# Patient Record
Sex: Female | Born: 1955 | Race: White | Hispanic: No | Marital: Single | Smoking: Former smoker
Health system: Southern US, Community
[De-identification: ages and names within clinical notes are randomized; demographics above are authoritative.]

## PROBLEM LIST (undated history)

## (undated) DIAGNOSIS — R1310 Dysphagia, unspecified: Secondary | ICD-10-CM

## (undated) DIAGNOSIS — E039 Hypothyroidism, unspecified: Secondary | ICD-10-CM

## (undated) DIAGNOSIS — K219 Gastro-esophageal reflux disease without esophagitis: Secondary | ICD-10-CM

## (undated) DIAGNOSIS — E785 Hyperlipidemia, unspecified: Secondary | ICD-10-CM

## (undated) DIAGNOSIS — G809 Cerebral palsy, unspecified: Secondary | ICD-10-CM

## (undated) DIAGNOSIS — L309 Dermatitis, unspecified: Secondary | ICD-10-CM

## (undated) DIAGNOSIS — H409 Unspecified glaucoma: Secondary | ICD-10-CM

## (undated) DIAGNOSIS — K649 Unspecified hemorrhoids: Secondary | ICD-10-CM

## (undated) DIAGNOSIS — M1990 Unspecified osteoarthritis, unspecified site: Secondary | ICD-10-CM

## (undated) DIAGNOSIS — K5790 Diverticulosis of intestine, part unspecified, without perforation or abscess without bleeding: Secondary | ICD-10-CM

## (undated) DIAGNOSIS — D649 Anemia, unspecified: Secondary | ICD-10-CM

## (undated) DIAGNOSIS — E119 Type 2 diabetes mellitus without complications: Secondary | ICD-10-CM

## (undated) DIAGNOSIS — F329 Major depressive disorder, single episode, unspecified: Secondary | ICD-10-CM

## (undated) DIAGNOSIS — R569 Unspecified convulsions: Secondary | ICD-10-CM

## (undated) DIAGNOSIS — F32A Depression, unspecified: Secondary | ICD-10-CM

## (undated) HISTORY — PX: EYE SURGERY: SHX253

## (undated) HISTORY — PX: HERNIA REPAIR: SHX51

## (undated) HISTORY — DX: Anemia, unspecified: D64.9

---

## 1997-06-29 ENCOUNTER — Other Ambulatory Visit: Admission: RE | Admit: 1997-06-29 | Discharge: 1997-06-29 | Payer: Self-pay

## 1997-07-02 ENCOUNTER — Other Ambulatory Visit: Admission: RE | Admit: 1997-07-02 | Discharge: 1997-07-02 | Payer: Self-pay

## 1999-04-01 DIAGNOSIS — R1310 Dysphagia, unspecified: Secondary | ICD-10-CM

## 1999-04-01 DIAGNOSIS — I10 Essential (primary) hypertension: Secondary | ICD-10-CM | POA: Diagnosis present

## 1999-04-01 DIAGNOSIS — H409 Unspecified glaucoma: Secondary | ICD-10-CM | POA: Insufficient documentation

## 2004-03-27 ENCOUNTER — Emergency Department: Payer: Self-pay | Admitting: Emergency Medicine

## 2004-04-01 ENCOUNTER — Emergency Department: Payer: Self-pay | Admitting: Emergency Medicine

## 2004-06-06 ENCOUNTER — Ambulatory Visit: Payer: Self-pay | Admitting: Family Medicine

## 2005-06-11 ENCOUNTER — Ambulatory Visit: Payer: Self-pay | Admitting: Family Medicine

## 2006-06-16 ENCOUNTER — Ambulatory Visit: Payer: Self-pay | Admitting: Family Medicine

## 2006-11-09 ENCOUNTER — Ambulatory Visit: Payer: Self-pay | Admitting: Gastroenterology

## 2007-06-29 ENCOUNTER — Ambulatory Visit: Payer: Self-pay | Admitting: Family Medicine

## 2008-07-03 ENCOUNTER — Ambulatory Visit: Payer: Self-pay | Admitting: Family Medicine

## 2009-07-04 ENCOUNTER — Ambulatory Visit: Payer: Self-pay

## 2009-09-02 ENCOUNTER — Emergency Department: Payer: Self-pay | Admitting: Emergency Medicine

## 2009-09-12 ENCOUNTER — Ambulatory Visit: Payer: Self-pay

## 2009-09-13 ENCOUNTER — Ambulatory Visit: Payer: Self-pay

## 2009-09-28 ENCOUNTER — Ambulatory Visit: Payer: Self-pay | Admitting: Family

## 2009-10-18 ENCOUNTER — Ambulatory Visit: Payer: Self-pay | Admitting: Gastroenterology

## 2009-12-23 ENCOUNTER — Emergency Department: Payer: Self-pay | Admitting: Emergency Medicine

## 2010-07-10 ENCOUNTER — Ambulatory Visit: Payer: Self-pay

## 2011-07-14 ENCOUNTER — Ambulatory Visit: Payer: Self-pay | Admitting: Family Medicine

## 2011-07-22 ENCOUNTER — Ambulatory Visit: Payer: Self-pay | Admitting: Family Medicine

## 2012-01-22 ENCOUNTER — Ambulatory Visit: Payer: Self-pay | Admitting: Family Medicine

## 2012-01-28 ENCOUNTER — Ambulatory Visit: Payer: Self-pay | Admitting: Gastroenterology

## 2012-02-09 ENCOUNTER — Encounter: Payer: Self-pay | Admitting: Family Medicine

## 2012-02-29 ENCOUNTER — Encounter: Payer: Self-pay | Admitting: Family Medicine

## 2012-03-31 ENCOUNTER — Encounter: Payer: Self-pay | Admitting: Family Medicine

## 2013-01-24 ENCOUNTER — Ambulatory Visit: Payer: Self-pay | Admitting: Family Medicine

## 2013-07-07 DIAGNOSIS — N950 Postmenopausal bleeding: Secondary | ICD-10-CM | POA: Insufficient documentation

## 2013-10-17 DIAGNOSIS — F32A Depression, unspecified: Secondary | ICD-10-CM | POA: Insufficient documentation

## 2013-10-17 DIAGNOSIS — K219 Gastro-esophageal reflux disease without esophagitis: Secondary | ICD-10-CM | POA: Insufficient documentation

## 2013-10-17 DIAGNOSIS — R569 Unspecified convulsions: Secondary | ICD-10-CM | POA: Insufficient documentation

## 2013-10-17 DIAGNOSIS — F329 Major depressive disorder, single episode, unspecified: Secondary | ICD-10-CM | POA: Insufficient documentation

## 2014-01-26 ENCOUNTER — Ambulatory Visit: Payer: Self-pay | Admitting: Family Medicine

## 2014-05-08 DIAGNOSIS — E039 Hypothyroidism, unspecified: Secondary | ICD-10-CM | POA: Insufficient documentation

## 2014-11-08 DIAGNOSIS — M810 Age-related osteoporosis without current pathological fracture: Secondary | ICD-10-CM | POA: Insufficient documentation

## 2014-11-08 DIAGNOSIS — G809 Cerebral palsy, unspecified: Secondary | ICD-10-CM | POA: Insufficient documentation

## 2014-12-22 ENCOUNTER — Encounter: Payer: Self-pay | Admitting: *Deleted

## 2014-12-25 ENCOUNTER — Ambulatory Visit: Payer: Medicare Other | Admitting: Anesthesiology

## 2014-12-25 ENCOUNTER — Encounter
Admission: RE | Disposition: A | Discharge status: Home / Self Care | Payer: Self-pay | Source: Ambulatory Visit | Attending: Gastroenterology

## 2014-12-25 ENCOUNTER — Ambulatory Visit
Admission: RE | Admit: 2014-12-25 | Discharge: 2014-12-25 | Disposition: A | Discharge status: Home / Self Care | Payer: Medicare Other | Source: Ambulatory Visit | Attending: Gastroenterology | Admitting: Gastroenterology

## 2014-12-25 ENCOUNTER — Encounter: Payer: Self-pay | Admitting: *Deleted

## 2014-12-25 DIAGNOSIS — K219 Gastro-esophageal reflux disease without esophagitis: Secondary | ICD-10-CM | POA: Diagnosis not present

## 2014-12-25 DIAGNOSIS — E039 Hypothyroidism, unspecified: Secondary | ICD-10-CM | POA: Insufficient documentation

## 2014-12-25 DIAGNOSIS — M810 Age-related osteoporosis without current pathological fracture: Secondary | ICD-10-CM | POA: Insufficient documentation

## 2014-12-25 DIAGNOSIS — Z791 Long term (current) use of non-steroidal anti-inflammatories (NSAID): Secondary | ICD-10-CM | POA: Diagnosis not present

## 2014-12-25 DIAGNOSIS — F329 Major depressive disorder, single episode, unspecified: Secondary | ICD-10-CM | POA: Diagnosis not present

## 2014-12-25 DIAGNOSIS — H409 Unspecified glaucoma: Secondary | ICD-10-CM | POA: Diagnosis not present

## 2014-12-25 DIAGNOSIS — Z79899 Other long term (current) drug therapy: Secondary | ICD-10-CM | POA: Diagnosis not present

## 2014-12-25 DIAGNOSIS — Z86010 Personal history of colonic polyps: Secondary | ICD-10-CM | POA: Insufficient documentation

## 2014-12-25 DIAGNOSIS — G809 Cerebral palsy, unspecified: Secondary | ICD-10-CM | POA: Insufficient documentation

## 2014-12-25 DIAGNOSIS — K5730 Diverticulosis of large intestine without perforation or abscess without bleeding: Secondary | ICD-10-CM | POA: Insufficient documentation

## 2014-12-25 DIAGNOSIS — M8580 Other specified disorders of bone density and structure, unspecified site: Secondary | ICD-10-CM | POA: Insufficient documentation

## 2014-12-25 DIAGNOSIS — K625 Hemorrhage of anus and rectum: Secondary | ICD-10-CM | POA: Insufficient documentation

## 2014-12-25 DIAGNOSIS — K648 Other hemorrhoids: Secondary | ICD-10-CM | POA: Diagnosis not present

## 2014-12-25 HISTORY — DX: Dermatitis, unspecified: L30.9

## 2014-12-25 HISTORY — DX: Depression, unspecified: F32.A

## 2014-12-25 HISTORY — DX: Unspecified glaucoma: H40.9

## 2014-12-25 HISTORY — DX: Unspecified convulsions: R56.9

## 2014-12-25 HISTORY — PX: COLONOSCOPY WITH PROPOFOL: SHX5780

## 2014-12-25 HISTORY — DX: Dysphagia, unspecified: R13.10

## 2014-12-25 HISTORY — DX: Cerebral palsy, unspecified: G80.9

## 2014-12-25 HISTORY — DX: Hypothyroidism, unspecified: E03.9

## 2014-12-25 HISTORY — DX: Major depressive disorder, single episode, unspecified: F32.9

## 2014-12-25 HISTORY — DX: Unspecified hemorrhoids: K64.9

## 2014-12-25 HISTORY — DX: Unspecified osteoarthritis, unspecified site: M19.90

## 2014-12-25 SURGERY — COLONOSCOPY WITH PROPOFOL
Anesthesia: General

## 2014-12-25 MED ORDER — SODIUM CHLORIDE 0.9 % IV SOLN
INTRAVENOUS | Status: DC
  Administered 2014-12-25: 15:00:00 via INTRAVENOUS

## 2014-12-25 MED ORDER — LIDOCAINE HCL (PF) 1 % IJ SOLN
INTRAMUSCULAR | Status: AC
  Filled 2014-12-25: qty 2

## 2014-12-25 NOTE — Anesthesia Postprocedure Evaluation (Signed)
  Anesthesia Post-op Note  Patient: Michele James  Procedure(s) Performed: Procedure(s): COLONOSCOPY WITH PROPOFOL (N/A)  Anesthesia type:General  Patient location: PACU  Post pain: Pain level controlled  Post assessment: Post-op Vital signs reviewed, Patient's Cardiovascular Status Stable, Respiratory Function Stable, Patent Airway and No signs of Nausea or vomiting  Post vital signs: Reviewed and stable  Last Vitals:  Filed Vitals:   12/25/14 1542  BP: 141/60  Pulse: 86  Temp: 36.7 C  Resp: 22    Level of consciousness: awake, alert  and patient cooperative  Complications: No apparent anesthesia complications

## 2014-12-25 NOTE — Anesthesia Preprocedure Evaluation (Addendum)
Anesthesia Evaluation  Patient identified by MRN, date of birth, ID band Patient awake    Reviewed: Allergy & Precautions, NPO status , Patient's Chart, lab work & pertinent test results  Airway Mallampati: III  TM Distance: >3 FB Neck ROM: Limited    Dental  (+) Teeth Intact   Pulmonary    Pulmonary exam normal        Cardiovascular Exercise Tolerance: Poor Normal cardiovascular exam  Cerebral palsy.   Neuro/Psych Cerebral palsy.    GI/Hepatic Hx of rectal bleeding.   Endo/Other  Hypothyroidism   Renal/GU      Musculoskeletal  (+) Arthritis , Osteoarthritis,    Abdominal   Peds  Hematology   Anesthesia Other Findings   Reproductive/Obstetrics                            Anesthesia Physical Anesthesia Plan  ASA: III  Anesthesia Plan: General   Post-op Pain Management:    Induction: Intravenous  Airway Management Planned: Nasal Cannula  Additional Equipment:   Intra-op Plan:   Post-operative Plan:   Informed Consent: I have reviewed the patients History and Physical, chart, labs and discussed the procedure including the risks, benefits and alternatives for the proposed anesthesia with the patient or authorized representative who has indicated his/her understanding and acceptance.   Consent reviewed with POA  Plan Discussed with: CRNA  Anesthesia Plan Comments:         Anesthesia Quick Evaluation

## 2014-12-25 NOTE — Op Note (Signed)
[facility] Gastroenterology Patient Name: Michele James Procedure Date: 12/25/2014 3:04 PM MRN: [id number] Account #: [id number] Date of Birth: 30-Nov-1955 Admit Type: Outpatient Age: 59 Room: [facility] ENDO ROOM 4 Gender: Female Note Status: Finalized Procedure:         Colonoscopy Indications:       Rectal bleeding, Personal history of colonic polyps Providers:         Ezzard Standing. Bluford Kaufmann, MD Referring MD:      Teena Irani. Terance Hart, MD (Referring MD) Medicines:         Monitored Anesthesia Care Complications:     No immediate complications. Procedure:         Pre-Anesthesia Assessment:                    - Prior to the procedure, a History and Physical was                     performed, and patient medications, allergies and                     sensitivities were reviewed. The patient's tolerance of                     previous anesthesia was reviewed.                    - The risks and benefits of the procedure and the sedation                     options and risks were discussed with the patient. All                     questions were answered and informed consent was obtained.                    - After reviewing the risks and benefits, the patient was                     deemed in satisfactory condition to undergo the procedure.                    After obtaining informed consent, the colonoscope was                     passed under direct vision. Throughout the procedure, the                     patient's blood pressure, pulse, and oxygen saturations                     were monitored continuously. The Colonoscope was                     introduced through the anus and advanced to the the cecum,                     identified by appendiceal orifice and ileocecal valve. The                     colonoscopy was performed with difficulty due to                     significant looping. Successful completion of the  procedure was aided by using manual pressure.  The patient                     tolerated the procedure well. The quality of the bowel                     preparation was fair. Findings:      Multiple small-mouthed diverticula were found in the sigmoid colon.      The exam was otherwise without abnormality.      The perianal exam findings include non-thrombosed internal hemorrhoids. Impression:        - Diverticulosis in the sigmoid colon.                    - The examination was otherwise normal.                    - Non-thrombosed internal hemorrhoids found on perianal                     exam.                    - No specimens collected. Recommendation:    - Discharge patient to home.                    - High fiber diet daily.                    - The findings and recommendations were discussed with the                     patient.                    - Repeat colonoscopy in 5 years for surveillance.                    - Anusol suppository prn for rectal bleeding. Procedure Code(s): --- Professional ---                    [phone], Colonoscopy, flexible; diagnostic, including                     collection of specimen(s) by brushing or washing, when                     performed (separate procedure) Diagnosis Code(s): --- Professional ---                    K64.8, Other hemorrhoids                    K62.5, Hemorrhage of anus and rectum                    Z86.010, Personal history of colonic polyps                    K57.30, Diverticulosis of large intestine without                     perforation or abscess without bleeding CPT copyright 2014 [employer] Association. All rights reserved. The codes documented in this report are preliminary and upon coder review may  be revised to meet current compliance requirements. Wallace Cullens, MD 12/25/2014 3:36:10 PM This report has been signed electronically. Number of Addenda: 0 Note  Initiated On: 12/25/2014 3:04 PM Scope Withdrawal Time: 0 hours 8 minutes 12 seconds  Total Procedure  Duration: 0 hours 22 minutes 17 seconds       [facility]

## 2014-12-25 NOTE — H&P (Signed)
  Date of Initial H&P:12/18/2014 History reviewed, patient examined, no change in status, stable for surgery. 

## 2014-12-25 NOTE — Transfer of Care (Signed)
Immediate Anesthesia Transfer of Care Note  Patient: Michele James  Procedure(s) Performed: Procedure(s): COLONOSCOPY WITH PROPOFOL (N/A)  Patient Location: PACU and Endoscopy Unit  Anesthesia Type:General  Level of Consciousness: awake, alert  and oriented  Airway & Oxygen Therapy: Patient Spontanous Breathing and Patient connected to nasal cannula oxygen  Post-op Assessment: Report given to RN and Post -op Vital signs reviewed and stable  Post vital signs: Reviewed and stable  Last Vitals:  Filed Vitals:   12/25/14 1542  BP: 141/60  Pulse: 86  Temp: 36.7 C  Resp: 22    Complications: No apparent anesthesia complications

## 2014-12-26 ENCOUNTER — Other Ambulatory Visit: Payer: Self-pay | Admitting: Family Medicine

## 2014-12-26 ENCOUNTER — Encounter: Payer: Self-pay | Admitting: Gastroenterology

## 2014-12-26 DIAGNOSIS — Z1231 Encounter for screening mammogram for malignant neoplasm of breast: Secondary | ICD-10-CM

## 2015-01-29 ENCOUNTER — Ambulatory Visit
Admission: RE | Admit: 2015-01-29 | Discharge: 2015-01-29 | Disposition: A | Discharge status: Home / Self Care | Payer: Medicare Other | Source: Ambulatory Visit | Attending: Family Medicine | Admitting: Family Medicine

## 2015-01-29 DIAGNOSIS — Z1231 Encounter for screening mammogram for malignant neoplasm of breast: Secondary | ICD-10-CM | POA: Insufficient documentation

## 2015-06-27 ENCOUNTER — Other Ambulatory Visit: Payer: Self-pay | Admitting: Obstetrics and Gynecology

## 2015-06-27 DIAGNOSIS — Z1231 Encounter for screening mammogram for malignant neoplasm of breast: Secondary | ICD-10-CM

## 2015-10-22 ENCOUNTER — Other Ambulatory Visit: Payer: Self-pay | Admitting: Gastroenterology

## 2015-10-22 DIAGNOSIS — R1011 Right upper quadrant pain: Secondary | ICD-10-CM

## 2015-10-24 ENCOUNTER — Ambulatory Visit
Admission: RE | Admit: 2015-10-24 | Discharge: 2015-10-24 | Disposition: A | Discharge status: Home / Self Care | Payer: Medicare Other | Source: Ambulatory Visit | Attending: Gastroenterology | Admitting: Gastroenterology

## 2015-10-24 DIAGNOSIS — K8020 Calculus of gallbladder without cholecystitis without obstruction: Secondary | ICD-10-CM | POA: Insufficient documentation

## 2015-10-24 DIAGNOSIS — R1011 Right upper quadrant pain: Secondary | ICD-10-CM | POA: Diagnosis present

## 2015-11-07 ENCOUNTER — Encounter: Payer: Self-pay | Admitting: *Deleted

## 2015-11-15 ENCOUNTER — Emergency Department: Payer: Medicare Other

## 2015-11-15 ENCOUNTER — Encounter: Payer: Self-pay | Admitting: Emergency Medicine

## 2015-11-15 ENCOUNTER — Inpatient Hospital Stay
Admission: EM | Admit: 2015-11-15 | Discharge: 2015-11-20 | DRG: 419 | Disposition: A | Discharge status: Home / Self Care | Payer: Medicare Other | Attending: Surgery | Admitting: Surgery

## 2015-11-15 ENCOUNTER — Inpatient Hospital Stay: Payer: Medicare Other

## 2015-11-15 DIAGNOSIS — R17 Unspecified jaundice: Secondary | ICD-10-CM

## 2015-11-15 DIAGNOSIS — G809 Cerebral palsy, unspecified: Secondary | ICD-10-CM | POA: Diagnosis present

## 2015-11-15 DIAGNOSIS — Z419 Encounter for procedure for purposes other than remedying health state, unspecified: Secondary | ICD-10-CM

## 2015-11-15 DIAGNOSIS — R1310 Dysphagia, unspecified: Secondary | ICD-10-CM | POA: Diagnosis present

## 2015-11-15 DIAGNOSIS — K8050 Calculus of bile duct without cholangitis or cholecystitis without obstruction: Secondary | ICD-10-CM

## 2015-11-15 DIAGNOSIS — K838 Other specified diseases of biliary tract: Secondary | ICD-10-CM | POA: Diagnosis not present

## 2015-11-15 DIAGNOSIS — M1990 Unspecified osteoarthritis, unspecified site: Secondary | ICD-10-CM | POA: Diagnosis present

## 2015-11-15 DIAGNOSIS — K8060 Calculus of gallbladder and bile duct with cholecystitis, unspecified, without obstruction: Secondary | ICD-10-CM | POA: Diagnosis present

## 2015-11-15 DIAGNOSIS — Z7983 Long term (current) use of bisphosphonates: Secondary | ICD-10-CM | POA: Diagnosis not present

## 2015-11-15 DIAGNOSIS — R1011 Right upper quadrant pain: Secondary | ICD-10-CM | POA: Diagnosis not present

## 2015-11-15 DIAGNOSIS — R569 Unspecified convulsions: Secondary | ICD-10-CM | POA: Diagnosis present

## 2015-11-15 DIAGNOSIS — Z888 Allergy status to other drugs, medicaments and biological substances status: Secondary | ICD-10-CM

## 2015-11-15 DIAGNOSIS — Z79899 Other long term (current) drug therapy: Secondary | ICD-10-CM | POA: Diagnosis not present

## 2015-11-15 DIAGNOSIS — K8010 Calculus of gallbladder with chronic cholecystitis without obstruction: Secondary | ICD-10-CM | POA: Diagnosis not present

## 2015-11-15 DIAGNOSIS — F329 Major depressive disorder, single episode, unspecified: Secondary | ICD-10-CM | POA: Diagnosis present

## 2015-11-15 DIAGNOSIS — K649 Unspecified hemorrhoids: Secondary | ICD-10-CM | POA: Diagnosis present

## 2015-11-15 DIAGNOSIS — Z882 Allergy status to sulfonamides status: Secondary | ICD-10-CM

## 2015-11-15 DIAGNOSIS — K439 Ventral hernia without obstruction or gangrene: Secondary | ICD-10-CM | POA: Diagnosis present

## 2015-11-15 DIAGNOSIS — R51 Headache: Secondary | ICD-10-CM | POA: Diagnosis present

## 2015-11-15 DIAGNOSIS — K8001 Calculus of gallbladder with acute cholecystitis with obstruction: Secondary | ICD-10-CM

## 2015-11-15 DIAGNOSIS — E039 Hypothyroidism, unspecified: Secondary | ICD-10-CM | POA: Diagnosis present

## 2015-11-15 LAB — CBC
HEMATOCRIT: 28.7 % — AB (ref 35.0–47.0)
Hemoglobin: 10.1 g/dL — ABNORMAL LOW (ref 12.0–16.0)
MCH: 31.9 pg (ref 26.0–34.0)
MCHC: 35.2 g/dL (ref 32.0–36.0)
MCV: 90.6 fL (ref 80.0–100.0)
PLATELETS: 321 10*3/uL (ref 150–440)
RBC: 3.17 MIL/uL — AB (ref 3.80–5.20)
RDW: 13.5 % (ref 11.5–14.5)
WBC: 8.8 10*3/uL (ref 3.6–11.0)

## 2015-11-15 LAB — CBC WITH DIFFERENTIAL/PLATELET
Basophils Absolute: 0 10*3/uL (ref 0–0.1)
Basophils Relative: 0 %
EOS PCT: 1 %
Eosinophils Absolute: 0.1 10*3/uL (ref 0–0.7)
HEMATOCRIT: 31.5 % — AB (ref 35.0–47.0)
HEMOGLOBIN: 11.2 g/dL — AB (ref 12.0–16.0)
LYMPHS ABS: 0.7 10*3/uL — AB (ref 1.0–3.6)
LYMPHS PCT: 8 %
MCH: 32 pg (ref 26.0–34.0)
MCHC: 35.5 g/dL (ref 32.0–36.0)
MCV: 90.3 fL (ref 80.0–100.0)
Monocytes Absolute: 0.9 10*3/uL (ref 0.2–0.9)
Monocytes Relative: 11 %
NEUTROS ABS: 6.9 10*3/uL — AB (ref 1.4–6.5)
Neutrophils Relative %: 80 %
Platelets: 366 10*3/uL (ref 150–440)
RBC: 3.49 MIL/uL — ABNORMAL LOW (ref 3.80–5.20)
RDW: 13.5 % (ref 11.5–14.5)
WBC: 8.6 10*3/uL (ref 3.6–11.0)

## 2015-11-15 LAB — CREATININE, SERUM: Creatinine, Ser: 0.4 mg/dL — ABNORMAL LOW (ref 0.44–1.00)

## 2015-11-15 LAB — LIPASE, BLOOD: Lipase: 23 U/L (ref 11–51)

## 2015-11-15 LAB — COMPREHENSIVE METABOLIC PANEL
ALBUMIN: 3 g/dL — AB (ref 3.5–5.0)
ALK PHOS: 197 U/L — AB (ref 38–126)
ALT: 359 U/L — ABNORMAL HIGH (ref 14–54)
ANION GAP: 9 (ref 5–15)
AST: 496 U/L — ABNORMAL HIGH (ref 15–41)
BILIRUBIN TOTAL: 2.5 mg/dL — AB (ref 0.3–1.2)
BUN: 8 mg/dL (ref 6–20)
CALCIUM: 8.4 mg/dL — AB (ref 8.9–10.3)
CO2: 27 mmol/L (ref 22–32)
Chloride: 101 mmol/L (ref 101–111)
Creatinine, Ser: 0.42 mg/dL — ABNORMAL LOW (ref 0.44–1.00)
GFR calc non Af Amer: >60 mL/min (ref >60)
GLUCOSE: 149 mg/dL — AB (ref 65–99)
POTASSIUM: 3.1 mmol/L — AB (ref 3.5–5.1)
SODIUM: 137 mmol/L (ref 135–145)
TOTAL PROTEIN: 7.3 g/dL (ref 6.5–8.1)

## 2015-11-15 LAB — SURGICAL PCR SCREEN
MRSA, PCR: NEGATIVE
Staphylococcus aureus: NEGATIVE

## 2015-11-15 MED ORDER — LEVOTHYROXINE SODIUM 125 MCG PO TABS
125.0000 ug | ORAL_TABLET | Freq: Every day | ORAL | Status: DC
  Administered 2015-11-16 – 2015-11-20 (×5): 125 ug via ORAL
  Filled 2015-11-15 (×2): qty 1
  Filled 2015-11-15: qty 2.5
  Filled 2015-11-15 (×4): qty 1

## 2015-11-15 MED ORDER — IOPAMIDOL (ISOVUE-300) INJECTION 61%
100.0000 mL | Freq: Once | INTRAVENOUS | Status: AC | PRN
Start: 2015-11-15 — End: 2015-11-15
  Administered 2015-11-15: 100 mL via INTRAVENOUS

## 2015-11-15 MED ORDER — HYDROCODONE-ACETAMINOPHEN 5-325 MG PO TABS
1.0000 | ORAL_TABLET | ORAL | Status: DC | PRN
  Administered 2015-11-15: 1 via ORAL
  Administered 2015-11-17: 2 via ORAL
  Filled 2015-11-15: qty 1
  Filled 2015-11-15: qty 2
  Filled 2015-11-15: qty 1

## 2015-11-15 MED ORDER — SODIUM CHLORIDE 0.9 % IV BOLUS (SEPSIS)
500.0000 mL | Freq: Once | INTRAVENOUS | Status: AC
  Administered 2015-11-15: 500 mL via INTRAVENOUS

## 2015-11-15 MED ORDER — KCL IN DEXTROSE-NACL 40-5-0.45 MEQ/L-%-% IV SOLN
INTRAVENOUS | Status: DC
  Administered 2015-11-15 – 2015-11-18 (×5): via INTRAVENOUS
  Administered 2015-11-18: 900 mL via INTRAVENOUS
  Administered 2015-11-19: 12:00:00 via INTRAVENOUS
  Filled 2015-11-15 (×10): qty 1000

## 2015-11-15 MED ORDER — CARBAMAZEPINE 200 MG PO TABS
ORAL_TABLET | ORAL | Status: AC
  Administered 2015-11-15: 200 mg via ORAL
  Filled 2015-11-15: qty 1

## 2015-11-15 MED ORDER — ENOXAPARIN SODIUM 40 MG/0.4ML ~~LOC~~ SOLN
40.0000 mg | SUBCUTANEOUS | Status: DC
  Administered 2015-11-15 – 2015-11-19 (×4): 40 mg via SUBCUTANEOUS
  Filled 2015-11-15 (×4): qty 0.4

## 2015-11-15 MED ORDER — LORATADINE 10 MG PO TABS
ORAL_TABLET | ORAL | Status: AC
  Administered 2015-11-15: 10 mg via ORAL
  Filled 2015-11-15: qty 1

## 2015-11-15 MED ORDER — PANTOPRAZOLE SODIUM 40 MG PO TBEC
40.0000 mg | DELAYED_RELEASE_TABLET | Freq: Two times a day (BID) | ORAL | Status: DC
  Administered 2015-11-15 – 2015-11-20 (×10): 40 mg via ORAL
  Filled 2015-11-15 (×10): qty 1

## 2015-11-15 MED ORDER — PIPERACILLIN-TAZOBACTAM 3.375 G IVPB 30 MIN
3.3750 g | Freq: Once | INTRAVENOUS | Status: AC
  Administered 2015-11-15: 3.375 g via INTRAVENOUS
  Filled 2015-11-15: qty 50

## 2015-11-15 MED ORDER — CARBAMAZEPINE 200 MG PO TABS
200.0000 mg | ORAL_TABLET | Freq: Three times a day (TID) | ORAL | Status: DC
  Administered 2015-11-15 – 2015-11-20 (×14): 200 mg via ORAL
  Filled 2015-11-15 (×13): qty 1

## 2015-11-15 MED ORDER — MORPHINE SULFATE (PF) 4 MG/ML IV SOLN
4.0000 mg | INTRAVENOUS | Status: DC | PRN
Start: 2015-11-15 — End: 2015-11-15
  Administered 2015-11-15: 4 mg via INTRAVENOUS
  Filled 2015-11-15: qty 1

## 2015-11-15 MED ORDER — ACETAMINOPHEN 500 MG PO TABS
500.0000 mg | ORAL_TABLET | Freq: Four times a day (QID) | ORAL | Status: DC | PRN
  Administered 2015-11-15: 500 mg via ORAL

## 2015-11-15 MED ORDER — PROMETHAZINE HCL 25 MG/ML IJ SOLN
12.5000 mg | Freq: Once | INTRAMUSCULAR | Status: AC
  Administered 2015-11-15: 12.5 mg via INTRAVENOUS
  Filled 2015-11-15: qty 1

## 2015-11-15 MED ORDER — LORATADINE 10 MG PO TABS
10.0000 mg | ORAL_TABLET | Freq: Every day | ORAL | Status: DC
  Administered 2015-11-15 – 2015-11-20 (×5): 10 mg via ORAL
  Filled 2015-11-15 (×4): qty 1

## 2015-11-15 MED ORDER — ALPRAZOLAM 0.25 MG PO TABS
0.2500 mg | ORAL_TABLET | Freq: Three times a day (TID) | ORAL | Status: DC | PRN
  Filled 2015-11-15: qty 1

## 2015-11-15 MED ORDER — DIATRIZOATE MEGLUMINE & SODIUM 66-10 % PO SOLN
15.0000 mL | ORAL | Status: AC
  Administered 2015-11-15 (×2): 15 mL via ORAL

## 2015-11-15 MED ORDER — PIPERACILLIN-TAZOBACTAM 3.375 G IVPB
3.3750 g | Freq: Three times a day (TID) | INTRAVENOUS | Status: DC
  Administered 2015-11-15 – 2015-11-20 (×14): 3.375 g via INTRAVENOUS
  Filled 2015-11-15 (×19): qty 50

## 2015-11-15 MED ORDER — ALENDRONATE SODIUM 70 MG PO TABS: 70.0000 mg | ORAL_TABLET | ORAL | Status: DC

## 2015-11-15 MED ORDER — ACETAMINOPHEN 500 MG PO TABS
ORAL_TABLET | ORAL | Status: AC
  Administered 2015-11-15: 500 mg via ORAL
  Filled 2015-11-15: qty 1

## 2015-11-15 MED ORDER — ONDANSETRON 4 MG PO TBDP: 4.0000 mg | ORAL_TABLET | Freq: Four times a day (QID) | ORAL | Status: DC | PRN

## 2015-11-15 MED ORDER — CALCIUM CITRATE-VITAMIN D 500-400 MG-UNIT PO CHEW
1.0000 | CHEWABLE_TABLET | Freq: Two times a day (BID) | ORAL | Status: DC
  Administered 2015-11-15 – 2015-11-20 (×10): 1 via ORAL
  Filled 2015-11-15 (×11): qty 1

## 2015-11-15 MED ORDER — PAROXETINE HCL 20 MG PO TABS
20.0000 mg | ORAL_TABLET | Freq: Every day | ORAL | Status: DC
  Administered 2015-11-15 – 2015-11-20 (×5): 20 mg via ORAL
  Filled 2015-11-15 (×5): qty 1

## 2015-11-15 MED ORDER — OLANZAPINE 5 MG PO TABS
5.0000 mg | ORAL_TABLET | Freq: Every day | ORAL | Status: DC
  Administered 2015-11-15 – 2015-11-19 (×5): 5 mg via ORAL
  Filled 2015-11-15 (×5): qty 1

## 2015-11-15 MED ORDER — MORPHINE SULFATE (PF) 4 MG/ML IV SOLN
4.0000 mg | INTRAVENOUS | Status: DC | PRN
  Administered 2015-11-16 – 2015-11-17 (×3): 4 mg via INTRAVENOUS
  Filled 2015-11-15 (×3): qty 1

## 2015-11-15 MED ORDER — ONDANSETRON HCL 4 MG/2ML IJ SOLN
4.0000 mg | Freq: Four times a day (QID) | INTRAMUSCULAR | Status: DC | PRN
  Administered 2015-11-18: 4 mg via INTRAVENOUS
  Filled 2015-11-15: qty 2

## 2015-11-15 NOTE — ED Notes (Signed)
Took pt to bathroom

## 2015-11-15 NOTE — ED Triage Notes (Addendum)
C/O RUQ pain.  Patient has had an ultrasound of gallbladder and patient states she needs to have her gallbladder removed.  Patient has been referred to Dr. Katrinka BlazingSmith, but does not have an appointment scheduled.  Patient states RUQ abdominal pain worsened yesterday and pain is worse when walking with crutches and laying on right side.  C/O intermittent nausea.  Denies vomiting.  Patient states surgery has been scheduled for September 6th.

## 2015-11-15 NOTE — ED Notes (Signed)
Patient transported to Ultrasound 

## 2015-11-15 NOTE — H&P (Signed)
Michele James is a 60 y.o. female  with gallbladder disease.  HPI: She is very pleasant woman with history of cerebral palsy. She's noticed some increasing abdominal pain and indigestion over the last several months. She was evaluated by the gastroenterology service who recommended further intervention with ultrasound. Ultrasound a month ago demonstrated gallstones without evidence of acute cholecystitis and a normal size common bile duct. She had normal liver function studies at that time. Her symptoms have increased over the last 24 hours. She recently had been set up to see the general surgeons for follow-up. She came to the emergency room because of her persistent symptoms. Levophed values currently reveal normal white blood cell count but a 2.5 and elevated liver transaminases significantly different from her previous evaluation. Gallbladder ultrasound today reveals gallbladder wall thickening consistent with possible acute cholecystitis. The surgical service was consulted.  She denies any history of significant stomach problems in the past other than abdominal wall hernia. She had her umbilicus removed and a hernia repaired when she was 60 years old. She does complain of a recurrent/persistent abdominal wall hernia. Specifically, she denies any history of hepatitis, yellow jaundice, pancreatitis, peptic ulcer disease, or diverticulitis. She's had no previous abdominal surgery other than the hernia repair. She denies history of cardiac disease hypertension or diabetes. She does have history of hypothyroidism currently on therapy. She has severe arthritis and has difficulties with her ambulation requiring crutches.  Past Medical History:  Diagnosis Date  . Arthritis   . Cerebral palsy (HCC)   . Depression   . Dysphagia   . Eczema   . Glaucoma   . Hemorrhoids   . Hypothyroidism   . Seizures (HCC)    Past Surgical History:  Procedure Laterality Date  . COLONOSCOPY WITH PROPOFOL N/A 12/25/2014    Procedure: COLONOSCOPY WITH PROPOFOL;  Surgeon: Wallace CullensPaul Y Oh, MD;  [facility];  Service: Gastroenterology;  Laterality: N/A;  . HERNIA REPAIR     Social History   Social History  . Marital status: Single    Spouse name: N/A  . Number of children: N/A  . Years of education: N/A   Social History Main Topics  . Smoking status: Never Smoker  . Smokeless tobacco: Never Used  . Alcohol use No  . Drug use: No  . Sexual activity: Not Asked   Other Topics Concern  . None   Social History Narrative  . None     Review of Systems  Constitutional: Negative for chills, fever and weight loss.  HENT: Negative for tinnitus.   Eyes: Negative.   Respiratory: Negative for cough, shortness of breath and wheezing.   Cardiovascular: Positive for chest pain and palpitations.  Gastrointestinal: Positive for abdominal pain, heartburn, nausea and vomiting. Negative for constipation and diarrhea.  Genitourinary: Negative.   Musculoskeletal: Negative.   Skin: Negative.   Neurological: Positive for focal weakness and headaches.  Psychiatric/Behavioral: Negative.      PHYSICAL EXAM: BP (!) 150/75   Pulse 91   Temp 98.9 F (37.2 C) (Oral)   Resp (!) 24   Ht 5\' 3"  (1.6 m)   Wt 75.3 kg (166 lb)   SpO2 95%   BMI 29.41 kg/m   Physical Exam  Constitutional: She is oriented to person, place, and time. She appears well-developed.  HENT:  Head: Normocephalic and atraumatic.  Eyes: EOM are normal. Pupils are equal, round, and reactive to light.  Neck: Normal range of motion. Neck supple.  Cardiovascular: Normal rate  and regular rhythm.   Pulmonary/Chest: Effort normal and breath sounds normal. No respiratory distress. She has no wheezes.  Abdominal: Soft. There is tenderness. There is guarding.  Musculoskeletal: She exhibits edema and deformity.  She has some decreased range of motion in her left arm and left leg.  Neurological: She is alert and oriented to person, place, and time.   Skin: Skin is warm and dry. She is not diaphoretic.  Psychiatric: She has a normal mood and affect. Her behavior is normal.   Her abdomen is mildly tender right upper quadrant. She appears to have a midline hernia above the side of her previous repair. She has some mild guarding but no rebound. She does have active bowel sounds.  Impression/Plan: This woman appears to have symptomatic biliary tract disease and likely has acute cholecystitis. We will start her on IV antibiotics and IV rehydration. With normal size common bile duct I doubt she has any ductal obstruction. I am concerned about this abdominal wall hernia and her history is a little vague. Equal range of motion with CT scan to evaluate the abdominal wall hernia as we may be of repair and simultaneously. We'll plan admission to hospital and possible surgical intervention tomorrow. This plans been discussed with the patient in detail and she is in agreement.   Tiney Rougealph Ely III, MD  11/15/2015, 10:57 AM

## 2015-11-15 NOTE — ED Provider Notes (Signed)
[facility] Emergency Department Provider Note    First MD Initiated Contact with Patient 11/15/15 [phone]     (approximate)  I have reviewed the triage vital signs and the nursing notes.   HISTORY  Chief Complaint Abdominal Pain    HPI Michele James is a 60 y.o. female who presents with several weeks of intermittent right upper quadrant abdominal pain. Patient describes it as a colicky cramping pain that radiates through to her right back. States that she was seen at her PCPs office today and had a right upper quadrant ultrasound was performed. The ultrasound on review of chart shows no acute abnormalities. Patient was referred to outpatient surgery but doesn't have an appointment until September 6. She denies any fevers, does have intermittent nausea but has not had any vomiting. Denies any dysuria or flank pain. No pelvic pain. As a chest pain or shortness of breath.   Past Medical History:  Diagnosis Date  . Arthritis   . Cerebral palsy (HCC)   . Depression   . Dysphagia   . Eczema   . Glaucoma   . Hemorrhoids   . Hypothyroidism   . Seizures [facility](HCC)     Patient Active Problem List   Diagnosis Date Noted  . RUQ pain     Past Surgical History:  Procedure Laterality Date  . COLONOSCOPY WITH PROPOFOL N/A 12/25/2014   Procedure: COLONOSCOPY WITH PROPOFOL;  Surgeon: Wallace CullensPaul Y Oh, MD;  [facility];  Service: Gastroenterology;  Laterality: N/A;  . HERNIA REPAIR      Prior to Admission medications   Medication Sig Start Date End Date Taking? Authorizing Provider  acetaminophen (TYLENOL) 325 MG tablet Take 325 mg by mouth every 6 (six) hours as needed.    Historical Provider, MD  alendronate (FOSAMAX) 70 MG tablet Take 70 mg by mouth once a week. Take with a full glass of water on an empty stomach.    Historical Provider, MD  ALPRAZolam Prudy Feeler(XANAX) 0.25 MG tablet Take 0.25 mg by mouth 3 (three) times daily as needed for anxiety.    Historical  Provider, MD  calcium citrate-vitamin D (CITRACAL+D) 315-200 MG-UNIT per tablet Take 1 tablet by mouth 2 (two) times daily.    Historical Provider, MD  carbamazepine (TEGRETOL) 200 MG tablet Take 200 mg by mouth 3 (three) times daily.    Historical Provider, MD  ergocalciferol (VITAMIN D2) 50000 UNITS capsule Take 50,000 Units by mouth once a week.    Historical Provider, MD  esomeprazole (NEXIUM) 40 MG capsule Take 40 mg by mouth daily at 12 noon.    Historical Provider, MD  fexofenadine (ALLEGRA) 180 MG tablet Take 180 mg by mouth daily.    Historical Provider, MD  hydrocortisone (ANUSOL-HC) 25 MG suppository Place 25 mg rectally 2 (two) times daily as needed for hemorrhoids or itching.    Historical Provider, MD  levothyroxine (SYNTHROID, LEVOTHROID) 125 MCG tablet Take 125 mcg by mouth daily before breakfast.    Historical Provider, MD  naproxen (NAPROSYN) 375 MG tablet Take 375 mg by mouth 2 (two) times daily with a meal.    Historical Provider, MD  OLANZapine (ZYPREXA) 5 MG tablet Take 5 mg by mouth at bedtime.    Historical Provider, MD  PARoxetine (PAXIL) 20 MG tablet Take 20 mg by mouth daily.    Historical Provider, MD  potassium chloride 20 MEQ/15ML (10%) SOLN Take 10 mEq by mouth daily.    Historical Provider, MD  ranitidine (ZANTAC)  300 MG tablet Take 300 mg by mouth at bedtime.    Historical Provider, MD  SUMAtriptan (IMITREX) 50 MG tablet Take 50 mg by mouth every 2 (two) hours as needed for migraine. May repeat in 2 hours if headache persists or recurs.    Historical Provider, MD  traMADol (ULTRAM) 50 MG tablet Take 50 mg by mouth every 6 (six) hours as needed. 1-2 tablets by mouth every 4-6 hours as needed for pain    Historical Provider, MD    Allergies Advil [ibuprofen]; Potassium-containing compounds; Theophyllines; Amoxicillin; Nsaids; Septra [sulfamethoxazole-trimethoprim]; and Sulfa antibiotics  Family History  Problem Relation Age of Onset  . Breast cancer Maternal Aunt    . Breast cancer Maternal Aunt     Social History Social History  Substance Use Topics  . Smoking status: Never Smoker  . Smokeless tobacco: Never Used  . Alcohol use No    Review of Systems Patient denies headaches, rhinorrhea, blurry vision, numbness, shortness of breath, chest pain, edema, cough, abdominal pain, nausea, vomiting, diarrhea, dysuria, fevers, rashes or hallucinations unless otherwise stated above in HPI. ____________________________________________   PHYSICAL EXAM:  VITAL SIGNS: Vitals:   11/15/15 1015 11/15/15 1030  BP:  (!) 150/75  Pulse: 90 91  Resp: 19 (!) 24  Temp:      Constitutional: Alert and oriented. Well appearing and in no acute distress. Eyes: Conjunctivae are normal. PERRL. EOMI. Head: Atraumatic. Nose: No congestion/rhinnorhea. Mouth/Throat: Mucous membranes are moist.  Oropharynx non-erythematous. Neck: No stridor. Painless ROM. No cervical spine tenderness to palpation Hematological/Lymphatic/Immunilogical: No cervical lymphadenopathy. Cardiovascular: Normal rate, regular rhythm. Grossly normal heart sounds.  Good peripheral circulation. Respiratory: Normal respiratory effort.  No retractions. Lungs CTAB. Gastrointestinal: Soft, mildly tender in the RUQ without guarding or rebound. No distention. No abdominal bruits. No CVA tenderness. Genitourinary:  Musculoskeletal: No lower extremity tenderness nor edema.  No joint effusions. Neurologic:  Normal speech and language. No gross focal neurologic deficits are appreciated. No gait instability. Skin:  Skin is warm, dry and intact. No rash noted. Psychiatric: Mood and affect are normal. Speech and behavior are normal.  ____________________________________________   LABS (all labs ordered are listed, but only abnormal results are displayed)  Results for orders placed or performed during the hospital encounter of 11/15/15 (from the past 24 hour(s))  CBC with Differential/Platelet      Status: Abnormal   Collection Time: 11/15/15  8:11 AM  Result Value Ref Range   WBC 8.6 3.6 - 11.0 K/uL   RBC 3.49 (L) 3.80 - 5.20 MIL/uL   Hemoglobin 11.2 (L) 12.0 - 16.0 g/dL   HCT 16.131.5 (L) 09.635.0 - 04.547.0 %   MCV 90.3 80.0 - 100.0 fL   MCH 32.0 26.0 - 34.0 pg   MCHC 35.5 32.0 - 36.0 g/dL   RDW 40.913.5 81.111.5 - 91.414.5 %   Platelets 366 150 - 440 K/uL   Neutrophils Relative % 80 %   Neutro Abs 6.9 (H) 1.4 - 6.5 K/uL   Lymphocytes Relative 8 %   Lymphs Abs 0.7 (L) 1.0 - 3.6 K/uL   Monocytes Relative 11 %   Monocytes Absolute 0.9 0.2 - 0.9 K/uL   Eosinophils Relative 1 %   Eosinophils Absolute 0.1 0 - 0.7 K/uL   Basophils Relative 0 %   Basophils Absolute 0.0 0 - 0.1 K/uL  Comprehensive metabolic panel     Status: Abnormal   Collection Time: 11/15/15  8:11 AM  Result Value Ref Range   Sodium 137  135 - 145 mmol/L   Potassium 3.1 (L) 3.5 - 5.1 mmol/L   Chloride 101 101 - 111 mmol/L   CO2 27 22 - 32 mmol/L   Glucose, Bld 149 (H) 65 - 99 mg/dL   BUN 8 6 - 20 mg/dL   Creatinine, Ser 4.69 (L) 0.44 - 1.00 mg/dL   Calcium 8.4 (L) 8.9 - 10.3 mg/dL   Total Protein 7.3 6.5 - 8.1 g/dL   Albumin 3.0 (L) 3.5 - 5.0 g/dL   AST 629 (H) 15 - 41 U/L   ALT 359 (H) 14 - 54 U/L   Alkaline Phosphatase 197 (H) 38 - 126 U/L   Total Bilirubin 2.5 (H) 0.3 - 1.2 mg/dL   GFR calc non Af Amer >60 >60 mL/min   GFR calc Af Amer >60 >60 mL/min   Anion gap 9 5 - 15  Lipase, blood     Status: None   Collection Time: 11/15/15  8:11 AM  Result Value Ref Range   Lipase 23 11 - 51 U/L   ____________________________________________   ____________________________________________  RADIOLOGY  RUS [place] with gall stones and increased gall bladder wall thickness. ____________________________________________   PROCEDURES  Procedure(s) performed: none    Critical Care performed: no ____________________________________________   INITIAL IMPRESSION / ASSESSMENT AND PLAN / ED COURSE  Pertinent labs & imaging  results that were available during my care of the patient were reviewed by me and considered in my medical decision making (see chart for details).  DDX cholelithiasis, cholecystitis, biliary colic, pancreatitis, gastritis, pneumonia, constipation  Michele James is a 60 y.o. who presents to the ED with acute on chronic right upper quadrant pain. Patient afebrile but is tender to palpation right upper quadrant. She does have a history of stones therefore given her acute presentation will repeat right upper quadrant ultrasound to evaluate for acute biliary pathology. Will obtain IV access for laboratory evaluation of hepatic function and biliary processes. We will order x-ray acute abdominal series to evaluate for obstructive process.  Clinical Course  Comment By Time  Reviewed results of x-ray imaging. Also noted ultrasound with slightly increased gallbladder wall thickening as compared to previous. No pericholecystic fluid. No fever and no leukocytosis. Willy Eddy, MD 08/17 1008  CMP does show increasing T bili with transaminitis. Ace on these levels with worsening pain and ultrasound evidence of increasing gallbladder wall thickening I paged Gen. surgery further evaluation. Spoke with Dr. Theone Murdoch who agrees to come and evaluate patient. Willy Eddy, MD 08/17 1012   ----------------------------------------- 11:36 AM on 11/15/2015 -----------------------------------------  Patient evaluate by Dr. Theone Murdoch who agrees that patient is symptomatically from her cholelithiasis and given the increasing wall thickness has likely acute cholecystitis. Plan for admission for IV antibiotics and cholecystectomy.  Have discussed with the patient and available family all diagnostics and treatments performed thus far and all questions were answered to the best of my ability. The patient demonstrates understanding and agreement with plan.   ____________________________________________   FINAL CLINICAL  IMPRESSION(S) / ED DIAGNOSES  Final diagnoses:  RUQ pain  Calculus of gallbladder with acute cholecystitis and obstruction      NEW MEDICATIONS STARTED DURING THIS VISIT:  New Prescriptions   No medications on file     Note:  This document was prepared using Dragon voice recognition software and may include unintentional dictation errors.    Willy Eddy, MD 11/15/15 1137

## 2015-11-15 NOTE — ED Notes (Signed)
Pt assisted x2 via wheelchair to the toilet and back on the stretcher and positioned for comfort..Marland Kitchen

## 2015-11-15 NOTE — ED Notes (Signed)
Assisted patient to bathroom.

## 2015-11-15 NOTE — ED Notes (Signed)
Pt returned from ultrasound

## 2015-11-15 NOTE — ED Notes (Signed)
Surgeon Dr. Michela PitcherEly at bedside.

## 2015-11-15 NOTE — ED Notes (Signed)
Pt c/o increased RUQ PAIN for the past 3 days with nausea.. Pt has known gall bladder issues and has an appointment with a surgeon on September 6th..Marland Kitchen

## 2015-11-15 NOTE — Progress Notes (Signed)

## 2015-11-16 ENCOUNTER — Inpatient Hospital Stay: Payer: Medicare Other | Admitting: Certified Registered Nurse Anesthetist

## 2015-11-16 ENCOUNTER — Encounter: Payer: Self-pay | Admitting: Anesthesiology

## 2015-11-16 ENCOUNTER — Inpatient Hospital Stay: Payer: Medicare Other

## 2015-11-16 ENCOUNTER — Encounter
Admission: EM | Disposition: A | Discharge status: Home / Self Care | Payer: Self-pay | Source: Home / Self Care | Attending: Surgery

## 2015-11-16 DIAGNOSIS — K8010 Calculus of gallbladder with chronic cholecystitis without obstruction: Secondary | ICD-10-CM

## 2015-11-16 HISTORY — PX: CHOLECYSTECTOMY: SHX55

## 2015-11-16 LAB — COMPREHENSIVE METABOLIC PANEL
ALK PHOS: 188 U/L — AB (ref 38–126)
ALT: 302 U/L — AB (ref 14–54)
AST: 233 U/L — AB (ref 15–41)
Albumin: 2.8 g/dL — ABNORMAL LOW (ref 3.5–5.0)
Anion gap: 6 (ref 5–15)
BUN: 5 mg/dL — AB (ref 6–20)
CALCIUM: 8.5 mg/dL — AB (ref 8.9–10.3)
CHLORIDE: 102 mmol/L (ref 101–111)
CO2: 28 mmol/L (ref 22–32)
CREATININE: 0.33 mg/dL — AB (ref 0.44–1.00)
GFR calc Af Amer: >60 mL/min (ref >60)
Glucose, Bld: 148 mg/dL — ABNORMAL HIGH (ref 65–99)
Potassium: 3 mmol/L — ABNORMAL LOW (ref 3.5–5.1)
Sodium: 136 mmol/L (ref 135–145)
Total Bilirubin: 4 mg/dL — ABNORMAL HIGH (ref 0.3–1.2)
Total Protein: 6.8 g/dL (ref 6.5–8.1)

## 2015-11-16 LAB — CBC
HCT: 31.2 % — ABNORMAL LOW (ref 35.0–47.0)
Hemoglobin: 10.8 g/dL — ABNORMAL LOW (ref 12.0–16.0)
MCH: 31.6 pg (ref 26.0–34.0)
MCHC: 34.5 g/dL (ref 32.0–36.0)
MCV: 91.5 fL (ref 80.0–100.0)
PLATELETS: 368 10*3/uL (ref 150–440)
RBC: 3.41 MIL/uL — ABNORMAL LOW (ref 3.80–5.20)
RDW: 13.3 % (ref 11.5–14.5)
WBC: 8.9 10*3/uL (ref 3.6–11.0)

## 2015-11-16 LAB — POTASSIUM: Potassium: 3.6 mmol/L (ref 3.5–5.1)

## 2015-11-16 SURGERY — LAPAROSCOPIC CHOLECYSTECTOMY
Anesthesia: General | Wound class: Clean Contaminated

## 2015-11-16 MED ORDER — POTASSIUM CHLORIDE 10 MEQ/100ML IV SOLN
10.0000 meq | INTRAVENOUS | Status: DC
  Administered 2015-11-16 (×3): 10 meq via INTRAVENOUS
  Filled 2015-11-16 (×4): qty 100

## 2015-11-16 MED ORDER — HEPARIN SODIUM (PORCINE) 5000 UNIT/ML IJ SOLN
INTRAMUSCULAR | Status: AC
  Filled 2015-11-16: qty 1

## 2015-11-16 MED ORDER — ACETAMINOPHEN 10 MG/ML IV SOLN
INTRAVENOUS | Status: DC | PRN
  Administered 2015-11-16: 1000 mg via INTRAVENOUS

## 2015-11-16 MED ORDER — ONDANSETRON HCL 4 MG/2ML IJ SOLN
INTRAMUSCULAR | Status: DC | PRN
  Administered 2015-11-16: 4 mg via INTRAVENOUS

## 2015-11-16 MED ORDER — ACETAMINOPHEN 10 MG/ML IV SOLN
INTRAVENOUS | Status: AC
  Filled 2015-11-16: qty 100

## 2015-11-16 MED ORDER — SODIUM CHLORIDE 0.9 % IJ SOLN
INTRAMUSCULAR | Status: AC
  Filled 2015-11-16: qty 50

## 2015-11-16 MED ORDER — SODIUM CHLORIDE 0.9 % IR SOLN
Status: DC | PRN
  Administered 2015-11-16: 1000 mL

## 2015-11-16 MED ORDER — FENTANYL CITRATE (PF) 100 MCG/2ML IJ SOLN
INTRAMUSCULAR | Status: DC | PRN
  Administered 2015-11-16 (×2): 50 ug via INTRAVENOUS

## 2015-11-16 MED ORDER — ONDANSETRON HCL 4 MG/2ML IJ SOLN: 4.0000 mg | Freq: Once | INTRAMUSCULAR | Status: DC | PRN

## 2015-11-16 MED ORDER — DEXAMETHASONE SODIUM PHOSPHATE 10 MG/ML IJ SOLN
INTRAMUSCULAR | Status: DC | PRN
  Administered 2015-11-16: 10 mg via INTRAVENOUS

## 2015-11-16 MED ORDER — SUGAMMADEX SODIUM 200 MG/2ML IV SOLN
INTRAVENOUS | Status: DC | PRN
  Administered 2015-11-16: 160 mg via INTRAVENOUS

## 2015-11-16 MED ORDER — PROPOFOL 10 MG/ML IV BOLUS
INTRAVENOUS | Status: DC | PRN
  Administered 2015-11-16: 150 mg via INTRAVENOUS

## 2015-11-16 MED ORDER — PHENYLEPHRINE HCL 10 MG/ML IJ SOLN
INTRAMUSCULAR | Status: DC | PRN
  Administered 2015-11-16: 100 ug via INTRAVENOUS
  Administered 2015-11-16: 200 ug via INTRAVENOUS
  Administered 2015-11-16 (×3): 100 ug via INTRAVENOUS

## 2015-11-16 MED ORDER — BUPIVACAINE HCL (PF) 0.25 % IJ SOLN
INTRAMUSCULAR | Status: AC
  Filled 2015-11-16: qty 30

## 2015-11-16 MED ORDER — LACTATED RINGERS IV SOLN
INTRAVENOUS | Status: DC | PRN
  Administered 2015-11-16: 16:00:00 via INTRAVENOUS

## 2015-11-16 MED ORDER — LIDOCAINE HCL (CARDIAC) 20 MG/ML IV SOLN
INTRAVENOUS | Status: DC | PRN
  Administered 2015-11-16: 50 mg via INTRAVENOUS

## 2015-11-16 MED ORDER — MIDAZOLAM HCL 2 MG/2ML IJ SOLN
INTRAMUSCULAR | Status: DC | PRN
  Administered 2015-11-16: 1 mg via INTRAVENOUS

## 2015-11-16 MED ORDER — ROCURONIUM BROMIDE 100 MG/10ML IV SOLN
INTRAVENOUS | Status: DC | PRN
  Administered 2015-11-16 (×2): 20 mg via INTRAVENOUS

## 2015-11-16 MED ORDER — BUPIVACAINE HCL (PF) 0.25 % IJ SOLN
INTRAMUSCULAR | Status: DC | PRN
  Administered 2015-11-16: 30 mL

## 2015-11-16 MED ORDER — FENTANYL CITRATE (PF) 100 MCG/2ML IJ SOLN
INTRAMUSCULAR | Status: AC
  Administered 2015-11-16: 25 ug via INTRAVENOUS
  Filled 2015-11-16: qty 2

## 2015-11-16 MED ORDER — FENTANYL CITRATE (PF) 100 MCG/2ML IJ SOLN
25.0000 ug | INTRAMUSCULAR | Status: DC | PRN
  Administered 2015-11-16 (×2): 25 ug via INTRAVENOUS

## 2015-11-16 MED ORDER — SODIUM CHLORIDE 0.9 % IV SOLN
INTRAVENOUS | Status: DC | PRN
  Administered 2015-11-16: 10 mL

## 2015-11-16 MED ORDER — SUCCINYLCHOLINE CHLORIDE 20 MG/ML IJ SOLN
INTRAMUSCULAR | Status: DC | PRN
  Administered 2015-11-16: 100 mg via INTRAVENOUS

## 2015-11-16 SURGICAL SUPPLY — 57 items
APPLIER CLIP ROT 10 11.4 M/L (STAPLE) ×3
BAG COUNTER SPONGE EZ (MISCELLANEOUS) IMPLANT
BULB RESERV EVAC DRAIN JP 100C (MISCELLANEOUS) ×3 IMPLANT
CANISTER SUCT 1200ML W/VALVE (MISCELLANEOUS) ×3 IMPLANT
CANISTER SUCT 3000ML PPV (MISCELLANEOUS) ×3 IMPLANT
CATH REDDICK CHOLANGI 4FR 50CM (CATHETERS) ×3 IMPLANT
CHLORAPREP W/TINT 26ML (MISCELLANEOUS) ×3 IMPLANT
CLIP APPLIE ROT 10 11.4 M/L (STAPLE) ×1 IMPLANT
CONRAY 60ML FOR OR (MISCELLANEOUS) ×3 IMPLANT
COUNTER SPONGE BAG EZ (MISCELLANEOUS)
CUTTER FLEX LINEAR 45M (STAPLE) ×3 IMPLANT
DRAIN CHANNEL JP 19F (MISCELLANEOUS) ×3 IMPLANT
DRAPE SHEET LG 3/4 BI-LAMINATE (DRAPES) ×3 IMPLANT
DRAPE STERI IOBAN 125X83 (DRAPES) ×3 IMPLANT
DRSG TEGADERM 2-3/8X2-3/4 SM (GAUZE/BANDAGES/DRESSINGS) ×12 IMPLANT
DRSG TELFA 3X8 NADH (GAUZE/BANDAGES/DRESSINGS) ×3 IMPLANT
ELECT REM PT RETURN 9FT ADLT (ELECTROSURGICAL) ×3
ELECTRODE REM PT RTRN 9FT ADLT (ELECTROSURGICAL) ×1 IMPLANT
GLOVE BIO SURGEON STRL SZ 6.5 (GLOVE) ×2 IMPLANT
GLOVE BIO SURGEON STRL SZ7.5 (GLOVE) ×12 IMPLANT
GLOVE BIO SURGEONS STRL SZ 6.5 (GLOVE) ×1
GLOVE BIOGEL PI IND STRL 7.0 (GLOVE) ×2 IMPLANT
GLOVE BIOGEL PI INDICATOR 7.0 (GLOVE) ×4
GLOVE INDICATOR 7.0 STRL GRN (GLOVE) ×3 IMPLANT
GLOVE INDICATOR 8.0 STRL GRN (GLOVE) ×3 IMPLANT
GOWN STRL REUS W/ TWL LRG LVL3 (GOWN DISPOSABLE) ×4 IMPLANT
GOWN STRL REUS W/TWL LRG LVL3 (GOWN DISPOSABLE) ×8
GRASPER SUT TROCAR 14GX15 (MISCELLANEOUS) ×3 IMPLANT
IRRIGATION STRYKERFLOW (MISCELLANEOUS) ×1 IMPLANT
IRRIGATOR STRYKERFLOW (MISCELLANEOUS) ×3
IV NS 1000ML (IV SOLUTION) ×2
IV NS 1000ML BAXH (IV SOLUTION) ×1 IMPLANT
LABEL OR SOLS (LABEL) ×3 IMPLANT
NDL SAFETY 18GX1.5 (NEEDLE) ×3 IMPLANT
NEEDLE HYPO 25X1 1.5 SAFETY (NEEDLE) ×3 IMPLANT
NEEDLE INSUFFLATION 14GA 120MM (NEEDLE) ×3 IMPLANT
NS IRRIG 500ML POUR BTL (IV SOLUTION) ×3 IMPLANT
PACK LAP CHOLECYSTECTOMY (MISCELLANEOUS) ×3 IMPLANT
POUCH ENDO CATCH 10MM SPEC (MISCELLANEOUS) IMPLANT
RELOAD STAPLE TA45 3.5 REG BLU (ENDOMECHANICALS) ×3 IMPLANT
SCISSORS METZENBAUM CVD 33 (INSTRUMENTS) ×3 IMPLANT
SEAL FOR SCOPE WARMER C3101 (MISCELLANEOUS) IMPLANT
SLEEVE ADV FIXATION 5X100MM (TROCAR) ×3 IMPLANT
SUT ETHILON 3-0 FS-10 30 BLK (SUTURE) ×6
SUT ETHILON 5-0 FS-2 18 BLK (SUTURE) ×3 IMPLANT
SUT VIC AB 0 CT1 36 (SUTURE) ×3 IMPLANT
SUT VIC AB 0 CT2 27 (SUTURE) ×3 IMPLANT
SUTURE EHLN 3-0 FS-10 30 BLK (SUTURE) ×2 IMPLANT
SYR 3ML LL SCALE MARK (SYRINGE) ×3 IMPLANT
SYRINGE 10CC LL (SYRINGE) ×3 IMPLANT
TED STOCKINGS ×3 IMPLANT
TROCAR XCEL 12X100 BLDLESS (ENDOMECHANICALS) ×3 IMPLANT
TROCAR Z-THREAD FIOS 11X100 BL (TROCAR) ×3 IMPLANT
TROCAR Z-THREAD OPTICAL 5X100M (TROCAR) ×6 IMPLANT
TROCAR Z-THREAD SLEEVE 11X100 (TROCAR) ×3 IMPLANT
TUBING INSUFFLATOR HI FLOW (MISCELLANEOUS) ×3 IMPLANT
WATER STERILE IRR 1000ML POUR (IV SOLUTION) IMPLANT

## 2015-11-16 NOTE — Progress Notes (Signed)
Subjective:   She continues to have abdominal pain but overall is feeling better. She had low grade temperature last evening. She has not nauseated or having any vomiting.  Her CT scan demonstrates dilated common bile duct down to the duodenum with a markedly inflamed gallbladder. She does have stones. The CT demonstrates that her hernias fat filled and midepigastric. There is no evidence of any bowel in the defect.  Vital signs in last 24 hours: Temp:  [98.4 F (36.9 C)-101.3 F (38.5 C)] 98.4 F (36.9 C) (08/18 0405) Pulse Rate:  [90-98] 93 (08/18 0444) Resp:  [15-24] 20 (08/18 0405) BP: (130-165)/(63-88) 161/88 (08/18 0405) SpO2:  [81 %-99 %] 93 % (08/18 0444) Weight:  [75.3 kg (166 lb)-79.8 kg (176 lb)] 79.8 kg (176 lb) (08/17 1521) Last BM Date: 11/15/15  Intake/Output from previous day: 08/17 0701 - 08/18 0700 In: 1030.5 [P.O.:240; I.V.:790.5] Out: 500 [Urine:500]  Exam:  Her exam is unchanged with marked upper quadrant abdominal pain. She has mild guarding but no rebound. She still has active bowel sounds. Her lungs are clear.  Lab Results:  CBC  Recent Labs  11/15/15 1558 11/16/15 0434  WBC 8.8 8.9  HGB 10.1* 10.8*  HCT 28.7* 31.2*  PLT 321 368   CMP     Component Value Date/Time   NA 136 11/16/2015 0434   K 3.0 (L) 11/16/2015 0434   CL 102 11/16/2015 0434   CO2 28 11/16/2015 0434   GLUCOSE 148 (H) 11/16/2015 0434   BUN 5 (L) 11/16/2015 0434   CREATININE 0.33 (L) 11/16/2015 0434   CALCIUM 8.5 (L) 11/16/2015 0434   PROT 6.8 11/16/2015 0434   ALBUMIN 2.8 (L) 11/16/2015 0434   AST 233 (H) 11/16/2015 0434   ALT 302 (H) 11/16/2015 0434   ALKPHOS 188 (H) 11/16/2015 0434   BILITOT 4.0 (H) 11/16/2015 0434   GFRNONAA >60 11/16/2015 0434   GFRAA >60 11/16/2015 0434   PT/INR No results for input(s): LABPROT, INR in the last 72 hours.  Studies/Results: Ct Abdomen Pelvis W Contrast  Addendum Date: 11/15/2015   ADDENDUM REPORT: 11/15/2015 17:54 ADDENDUM: These  results were called by telephone at the time of interpretation on 11/15/2015 at 5:50 pm to Dr. Tiney RougeALPH ELY III , who verbally acknowledged these results. Electronically Signed   By: Bary RichardStan  Maynard M.D.   On: 11/15/2015 17:54   Result Date: 11/15/2015 CLINICAL DATA:  Several weeks of intermittent right upper quadrant abdominal pain. EXAM: CT ABDOMEN AND PELVIS WITH CONTRAST TECHNIQUE: Multidetector CT imaging of the abdomen and pelvis was performed using the standard protocol following bolus administration of intravenous contrast. CONTRAST:  100mL ISOVUE-300 IOPAMIDOL (ISOVUE-300) INJECTION 61% COMPARISON:  None. FINDINGS: Lower chest: Small patchy consolidations at each lung base, right greater than left, most likely atelectasis. Hepatobiliary: Gallbladder walls are markedly thickened. Associated mild pericholecystic inflammation. Common bile duct distended to approximately 12 mm. No obstructing mass or stones seen at the level of the pancreatic head. Intrahepatic bile duct dilatation. Liver otherwise unremarkable. No focal mass or lesion within the liver. Pancreas: No mass, inflammatory changes, or other significant abnormality. No pancreatic duct dilatation. Spleen: Within normal limits in size and appearance. Adrenals/Urinary Tract: Adrenal glands appear normal. Kidneys appear normal without mass, stone or hydronephrosis. No ureteral or bladder calculi identified. Bladder appears normal. Stomach/Bowel: Bowel is normal in caliber. No bowel wall thickening or evidence of bowel wall inflammation. Appendix is normal. Minimal diverticulosis noted in the sigmoid colon without evidence of acute diverticulitis. Small  hiatal hernia.  Stomach appears otherwise normal. Vascular/Lymphatic: No pathologically enlarged lymph nodes. No evidence of abdominal aortic aneurysm. Reproductive: No mass or other significant abnormality. Other: No free fluid or abscess collection seen. No free intraperitoneal air. Musculoskeletal: Mild  degenerative change within the scoliotic thoracolumbar spine. No acute or suspicious osseous finding. Midline diastases rectus with focal periumbilical abdominal wall hernia that contains fat only. Superficial soft tissues otherwise unremarkable. IMPRESSION: 1. Marked thickening of the walls of the gallbladder with associated pericholecystic inflammation indicating an acute cholecystitis. Common bile duct dilatation to approximately 12 mm is likely commensurate to the adjacent cholecystitis. No obstructing stone or mass seen within the CBD or adjacent pancreatic head, but confirmation by ERCP likely indicated. 2. Additional chronic/incidental findings detailed above. Electronically Signed: By: Bary RichardStan  Maynard M.D. On: 11/15/2015 17:49   Dg Abdomen Acute W/chest  Result Date: 11/15/2015 CLINICAL DATA:  Nausea, gallbladder issues EXAM: DG ABDOMEN ACUTE W/ 1V CHEST COMPARISON:  09/21/2015 FINDINGS: There is normal small bowel gas pattern. Some colonic stool and gas noted in right colon and transverse colon. Mild levoscoliosis lumbar spine. Cardiomediastinal silhouette is unremarkable. Mild elevation of the right hemidiaphragm. No infiltrate or pulmonary edema. No evidence of free abdominal air. IMPRESSION: Normal small bowel gas pattern. No evidence of free abdominal air. Some colonic stool and gas noted in proximal colon. No acute disease within chest. Electronically Signed   By: Natasha MeadLiviu  Pop M.D.   On: 11/15/2015 09:54   [place] Abdomen Limited Ruq  Result Date: 11/15/2015 CLINICAL DATA:  RIGHT upper quadrant pain for 3 days. EXAM: US ABDOMEN LIMITED - RIGHT UPPER QUADRANT COMPARISON:  10/24/2015. FINDINGS: Gallbladder: Multiple gallstones. Several layer in the dependent portion of the gallbladder. Increased wall thickness up to 8 mm. Negative sonographic Murphy's. Approximate largest stone diameter 8 mm. Common bile duct: Diameter: Normal caliber, 4 mm. Liver: No focal lesion identified.  Increased echogenicity.  IMPRESSION: Cholelithiasis. Increasing gallbladder wall thickness up to 8 mm compared with previous scan 3 weeks ago. Negative sonographic Murphy's sign. Increased hepatic echogenicity consistent with steatosis. Electronically Signed   By: [name] StainJohn T Curnes M.D.   On: 11/15/2015 09:26    Assessment/Plan: She has a very severe cholecystitis and possible ductal obstruction. We do not have ERCP available today. Her bilirubin is up to 4. Because of the severity of her cholecystitis the elevated bilirubin could easily be from compression of her biliary system from the gallbladder. We will proceed with laparoscopic cholecystectomy possible open cholecystectomy today. We will hope to do a cholangiogram at surgery she continues to have evidence of ductal obstruction proceed with ERCP. However, the severity of her biliary tract disease on CT and ultrasound suggested cholecystitis is the primary issue here. I discussed this plan with the patient in detail and she is in agreement.

## 2015-11-16 NOTE — Anesthesia Procedure Notes (Signed)
Procedure Name: Intubation Date/Time: 11/16/2015 4:03 PM Performed by: Ginger CarneMICHELET, Ndia Sampath Pre-anesthesia Checklist: Patient identified, Emergency Drugs available, Suction available, Patient being monitored and Timeout performed Patient Re-evaluated:Patient Re-evaluated prior to inductionOxygen Delivery Method: Circle system utilized Preoxygenation: Pre-oxygenation with 100% oxygen Intubation Type: IV induction Ventilation: Two handed mask ventilation required and Oral airway inserted - appropriate to patient size Laryngoscope Size: Hyacinth MeekerMiller and 2 Grade View: Grade I Tube type: Oral Tube size: 7.0 mm Number of attempts: 1 Airway Equipment and Method: Stylet Placement Confirmation: ETT inserted through vocal cords under direct vision,  positive ETCO2 and breath sounds checked- equal and bilateral Secured at: 20 cm Tube secured with: Tape Dental Injury: Teeth and Oropharynx as per pre-operative assessment

## 2015-11-16 NOTE — Transfer of Care (Signed)
Immediate Anesthesia Transfer of Care Note  Patient: Michele James  Procedure(s) Performed: Procedure(s) with comments: LAPAROSCOPIC CHOLECYSTECTOMY (N/A) - attempted cholangiogram  Patient Location: PACU  Anesthesia Type:General  Level of Consciousness: awake, alert  and oriented  Airway & Oxygen Therapy: Patient connected to nasal cannula oxygen  Post-op Assessment: Post -op Vital signs reviewed and stable  Post vital signs: stable  Last Vitals:  Vitals:   11/16/15 1328 11/16/15 1753  BP: (!) 156/73   Pulse: 94 (P) 98  Resp: 20   Temp: 36.9 C (P) 37.2 C    Last Pain:  Vitals:   11/16/15 1328  TempSrc: Oral  PainSc:       Patients Stated Pain Goal: 3 (11/16/15 0745)  Complications: No apparent anesthesia complications

## 2015-11-16 NOTE — Progress Notes (Signed)
11/16/2015 15:00  Pt com,plained of pain and not being to tolerate TED hose.  Pt sent to OR with SCDs for VTE prophylaxis.  Bradly Chrisougherty, Rosanna Bickle E, RN

## 2015-11-16 NOTE — Op Note (Signed)
11/15/2015 - 11/16/2015  5:48 PM  PATIENT:  Michele JumperKaren E Wyly  60 y.o. female  PRE-OPERATIVE DIAGNOSIS:  cholecystitis, cholelithiasi  POST-OPERATIVE DIAGNOSIS:  cholecystitis ,cholilithiasis  PROCEDURE:  Procedure(s) with comments: LAPAROSCOPIC CHOLECYSTECTOMY (N/A) - attempted cholangiogram  SURGEON:  Surgeon(s) and Role:    * Tiney Rougealph Ely III, MD - Primary   ASSISTANTS: none   ANESTHESIA:   general  EBL:  Total I/O In: 802 [I.V.:802] Out: 350 [Urine:250; Blood:100]   DRAINS: (1) Jackson-Pratt drain(s) with closed bulb suction in the gall bladder fossa   LOCAL MEDICATIONS USED:  MARCAINE      DISPOSITION OF SPECIMEN:  PATHOLOGY   DICTATION: .Dragon Dictation with the patient supine position and after induction of appropriate general anesthesia the patient's abdomen was prepped ChloraPrep and draped sterile towels. The patient was placed headdown feet up position. Her umbilicus previously resected with the hernia repair so a right lateral incision was made just off the previous umbilical area incision. The abdomen was cannulated using an Opti-Vu apparatus without difficulty. CO2 was then insufflated.  The patient was placed in head up feet down position rotated slightly to the left side. Subxiphoid transverse incision was made 11 mm port inserted under direct vision. 2 lateral ports 5 mm in size were inserted under direct vision. The omentum was densely adhered over the edge of the liver gallbladder was markedly distended chronically inflamed thickened edematous. Proximally 100 cc of clear bile was removed from the gallbladder and near the end of the aspiration did appear to be some purulent bile. Tedious dissection was required to remove the omentum and right upper quadrant structures from the gallbladder. Gallbladder was retracted superiorly and laterally.  Dissection was carefully undertaken along the hepatoduodenal ligament. Cystic duct common duct were identified. Cystic duct was  quite large but very short. A small incision was made in the duct and attempt was made at cholangiography. The catheter could not be advanced. Cholangiography was unsuccessful and therefore abandoned. Using that small incision large amount of purulent bile was evacuated from the cystic and common ducts. The common duct was not distended at the conclusion of that procedure.  Because of the width of the cystic duct I elected to divided with the stapling device. The upper midline port was exchanged for a 12 mm port and the Endo GIA stapler used to divide the cystic duct. A blue load was used last.  Cystic artery was identified doubly clipped and divided. The gallbladder was then dissected free from its bed in the liver using hook and cautery apparatus. Once it was free was captured Endo Catch apparatus. There was removed through the subxiphoid incision enlarging the incision to accommodate the removal. The anus and copiously suction irrigated with 2 L of warm saline solution.  A 19 [place] Blake drain was inserted through the upper incision brought out through one of the lateral stab wounds and placed in the bed of the liver. It was secured with 3-0 nylon. Penrose desufflated without difficulty. The upper midline fascia was closed with figure-of-eight sutures of 0 Vicryl. The skin was closed with 3 and 5-0 nylon sterile dressings were applied. The patient returned recovery room having tolerated the procedure well. Sponge instrument and needle count were correct 2 in the operating room.  PLAN OF CARE: Admit to inpatient   PATIENT DISPOSITION:  PACU - hemodynamically stable.   Tiney Rougealph Ely III, MD

## 2015-11-16 NOTE — Anesthesia Preprocedure Evaluation (Signed)
Anesthesia Evaluation  Patient identified by MRN, date of birth, ID band Patient awake    Reviewed: Allergy & Precautions, NPO status , Patient's Chart, lab work & pertinent test results  Airway Mallampati: III  TM Distance: >3 FB Neck ROM: Limited    Dental  (+) Teeth Intact   Pulmonary neg pulmonary ROS,    Pulmonary exam normal        Cardiovascular Exercise Tolerance: Poor negative cardio ROS Normal cardiovascular exam  Cerebral palsy.   Neuro/Psych Seizures -, Well Controlled,  PSYCHIATRIC DISORDERS Depression CP  Neuromuscular disease    GI/Hepatic Neg liver ROS, Cholecystitis Hemorrhoids dysphagia   Endo/Other  Hypothyroidism   Renal/GU negative Renal ROS  negative genitourinary   Musculoskeletal  (+) Arthritis , Osteoarthritis,    Abdominal Normal abdominal exam  (+)   Peds CP   Hematology negative hematology ROS (+)   Anesthesia Other Findings   Reproductive/Obstetrics                             Anesthesia Physical  Anesthesia Plan  ASA: III  Anesthesia Plan: General   Post-op Pain Management:    Induction: Intravenous  Airway Management Planned: Oral ETT  Additional Equipment:   Intra-op Plan:   Post-operative Plan: Extubation in OR  Informed Consent: I have reviewed the patients History and Physical, chart, labs and discussed the procedure including the risks, benefits and alternatives for the proposed anesthesia with the patient or authorized representative who has indicated his/her understanding and acceptance.   Consent reviewed with POA and Dental advisory given  Plan Discussed with: CRNA and Surgeon  Anesthesia Plan Comments:         Anesthesia Quick Evaluation

## 2015-11-17 DIAGNOSIS — K838 Other specified diseases of biliary tract: Secondary | ICD-10-CM

## 2015-11-17 DIAGNOSIS — R17 Unspecified jaundice: Secondary | ICD-10-CM

## 2015-11-17 LAB — COMPREHENSIVE METABOLIC PANEL
ALBUMIN: 2.5 g/dL — AB (ref 3.5–5.0)
ALK PHOS: 168 U/L — AB (ref 38–126)
ALT: 208 U/L — ABNORMAL HIGH (ref 14–54)
ANION GAP: 7 (ref 5–15)
AST: 113 U/L — AB (ref 15–41)
BILIRUBIN TOTAL: 4.7 mg/dL — AB (ref 0.3–1.2)
BUN: 7 mg/dL (ref 6–20)
CALCIUM: 7.9 mg/dL — AB (ref 8.9–10.3)
CO2: 24 mmol/L (ref 22–32)
Chloride: 105 mmol/L (ref 101–111)
Creatinine, Ser: 0.31 mg/dL — ABNORMAL LOW (ref 0.44–1.00)
GFR calc Af Amer: >60 mL/min (ref >60)
GLUCOSE: 134 mg/dL — AB (ref 65–99)
Potassium: 3.6 mmol/L (ref 3.5–5.1)
Sodium: 136 mmol/L (ref 135–145)
TOTAL PROTEIN: 6.2 g/dL — AB (ref 6.5–8.1)

## 2015-11-17 LAB — CBC
HEMATOCRIT: 29.7 % — AB (ref 35.0–47.0)
HEMOGLOBIN: 10.2 g/dL — AB (ref 12.0–16.0)
MCH: 31.6 pg (ref 26.0–34.0)
MCHC: 34.4 g/dL (ref 32.0–36.0)
MCV: 91.8 fL (ref 80.0–100.0)
Platelets: 351 10*3/uL (ref 150–440)
RBC: 3.23 MIL/uL — ABNORMAL LOW (ref 3.80–5.20)
RDW: 13.5 % (ref 11.5–14.5)
WBC: 12.1 10*3/uL — ABNORMAL HIGH (ref 3.6–11.0)

## 2015-11-17 LAB — LIPASE, BLOOD: LIPASE: 13 U/L (ref 11–51)

## 2015-11-17 NOTE — Progress Notes (Signed)
Subjective:   She feels better today with less abdominal discomfort. She's not nauseated. She has some mild incisional pain. She was not febrile over the course of the evening. Her bilirubin remains elevated at 4.7. She has low-grade white blood cell count.  Vital signs in last 24 hours: Temp:  [98.3 F (36.8 C)-99 F (37.2 C)] 98.6 F (37 C) (08/19 0502) Pulse Rate:  [89-102] 102 (08/19 0502) Resp:  [15-24] 19 (08/19 0502) BP: (137-157)/(54-80) 137/76 (08/19 0502) SpO2:  [89 %-96 %] 92 % (08/19 0502) Last BM Date: 11/15/15  Intake/Output from previous day: 08/18 0701 - 08/19 0700 In: 1077 [I.V.:1027; IV Piggyback:50] Out: 1185 [Urine:960; Drains:125; Blood:100]  Exam:  Her abdomen is soft. The drainage from her JP drain is serosanguineous. She does have hypoactive bowel sounds.  Lab Results:  CBC  Recent Labs  11/16/15 0434 11/17/15 0424  WBC 8.9 12.1*  HGB 10.8* 10.2*  HCT 31.2* 29.7*  PLT 368 351   CMP     Component Value Date/Time   NA 136 11/17/2015 0424   K 3.6 11/17/2015 0424   CL 105 11/17/2015 0424   CO2 24 11/17/2015 0424   GLUCOSE 134 (H) 11/17/2015 0424   BUN 7 11/17/2015 0424   CREATININE 0.31 (L) 11/17/2015 0424   CALCIUM 7.9 (L) 11/17/2015 0424   PROT 6.2 (L) 11/17/2015 0424   ALBUMIN 2.5 (L) 11/17/2015 0424   AST 113 (H) 11/17/2015 0424   ALT 208 (H) 11/17/2015 0424   ALKPHOS 168 (H) 11/17/2015 0424   BILITOT 4.7 (H) 11/17/2015 0424   GFRNONAA >60 11/17/2015 0424   GFRAA >60 11/17/2015 0424   PT/INR No results for input(s): LABPROT, INR in the last 72 hours.  Studies/Results: Ct Abdomen Pelvis W Contrast  Addendum Date: 11/15/2015   ADDENDUM REPORT: 11/15/2015 17:54 ADDENDUM: These results were called by telephone at the time of interpretation on 11/15/2015 at 5:50 pm to Dr. Tiney RougeALPH ELY III , who verbally acknowledged these results. Electronically Signed   By: Bary RichardStan  Maynard M.D.   On: 11/15/2015 17:54   Result Date: 11/15/2015 CLINICAL DATA:   Several weeks of intermittent right upper quadrant abdominal pain. EXAM: CT ABDOMEN AND PELVIS WITH CONTRAST TECHNIQUE: Multidetector CT imaging of the abdomen and pelvis was performed using the standard protocol following bolus administration of intravenous contrast. CONTRAST:  100mL ISOVUE-300 IOPAMIDOL (ISOVUE-300) INJECTION 61% COMPARISON:  None. FINDINGS: Lower chest: Small patchy consolidations at each lung base, right greater than left, most likely atelectasis. Hepatobiliary: Gallbladder walls are markedly thickened. Associated mild pericholecystic inflammation. Common bile duct distended to approximately 12 mm. No obstructing mass or stones seen at the level of the pancreatic head. Intrahepatic bile duct dilatation. Liver otherwise unremarkable. No focal mass or lesion within the liver. Pancreas: No mass, inflammatory changes, or other significant abnormality. No pancreatic duct dilatation. Spleen: Within normal limits in size and appearance. Adrenals/Urinary Tract: Adrenal glands appear normal. Kidneys appear normal without mass, stone or hydronephrosis. No ureteral or bladder calculi identified. Bladder appears normal. Stomach/Bowel: Bowel is normal in caliber. No bowel wall thickening or evidence of bowel wall inflammation. Appendix is normal. Minimal diverticulosis noted in the sigmoid colon without evidence of acute diverticulitis. Small hiatal hernia.  Stomach appears otherwise normal. Vascular/Lymphatic: No pathologically enlarged lymph nodes. No evidence of abdominal aortic aneurysm. Reproductive: No mass or other significant abnormality. Other: No free fluid or abscess collection seen. No free intraperitoneal air. Musculoskeletal: Mild degenerative change within the scoliotic thoracolumbar spine. No acute or  suspicious osseous finding. Midline diastases rectus with focal periumbilical abdominal wall hernia that contains fat only. Superficial soft tissues otherwise unremarkable. IMPRESSION: 1. Marked  thickening of the walls of the gallbladder with associated pericholecystic inflammation indicating an acute cholecystitis. Common bile duct dilatation to approximately 12 mm is likely commensurate to the adjacent cholecystitis. No obstructing stone or mass seen within the CBD or adjacent pancreatic head, but confirmation by ERCP likely indicated. 2. Additional chronic/incidental findings detailed above. Electronically Signed: By: Bary RichardStan  Maynard M.D. On: 11/15/2015 17:49   Dg Abdomen Acute W/chest  Result Date: 11/15/2015 CLINICAL DATA:  Nausea, gallbladder issues EXAM: DG ABDOMEN ACUTE W/ 1V CHEST COMPARISON:  09/21/2015 FINDINGS: There is normal small bowel gas pattern. Some colonic stool and gas noted in right colon and transverse colon. Mild levoscoliosis lumbar spine. Cardiomediastinal silhouette is unremarkable. Mild elevation of the right hemidiaphragm. No infiltrate or pulmonary edema. No evidence of free abdominal air. IMPRESSION: Normal small bowel gas pattern. No evidence of free abdominal air. Some colonic stool and gas noted in proximal colon. No acute disease within chest. Electronically Signed   By: Natasha MeadLiviu  Pop M.D.   On: 11/15/2015 09:54   [place] Abdomen Limited Ruq  Result Date: 11/15/2015 CLINICAL DATA:  RIGHT upper quadrant pain for 3 days. EXAM: US ABDOMEN LIMITED - RIGHT UPPER QUADRANT COMPARISON:  10/24/2015. FINDINGS: Gallbladder: Multiple gallstones. Several layer in the dependent portion of the gallbladder. Increased wall thickness up to 8 mm. Negative sonographic Murphy's. Approximate largest stone diameter 8 mm. Common bile duct: Diameter: Normal caliber, 4 mm. Liver: No focal lesion identified.  Increased echogenicity. IMPRESSION: Cholelithiasis. Increasing gallbladder wall thickness up to 8 mm compared with previous scan 3 weeks ago. Negative sonographic Murphy's sign. Increased hepatic echogenicity consistent with steatosis. Electronically Signed   By: [name] StainJohn T Curnes M.D.   On:  11/15/2015 09:26    Assessment/Plan: I talk with her in detail. I think she understands the plan. With the rise in her bilirubin and her distended common bile duct despite decompressing the surgery concerned about possibility of ductal obstruction. We will ask gastroenterology to see her with regard to consideration for ERCP. We will keep her nothing by mouth at this point.

## 2015-11-17 NOTE — Consult Note (Addendum)
Midge Miniumarren Treva Huyett, MD [facility]  [address] [place], [place] [address] Phone: [phone] Fax : [phone]  Consultation  Referring Provider:     No ref. provider found Primary Care Physician:  Dorothey BasemanAVID BRONSTEIN, MD Primary Gastroenterologist:  Dr. Bluford Kaufmannh         Reason for Consultation:     Abnormal bilirubin  Date of Admission:  11/15/2015 Date of Consultation:  11/17/2015         HPI:   Michele JumperKaren E James is a 60 y.o. female who underwent a laparoscopic cholecystectomy with an attempted cholangiogram. The patient had an elevated bilirubin that was 2.5 on admission and went up to 4.0 and is now off 4.7. The patient reports that she has some abdominal pain post cholecystectomy. The patient suffers from cerebral palsy and is taken care of by her mother who is in the room at the time of the interview. I'm now being asked to see the patient for possible ERCP with continued elevation of her bilirubin and inability to do a intraoperative cholangiogram.  Past Medical History:  Diagnosis Date  . Arthritis   . Cerebral palsy (HCC)   . Depression   . Dysphagia   . Eczema   . Glaucoma   . Hemorrhoids   . Hypothyroidism   . Seizures (HCC)     Past Surgical History:  Procedure Laterality Date  . COLONOSCOPY WITH PROPOFOL N/A 12/25/2014   Procedure: COLONOSCOPY WITH PROPOFOL;  Surgeon: Wallace CullensPaul Y Oh, MD;  [facility];  Service: Gastroenterology;  Laterality: N/A;  . HERNIA REPAIR      Prior to Admission medications   Medication Sig Start Date End Date Taking? Authorizing Provider  acetaminophen (TYLENOL) 325 MG tablet Take 325 mg by mouth every 6 (six) hours as needed.   Yes Historical Provider, MD  alendronate (FOSAMAX) 70 MG tablet Take 70 mg by mouth once a week. Take with a full glass of water on an empty stomach.   Yes Historical Provider, MD  ALPRAZolam (XANAX) 0.25 MG tablet Take 0.25 mg by mouth 3 (three) times daily as needed for anxiety.   Yes Historical Provider, MD    calcium citrate-vitamin D (CITRACAL+D) 315-200 MG-UNIT per tablet Take 1 tablet by mouth 2 (two) times daily.   Yes Historical Provider, MD  carbamazepine (TEGRETOL) 200 MG tablet Take 200 mg by mouth 3 (three) times daily.   Yes Historical Provider, MD  ergocalciferol (VITAMIN D2) 50000 UNITS capsule Take 50,000 Units by mouth once a week.   Yes Historical Provider, MD  esomeprazole (NEXIUM) 40 MG capsule Take 40 mg by mouth daily at 12 noon.   Yes Historical Provider, MD  fexofenadine (ALLEGRA) 180 MG tablet Take 180 mg by mouth daily.   Yes Historical Provider, MD  hydrocortisone (ANUSOL-HC) 25 MG suppository Place 25 mg rectally 2 (two) times daily as needed for hemorrhoids or itching.   Yes Historical Provider, MD  levothyroxine (SYNTHROID, LEVOTHROID) 125 MCG tablet Take 125 mcg by mouth daily before breakfast.   Yes Historical Provider, MD  naproxen (NAPROSYN) 375 MG tablet Take 375 mg by mouth 2 (two) times daily with a meal.   Yes Historical Provider, MD  OLANZapine (ZYPREXA) 5 MG tablet Take 5 mg by mouth at bedtime.   Yes Historical Provider, MD  PARoxetine (PAXIL) 20 MG tablet Take 20 mg by mouth daily.   Yes Historical Provider, MD  potassium chloride 20 MEQ/15ML (10%) SOLN Take 10 mEq by mouth daily.   Yes Historical  Provider, MD  ranitidine (ZANTAC) 300 MG tablet Take 300 mg by mouth at bedtime.   Yes Historical Provider, MD  SUMAtriptan (IMITREX) 50 MG tablet Take 50 mg by mouth every 2 (two) hours as needed for migraine. May repeat in 2 hours if headache persists or recurs.   Yes Historical Provider, MD  traMADol (ULTRAM) 50 MG tablet Take 50 mg by mouth every 6 (six) hours as needed. 1-2 tablets by mouth every 4-6 hours as needed for pain   Yes Historical Provider, MD    Family History  Problem Relation Age of Onset  . Breast cancer Maternal Aunt   . Breast cancer Maternal Aunt      Social History  Substance Use Topics  . Smoking status: Never Smoker  . Smokeless tobacco:  Never Used  . Alcohol use No    Allergies as of 11/15/2015 - Review Complete 11/15/2015  Allergen Reaction Noted  . Advil [ibuprofen] Itching 12/25/2014  . Potassium-containing compounds Nausea And Vomiting 11/15/2015  . Theophyllines Other (See Comments) 11/15/2015  . Amoxicillin Rash 11/15/2015  . Nsaids Rash 11/15/2015  . Septra [sulfamethoxazole-trimethoprim] Rash 11/15/2015  . Sulfa antibiotics Rash 11/15/2015    Review of Systems:    All systems reviewed and negative except where noted in HPI.   Physical Exam:  Vital signs in last 24 hours: Temp:  [98.3 F (36.8 C)-99 F (37.2 C)] 98.6 F (37 C) (08/19 0502) Pulse Rate:  [89-102] 102 (08/19 0502) Resp:  [15-24] 19 (08/19 0502) BP: (137-157)/(54-80) 137/76 (08/19 0502) SpO2:  [89 %-96 %] 92 % (08/19 0502) Last BM Date: 11/15/15 General:   Pleasant, cooperative in NAD Head:  Normocephalic and atraumatic. Eyes:   No icterus.   Conjunctiva pink. PERRLA. Ears:  Normal auditory acuity. Neck:  Supple; no masses or thyroidomegaly Lungs: Respirations even and unlabored. Lungs clear to auscultation bilaterally.   No wheezes, crackles, or rhonchi.  Heart:  Regular rate and rhythm;  Without murmur, clicks, rubs or gallops Abdomen:  Soft, nondistended, nontender. Normal bowel sounds. No appreciable masses or hepatomegaly.  No rebound or guarding.  Rectal:  Not performed. Msk:  Contracted left arm  Extremities:  Without edema, cyanosis or clubbing. Neurologic:  Alert and oriented x3;  Consistent with patients CP. Skin:  Intact without significant lesions or rashes. Cervical Nodes:  No significant cervical adenopathy. Psych:  Alert and cooperative. Normal affect.  LAB RESULTS:  Recent Labs  11/15/15 1558 11/16/15 0434 11/17/15 0424  WBC 8.8 8.9 12.1*  HGB 10.1* 10.8* 10.2*  HCT 28.7* 31.2* 29.7*  PLT 321 368 351   BMET  Recent Labs  11/15/15 0811 11/15/15 1558 11/16/15 0434 11/16/15 1341 11/17/15 0424  NA 137   --  136  --  136  K 3.1*  --  3.0* 3.6 3.6  CL 101  --  102  --  105  CO2 27  --  28  --  24  GLUCOSE 149*  --  148*  --  134*  BUN 8  --  5*  --  7  CREATININE 0.42* 0.40* 0.33*  --  0.31*  CALCIUM 8.4*  --  8.5*  --  7.9*   LFT  Recent Labs  11/17/15 0424  PROT 6.2*  ALBUMIN 2.5*  AST 113*  ALT 208*  ALKPHOS 168*  BILITOT 4.7*   PT/INR No results for input(s): LABPROT, INR in the last 72 hours.  STUDIES: Ct Abdomen Pelvis W Contrast  Addendum Date: 11/15/2015  ADDENDUM REPORT: 11/15/2015 17:54 ADDENDUM: These results were called by telephone at the time of interpretation on 11/15/2015 at 5:50 pm to Dr. Tiney Rouge III , who verbally acknowledged these results. Electronically Signed   By: Bary Richard M.D.   On: 11/15/2015 17:54   Result Date: 11/15/2015 CLINICAL DATA:  Several weeks of intermittent right upper quadrant abdominal pain. EXAM: CT ABDOMEN AND PELVIS WITH CONTRAST TECHNIQUE: Multidetector CT imaging of the abdomen and pelvis was performed using the standard protocol following bolus administration of intravenous contrast. CONTRAST:  ISOVUE-300 IOPAMIDOL (ISOVUE-300) INJECTION 61% COMPARISON:  None. FINDINGS: Lower chest: Small patchy consolidations at each lung base, right greater than left, most likely atelectasis. Hepatobiliary: Gallbladder walls are markedly thickened. Associated mild pericholecystic inflammation. Common bile duct distended to approximately 12 mm. No obstructing mass or stones seen at the level of the pancreatic head. Intrahepatic bile duct dilatation. Liver otherwise unremarkable. No focal mass or lesion within the liver. Pancreas: No mass, inflammatory changes, or other significant abnormality. No pancreatic duct dilatation. Spleen: Within normal limits in size and appearance. Adrenals/Urinary Tract: Adrenal glands appear normal. Kidneys appear normal without mass, stone or hydronephrosis. No ureteral or bladder calculi identified. Bladder appears  normal. Stomach/Bowel: Bowel is normal in caliber. No bowel wall thickening or evidence of bowel wall inflammation. Appendix is normal. Minimal diverticulosis noted in the sigmoid colon without evidence of acute diverticulitis. Small hiatal hernia.  Stomach appears otherwise normal. Vascular/Lymphatic: No pathologically enlarged lymph nodes. No evidence of abdominal aortic aneurysm. Reproductive: No mass or other significant abnormality. Other: No free fluid or abscess collection seen. No free intraperitoneal air. Musculoskeletal: Mild degenerative change within the scoliotic thoracolumbar spine. No acute or suspicious osseous finding. Midline diastases rectus with focal periumbilical abdominal wall hernia that contains fat only. Superficial soft tissues otherwise unremarkable. IMPRESSION: 1. Marked thickening of the walls of the gallbladder with associated pericholecystic inflammation indicating an acute cholecystitis. Common bile duct dilatation to approximately 12 mm is likely commensurate to the adjacent cholecystitis. No obstructing stone or mass seen within the CBD or adjacent pancreatic head, but confirmation by ERCP likely indicated. 2. Additional chronic/incidental findings detailed above. Electronically Signed: By: Bary Richard M.D. On: 11/15/2015 17:49      Impression / Plan:   Michele James is a 60 y.o. y/o female with Status post cholecystectomy with a 12 mm common bile duct and a difficult intraoperative cholangiogram without the ability to perform the cholangiogram. Due to the patient's bilirubin continuing to go up, the patient will be set up for an ERCP. The patient has been explained the risks including bleeding pancreatitis infection and death. The patient will be set up for the ERCP for tomorrow.   Thank you for involving me in the care of this patient.      LOS: 2 days   Midge Minium, MD  11/17/2015, 12:12 PM   Note: This dictation was prepared with Dragon dictation along with  smaller phrase technology. Any transcriptional errors that result from this process are unintentional.

## 2015-11-18 ENCOUNTER — Inpatient Hospital Stay: Payer: Medicare Other | Admitting: Anesthesiology

## 2015-11-18 ENCOUNTER — Encounter: Payer: Self-pay | Admitting: Anesthesiology

## 2015-11-18 ENCOUNTER — Encounter
Admission: EM | Disposition: A | Discharge status: Home / Self Care | Payer: Self-pay | Source: Home / Self Care | Attending: Surgery

## 2015-11-18 DIAGNOSIS — K8050 Calculus of bile duct without cholangitis or cholecystitis without obstruction: Secondary | ICD-10-CM

## 2015-11-18 HISTORY — PX: ENDOSCOPIC RETROGRADE CHOLANGIOPANCREATOGRAPHY (ERCP) WITH PROPOFOL: SHX5810

## 2015-11-18 LAB — COMPREHENSIVE METABOLIC PANEL WITH GFR
ALT: 162 U/L — ABNORMAL HIGH (ref 14–54)
AST: 97 U/L — ABNORMAL HIGH (ref 15–41)
Albumin: 2.4 g/dL — ABNORMAL LOW (ref 3.5–5.0)
Alkaline Phosphatase: 224 U/L — ABNORMAL HIGH (ref 38–126)
Anion gap: 6 (ref 5–15)
BUN: 5 mg/dL — ABNORMAL LOW (ref 6–20)
CO2: 25 mmol/L (ref 22–32)
Calcium: 8.5 mg/dL — ABNORMAL LOW (ref 8.9–10.3)
Chloride: 104 mmol/L (ref 101–111)
Creatinine, Ser: <0.3 mg/dL — ABNORMAL LOW (ref 0.44–1.00)
Glucose, Bld: 143 mg/dL — ABNORMAL HIGH (ref 65–99)
Potassium: 3.8 mmol/L (ref 3.5–5.1)
Sodium: 135 mmol/L (ref 135–145)
Total Bilirubin: 4.9 mg/dL — ABNORMAL HIGH (ref 0.3–1.2)
Total Protein: 6.3 g/dL — ABNORMAL LOW (ref 6.5–8.1)

## 2015-11-18 LAB — CBC
HCT: 30.5 % — ABNORMAL LOW (ref 35.0–47.0)
Hemoglobin: 10.5 g/dL — ABNORMAL LOW (ref 12.0–16.0)
MCH: 31.1 pg (ref 26.0–34.0)
MCHC: 34.4 g/dL (ref 32.0–36.0)
MCV: 90.6 fL (ref 80.0–100.0)
Platelets: 395 10*3/uL (ref 150–440)
RBC: 3.37 MIL/uL — ABNORMAL LOW (ref 3.80–5.20)
RDW: 13.8 % (ref 11.5–14.5)
WBC: 8.2 10*3/uL (ref 3.6–11.0)

## 2015-11-18 SURGERY — ENDOSCOPIC RETROGRADE CHOLANGIOPANCREATOGRAPHY (ERCP) WITH PROPOFOL
Anesthesia: General

## 2015-11-18 MED ORDER — FENTANYL CITRATE (PF) 100 MCG/2ML IJ SOLN
INTRAMUSCULAR | Status: DC | PRN
  Administered 2015-11-18 (×2): 50 ug via INTRAVENOUS

## 2015-11-18 MED ORDER — ONDANSETRON HCL 4 MG/2ML IJ SOLN: 4.0000 mg | Freq: Once | INTRAMUSCULAR | Status: DC | PRN

## 2015-11-18 MED ORDER — ONDANSETRON HCL 4 MG/2ML IJ SOLN
INTRAMUSCULAR | Status: DC | PRN
  Administered 2015-11-18: 4 mg via INTRAVENOUS

## 2015-11-18 MED ORDER — DEXAMETHASONE SODIUM PHOSPHATE 10 MG/ML IJ SOLN
INTRAMUSCULAR | Status: DC | PRN
  Administered 2015-11-18: 10 mg via INTRAVENOUS

## 2015-11-18 MED ORDER — SODIUM CHLORIDE 0.9 % IV SOLN
INTRAVENOUS | Status: DC | PRN
Start: 2015-11-18 — End: 2015-11-18
  Administered 2015-11-18: 08:00:00 via INTRAVENOUS

## 2015-11-18 MED ORDER — LIDOCAINE HCL (CARDIAC) 20 MG/ML IV SOLN
INTRAVENOUS | Status: DC | PRN
  Administered 2015-11-18: 30 mg via INTRAVENOUS

## 2015-11-18 MED ORDER — INDOMETHACIN 50 MG RE SUPP
100.0000 mg | Freq: Once | RECTAL | Status: AC
  Administered 2015-11-18: 100 mg via RECTAL
  Filled 2015-11-18: qty 2

## 2015-11-18 MED ORDER — SUGAMMADEX SODIUM 200 MG/2ML IV SOLN
INTRAVENOUS | Status: DC | PRN
  Administered 2015-11-18: 160 mg via INTRAVENOUS

## 2015-11-18 MED ORDER — PHENYLEPHRINE HCL 10 MG/ML IJ SOLN
INTRAMUSCULAR | Status: DC | PRN
  Administered 2015-11-18 (×5): 100 ug via INTRAVENOUS

## 2015-11-18 MED ORDER — PROPOFOL 10 MG/ML IV BOLUS
INTRAVENOUS | Status: DC | PRN
  Administered 2015-11-18: 80 mg via INTRAVENOUS

## 2015-11-18 MED ORDER — FENTANYL CITRATE (PF) 100 MCG/2ML IJ SOLN: 25.0000 ug | INTRAMUSCULAR | Status: DC | PRN

## 2015-11-18 MED ORDER — ROCURONIUM BROMIDE 100 MG/10ML IV SOLN
INTRAVENOUS | Status: DC | PRN
  Administered 2015-11-18: 20 mg via INTRAVENOUS

## 2015-11-18 NOTE — Op Note (Addendum)
[facility] Gastroenterology Patient Name: Michele BlackerKaren James Procedure Date: 11/18/2015 8:02 AM MRN: [id number] Account #: [id number] Date of Birth: 09-Apr-1955 Admit Type: Inpatient Age: [age] [facility] 4 Gender: Female Note Status: Supervisor Override Procedure:            ERCP Indications:          Suspected bile duct stone(s) Providers:            Midge Miniumarren Zahi Plaskett MD, MD Referring MD:         Teena Iraniavid M. Terance HartBronstein, MD (Referring MD) Medicines:            General Anesthesia Complications:        No immediate complications. Procedure:            Pre-Anesthesia Assessment:                       - Prior to the procedure, a History and Physical was                        performed, and patient medications and allergies were                        reviewed. The patient's tolerance of previous                        anesthesia was also reviewed. The risks and benefits of                        the procedure and the sedation options and risks were                        discussed with the patient. All questions were                        answered, and informed consent was obtained. Prior                        Anticoagulants: The patient has taken no previous                        anticoagulant or antiplatelet agents. ASA Grade                        Assessment: II - A patient with mild systemic disease.                        After reviewing the risks and benefits, the patient was                        deemed in satisfactory condition to undergo the                        procedure.                       After obtaining informed consent, the scope was passed                        under direct vision. Throughout the procedure, the  patient's blood pressure, pulse, and oxygen saturations                        were monitored continuously. The Endosonoscope was                        introduced through the mouth, and used to inject        contrast into and used to inject contrast into the bile                        duct and ventral pancreatic duct. The ERCP was                        accomplished without difficulty. The patient tolerated                        the procedure well. Findings:      A scout film of the abdomen was obtained. One percutaneous drain ending       in the Subhepatic space was seen. A scout film of the abdomen was       obtained. Surgical clips, consistent with a previous cholecystectomy,       were seen in the area of the right upper quadrant of the abdomen. The       major papilla was bulging. The bile duct was deeply cannulated. Contrast       was injected. I personally interpreted the bile duct and pancreatic duct       images. There was brisk flow of contrast through the ducts. Image       quality was excellent. Contrast extended to the entire biliary tree. The       main bile duct contained multiple stones mm. A wire was passed into the       biliary tree. A 10 mm biliary sphincterotomy was made with a traction       (standard) sphincterotome using ERBE electrocautery. There was no       post-sphincterotomy bleeding. The biliary tree was swept with a 15 mm       balloon starting at the bifurcation. Sixteen stones were removed. No       stones remained. Impression:           - The major papilla appeared to be bulging.                       - Choledocholithiasis was found. Complete removal was                        accomplished by biliary sphincterotomy and balloon                        extraction.                       - A biliary sphincterotomy was performed.                       - The biliary tree was swept. Recommendation:       - Watch for pancreatitis, bleeding, perforation, and                        cholangitis.                       -  Clear liquid diet. Procedure Code(s):    --- Professional ---                       [phone], Endoscopic retrograde cholangiopancreatography                         (ERCP); with removal of calculi/debris from                        biliary/pancreatic duct(s)                       43262, Endoscopic retrograde cholangiopancreatography                        (ERCP); with sphincterotomy/papillotomy                       [phone], Combined endoscopic catheterization of the                        biliary and pancreatic ductal systems, radiological                        supervision and interpretation Diagnosis Code(s):    --- Professional ---                       K80.50, Calculus of bile duct without cholangitis or                        cholecystitis without obstruction CPT copyright 2016 American Medical Association. All rights reserved. The codes documented in this report are preliminary and upon coder review may  be revised to meet current compliance requirements. Midge Minium MD, MD 11/18/2015 8:56:16 AM This report has been signed electronically. Number of Addenda: 0 Note Initiated On: 11/18/2015 8:02 AM      [facility]

## 2015-11-18 NOTE — Progress Notes (Signed)
Subjective:   She complains of more nausea and abdominal discomfort after last night's meal. She's not having any significant vomiting. She did not have any fever. Her bilirubin is slightly up this morning at 4.9. Otherwise her labs look good. She has no other complaints.  Vital signs in last 24 hours: Temp:  [98.1 F (36.7 C)-99 F (37.2 C)] 98.1 F (36.7 C) (08/20 0544) Pulse Rate:  [79-97] 97 (08/20 0544) Resp:  [18-22] 22 (08/20 0544) BP: (151-155)/(56-67) 151/56 (08/20 0544) SpO2:  [95 %-99 %] 95 % (08/20 0544) Last BM Date: 11/15/15  Intake/Output from previous day: 08/19 0701 - 08/20 0700 In: 4293 [P.O.:760; I.V.:3480; IV Piggyback:53] Out: 410 [Urine:370; Drains:40]  Exam:  Her abdomen is soft with good bowel sounds. She has only minor drainage from her JP drain all of which is serosanguineous. She has no other wound problems.  Lab Results:  CBC  Recent Labs  11/17/15 0424 11/18/15 0443  WBC 12.1* 8.2  HGB 10.2* 10.5*  HCT 29.7* 30.5*  PLT 351 395   CMP     Component Value Date/Time   NA 135 11/18/2015 0443   K 3.8 11/18/2015 0443   CL 104 11/18/2015 0443   CO2 25 11/18/2015 0443   GLUCOSE 143 (H) 11/18/2015 0443   BUN 5 (L) 11/18/2015 0443   CREATININE <0.30 (L) 11/18/2015 0443   CALCIUM 8.5 (L) 11/18/2015 0443   PROT 6.3 (L) 11/18/2015 0443   ALBUMIN 2.4 (L) 11/18/2015 0443   AST 97 (H) 11/18/2015 0443   ALT 162 (H) 11/18/2015 0443   ALKPHOS 224 (H) 11/18/2015 0443   BILITOT 4.9 (H) 11/18/2015 0443   GFRNONAA NOT CALCULATED 11/18/2015 0443   GFRAA NOT CALCULATED 11/18/2015 0443   PT/INR No results for input(s): LABPROT, INR in the last 72 hours.  Studies/Results: No results found.  Assessment/Plan: We are concerned about possibility of retained common bile duct stone with some biliary obstruction. She is set up for an ERCP this morning.

## 2015-11-18 NOTE — Anesthesia Procedure Notes (Signed)
Procedure Name: Intubation Date/Time: 11/18/2015 8:15 AM Performed by: Omer JackWEATHERLY, Darika Ildefonso Pre-anesthesia Checklist: Patient identified, Patient being monitored, Timeout performed, Emergency Drugs available and Suction available Patient Re-evaluated:Patient Re-evaluated prior to inductionOxygen Delivery Method: Circle system utilized Preoxygenation: Pre-oxygenation with 100% oxygen Intubation Type: IV induction Ventilation: Mask ventilation without difficulty Laryngoscope Size: Mac and 3 Grade View: Grade I Tube type: Oral Tube size: 7.0 mm Number of attempts: 1 Placement Confirmation: ETT inserted through vocal cords under direct vision,  positive ETCO2 and breath sounds checked- equal and bilateral Secured at: 21 cm Tube secured with: Tape Dental Injury: Teeth and Oropharynx as per pre-operative assessment

## 2015-11-18 NOTE — Transfer of Care (Signed)
Immediate Anesthesia Transfer of Care Note  Patient: Michele James  Procedure(s) Performed: Procedure(s): ENDOSCOPIC RETROGRADE CHOLANGIOPANCREATOGRAPHY (ERCP) WITH PROPOFOL (N/A)  Patient Location: PACU  Anesthesia Type:General  Level of Consciousness: awake, alert  and oriented  Airway & Oxygen Therapy: Patient Spontanous Breathing and Patient connected to face mask oxygen  Post-op Assessment: Report given to RN and Post -op Vital signs reviewed and stable  Post vital signs: Reviewed and stable  Last Vitals:  Vitals:   11/18/15 0747 11/18/15 0911  BP: (!) 146/59 (!) 138/58  Pulse: 92   Resp: (!) 22 (!) 26  Temp: 36.5 C     Last Pain:  Vitals:   11/18/15 0747  TempSrc: Tympanic  PainSc:       Patients Stated Pain Goal: 0 (11/17/15 0516)  Complications: No apparent anesthesia complications

## 2015-11-18 NOTE — Anesthesia Postprocedure Evaluation (Signed)
Anesthesia Post Note  Patient: Michele James  Procedure(James) Performed: Procedure(James) (LRB): ENDOSCOPIC RETROGRADE CHOLANGIOPANCREATOGRAPHY (ERCP) WITH PROPOFOL (N/A)  Patient location during evaluation: PACU Anesthesia Type: General Level of consciousness: awake and alert Pain management: pain level controlled Vital Signs Assessment: post-procedure vital signs reviewed and stable Respiratory status: spontaneous breathing, nonlabored ventilation, respiratory function stable and patient connected to nasal cannula oxygen Cardiovascular status: blood pressure returned to baseline and stable Postop Assessment: no signs of nausea or vomiting Anesthetic complications: no    Last Vitals:  Vitals:   11/18/15 0911 11/18/15 1010  BP: (!) 138/58 (!) 136/54  Pulse:  91  Resp: (!) 26 20  Temp:  36.6 C    Last Pain:  Vitals:   11/18/15 1010  TempSrc: Oral  PainSc:                  Michele James

## 2015-11-18 NOTE — Anesthesia Preprocedure Evaluation (Signed)
Anesthesia Evaluation  Patient identified by MRN, date of birth, ID band Patient awake    Reviewed: Allergy & Precautions, NPO status , Patient's Chart, lab work & pertinent test results, reviewed documented beta blocker date and time   Airway Mallampati: II  TM Distance: >3 FB     Dental  (+) Chipped   Pulmonary           Cardiovascular      Neuro/Psych Seizures -,  PSYCHIATRIC DISORDERS Depression    GI/Hepatic   Endo/Other  Hypothyroidism   Renal/GU      Musculoskeletal  (+) Arthritis ,   Abdominal   Peds  Hematology   Anesthesia Other Findings   Reproductive/Obstetrics                             Anesthesia Physical Anesthesia Plan  ASA: III  Anesthesia Plan: General   Post-op Pain Management:    Induction: Intravenous  Airway Management Planned:   Additional Equipment:   Intra-op Plan:   Post-operative Plan:   Informed Consent: I have reviewed the patients History and Physical, chart, labs and discussed the procedure including the risks, benefits and alternatives for the proposed anesthesia with the patient or authorized representative who has indicated his/her understanding and acceptance.     Plan Discussed with: CRNA  Anesthesia Plan Comments:         Anesthesia Quick Evaluation

## 2015-11-18 NOTE — Progress Notes (Signed)
Report given to Endoscopy nurse, and nurse requested to send indomethacin medication sent down with patient as requested. Patient is alert and oriented, no acute distress noted. Transport at the bedside to take patient down to endoscopy.

## 2015-11-18 NOTE — Brief Op Note (Signed)
Pt. Intubated successfully for ERCP by J.Zachery DakinsWeatherly, CRNA

## 2015-11-19 ENCOUNTER — Encounter: Payer: Self-pay | Admitting: Surgery

## 2015-11-19 DIAGNOSIS — K8050 Calculus of bile duct without cholangitis or cholecystitis without obstruction: Secondary | ICD-10-CM

## 2015-11-19 LAB — COMPREHENSIVE METABOLIC PANEL
ALT: 106 U/L — ABNORMAL HIGH (ref 14–54)
ANION GAP: 5 (ref 5–15)
AST: 43 U/L — ABNORMAL HIGH (ref 15–41)
Albumin: 2.2 g/dL — ABNORMAL LOW (ref 3.5–5.0)
Alkaline Phosphatase: 176 U/L — ABNORMAL HIGH (ref 38–126)
BILIRUBIN TOTAL: 1.5 mg/dL — AB (ref 0.3–1.2)
BUN: 7 mg/dL (ref 6–20)
CO2: 23 mmol/L (ref 22–32)
Calcium: 7.9 mg/dL — ABNORMAL LOW (ref 8.9–10.3)
Chloride: 110 mmol/L (ref 101–111)
Creatinine, Ser: 0.35 mg/dL — ABNORMAL LOW (ref 0.44–1.00)
Glucose, Bld: 115 mg/dL — ABNORMAL HIGH (ref 65–99)
POTASSIUM: 3.8 mmol/L (ref 3.5–5.1)
Sodium: 138 mmol/L (ref 135–145)
TOTAL PROTEIN: 6 g/dL — AB (ref 6.5–8.1)

## 2015-11-19 LAB — CBC
HEMATOCRIT: 27.5 % — AB (ref 35.0–47.0)
Hemoglobin: 9.6 g/dL — ABNORMAL LOW (ref 12.0–16.0)
MCH: 32.3 pg (ref 26.0–34.0)
MCHC: 35 g/dL (ref 32.0–36.0)
MCV: 92.2 fL (ref 80.0–100.0)
Platelets: 402 10*3/uL (ref 150–440)
RBC: 2.98 MIL/uL — ABNORMAL LOW (ref 3.80–5.20)
RDW: 13.8 % (ref 11.5–14.5)
WBC: 7 10*3/uL (ref 3.6–11.0)

## 2015-11-19 LAB — LIPASE, BLOOD: LIPASE: 20 U/L (ref 11–51)

## 2015-11-19 NOTE — Care Management (Addendum)
Patient  presents from home.  ERCP 8/20.  J P drain in place.  Hx of cerebral palsy.  Discussed the need to mobilize patient during progression.  Spoke with patient's mother and father.  Patient baseline physical care can be demanding and patient's father is now having back issues.  Mother says patient needs to go to a facility due to the need to have the drain emptied and for her baths.  Discussed this is custodial care and emptying jp drain is not skilled care.    Discussed with mother that may need to start making some long range plans for patient.  She says they have been discussing this and thought "we would all go to an assisted living."  Patient at baseline needs maximum assist with lower body dressing and bath.  She is having increasing urinary incontinence and wears depends. She transfers with crutches to wheelchair.  Patient and her mother agreeable to home with home health SN, Aide, maybe PT, OT ([facility]ls) and social work for long range planning.  Patient has medicaid and would think she would qualify for PCS.  Family May need support and assist with planning for facility placement.  Provided list of agencies and chose [facility].  Heads up referral made.

## 2015-11-19 NOTE — Progress Notes (Signed)
  Michele Miniumarren Deonta Bomberger, MD [facility]   [address] [place], [place] [address] Phone: [phone] Fax : [phone]   Subjective: This patient is status post ERCP with multiple stones removed from the common bile duct.  The patient has no complaints today except some diarrhea this morning.  The patient's liver enzymes have gone down since the ERCP.  He denies any abdominal pain nausea or vomiting.   Objective: Vital signs in last 24 hours: Vitals:   11/18/15 1311 11/18/15 2002 11/19/15 0457 11/19/15 1321  BP: 133/70 132/62 (!) 135/58 (!) 140/57  Pulse: 99 96 97 96  Resp: 20 20 20 20   Temp: 98 F (36.7 C) 98.5 F (36.9 C) 98.3 F (36.8 C) 98.8 F (37.1 C)  TempSrc: Oral Oral Oral Oral  SpO2: 90% 95% 93% 95%  Weight:      Height:       Weight change:   Intake/Output Summary (Last 24 hours) at 11/19/15 1931 Last data filed at 11/19/15 1532  Gross per 24 hour  Intake          2803.33 ml  Output             2065 ml  Net           738.33 ml     Exam: Heart:: Regular rate and rhythm Lungs: normal Abdomen: soft, nontender, normal bowel sounds   Lab Results: @LABTEST2 @ Micro Results: Recent Results (from the past 240 hour(s))  Surgical PCR screen     Status: None   Collection Time: 11/15/15  5:17 PM  Result Value Ref Range Status   MRSA, PCR NEGATIVE NEGATIVE Final   Staphylococcus aureus NEGATIVE NEGATIVE Final    Comment:        The Xpert SA Assay (FDA approved for NASAL specimens in patients over 58 years of age), is one component of a comprehensive surveillance program.  Test performance has been validated by [facility] for patients greater than or equal to 58 year old. It is not intended to diagnose infection nor to guide or monitor treatment.    Studies/Results: No results found. Medications: I have reviewed the patient's current medications. Scheduled Meds: . calcium citrate-vitamin D  1 tablet Oral BID  . carbamazepine  200 mg Oral TID  .  enoxaparin (LOVENOX) injection  40 mg Subcutaneous Q24H  . levothyroxine  125 mcg Oral QAC breakfast  . loratadine  10 mg Oral Daily  . OLANZapine  5 mg Oral QHS  . pantoprazole  40 mg Oral BID  . PARoxetine  20 mg Oral Daily  . piperacillin-tazobactam (ZOSYN)  IV  3.375 g Intravenous Q8H   Continuous Infusions:  PRN Meds:.acetaminophen, ALPRAZolam, HYDROcodone-acetaminophen, morphine injection, ondansetron **OR** ondansetron (ZOFRAN) IV   Assessment: Active Problems:   Cholecystitis with cholelithiasis   Elevated bilirubin   Dilated cbd, acquired   Calculus of common duct    Plan: Nothing further to offer from a GI point of view.  The patient has had an ERCP with her common bile duct stones removed.  I will sign off.  Please call if any further GI concerns or questions.  We would like to thank you for the opportunity to participate in the care of Michele James.   LOS: 4 days   Michele MiniumDarren Michele James 11/19/2015, 7:31 PM

## 2015-11-19 NOTE — Care Management Important Message (Signed)
Important Message  Patient Details  Name: Romie JumperKaren E Rho MRN: [id number] Date of Birth: Jun 12, 1955   Medicare Important Message Given:  Yes    Eber HongGreene, Matisse Roskelley R, RN 11/19/2015, 10:12 AM

## 2015-11-19 NOTE — Progress Notes (Signed)
POD # 3 s/p lap chole POD # 1 s/p ERCP on pt w cerebral palsy Tolerating diet Still having some int pain AVSS  PE NAD  Abd: soft, nt., incisions c/d/i Drain small amount of bile  A/P Doing well Advance diet heplock ivf Contained biloma that should improve w JP  No evidence of pancreatitis DC in am w home health Case manager to talk to fam about dispo

## 2015-11-20 LAB — SURGICAL PATHOLOGY

## 2015-11-20 NOTE — Anesthesia Postprocedure Evaluation (Signed)
Anesthesia Post Note  Patient: Romie JumperKaren E Boshers  Procedure(s) Performed: Procedure(s) (LRB): LAPAROSCOPIC CHOLECYSTECTOMY (N/A)  Patient location during evaluation: PACU Anesthesia Type: General Level of consciousness: awake and alert and oriented Pain management: pain level controlled Vital Signs Assessment: post-procedure vital signs reviewed and stable Respiratory status: spontaneous breathing Cardiovascular status: blood pressure returned to baseline Anesthetic complications: no    Last Vitals:  Vitals:   11/19/15 2001 11/20/15 0454  BP: (!) 147/60 (!) 147/69  Pulse: 98 98  Resp: 20 20  Temp: 36.9 C 36.9 C    Last Pain:  Vitals:   11/20/15 0454  TempSrc: Oral  PainSc:                  Tiffney Haughton

## 2015-11-20 NOTE — Progress Notes (Signed)
Alert and oriented. Vital signs stable . No signs of acute distress. Discharge instructions given. Patient verbalizes understanding. Education  Given to patient and family No other issues noted at this time.

## 2015-11-20 NOTE — Care Management (Signed)
Patient to be discharged home today.  Home health orders have been placed for RN, PT, aide and SW.  Barbara CowerJason with [facility] has been notified of discharge.  RNCM signing off

## 2015-11-20 NOTE — Evaluation (Signed)
Physical Therapy Evaluation Patient Details Name: Michele James MRN: [id number] DOB: [birth year] Today's Date: 11/20/2015   History of Present Illness  Pt is a 60 y.o. female who underwent a laparoscopic cholecystectomy and ERCP with common bile duct stones removed, JP drain in place.  Pt has a Hx of cerebral palsy and baseline receives assistance at home from parents.   Clinical Impression  Pt presents with deficits in strength, gait, transfers, and mobility.  Baseline difficult to assess from pt and pt's mother but in general appears that pt requires additional assistance at this time relative to baseline.  Pt max A with bed mobility, min A with transfers, and ambulated 15' with B axillary crutches and CGA.  Pt will benefit from PT services to address these deficits for decreased caregiver assistance upon discharge.     Follow Up Recommendations Home health PT    Equipment Recommendations       Recommendations for Other Services       Precautions / Restrictions Precautions Precautions: Fall Precaution Comments: Abdominal incision and JP drain in place Required Braces or Orthoses: Other Brace/Splint Other Brace/Splint: L knee brace during gait/transfers Restrictions Weight Bearing Restrictions: No      Mobility  Bed Mobility Overal bed mobility: Needs Assistance Bed Mobility: Rolling;Supine to Sit;Sit to Supine Rolling: Min assist   Supine to sit: Max assist Sit to supine: Max assist   General bed mobility comments: Log roll technique secondary to abdominal incision  Transfers Overall transfer level: Needs assistance Equipment used: Crutches Transfers: Sit to/from Stand Sit to Stand: Min assist            Ambulation/Gait Ambulation/Gait assistance: Min guard Ambulation Distance (Feet): 15 Feet Assistive device: Crutches     Gait velocity interpretation: <1.8 ft/sec, indicative of risk for recurrent falls    Stairs            Wheelchair Mobility     Modified Rankin (Stroke Patients Only)       Balance                                             Pertinent Vitals/Pain Pain Assessment: No/denies pain    Home Living Family/patient expects to be discharged to:: Private residence Living Arrangements: Parent Available Help at Discharge: Family Type of Home: House Home Access: Ramped entrance     Home Layout: One level Home Equipment: Wheelchair - manual;Crutches      Prior Function Level of Independence: Needs assistance   Gait / Transfers Assistance Needed: Mod I with amb limited distances with ax crutches           Hand Dominance   Dominant Hand: Right    Extremity/Trunk Assessment   Upper Extremity Assessment: Defer to OT evaluation           Lower Extremity Assessment: Generalized weakness         Communication      Cognition Arousal/Alertness: Awake/alert Behavior During Therapy: WFL for tasks assessed/performed                        General Comments      Exercises Other Exercises Other Exercises: Bed mobility training with log roll technique; transfer training with min verbal cues for sequencing      Assessment/Plan    PT Assessment Patient needs continued PT services  PT Diagnosis Difficulty walking;Generalized weakness   PT Problem List Decreased strength;Decreased activity tolerance;Decreased mobility  PT Treatment Interventions DME instruction;Gait training;Functional mobility training;Therapeutic activities;Therapeutic exercise;Balance training;Neuromuscular re-education;Patient/family education   PT Goals (Current goals can be found in the Care Plan section) Acute Rehab PT Goals Patient Stated Goal: To walk better PT Goal Formulation: With patient Time For Goal Achievement: 12/03/15 Potential to Achieve Goals: Fair    Frequency Min 2X/week   Barriers to discharge        Co-evaluation               End of Session Equipment Utilized  During Treatment: Gait belt;Other (comment) (L knee brace) Activity Tolerance: Patient limited by fatigue Patient left: in chair;with call bell/phone within reach;with family/visitor present;Other (comment) (Nurse and CNA notified of need for chair alarm pad and box) Nurse Communication: Mobility status;Precautions         Time: 1030-1110 PT Time Calculation (min) (ACUTE ONLY): 40 min   Charges:   PT Evaluation $PT Eval Low Complexity: 1 Procedure PT Treatments $Therapeutic Activity: 8-22 mins   PT G Codes:        [name] PT, DPT 11/20/15, 11:37 AM

## 2015-11-21 NOTE — Discharge Summary (Signed)
Patient ID: Michele James MRN: [id number] DOB/AGE: Sep 20, 1955 60 y.o.  Admit date: 11/15/2015 Discharge date: 11/21/2015   Discharge Diagnoses:  Active Problems:   Cholecystitis with cholelithiasis   Elevated bilirubin   Dilated cbd, acquired   Calculus of common duct   Procedures: Laparoscopic cholecystectomy with intraoperative cholangiogram by Dr. Kittie PlaterEly  Hospital Course: This is a 60 year old female admitted for acute cholecystitis and taken promptly to the operating room for a laparoscopic vasectomy with intraoperative colonogram. The intraoperative colonogram showed evidence of common bile duct stones and and an ERCP was scheduled. Michele James perform an ERCP and a sphincterotomy without any issues. And patient did have evidence of a small bile leak and evident on some bile from the JP. This continued to improve as she stated the hospital. As she had a significant comorbidities including cerebral palsy requiring some physical therapy. She was placed preoperatively and antibiotics and did very well and did not develop any complications from her laparoscopic cholecystectomy or for her ERCP. At the time of discharge she was tolerating regular diet she was afebrile and her vital signs were stable. Her physical exam revealed an female is were healing well there was scant amount of bile from the JP and. No evidence of peritonitis and no evidence of wound infection. Her extremities will were perfused and warm. She will be sent home with a JP and home health and. We will see her back in the office next week hopefully her leak had already stopped since she ray has an ERCP and sphincterotomy. At that time we'll also remove the stitches. Condition of the patient and the time of discharge is stable  Consults: Michele James GI  Disposition: 01-Home or Self Care  Discharge Instructions    Call Michele James for:  difficulty breathing, headache or visual disturbances    Complete by:  As directed   Call Michele James for:  extreme  fatigue    Complete by:  As directed   Call Michele James for:  hives    Complete by:  As directed   Call Michele James for:  persistant dizziness or light-headedness    Complete by:  As directed   Call Michele James for:  persistant nausea and vomiting    Complete by:  As directed   Call Michele James for:  redness, tenderness, or signs of infection (pain, swelling, redness, odor or green/yellow discharge around incision site)    Complete by:  As directed   Call Michele James for:  severe uncontrolled pain    Complete by:  As directed   Call Michele James for:  temperature >100.4    Complete by:  As directed   Diet - low sodium heart healthy    Complete by:  As directed   Discharge instructions    Complete by:  As directed   Empty JP drain BID   Increase activity slowly    Complete by:  As directed   Lifting restrictions    Complete by:  As directed   20 lbs 6 weeks       Medication List    TAKE these medications   acetaminophen 325 MG tablet Commonly known as:  TYLENOL Take 325 mg by mouth every 6 (six) hours as needed.   alendronate 70 MG tablet Commonly known as:  FOSAMAX Take 70 mg by mouth once a week. Take with a full glass of water on an empty stomach.   ALPRAZolam 0.25 MG tablet Commonly known as:  XANAX Take 0.25 mg by mouth 3 (three) times  daily as needed for anxiety.   calcium citrate-vitamin D 315-200 MG-UNIT tablet Commonly known as:  CITRACAL+D Take 1 tablet by mouth 2 (two) times daily.   carbamazepine 200 MG tablet Commonly known as:  TEGRETOL Take 200 mg by mouth 3 (three) times daily.   ergocalciferol 50000 units capsule Commonly known as:  VITAMIN D2 Take 50,000 Units by mouth once a week.   esomeprazole 40 MG capsule Commonly known as:  NEXIUM Take 40 mg by mouth daily at 12 noon.   fexofenadine 180 MG tablet Commonly known as:  ALLEGRA Take 180 mg by mouth daily.   hydrocortisone 25 MG suppository Commonly known as:  ANUSOL-HC Place 25 mg rectally 2 (two) times daily as needed for hemorrhoids or itching.    levothyroxine 125 MCG tablet Commonly known as:  SYNTHROID, LEVOTHROID Take 125 mcg by mouth daily before breakfast.   naproxen 375 MG tablet Commonly known as:  NAPROSYN Take 375 mg by mouth 2 (two) times daily with a meal.   OLANZapine 5 MG tablet Commonly known as:  ZYPREXA Take 5 mg by mouth at bedtime.   PARoxetine 20 MG tablet Commonly known as:  PAXIL Take 20 mg by mouth daily.   potassium chloride 20 MEQ/15ML (10%) Soln Take 10 mEq by mouth daily.   ranitidine 300 MG tablet Commonly known as:  ZANTAC Take 300 mg by mouth at bedtime.   SUMAtriptan 50 MG tablet Commonly known as:  IMITREX Take 50 mg by mouth every 2 (two) hours as needed for migraine. May repeat in 2 hours if headache persists or recurs.   traMADol 50 MG tablet Commonly known as:  ULTRAM Take 50 mg by mouth every 6 (six) hours as needed. 1-2 tablets by mouth every 4-6 hours as needed for pain      Follow-up Information    Michele James Michele James, Michele James. Go on 12/29/2015.   Specialty:  General Surgery Why:  @9 :15am Contact information: [address] [place] [address] [phone]            Michele James Michele James, Michele James

## 2015-11-28 ENCOUNTER — Ambulatory Visit (INDEPENDENT_AMBULATORY_CARE_PROVIDER_SITE_OTHER): Payer: Medicare Other | Admitting: Surgery

## 2015-11-28 ENCOUNTER — Encounter: Payer: Self-pay | Admitting: Surgery

## 2015-11-28 ENCOUNTER — Ambulatory Visit: Payer: Self-pay | Admitting: General Surgery

## 2015-11-28 VITALS — BP 129/74 | HR 90 | Temp 99.0°F

## 2015-11-28 DIAGNOSIS — Z09 Encounter for follow-up examination after completed treatment for conditions other than malignant neoplasm: Secondary | ICD-10-CM

## 2015-11-28 NOTE — Patient Instructions (Signed)
Please give us a call if you have any questions or concerns.   We will see you in a month to talk about the hernia and possibly do surgery.  Please call our office with any questions or concerns.  Please do not submerge in a tub, hot tub, or pool until incisions are completely sealed.  Use sun block to incision area over the next year if this area will be exposed to sun. This helps decrease scarring.  You may now resume your normal activities. Listen to your body when lifting, if you have pain when lifting, stop and then try again in a few days.  If you develop redness, drainage, or pain at incision sites- call our office immediately and speak with a nurse.

## 2015-11-28 NOTE — Progress Notes (Signed)
S/p lap chole w drain placement by Dr. Michela PitcherEly, required ERCP for CBD stones Had minimal bile leak that was managed w the drain. He is doing very well and is taking by mouth. No abdominal pain. Less than 5 cc a day of serous fluid from the JP. No other surgical issues. She does have a history of some cerebral palsy and is in a wheelchair  PE NAD Abd: incisions c/d/i, no infection JP removed as well as stitches. ? diastasis recti   A/P doing well RTC 4 weeks to reassess diastasis recti vs ventral hernia. She does have A small HH on CT No surgical issues PT needs to see PCP within a few weeks for all her medical probles

## 2016-01-02 ENCOUNTER — Encounter (INDEPENDENT_AMBULATORY_CARE_PROVIDER_SITE_OTHER): Payer: Self-pay

## 2016-01-02 ENCOUNTER — Ambulatory Visit (INDEPENDENT_AMBULATORY_CARE_PROVIDER_SITE_OTHER): Payer: Medicare Other | Admitting: Surgery

## 2016-01-02 ENCOUNTER — Encounter: Payer: Self-pay | Admitting: Surgery

## 2016-01-02 VITALS — BP 128/74 | HR 88 | Temp 99.1°F | Ht 63.0 in | Wt 152.8 lb

## 2016-01-02 DIAGNOSIS — Z09 Encounter for follow-up examination after completed treatment for conditions other than malignant neoplasm: Secondary | ICD-10-CM

## 2016-01-02 NOTE — Progress Notes (Signed)
Michele James is s/p lap chole by Dr. Michela PitcherEly and ERCP CBD stones She has a hx of cerebral palsy and her neurological cognitive sxs have worsened NO issues from abdominal perspective She is taking PO, no signs of biliary obst She is here for f/u regarding hernia, she had a [facility] repair as an infant. CT reviewed there might be a defect but   PE NAD Abd: soft, incisions well healed. It is difficult by PE to r/o or rule in an abdominal wall defect  A/P ? abd wall hernia, currently she has some deterioration of her mentation and I think is in her best interest to avoid any immediate surgical intervention Until her status improves.  Most prudent thin to do is re-evaluate her in 6 months and make a final determination about dx laparoscopy and repair of umbilical hernia vs observation

## 2016-01-02 NOTE — Patient Instructions (Signed)
Please call with any questions or concerns.

## 2016-01-30 ENCOUNTER — Ambulatory Visit
Admission: RE | Admit: 2016-01-30 | Discharge: 2016-01-30 | Disposition: A | Discharge status: Home / Self Care | Payer: Medicare Other | Source: Ambulatory Visit | Attending: Obstetrics and Gynecology | Admitting: Obstetrics and Gynecology

## 2016-01-30 DIAGNOSIS — Z1231 Encounter for screening mammogram for malignant neoplasm of breast: Secondary | ICD-10-CM

## 2016-01-30 NOTE — Discharge Instructions (Signed)

## 2016-02-04 ENCOUNTER — Encounter: Payer: Self-pay | Admitting: *Deleted

## 2016-02-06 ENCOUNTER — Encounter
Admission: RE | Disposition: A | Discharge status: Home / Self Care | Payer: Self-pay | Source: Ambulatory Visit | Attending: Ophthalmology

## 2016-02-06 ENCOUNTER — Ambulatory Visit
Admission: RE | Admit: 2016-02-06 | Discharge: 2016-02-06 | Disposition: A | Discharge status: Home / Self Care | Payer: Medicare Other | Source: Ambulatory Visit | Attending: Ophthalmology | Admitting: Ophthalmology

## 2016-02-06 ENCOUNTER — Ambulatory Visit: Payer: Medicare Other | Admitting: Anesthesiology

## 2016-02-06 DIAGNOSIS — F329 Major depressive disorder, single episode, unspecified: Secondary | ICD-10-CM | POA: Insufficient documentation

## 2016-02-06 DIAGNOSIS — H2511 Age-related nuclear cataract, right eye: Secondary | ICD-10-CM | POA: Diagnosis present

## 2016-02-06 DIAGNOSIS — E039 Hypothyroidism, unspecified: Secondary | ICD-10-CM | POA: Diagnosis not present

## 2016-02-06 DIAGNOSIS — Z79899 Other long term (current) drug therapy: Secondary | ICD-10-CM | POA: Insufficient documentation

## 2016-02-06 DIAGNOSIS — Z87891 Personal history of nicotine dependence: Secondary | ICD-10-CM | POA: Diagnosis not present

## 2016-02-06 HISTORY — PX: CATARACT EXTRACTION W/PHACO: SHX586

## 2016-02-06 SURGERY — PHACOEMULSIFICATION, CATARACT, WITH IOL INSERTION
Anesthesia: Monitor Anesthesia Care | Laterality: Right | Wound class: Clean

## 2016-02-06 MED ORDER — BRIMONIDINE TARTRATE 0.2 % OP SOLN
OPHTHALMIC | Status: DC | PRN
  Administered 2016-02-06: 1 [drp] via OPHTHALMIC

## 2016-02-06 MED ORDER — MOXIFLOXACIN HCL 0.5 % OP SOLN
1.0000 [drp] | OPHTHALMIC | Status: DC | PRN
Start: 2016-02-06 — End: 2016-02-06
  Administered 2016-02-06 (×3): 1 [drp] via OPHTHALMIC

## 2016-02-06 MED ORDER — TIMOLOL MALEATE 0.5 % OP SOLN
OPHTHALMIC | Status: DC | PRN
  Administered 2016-02-06: 1 [drp] via OPHTHALMIC

## 2016-02-06 MED ORDER — EPINEPHRINE PF 1 MG/ML IJ SOLN
INTRAMUSCULAR | Status: DC | PRN
  Administered 2016-02-06: 39 mL via OPHTHALMIC

## 2016-02-06 MED ORDER — CEFUROXIME OPHTHALMIC INJECTION 1 MG/0.1 ML: INJECTION | OPHTHALMIC | Status: DC | PRN

## 2016-02-06 MED ORDER — NA HYALUR & NA CHOND-NA HYALUR 0.4-0.35 ML IO KIT
PACK | INTRAOCULAR | Status: DC | PRN
  Administered 2016-02-06: 1 mL via INTRAOCULAR

## 2016-02-06 MED ORDER — LACTATED RINGERS IV SOLN: INTRAVENOUS | Status: DC

## 2016-02-06 MED ORDER — FENTANYL CITRATE (PF) 100 MCG/2ML IJ SOLN
INTRAMUSCULAR | Status: DC | PRN
  Administered 2016-02-06: 50 ug via INTRAVENOUS

## 2016-02-06 MED ORDER — LIDOCAINE HCL (PF) 4 % IJ SOLN
INTRAOCULAR | Status: DC | PRN
  Administered 2016-02-06: 1 mL via OPHTHALMIC

## 2016-02-06 MED ORDER — MOXIFLOXACIN HCL 0.5 % OP SOLN
OPHTHALMIC | Status: DC | PRN
  Administered 2016-02-06: .2 mL via OPHTHALMIC

## 2016-02-06 MED ORDER — ARMC OPHTHALMIC DILATING DROPS
1.0000 "application " | OPHTHALMIC | Status: DC | PRN
  Administered 2016-02-06 (×3): 1 via OPHTHALMIC

## 2016-02-06 MED ORDER — MIDAZOLAM HCL 2 MG/2ML IJ SOLN
INTRAMUSCULAR | Status: DC | PRN
  Administered 2016-02-06: 1 mg via INTRAVENOUS

## 2016-02-06 SURGICAL SUPPLY — 25 items
CANNULA ANT/CHMB 27GA (MISCELLANEOUS) ×3 IMPLANT
CARTRIDGE ABBOTT (MISCELLANEOUS) IMPLANT
GLOVE SURG LX 7.5 STRW (GLOVE) ×2
GLOVE SURG LX STRL 7.5 STRW (GLOVE) ×1 IMPLANT
GLOVE SURG TRIUMPH 8.0 PF LTX (GLOVE) ×3 IMPLANT
GOWN STRL REUS W/ TWL LRG LVL3 (GOWN DISPOSABLE) ×2 IMPLANT
GOWN STRL REUS W/TWL LRG LVL3 (GOWN DISPOSABLE) ×4
LENS IOL TECNIS ITEC 21.0 (Intraocular Lens) ×3 IMPLANT
MARKER SKIN DUAL TIP RULER LAB (MISCELLANEOUS) ×3 IMPLANT
NDL RETROBULBAR .5 NSTRL (NEEDLE) IMPLANT
NEEDLE FILTER BLUNT 18X 1/2SAF (NEEDLE) ×2
NEEDLE FILTER BLUNT 18X1 1/2 (NEEDLE) ×1 IMPLANT
PACK CATARACT BRASINGTON (MISCELLANEOUS) ×3 IMPLANT
PACK EYE AFTER SURG (MISCELLANEOUS) ×3 IMPLANT
PACK OPTHALMIC (MISCELLANEOUS) ×3 IMPLANT
RING MALYGIN 7.0 (MISCELLANEOUS) IMPLANT
SUT ETHILON 10-0 CS-B-6CS-B-6 (SUTURE)
SUT VICRYL  9 0 (SUTURE)
SUT VICRYL 9 0 (SUTURE) IMPLANT
SUTURE EHLN 10-0 CS-B-6CS-B-6 (SUTURE) IMPLANT
SYR 3ML LL SCALE MARK (SYRINGE) ×3 IMPLANT
SYR 5ML LL (SYRINGE) ×3 IMPLANT
SYR TB 1ML LUER SLIP (SYRINGE) ×3 IMPLANT
WATER STERILE IRR 250ML POUR (IV SOLUTION) ×3 IMPLANT
WIPE NON LINTING 3.25X3.25 (MISCELLANEOUS) ×3 IMPLANT

## 2016-02-06 NOTE — Op Note (Signed)
LOCATION:  [facility]   PREOPERATIVE DIAGNOSIS:    Nuclear sclerotic cataract right eye. 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     PROCEDURE:  Phacoemusification with posterior chamber intraocular lens placement of the right eye   LENS:   Implant Name Type Inv. Item Serial No. Manufacturer Lot No. LRB No. Used  LENS IOL DIOP 21.0 - [id number] Intraocular Lens LENS IOL DIOP 21.0 [id number]   Right 1        ULTRASOUND TIME: 21 % of 0 minutes, 41 seconds.  CDE 8.9   SURGEON:  Deirdre Evenerhadwick R. Delaine Hernandez, MD   ANESTHESIA:  Topical with tetracaine drops and 2% Xylocaine jelly, augmented with 1% preservative-free intracameral lidocaine.    COMPLICATIONS:  None.   DESCRIPTION OF PROCEDURE:  The patient was identified in the holding room and transported to the operating room and placed in the supine position under the operating microscope.  The right eye was identified as the operative eye and it was prepped and draped in the usual sterile ophthalmic fashion.   A 1 millimeter clear-corneal paracentesis was made at the 12:00 position.  0.5 ml of preservative-free 1% lidocaine was injected into the anterior chamber. The anterior chamber was filled with Viscoat viscoelastic.  A 2.4 millimeter keratome was used to make a near-clear corneal incision at the 9:00 position.  A curvilinear capsulorrhexis was made with a cystotome and capsulorrhexis forceps.  Balanced salt solution was used to hydrodissect and hydrodelineate the nucleus.   Phacoemulsification was then used in stop and chop fashion to remove the lens nucleus and epinucleus.  The remaining cortex was then removed using the irrigation and aspiration handpiece. Provisc was then placed into the capsular bag to distend it for lens placement.  A lens was then injected into the capsular bag.  The remaining viscoelastic was aspirated.   Wounds were hydrated with balanced salt solution.  The anterior  chamber was inflated to a physiologic pressure with balanced salt solution.  No wound leaks were noted. Vigamox 0.2 ml of a 1mg  per ml solution was injected into the anterior chamber for a dose of 0.2 mg of intracameral antibiotic at the completion of the case.   Timolol and Brimonidine drops were applied to the eye.  The patient was taken to the recovery room in stable condition without complications of anesthesia or surgery.   Karri Kallenbach 02/06/2016, 9:09 AM

## 2016-02-06 NOTE — Anesthesia Postprocedure Evaluation (Signed)
Anesthesia Post Note  Patient: Michele JumperKaren E James  Procedure(s) Performed: Procedure(s) (LRB): CATARACT EXTRACTION PHACO AND INTRAOCULAR LENS PLACEMENT (IOC) (Right)  Patient location during evaluation: PACU Anesthesia Type: MAC Level of consciousness: awake and alert Pain management: pain level controlled Vital Signs Assessment: post-procedure vital signs reviewed and stable Respiratory status: spontaneous breathing Cardiovascular status: blood pressure returned to baseline Postop Assessment: no headache Anesthetic complications: no    Verner Cholunkle, III,  Jamesha Ellsworth D

## 2016-02-06 NOTE — H&P (Signed)
The History and Physical notes are on paper, have been signed, and are to be scanned. The patient remains stable and unchanged from the H&P.   Previous H&P reviewed, patient examined, and there are no changes.  Michele James 02/06/2016 8:44 AM

## 2016-02-06 NOTE — Anesthesia Preprocedure Evaluation (Addendum)
Anesthesia Evaluation  Patient identified by MRN, date of birth, ID band Patient awake    Reviewed: Allergy & Precautions, NPO status , Patient's Chart, lab work & pertinent test results  History of Anesthesia Complications Negative for: history of anesthetic complications  Airway Mallampati: II  TM Distance: >3 FB Neck ROM: full    Dental no notable dental hx.    Pulmonary former smoker,    Pulmonary exam normal        Cardiovascular negative cardio ROS Normal cardiovascular exam     Neuro/Psych Seizures - (no seizures in 10 years),  Depression    GI/Hepatic Medicated,  Endo/Other  Hypothyroidism   Renal/GU      Musculoskeletal   Abdominal   Peds  Hematology   Anesthesia Other Findings   Reproductive/Obstetrics                            Anesthesia Physical Anesthesia Plan  ASA: III  Anesthesia Plan: MAC   Post-op Pain Management:    Induction:   Airway Management Planned:   Additional Equipment:   Intra-op Plan:   Post-operative Plan:   Informed Consent:   Plan Discussed with:   Anesthesia Plan Comments:         Anesthesia Quick Evaluation

## 2016-02-06 NOTE — Transfer of Care (Signed)
Immediate Anesthesia Transfer of Care Note  Patient: Michele James  Procedure(s) Performed: Procedure(s): CATARACT EXTRACTION PHACO AND INTRAOCULAR LENS PLACEMENT (IOC) (Right)  Patient Location: PACU  Anesthesia Type: MAC  Level of Consciousness: awake, alert  and patient cooperative  Airway and Oxygen Therapy: Patient Spontanous Breathing and Patient connected to supplemental oxygen  Post-op Assessment: Post-op Vital signs reviewed, Patient's Cardiovascular Status Stable, Respiratory Function Stable, Patent Airway and No signs of Nausea or vomiting  Post-op Vital Signs: Reviewed and stable  Complications: No apparent anesthesia complications
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
## 2016-03-04 NOTE — Discharge Instructions (Signed)
Cataract Surgery, Care After °Refer to this sheet in the next few weeks. These instructions provide you with information about caring for yourself after your procedure. Your health care provider may also give you more specific instructions. Your treatment has been planned according to current medical practices, but problems sometimes occur. Call your health care provider if you have any problems or questions after your procedure. °What can I expect after the procedure? °After the procedure, it is common to have: °· Itching. °· Discomfort. °· Fluid discharge. °· Sensitivity to light and to touch. °· Bruising. °Follow these instructions at home: °Eye Care  °· Check your eye every day for signs of infection. Watch for: °¨ Redness, swelling, or pain. °¨ Fluid, blood, or pus. °¨ Warmth. °¨ Bad smell. °Activity  °· Avoid strenuous activities, such as playing contact sports, for as long as told by your health care provider. °· Do not drive or operate heavy machinery until your health care provider approves. °· Do not bend or lift heavy objects . Bending increases pressure in the eye. You can walk, climb stairs, and do light household chores. °· Ask your health care provider when you can return to work. If you work in a dusty environment, you may be advised to wear protective eyewear for a period of time. °General instructions  °· Take or apply over-the-counter and prescription medicines only as told by your health care provider. This includes eye drops. °· Do not touch or rub your eyes. °· If you were given a protective shield, wear it as told by your health care provider. If you were not given a protective shield, wear sunglasses as told by your health care provider to protect your eyes. °· Keep the area around your eye clean and dry. Avoid swimming or allowing water to hit you directly in the face while showering until told by your health care provider. Keep soap and shampoo out of your eyes. °· Do not put a contact lens  into the affected eye or eyes until your health care provider approves. °· Keep all follow-up visits as told by your health care provider. This is important. °Contact a health care provider if: ° °· You have increased bruising around your eye. °· You have pain that is not helped with medicine. °· You have a fever. °· You have redness, swelling, or pain in your eye. °· You have fluid, blood, or pus coming from your incision. °· Your vision gets worse. °Get help right away if: °· You have sudden vision loss. °This information is not intended to replace advice given to you by your health care provider. Make sure you discuss any questions you have with your health care provider. °Document Released: 10/04/2004 Document Revised: 07/26/2015 Document Reviewed: 01/25/2015 °Elsevier Interactive Patient Education © 2017 [employer] ° ° ° ° °General Anesthesia, Adult, Care After °These instructions provide you with information about caring for yourself after your procedure. Your health care provider may also give you more specific instructions. Your treatment has been planned according to current medical practices, but problems sometimes occur. Call your health care provider if you have any problems or questions after your procedure. °What can I expect after the procedure? °After the procedure, it is common to have: °· Vomiting. °· A sore throat. °· Mental slowness. °It is common to feel: °· Nauseous. °· Cold or shivery. °· Sleepy. °· Tired. °· Sore or achy, even in parts of your body where you did not have surgery. °Follow these instructions at   home: °For at least 24 hours after the procedure:  °· Do not: °¨ Participate in activities where you could fall or become injured. °¨ Drive. °¨ Use heavy machinery. °¨ Drink alcohol. °¨ Take sleeping pills or medicines that cause drowsiness. °¨ Make important decisions or sign legal documents. °¨ Take care of children on your own. °· Rest. °Eating and drinking  °· If you vomit, drink  water, juice, or soup when you can drink without vomiting. °· Drink enough fluid to keep your urine clear or pale yellow. °· Make sure you have little or no nausea before eating solid foods. °· Follow the diet recommended by your health care provider. °General instructions  °· Have a responsible adult stay with you until you are awake and alert. °· Return to your normal activities as told by your health care provider. Ask your health care provider what activities are safe for you. °· Take over-the-counter and prescription medicines only as told by your health care provider. °· If you smoke, do not smoke without supervision. °· Keep all follow-up visits as told by your health care provider. This is important. °Contact a health care provider if: °· You continue to have nausea or vomiting at home, and medicines are not helpful. °· You cannot drink fluids or start eating again. °· You cannot urinate after 8-12 hours. °· You develop a skin rash. °· You have fever. °· You have increasing redness at the site of your procedure. °Get help right away if: °· You have difficulty breathing. °· You have chest pain. °· You have unexpected bleeding. °· You feel that you are having a life-threatening or urgent problem. °This information is not intended to replace advice given to you by your health care provider. Make sure you discuss any questions you have with your health care provider. °Document Released: 06/23/2000 Document Revised: 08/20/2015 Document Reviewed: 03/01/2015 °Elsevier Interactive Patient Education © 2017 [employer]vier Inc. ° °

## 2016-03-05 ENCOUNTER — Ambulatory Visit: Payer: Medicare Other | Admitting: Anesthesiology

## 2016-03-05 ENCOUNTER — Ambulatory Visit
Admission: RE | Admit: 2016-03-05 | Discharge: 2016-03-05 | Disposition: A | Discharge status: Home / Self Care | Payer: Medicare Other | Source: Ambulatory Visit | Attending: Ophthalmology | Admitting: Ophthalmology

## 2016-03-05 ENCOUNTER — Encounter
Admission: RE | Disposition: A | Discharge status: Home / Self Care | Payer: Self-pay | Source: Ambulatory Visit | Attending: Ophthalmology

## 2016-03-05 DIAGNOSIS — G809 Cerebral palsy, unspecified: Secondary | ICD-10-CM | POA: Diagnosis not present

## 2016-03-05 DIAGNOSIS — Z7983 Long term (current) use of bisphosphonates: Secondary | ICD-10-CM | POA: Insufficient documentation

## 2016-03-05 DIAGNOSIS — Z79899 Other long term (current) drug therapy: Secondary | ICD-10-CM | POA: Diagnosis not present

## 2016-03-05 DIAGNOSIS — E039 Hypothyroidism, unspecified: Secondary | ICD-10-CM | POA: Diagnosis not present

## 2016-03-05 DIAGNOSIS — H2512 Age-related nuclear cataract, left eye: Secondary | ICD-10-CM | POA: Diagnosis not present

## 2016-03-05 DIAGNOSIS — Z87891 Personal history of nicotine dependence: Secondary | ICD-10-CM | POA: Insufficient documentation

## 2016-03-05 DIAGNOSIS — K219 Gastro-esophageal reflux disease without esophagitis: Secondary | ICD-10-CM | POA: Insufficient documentation

## 2016-03-05 HISTORY — PX: CATARACT EXTRACTION W/PHACO: SHX586

## 2016-03-05 SURGERY — PHACOEMULSIFICATION, CATARACT, WITH IOL INSERTION
Anesthesia: Monitor Anesthesia Care | Laterality: Left | Wound class: Clean

## 2016-03-05 MED ORDER — TIMOLOL MALEATE 0.5 % OP SOLN
OPHTHALMIC | Status: DC | PRN
  Administered 2016-03-05: 1 [drp] via OPHTHALMIC

## 2016-03-05 MED ORDER — BRIMONIDINE TARTRATE 0.2 % OP SOLN
OPHTHALMIC | Status: DC | PRN
  Administered 2016-03-05: 1 [drp] via OPHTHALMIC

## 2016-03-05 MED ORDER — LIDOCAINE HCL (PF) 4 % IJ SOLN
INTRAOCULAR | Status: DC | PRN
  Administered 2016-03-05: 1 mL via OPHTHALMIC

## 2016-03-05 MED ORDER — MIDAZOLAM HCL 2 MG/2ML IJ SOLN
INTRAMUSCULAR | Status: DC | PRN
  Administered 2016-03-05: 1.5 mg via INTRAVENOUS

## 2016-03-05 MED ORDER — EPINEPHRINE PF 1 MG/ML IJ SOLN
INTRAOCULAR | Status: DC | PRN
  Administered 2016-03-05: 80 mL via OPHTHALMIC

## 2016-03-05 MED ORDER — LACTATED RINGERS IV SOLN: INTRAVENOUS | Status: DC

## 2016-03-05 MED ORDER — ARMC OPHTHALMIC DILATING DROPS
1.0000 "application " | OPHTHALMIC | Status: DC | PRN
  Administered 2016-03-05 (×3): 1 via OPHTHALMIC

## 2016-03-05 MED ORDER — MOXIFLOXACIN HCL 0.5 % OP SOLN
1.0000 [drp] | OPHTHALMIC | Status: DC | PRN
  Administered 2016-03-05 (×3): 1 [drp] via OPHTHALMIC

## 2016-03-05 MED ORDER — MOXIFLOXACIN HCL 0.5 % OP SOLN
OPHTHALMIC | Status: DC | PRN
  Administered 2016-03-05: 0.4 mL via OPHTHALMIC

## 2016-03-05 MED ORDER — FENTANYL CITRATE (PF) 100 MCG/2ML IJ SOLN
INTRAMUSCULAR | Status: DC | PRN
  Administered 2016-03-05: 50 ug via INTRAVENOUS

## 2016-03-05 MED ORDER — NA HYALUR & NA CHOND-NA HYALUR 0.4-0.35 ML IO KIT
PACK | INTRAOCULAR | Status: DC | PRN
  Administered 2016-03-05: 1 mL via INTRAOCULAR

## 2016-03-05 SURGICAL SUPPLY — 25 items
CANNULA ANT/CHMB 27GA (MISCELLANEOUS) ×3 IMPLANT
CARTRIDGE ABBOTT (MISCELLANEOUS) IMPLANT
GLOVE SURG LX 7.5 STRW (GLOVE) ×2
GLOVE SURG LX STRL 7.5 STRW (GLOVE) ×1 IMPLANT
GLOVE SURG TRIUMPH 8.0 PF LTX (GLOVE) ×3 IMPLANT
GOWN STRL REUS W/ TWL LRG LVL3 (GOWN DISPOSABLE) ×2 IMPLANT
GOWN STRL REUS W/TWL LRG LVL3 (GOWN DISPOSABLE) ×4
LENS IOL TECNIS ITEC 22.0 (Intraocular Lens) ×3 IMPLANT
MARKER SKIN DUAL TIP RULER LAB (MISCELLANEOUS) ×3 IMPLANT
NDL RETROBULBAR .5 NSTRL (NEEDLE) IMPLANT
NEEDLE FILTER BLUNT 18X 1/2SAF (NEEDLE) ×2
NEEDLE FILTER BLUNT 18X1 1/2 (NEEDLE) ×1 IMPLANT
PACK CATARACT BRASINGTON (MISCELLANEOUS) ×3 IMPLANT
PACK EYE AFTER SURG (MISCELLANEOUS) ×3 IMPLANT
PACK OPTHALMIC (MISCELLANEOUS) ×3 IMPLANT
RING MALYGIN 7.0 (MISCELLANEOUS) IMPLANT
SUT ETHILON 10-0 CS-B-6CS-B-6 (SUTURE)
SUT VICRYL  9 0 (SUTURE)
SUT VICRYL 9 0 (SUTURE) IMPLANT
SUTURE EHLN 10-0 CS-B-6CS-B-6 (SUTURE) IMPLANT
SYR 3ML LL SCALE MARK (SYRINGE) ×3 IMPLANT
SYR 5ML LL (SYRINGE) ×3 IMPLANT
SYR TB 1ML LUER SLIP (SYRINGE) ×3 IMPLANT
WATER STERILE IRR 250ML POUR (IV SOLUTION) ×3 IMPLANT
WIPE NON LINTING 3.25X3.25 (MISCELLANEOUS) ×3 IMPLANT

## 2016-03-05 NOTE — Anesthesia Procedure Notes (Signed)
Procedure Name: MAC Performed by: Rifky Lapre Pre-anesthesia Checklist: Patient identified, Emergency Drugs available, Suction available, Timeout performed and Patient being monitored Patient Re-evaluated:Patient Re-evaluated prior to inductionOxygen Delivery Method: Nasal cannula Placement Confirmation: positive ETCO2       

## 2016-03-05 NOTE — Anesthesia Postprocedure Evaluation (Signed)
Anesthesia Post Note  Patient: Romie JumperKaren E Zimbelman  Procedure(s) Performed: Procedure(s) (LRB): CATARACT EXTRACTION PHACO AND INTRAOCULAR LENS PLACEMENT (IOC) (Left)  Patient location during evaluation: PACU Anesthesia Type: MAC Level of consciousness: awake and alert and oriented Pain management: satisfactory to patient Vital Signs Assessment: post-procedure vital signs reviewed and stable Respiratory status: spontaneous breathing, nonlabored ventilation and respiratory function stable Cardiovascular status: blood pressure returned to baseline and stable Postop Assessment: Adequate PO intake and No signs of nausea or vomiting Anesthetic complications: no    Cherly BeachStella, Zayn Selley J

## 2016-03-05 NOTE — H&P (Signed)
The History and Physical notes are on paper, have been signed, and are to be scanned. The patient remains stable and unchanged from the H&P.   Previous H&P reviewed, patient examined, and there are no changes.  Michele James 03/05/2016 7:43 AM   

## 2016-03-05 NOTE — Transfer of Care (Signed)
Immediate Anesthesia Transfer of Care Note  Patient: Michele JumperKaren E James  Procedure(s) Performed: Procedure(s): CATARACT EXTRACTION PHACO AND INTRAOCULAR LENS PLACEMENT (IOC) (Left)  Patient Location: PACU  Anesthesia Type: MAC  Level of Consciousness: awake, alert  and patient cooperative  Airway and Oxygen Therapy: Patient Spontanous Breathing and Patient connected to supplemental oxygen  Post-op Assessment: Post-op Vital signs reviewed, Patient's Cardiovascular Status Stable, Respiratory Function Stable, Patent Airway and No signs of Nausea or vomiting  Post-op Vital Signs: Reviewed and stable  Complications: No apparent anesthesia complications

## 2016-03-05 NOTE — Op Note (Signed)
OPERATIVE NOTE  Michele James [id number] 03/05/2016   PREOPERATIVE DIAGNOSIS:  Nuclear sclerotic cataract left eye. H25.12   POSTOPERATIVE DIAGNOSIS:    Nuclear sclerotic cataract left eye.     PROCEDURE:  Phacoemusification with posterior chamber intraocular lens placement of the left eye   LENS:   Implant Name Type Inv. Item Serial No. Manufacturer Lot No. LRB No. Used  LENS IOL DIOP 22.0 - [id number] Intraocular Lens LENS IOL DIOP 22.0 [id number]   Left 1        ULTRASOUND TIME: 11  % of 0 minutes 59 seconds, CDE 6.4  SURGEON:  Deirdre Evenerhadwick R. Dina Mobley, MD   ANESTHESIA:  Topical with tetracaine drops and 2% Xylocaine jelly, augmented with 1% preservative-free intracameral lidocaine.    COMPLICATIONS:  None.   DESCRIPTION OF PROCEDURE:  The patient was identified in the holding room and transported to the operating room and placed in the supine position under the operating microscope.  The left eye was identified as the operative eye and it was prepped and draped in the usual sterile ophthalmic fashion.   A 1 millimeter clear-corneal paracentesis was made at the 1:30 position.  0.5 ml of preservative-free 1% lidocaine was injected into the anterior chamber.  The anterior chamber was filled with Viscoat viscoelastic.  A 2.4 millimeter keratome was used to make a near-clear corneal incision at the 10:30 position.  .  A curvilinear capsulorrhexis was made with a cystotome and capsulorrhexis forceps.  Balanced salt solution was used to hydrodissect and hydrodelineate the nucleus.   Phacoemulsification was then used in stop and chop fashion to remove the lens nucleus and epinucleus.  The remaining cortex was then removed using the irrigation and aspiration handpiece. Provisc was then placed into the capsular bag to distend it for lens placement.  A lens was then injected into the capsular bag.  The remaining viscoelastic was aspirated.   Wounds were hydrated with balanced salt  solution.  The anterior chamber was inflated to a physiologic pressure with balanced salt solution.  No wound leaks were noted. Vigamox 0.2 ml of a 1mg  per ml solution was injected into the anterior chamber for a dose of 0.2 mg of intracameral antibiotic at the completion of the case.   Timolol and Brimonidine drops were applied to the eye.  The patient was taken to the recovery room in stable condition without complications of anesthesia or surgery.  [name] 03/05/2016, 8:08 AM

## 2016-03-05 NOTE — Anesthesia Preprocedure Evaluation (Signed)
Anesthesia Evaluation  Patient identified by MRN, date of birth, ID band Patient awake    Reviewed: Allergy & Precautions, H&P , NPO status , Patient's Chart, lab work & pertinent test results  History of Anesthesia Complications Negative for: history of anesthetic complications  Airway Mallampati: II  TM Distance: >3 FB Neck ROM: full    Dental no notable dental hx.    Pulmonary former smoker,    Pulmonary exam normal        Cardiovascular negative cardio ROS Normal cardiovascular exam     Neuro/Psych Seizures - (no seizures in 10 years),     GI/Hepatic   Endo/Other  Hypothyroidism   Renal/GU      Musculoskeletal   Abdominal   Peds  Hematology   Anesthesia Other Findings   Reproductive/Obstetrics                             Anesthesia Physical  Anesthesia Plan  ASA: II  Anesthesia Plan: MAC   Post-op Pain Management:    Induction:   Airway Management Planned:   Additional Equipment:   Intra-op Plan:   Post-operative Plan:   Informed Consent: I have reviewed the patients History and Physical, chart, labs and discussed the procedure including the risks, benefits and alternatives for the proposed anesthesia with the patient or authorized representative who has indicated his/her understanding and acceptance.     Plan Discussed with:   Anesthesia Plan Comments:         Anesthesia Quick Evaluation

## 2016-03-06 ENCOUNTER — Encounter: Payer: Self-pay | Admitting: Ophthalmology

## 2016-04-22 ENCOUNTER — Emergency Department
Admission: EM | Admit: 2016-04-22 | Discharge: 2016-04-22 | Disposition: A | Discharge status: Home / Self Care | Payer: Medicare Other | Attending: Emergency Medicine | Admitting: Emergency Medicine

## 2016-04-22 DIAGNOSIS — G8929 Other chronic pain: Secondary | ICD-10-CM | POA: Insufficient documentation

## 2016-04-22 DIAGNOSIS — Z87891 Personal history of nicotine dependence: Secondary | ICD-10-CM | POA: Insufficient documentation

## 2016-04-22 DIAGNOSIS — M79674 Pain in right toe(s): Secondary | ICD-10-CM | POA: Diagnosis not present

## 2016-04-22 DIAGNOSIS — E039 Hypothyroidism, unspecified: Secondary | ICD-10-CM | POA: Insufficient documentation

## 2016-04-22 DIAGNOSIS — L600 Ingrowing nail: Secondary | ICD-10-CM | POA: Diagnosis present

## 2016-04-22 NOTE — ED Notes (Signed)
Pt signed discharge instructions e-signature pad did not record signature

## 2016-04-22 NOTE — ED Notes (Signed)
Pt verbalizes understanding of discharge instructions, denies any questions at present

## 2016-04-22 NOTE — ED Notes (Signed)
Pt. States Rt great toe red and tender to touch this am. Denies drainage. Pt. States presenting color now different from this am. Pt. States called 911 d/t "toe was red, looks like the toenail came out, turned around, and went back in"

## 2016-04-22 NOTE — ED Triage Notes (Signed)
Pt presents to ED via [facility] from home with c/o an ingrown toenail affecting the great toe on her RIGHT foot. Pt's toe is swollen; pt states it was red this morning.

## 2016-04-23 NOTE — ED Provider Notes (Signed)
[facility] Emergency Department Provider Note  ____________________________________________  Time seen: Approximately 12:21 AM  I have reviewed the triage vital signs and the nursing notes.   HISTORY  Chief Complaint Ingrown Toenail    HPI Michele James is a 61 y.o. female with a history of cerebral palsy presents to the emergency department via EMS with a self perceived ingrown toenail. Patient has noticed no increased pain with palpation of the great toe right. Triage note noted. Patient states that right great toe was erythematous this morning but has since resolved. She has been afebrile. She is under the care of podiatry for chronic toenail management. Right great toe pain is currently 4/10 in intensity and characterized as "aching". No alleviating measures have been attempted.    Past Medical History:  Diagnosis Date  . Arthritis    knees  . Cerebral palsy (HCC)   . Depression   . Dysphagia   . Eczema   . Glaucoma   . Hemorrhoids   . Hypothyroidism   . Seizures (HCC)    none in over 10 yrs    Patient Active Problem List   Diagnosis Date Noted  . Calculus of common duct   . Elevated bilirubin   . Dilated cbd, acquired   . Cholecystitis with cholelithiasis 11/15/2015  . RUQ pain   . Cerebral palsy (HCC) 11/08/2014  . 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 Hypothyroidism (acquired) 05/08/2014  . Depression 10/17/2013  . GERD (gastroesophageal reflux disease) 10/17/2013  . Seizure (HCC) 10/17/2013  . PMB (postmenopausal bleeding) 07/07/2013    Past Surgical History:  Procedure Laterality Date  . CATARACT EXTRACTION W/PHACO Right 02/06/2016   Procedure: CATARACT EXTRACTION PHACO AND INTRAOCULAR LENS PLACEMENT (IOC);  Surgeon: Lockie Molahadwick Brasington, MD;  [facility];  Service: Ophthalmology;  Laterality: Right;  . CATARACT EXTRACTION W/PHACO Left 03/05/2016   Procedure: CATARACT EXTRACTION PHACO AND INTRAOCULAR LENS  PLACEMENT (IOC);  Surgeon: Lockie Molahadwick Brasington, MD;  [facility];  Service: Ophthalmology;  Laterality: Left;  . CHOLECYSTECTOMY N/A 11/16/2015   Procedure: LAPAROSCOPIC CHOLECYSTECTOMY;  Surgeon: Tiney Rougealph Ely III, MD;  Location: [facility] ORS;  Service: General;  Laterality: N/A;  attempted cholangiogram  . COLONOSCOPY WITH PROPOFOL N/A 12/25/2014   Procedure: COLONOSCOPY WITH PROPOFOL;  Surgeon: Wallace CullensPaul Y Oh, MD;  [facility];  Service: Gastroenterology;  Laterality: N/A;  . ENDOSCOPIC RETROGRADE CHOLANGIOPANCREATOGRAPHY (ERCP) WITH PROPOFOL N/A 11/18/2015   Procedure: ENDOSCOPIC RETROGRADE CHOLANGIOPANCREATOGRAPHY (ERCP) WITH PROPOFOL;  Surgeon: Midge Miniumarren Wohl, MD;  Location: [facility] ENDOSCOPY;  Service: Endoscopy;  Laterality: N/A;  . HERNIA REPAIR      Prior to Admission medications   Medication Sig Start Date End Date Taking? Authorizing Provider  acetaminophen (TYLENOL) 325 MG tablet Take 325 mg by mouth every 6 (six) hours as needed.    Historical Provider, MD  albuterol (PROVENTIL HFA;VENTOLIN HFA) 108 (90 Base) MCG/ACT inhaler Inhale 2 puffs into the lungs. 06/05/15 06/04/16  Historical Provider, MD  alendronate (FOSAMAX) 70 MG tablet Take 70 mg by mouth once a week. Take with a full glass of water on an empty stomach.    Historical Provider, MD  ALPRAZolam Prudy Feeler(XANAX) 0.25 MG tablet Take 0.25 mg by mouth as needed for anxiety. Before pap smears    Historical Provider, MD  betamethasone valerate (VALISONE) 0.1 % cream Apply topically 2 (two) times daily as needed.    Historical Provider, MD  calcium citrate-vitamin D (CITRACAL+D) 315-200 MG-UNIT per tablet Take 1 tablet by mouth 2 (two)  times daily.    Historical Provider, MD  carbamazepine (TEGRETOL) 200 MG tablet Take 200 mg by mouth 3 (three) times daily.    Historical Provider, MD  ergocalciferol (VITAMIN D2) 50000 UNITS capsule Take 50,000 Units by mouth once a week.    Historical Provider, MD  esomeprazole (NEXIUM) 40 MG  capsule Take 40 mg by mouth daily at 12 noon.    Historical Provider, MD  fexofenadine (ALLEGRA) 180 MG tablet Take 180 mg by mouth daily.    Historical Provider, MD  levothyroxine (SYNTHROID, LEVOTHROID) 125 MCG tablet Take 125 mcg by mouth daily before breakfast.    Historical Provider, MD  naproxen (NAPROSYN) 375 MG tablet Take 375 mg by mouth 2 (two) times daily with a meal.    Historical Provider, MD  OLANZapine (ZYPREXA) 5 MG tablet Take 5 mg by mouth at bedtime.    Historical Provider, MD  PARoxetine (PAXIL) 20 MG tablet Take 20 mg by mouth daily.    Historical Provider, MD  potassium chloride 20 MEQ/15ML (10%) SOLN Take 10 mEq by mouth daily.    Historical Provider, MD  ranitidine (ZANTAC) 300 MG tablet Take 300 mg by mouth at bedtime.    Historical Provider, MD  vitamin E 400 UNIT capsule Take 400 Units by mouth daily.    Historical Provider, MD  Wheat Dextrin (BENEFIBER PO) Take by mouth.    Historical Provider, MD    Allergies Penicillins; Potassium; Potassium-containing compounds; Theophyllines; Advil [ibuprofen]; Amoxicillin; Nsaids; Septra [sulfamethoxazole-trimethoprim]; Sulfa antibiotics; and Tape  Family History  Problem Relation Age of Onset  . Breast cancer Maternal Aunt   . Breast cancer Maternal Aunt     Social History Social History  Substance Use Topics  . Smoking status: Former Games developer  . Smokeless tobacco: Never Used     Comment: smoked socially in early 1980s  . Alcohol use No    Review of Systems  Constitutional: No fever/chills Cardiovascular: no chest pain. Respiratory: no cough. No SOB. Skin: Patient has a self perceived ingrown toenail. Neurological: Negative for headaches, focal weakness or numbness. ____________________________________________   PHYSICAL EXAM:  VITAL SIGNS: ED Triage Vitals  Enc Vitals Group     BP 04/22/16 1851 128/67     Pulse Rate 04/22/16 1851 86     Resp 04/22/16 1851 18     Temp 04/22/16 2059 98.4 F (36.9 C)      Temp src --      SpO2 04/22/16 1851 95 %     Weight 04/22/16 1851 160 lb (72.6 kg)     Height 04/22/16 1851 5\' 1"  (1.549 m)     Head Circumference --      Peak Flow --      Pain Score 04/22/16 1902 0     Pain Loc --      Pain Edu? --      Excl. in GC? --     Constitutional: Alert and oriented. Well appearing and in no acute distress. Cardiovascular: Normal rate, regular rhythm. Normal S1 and S2.  Good peripheral circulation. Respiratory: Normal respiratory effort without tachypnea or retractions. Lungs CTAB. Good air entry to the bases with no decreased or absent breath sounds. Musculoskeletal: Patient has no pain to palpation of the right great toe. Neurologic:  Normal speech and language. No gross focal neurologic deficits are appreciated.  Skin: Skin overlying the right great toe is non-erythematous and without edema. Toenail edges are smooth and demarcated without jagged edges. Right and left great toe  nails appear symmetrical. Psychiatric: Mood and affect are normal. Speech and behavior are normal. Patient exhibits appropriate insight and judgement. ____________________________________________   LABS (all labs ordered are listed, but only abnormal results are displayed)  Labs Reviewed - No data to display ____________________________________________  EKG   ____________________________________________  RADIOLOGY  No results found.  ____________________________________________    PROCEDURES  Procedure(s) performed:    Procedures    Medications - No data to display   ____________________________________________   INITIAL IMPRESSION / ASSESSMENT AND PLAN / ED COURSE  Pertinent labs & imaging results that were available during my care of the patient were reviewed by me and considered in my medical decision making (see chart for details).  Review of the [place] CSRS was performed in accordance of the [facility] prior to dispensing any controlled drugs.      Assessment and Plan: Toe pain, right Patient presents to the emergency department with a self perceived ingrown toenail of the great toe, right. On physical exam, right great toe is nonerythematous and without edema. Toenail edges were sharply demarcated without jagged edges. No pain could be elicited with palpation of the great toe, right. Patient is currently under the care of podiatry for chronic toenail management. Patient was referred to podiatry, Dr. Orland Jarred. All patient questions were answered. ____________________________________________  FINAL CLINICAL IMPRESSION(S) / ED DIAGNOSES  Final diagnoses:  Toe pain, chronic, right      NEW MEDICATIONS STARTED DURING THIS VISIT:  Discharge Medication List as of 04/22/2016  8:46 PM          This chart was dictated using voice recognition software/Dragon. Despite best efforts to proofread, errors can occur which can change the meaning. Any change was purely unintentional.    Orvil Feil, PA-C 04/23/16 1436    Myrna Blazer, MD 04/30/16 2351

## 2016-07-01 ENCOUNTER — Other Ambulatory Visit: Payer: Self-pay | Admitting: Obstetrics and Gynecology

## 2016-07-01 DIAGNOSIS — Z1231 Encounter for screening mammogram for malignant neoplasm of breast: Secondary | ICD-10-CM

## 2017-02-16 ENCOUNTER — Ambulatory Visit
Admission: RE | Admit: 2017-02-16 | Discharge: 2017-02-16 | Disposition: A | Discharge status: Home / Self Care | Payer: Medicare Other | Source: Ambulatory Visit | Attending: Obstetrics and Gynecology | Admitting: Obstetrics and Gynecology

## 2017-02-16 DIAGNOSIS — Z1231 Encounter for screening mammogram for malignant neoplasm of breast: Secondary | ICD-10-CM | POA: Diagnosis not present

## 2017-02-18 ENCOUNTER — Other Ambulatory Visit: Payer: Self-pay | Admitting: Obstetrics and Gynecology

## 2017-02-18 DIAGNOSIS — R928 Other abnormal and inconclusive findings on diagnostic imaging of breast: Secondary | ICD-10-CM

## 2017-02-18 DIAGNOSIS — N6320 Unspecified lump in the left breast, unspecified quadrant: Secondary | ICD-10-CM

## 2017-03-05 ENCOUNTER — Ambulatory Visit: Payer: Medicare Other

## 2017-03-05 ENCOUNTER — Other Ambulatory Visit: Payer: Medicare Other

## 2017-03-09 ENCOUNTER — Other Ambulatory Visit: Payer: Medicare Other

## 2017-03-09 ENCOUNTER — Ambulatory Visit: Payer: Medicare Other

## 2017-03-19 ENCOUNTER — Ambulatory Visit
Admission: RE | Admit: 2017-03-19 | Discharge: 2017-03-19 | Disposition: A | Discharge status: Home / Self Care | Payer: Medicare Other | Source: Ambulatory Visit | Attending: Obstetrics and Gynecology | Admitting: Obstetrics and Gynecology

## 2017-03-19 DIAGNOSIS — R928 Other abnormal and inconclusive findings on diagnostic imaging of breast: Secondary | ICD-10-CM

## 2017-03-19 DIAGNOSIS — N6320 Unspecified lump in the left breast, unspecified quadrant: Secondary | ICD-10-CM | POA: Diagnosis present

## 2017-03-19 DIAGNOSIS — N6321 Unspecified lump in the left breast, upper outer quadrant: Secondary | ICD-10-CM | POA: Insufficient documentation

## 2017-12-23 ENCOUNTER — Other Ambulatory Visit: Payer: Self-pay | Admitting: Family Medicine

## 2017-12-23 DIAGNOSIS — Z1231 Encounter for screening mammogram for malignant neoplasm of breast: Secondary | ICD-10-CM

## 2017-12-23 DIAGNOSIS — E119 Type 2 diabetes mellitus without complications: Secondary | ICD-10-CM

## 2018-02-23 ENCOUNTER — Ambulatory Visit
Admission: RE | Admit: 2018-02-23 | Discharge: 2018-02-23 | Disposition: A | Discharge status: Home / Self Care | Payer: Medicare Other | Source: Ambulatory Visit | Attending: Family Medicine | Admitting: Family Medicine

## 2018-02-23 DIAGNOSIS — Z1231 Encounter for screening mammogram for malignant neoplasm of breast: Secondary | ICD-10-CM

## 2018-05-23 IMAGING — MG 2D DIGITAL DIAGNOSTIC UNILATERAL LEFT MAMMOGRAM WITH CAD AND ADJ
9 series · 9 of 21 positions shown · non-contrast
Comparison: Previous exam(s).

CLINICAL DATA: Screening recall for a left breast mass.

EXAM:
2D DIGITAL DIAGNOSTIC UNILATERAL LEFT MAMMOGRAM WITH CAD AND ADJUNCT
TOMO

[L CC synth-2D (1 of 2)]
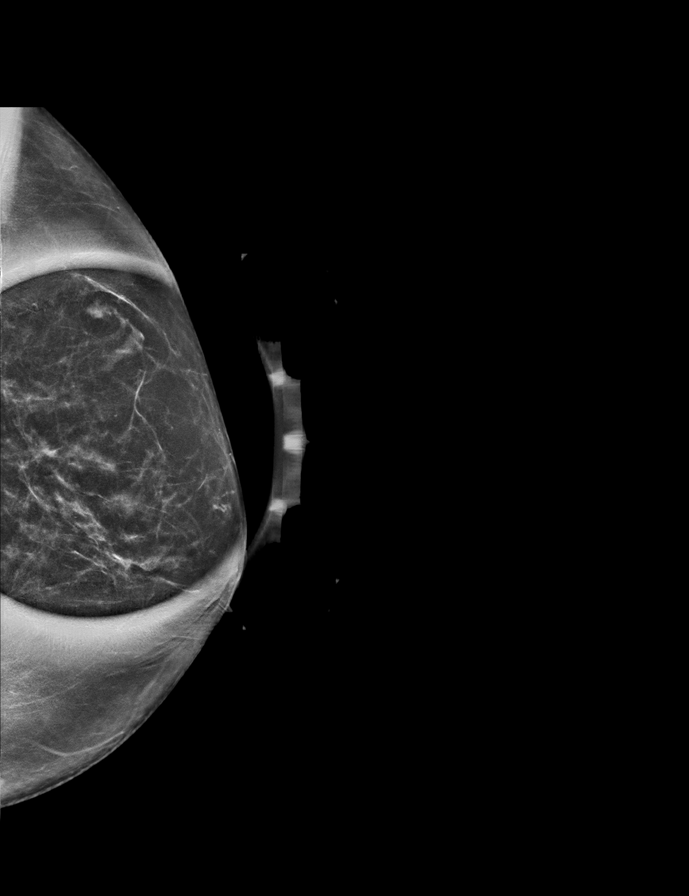

[L LM]
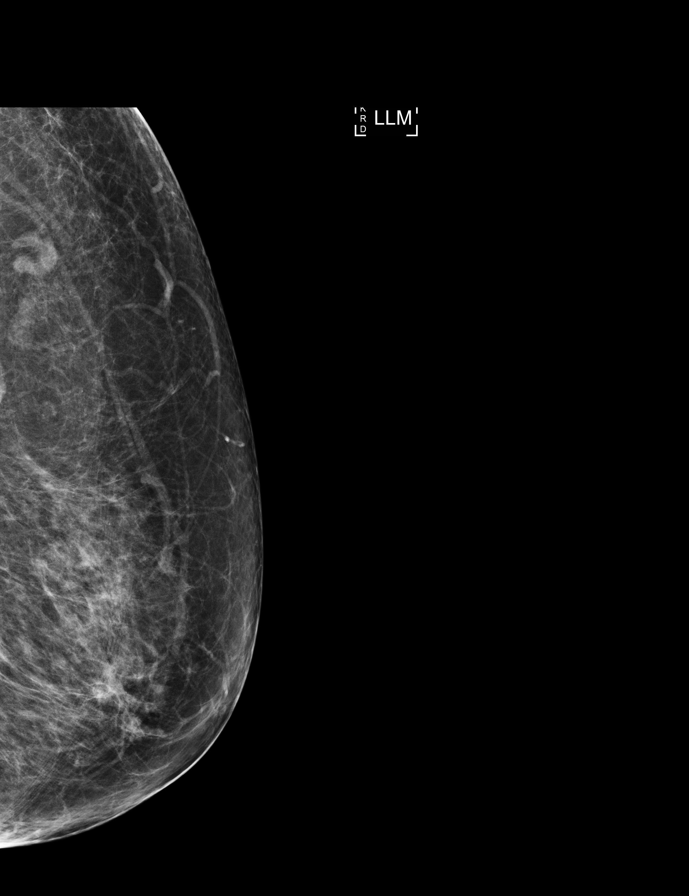

[L CC (1 of 2)]
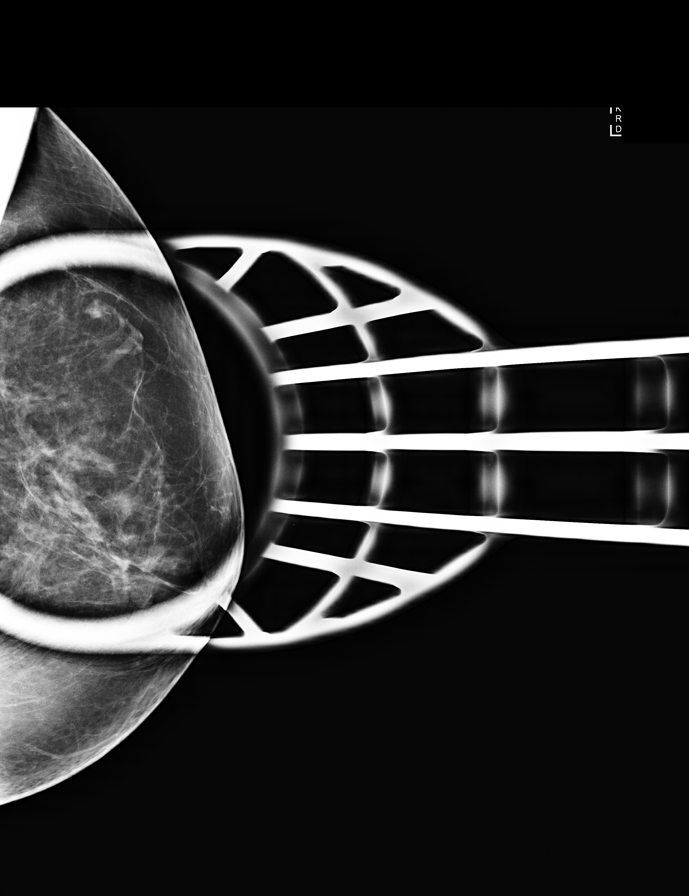

[L LM synth-2D]
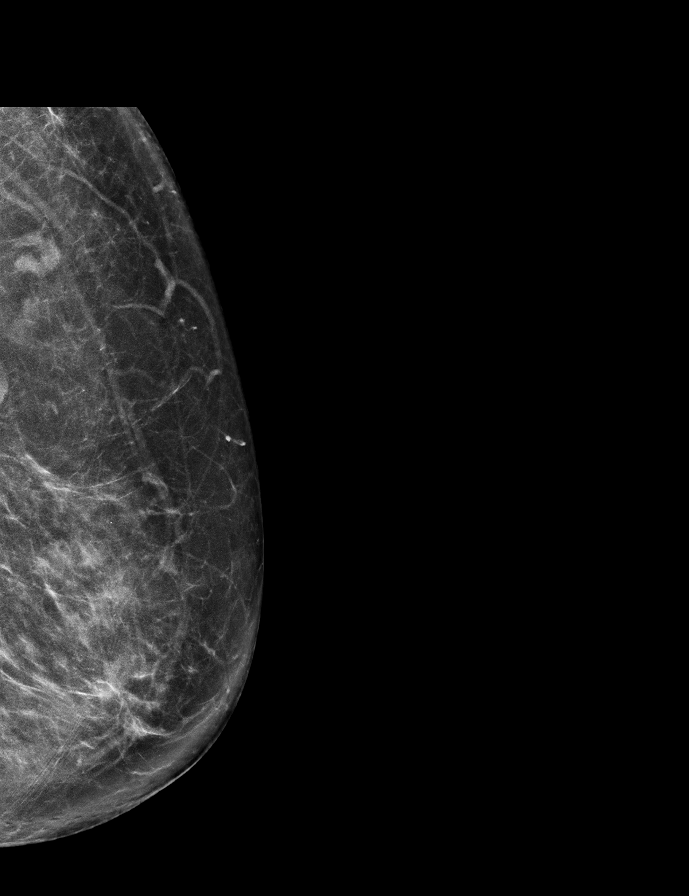

[L CC (2 of 2)]
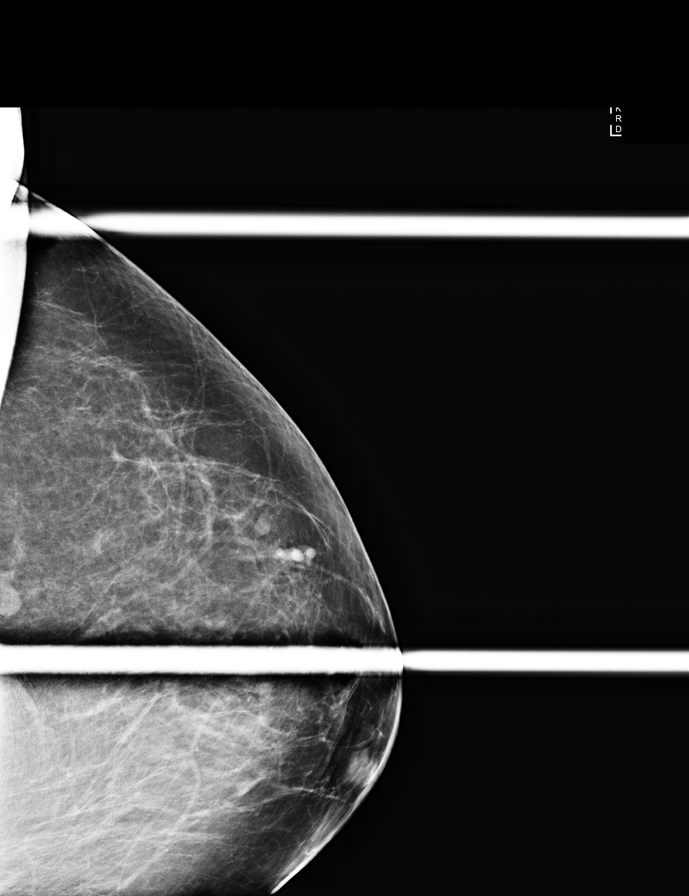

[L CC synth-2D (2 of 2)]
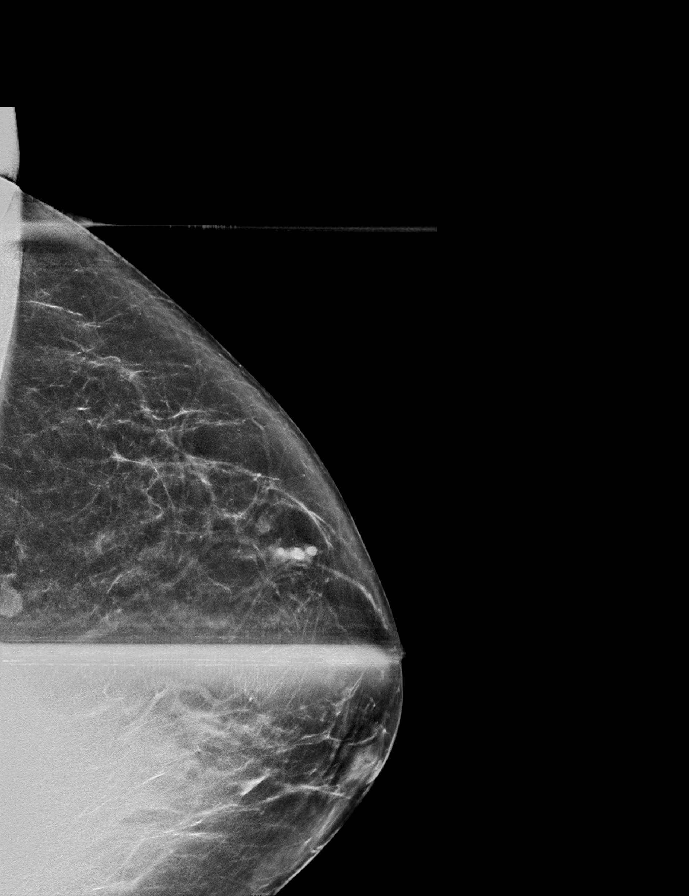

[L CC tomo (1 of 2) · tomo slice 31/60.0]
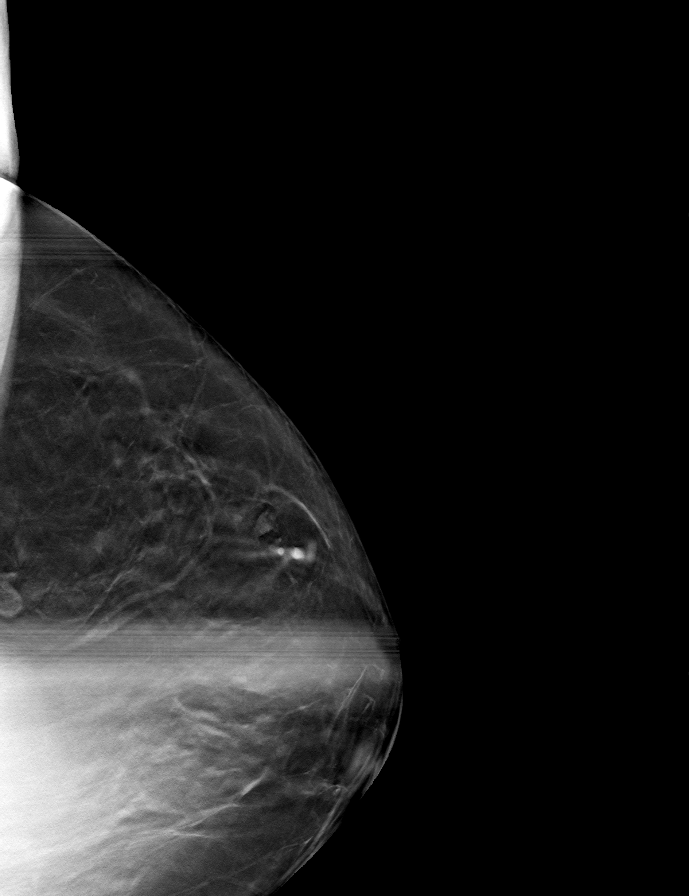

[L LM tomo · tomo slice 37/72.0]
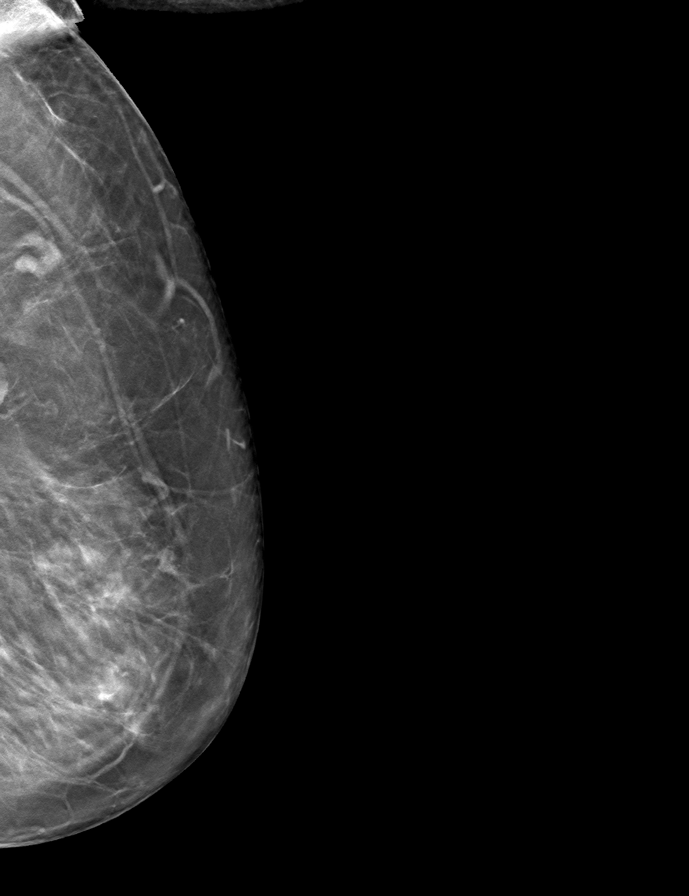

[L CC tomo (2 of 2) · tomo slice 29/56.0]
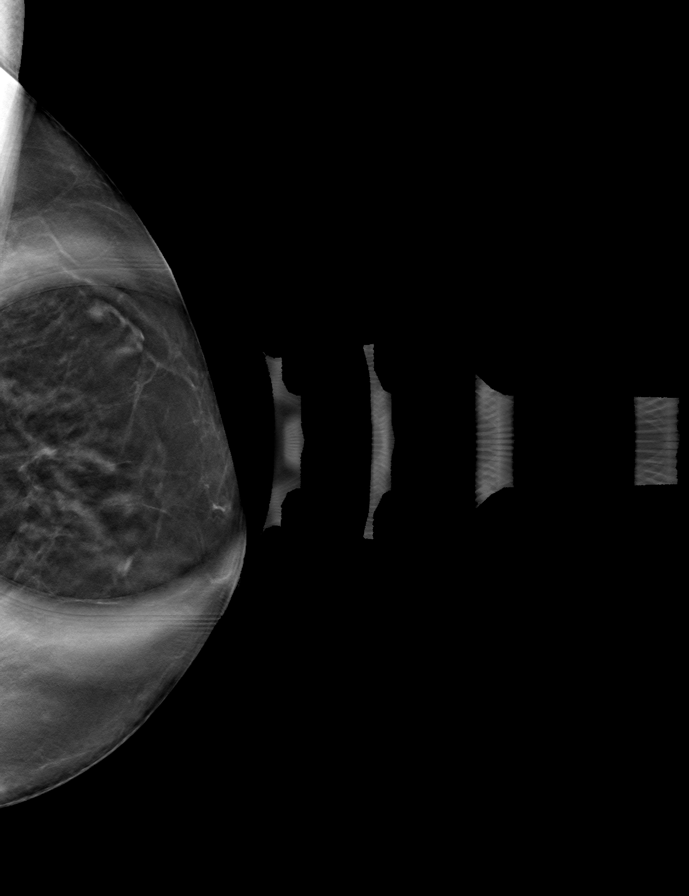

[9 of 21 positions shown; findings below may reference images not displayed]

ACR Breast Density Category b: There are scattered areas of
fibroglandular density.
FINDINGS: The mass identified in the upper-outer quadrant of the left breast
corresponds with a benign lymph node, which has been stable as
compared to a mammogram from 9772.

Mammographic images were processed with CAD.
IMPRESSION: The mass in the upper-outer left breast corresponds with a benign
lymph node.

RECOMMENDATION:
Screening mammogram in one year.(Code:[id number])

I have discussed the findings and recommendations with the patient.
Results were also provided in writing at the conclusion of the
visit. If applicable, a reminder letter will be sent to the patient
regarding the next appointment.

BI-RADS CATEGORY  1: Negative.

## 2019-01-13 ENCOUNTER — Other Ambulatory Visit: Payer: Self-pay | Admitting: Family Medicine

## 2019-01-13 DIAGNOSIS — Z1231 Encounter for screening mammogram for malignant neoplasm of breast: Secondary | ICD-10-CM

## 2019-02-28 ENCOUNTER — Ambulatory Visit
Admission: RE | Admit: 2019-02-28 | Discharge: 2019-02-28 | Disposition: A | Discharge status: Home / Self Care | Payer: Medicare Other | Source: Ambulatory Visit | Attending: Family Medicine | Admitting: Family Medicine

## 2019-02-28 DIAGNOSIS — Z1231 Encounter for screening mammogram for malignant neoplasm of breast: Secondary | ICD-10-CM | POA: Insufficient documentation

## 2019-04-12 ENCOUNTER — Other Ambulatory Visit
Admission: RE | Admit: 2019-04-12 | Discharge: 2019-04-12 | Disposition: A | Discharge status: Home / Self Care | Payer: Medicare Other | Source: Ambulatory Visit | Attending: Internal Medicine | Admitting: Internal Medicine

## 2019-04-12 DIAGNOSIS — Z20822 Contact with and (suspected) exposure to covid-19: Secondary | ICD-10-CM | POA: Insufficient documentation

## 2019-04-12 DIAGNOSIS — Z01812 Encounter for preprocedural laboratory examination: Secondary | ICD-10-CM | POA: Diagnosis present

## 2019-04-12 LAB — SARS CORONAVIRUS 2 (TAT 6-24 HRS): SARS Coronavirus 2: NEGATIVE

## 2019-04-13 ENCOUNTER — Encounter: Payer: Self-pay | Admitting: Internal Medicine

## 2019-04-14 ENCOUNTER — Encounter
Admission: RE | Disposition: A | Discharge status: Home / Self Care | Payer: Self-pay | Source: Home / Self Care | Attending: Internal Medicine

## 2019-04-14 ENCOUNTER — Ambulatory Visit: Payer: Medicare Other | Admitting: Certified Registered Nurse Anesthetist

## 2019-04-14 ENCOUNTER — Other Ambulatory Visit: Payer: Self-pay

## 2019-04-14 ENCOUNTER — Encounter: Payer: Self-pay | Admitting: Internal Medicine

## 2019-04-14 ENCOUNTER — Ambulatory Visit
Admission: RE | Admit: 2019-04-14 | Discharge: 2019-04-14 | Disposition: A | Discharge status: Home / Self Care | Payer: Medicare Other | Attending: Internal Medicine | Admitting: Internal Medicine

## 2019-04-14 DIAGNOSIS — Z886 Allergy status to analgesic agent status: Secondary | ICD-10-CM | POA: Diagnosis not present

## 2019-04-14 DIAGNOSIS — Z79890 Hormone replacement therapy (postmenopausal): Secondary | ICD-10-CM | POA: Insufficient documentation

## 2019-04-14 DIAGNOSIS — Z8719 Personal history of other diseases of the digestive system: Secondary | ICD-10-CM | POA: Diagnosis not present

## 2019-04-14 DIAGNOSIS — M170 Bilateral primary osteoarthritis of knee: Secondary | ICD-10-CM | POA: Diagnosis not present

## 2019-04-14 DIAGNOSIS — Z79899 Other long term (current) drug therapy: Secondary | ICD-10-CM | POA: Insufficient documentation

## 2019-04-14 DIAGNOSIS — Z7984 Long term (current) use of oral hypoglycemic drugs: Secondary | ICD-10-CM | POA: Diagnosis not present

## 2019-04-14 DIAGNOSIS — D500 Iron deficiency anemia secondary to blood loss (chronic): Secondary | ICD-10-CM | POA: Insufficient documentation

## 2019-04-14 DIAGNOSIS — Z7983 Long term (current) use of bisphosphonates: Secondary | ICD-10-CM | POA: Insufficient documentation

## 2019-04-14 DIAGNOSIS — K449 Diaphragmatic hernia without obstruction or gangrene: Secondary | ICD-10-CM | POA: Diagnosis not present

## 2019-04-14 DIAGNOSIS — F329 Major depressive disorder, single episode, unspecified: Secondary | ICD-10-CM | POA: Insufficient documentation

## 2019-04-14 DIAGNOSIS — E119 Type 2 diabetes mellitus without complications: Secondary | ICD-10-CM | POA: Diagnosis not present

## 2019-04-14 DIAGNOSIS — K2950 Unspecified chronic gastritis without bleeding: Secondary | ICD-10-CM | POA: Diagnosis not present

## 2019-04-14 DIAGNOSIS — K222 Esophageal obstruction: Secondary | ICD-10-CM | POA: Insufficient documentation

## 2019-04-14 DIAGNOSIS — Z881 Allergy status to other antibiotic agents status: Secondary | ICD-10-CM | POA: Insufficient documentation

## 2019-04-14 DIAGNOSIS — K640 First degree hemorrhoids: Secondary | ICD-10-CM | POA: Insufficient documentation

## 2019-04-14 DIAGNOSIS — Z87891 Personal history of nicotine dependence: Secondary | ICD-10-CM | POA: Diagnosis not present

## 2019-04-14 DIAGNOSIS — K5750 Diverticulosis of both small and large intestine without perforation or abscess without bleeding: Secondary | ICD-10-CM | POA: Insufficient documentation

## 2019-04-14 DIAGNOSIS — K228 Other specified diseases of esophagus: Secondary | ICD-10-CM | POA: Diagnosis not present

## 2019-04-14 DIAGNOSIS — E039 Hypothyroidism, unspecified: Secondary | ICD-10-CM | POA: Diagnosis not present

## 2019-04-14 DIAGNOSIS — G43909 Migraine, unspecified, not intractable, without status migrainosus: Secondary | ICD-10-CM | POA: Diagnosis not present

## 2019-04-14 DIAGNOSIS — Z880 Allergy status to penicillin: Secondary | ICD-10-CM | POA: Insufficient documentation

## 2019-04-14 DIAGNOSIS — Z791 Long term (current) use of non-steroidal anti-inflammatories (NSAID): Secondary | ICD-10-CM | POA: Insufficient documentation

## 2019-04-14 DIAGNOSIS — E785 Hyperlipidemia, unspecified: Secondary | ICD-10-CM | POA: Diagnosis not present

## 2019-04-14 DIAGNOSIS — Z888 Allergy status to other drugs, medicaments and biological substances status: Secondary | ICD-10-CM | POA: Insufficient documentation

## 2019-04-14 DIAGNOSIS — Z882 Allergy status to sulfonamides status: Secondary | ICD-10-CM | POA: Diagnosis not present

## 2019-04-14 DIAGNOSIS — G809 Cerebral palsy, unspecified: Secondary | ICD-10-CM | POA: Insufficient documentation

## 2019-04-14 DIAGNOSIS — K219 Gastro-esophageal reflux disease without esophagitis: Secondary | ICD-10-CM | POA: Insufficient documentation

## 2019-04-14 DIAGNOSIS — R569 Unspecified convulsions: Secondary | ICD-10-CM | POA: Insufficient documentation

## 2019-04-14 HISTORY — DX: Hyperlipidemia, unspecified: E78.5

## 2019-04-14 HISTORY — DX: Type 2 diabetes mellitus without complications: E11.9

## 2019-04-14 HISTORY — DX: Diverticulosis of intestine, part unspecified, without perforation or abscess without bleeding: K57.90

## 2019-04-14 HISTORY — PX: ESOPHAGOGASTRODUODENOSCOPY (EGD) WITH PROPOFOL: SHX5813

## 2019-04-14 HISTORY — DX: Gastro-esophageal reflux disease without esophagitis: K21.9

## 2019-04-14 HISTORY — PX: COLONOSCOPY WITH PROPOFOL: SHX5780

## 2019-04-14 SURGERY — COLONOSCOPY WITH PROPOFOL
Anesthesia: General

## 2019-04-14 MED ORDER — LIDOCAINE HCL (PF) 2 % IJ SOLN
INTRAMUSCULAR | Status: AC
  Filled 2019-04-14: qty 10

## 2019-04-14 MED ORDER — SODIUM CHLORIDE 0.9 % IV SOLN: INTRAVENOUS | Status: DC

## 2019-04-14 MED ORDER — PROPOFOL 500 MG/50ML IV EMUL
INTRAVENOUS | Status: AC
  Filled 2019-04-14: qty 50

## 2019-04-14 MED ORDER — PHENYLEPHRINE HCL (PRESSORS) 10 MG/ML IV SOLN
INTRAVENOUS | Status: DC | PRN
  Administered 2019-04-14: 100 ug via INTRAVENOUS

## 2019-04-14 MED ORDER — PROPOFOL 10 MG/ML IV BOLUS
INTRAVENOUS | Status: DC | PRN
  Administered 2019-04-14: 20 mg via INTRAVENOUS
  Administered 2019-04-14: 50 mg via INTRAVENOUS
  Administered 2019-04-14: 30 mg via INTRAVENOUS

## 2019-04-14 MED ORDER — PROPOFOL 500 MG/50ML IV EMUL
INTRAVENOUS | Status: DC | PRN
  Administered 2019-04-14: 150 ug/kg/min via INTRAVENOUS

## 2019-04-14 MED ORDER — LIDOCAINE HCL (CARDIAC) PF 100 MG/5ML IV SOSY
PREFILLED_SYRINGE | INTRAVENOUS | Status: DC | PRN
  Administered 2019-04-14: 50 mg via INTRATRACHEAL

## 2019-04-14 NOTE — Interval H&P Note (Signed)
History and Physical Interval Note:  04/14/2019 10:59 AM  Michele James  has presented today for surgery, with the diagnosis of dyspepsia anemia p hx aden polps.  The various methods of treatment have been discussed with the patient and family. After consideration of risks, benefits and other options for treatment, the patient has consented to  Procedure(s): COLONOSCOPY WITH PROPOFOL (N/A) ESOPHAGOGASTRODUODENOSCOPY (EGD) WITH PROPOFOL (N/A) as a surgical intervention.  The patient's history has been reviewed, patient examined, no change in status, stable for surgery.  I have reviewed the patient's chart and labs.  Questions were answered to the patient's satisfaction.     [place], [place]

## 2019-04-14 NOTE — Anesthesia Postprocedure Evaluation (Signed)
Anesthesia Post Note  Patient: HARUYE LAINEZ  Procedure(s) Performed: COLONOSCOPY WITH PROPOFOL (N/A ) ESOPHAGOGASTRODUODENOSCOPY (EGD) WITH PROPOFOL (N/A )  Patient location during evaluation: Endoscopy Anesthesia Type: General Level of consciousness: awake and alert Pain management: pain level controlled Vital Signs Assessment: post-procedure vital signs reviewed and stable Respiratory status: spontaneous breathing, nonlabored ventilation, respiratory function stable and patient connected to nasal cannula oxygen Cardiovascular status: blood pressure returned to baseline and stable Postop Assessment: no apparent nausea or vomiting Anesthetic complications: no     Last Vitals:  Vitals:   04/14/19 1140 04/14/19 1150  BP: 92/67 92/67  Pulse: 86 99  Resp: 20   Temp: (!) 36.4 C   SpO2: 100% 100%    Last Pain:  Vitals:   04/14/19 1140  TempSrc: Temporal  PainSc:                  Corinda Gubler

## 2019-04-14 NOTE — Transfer of Care (Signed)
Immediate Anesthesia Transfer of Care Note  Patient: Michele James  Procedure(s) Performed: COLONOSCOPY WITH PROPOFOL (N/A ) ESOPHAGOGASTRODUODENOSCOPY (EGD) WITH PROPOFOL (N/A )  Patient Location: PACU  Anesthesia Type:General  Level of Consciousness: awake, alert  and oriented  Airway & Oxygen Therapy: Patient Spontanous Breathing and Patient connected to face mask oxygen  Post-op Assessment: Report given to RN and Post -op Vital signs reviewed and stable  Post vital signs: Reviewed and stable  Last Vitals:  Vitals Value Taken Time  BP 92/67 04/14/19 1150  Temp    Pulse 95 04/14/19 1150  Resp 18 04/14/19 1150  SpO2 100 % 04/14/19 1150  Vitals shown include unvalidated device data.  Last Pain:  Vitals:   04/14/19 1140  TempSrc: Temporal  PainSc:          Complications: No apparent anesthesia complications

## 2019-04-14 NOTE — H&P (Signed)
Outpatient short stay form Pre-procedure 04/14/2019 10:56 AM Michele James K. Michele James, M.D.  Primary Physician: Juluis Pitch, M.D.  Reason for visit:  Presumed iron deficiency anemia, Dyspepsia,  History of present illness:  As above. Patient also with personal hx of colon polyps. No severe UGI or LGI symptoms. No rectal bleeding.    Current Facility-Administered Medications:  .  0.9 %  sodium chloride infusion, , Intravenous, Continuous, [place], Benay Pike, MD, Last Rate: 20 mL/hr at 04/14/19 0957, New Bag at 04/14/19 0957  Medications Prior to Admission  Medication Sig Dispense Refill Last Dose  . Albuterol Sulfate 108 (90 Base) MCG/ACT AEPB Inhale 2 puffs into the lungs every 6 (six) hours as needed.   Past Month at Unknown time  . alendronate (FOSAMAX) 70 MG tablet Take 70 mg by mouth once a week. Take with a full glass of water on an empty stomach.   Past Week at Unknown time  . ALPRAZolam (XANAX) 0.25 MG tablet Take 0.25 mg by mouth as needed for anxiety. Before pap smears   Past Week at Unknown time  . atorvastatin (LIPITOR) 40 MG tablet Take 40 mg by mouth daily.   04/13/2019 at Unknown time  . betamethasone valerate (VALISONE) 0.1 % cream Apply topically 2 (two) times daily as needed.   Past Week at Unknown time  . calcium citrate-vitamin D (CITRACAL+D) 315-200 MG-UNIT per tablet Take 1 tablet by mouth 2 (two) times daily.   Past Week at Unknown time  . carbamazepine (TEGRETOL) 200 MG tablet Take 200 mg by mouth 3 (three) times daily.   04/14/2019 at Unknown time  . Cholecalciferol 100 MCG (4000 UT) TABS Take 1,000 Units by mouth.   Past Week at Unknown time  . Dexlansoprazole 30 MG capsule Take 30 mg by mouth daily.   Past Week at Unknown time  . ergocalciferol (VITAMIN D2) 50000 UNITS capsule Take 50,000 Units by mouth once a week.   Past Week at Unknown time  . esomeprazole (NEXIUM) 40 MG capsule Take 40 mg by mouth daily at 12 noon.     . fexofenadine (ALLEGRA) 180 MG tablet Take 180  mg by mouth daily.   Past Week at Unknown time  . fexofenadine-pseudoephedrine (ALLEGRA-D 24) 180-240 MG 24 hr tablet Take 1 tablet by mouth daily.   Past Week at Unknown time  . hydrocortisone (ANUSOL-HC) 25 MG suppository Place 25 mg rectally 2 (two) times daily as needed for hemorrhoids or anal itching.   Past Week at Unknown time  . levothyroxine (SYNTHROID, LEVOTHROID) 125 MCG tablet Take 125 mcg by mouth daily before breakfast.   04/13/2019 at Unknown time  . magnesium oxide (MAG-OX) 400 MG tablet Take 400 mg by mouth daily.   Past Week at Unknown time  . OLANZapine (ZYPREXA) 5 MG tablet Take 5 mg by mouth at bedtime.   04/13/2019 at Unknown time  . PARoxetine (PAXIL) 20 MG tablet Take 20 mg by mouth daily.   04/13/2019 at Unknown time  . SUMAtriptan (IMITREX) 50 MG tablet Take 50 mg by mouth every 2 (two) hours as needed for migraine. May repeat in 2 hours if headache persists or recurs.   04/13/2019 at Unknown time  . vitamin E 400 UNIT capsule Take 400 Units by mouth daily.   Past Week at Unknown time  . Wheat Dextrin (BENEFIBER PO) Take by mouth.   Past Week at Unknown time  . acetaminophen (TYLENOL) 325 MG tablet Take 325 mg by mouth every 6 (six) hours  as needed.     . metFORMIN (GLUCOPHAGE-XR) 750 MG 24 hr tablet Take 750 mg by mouth 2 (two) times daily at 10 AM and 5 PM.   Not Taking at Unknown time  . naproxen (NAPROSYN) 375 MG tablet Take 375 mg by mouth 2 (two) times daily with a meal.     . potassium chloride 20 MEQ/15ML (10%) SOLN Take 10 mEq by mouth daily.        Allergies  Allergen Reactions  . Advil [Ibuprofen] Itching and Rash    Pt states she can take ibuprofen  . Amoxicillin Rash  . Nsaids Rash  . Penicillins Nausea And Vomiting  . Potassium Nausea And Vomiting and Nausea Only  . Potassium-Containing Compounds Nausea And Vomiting  . Septra [Sulfamethoxazole-Trimethoprim] Rash  . Sulfa Antibiotics Rash  . Tape Rash    Paper tape ok  . Theophyllines Other (See  Comments)    Headache      Past Medical History:  Diagnosis Date  . Arthritis    knees  . Cerebral palsy (HCC)   . Depression   . Diabetes mellitus without complication (HCC)   . Diverticulosis   . Dysphagia   . Eczema   . GERD (gastroesophageal reflux disease)   . Glaucoma   . Hemorrhoids   . Hyperlipidemia   . Hypothyroidism   . Seizures (HCC)    none in over 10 yrs    Review of systems:  Otherwise negative.    Physical Exam  Gen: Alert, oriented. Appears stated age.  HEENT: [place]/AT. PERRLA. Lungs: CTA, no wheezes. CV: RR nl S1, S2. Abd: soft, benign, no masses. BS+ Ext: No edema. Pulses 2+    Planned procedures: Proceed with EGD and colonoscopy. The patient understands the nature of the planned procedure, indications, risks, alternatives and potential complications including but not limited to bleeding, infection, perforation, damage to internal organs and possible oversedation/side effects from anesthesia. The patient agrees and gives consent to proceed.  Please refer to procedure notes for findings, recommendations and patient disposition/instructions.     Michele James Michele James, M.D. Gastroenterology 04/14/2019  10:56 AM

## 2019-04-14 NOTE — Op Note (Signed)
[facility] Gastroenterology Patient Name: Michele James Procedure Date: 04/14/2019 10:52 AM MRN: [id number] Account #: [id number] Date of Birth: 08-02-1955 Admit Type: Outpatient Age: 64 Room: [facility] ENDO ROOM 3 Gender: Female Note Status: Finalized Procedure:             Colonoscopy Indications:           Iron deficiency anemia secondary to chronic blood loss Providers:             Benay Pike. Markie Heffernan MD, MD Medicines:             Propofol per Anesthesia Complications:         No immediate complications. Procedure:             Pre-Anesthesia Assessment:                        - The risks and benefits of the procedure and the                         sedation options and risks were discussed with the                         patient. All questions were answered and informed                         consent was obtained.                        - Patient identification and proposed procedure were                         verified prior to the procedure by the nurse. The                         procedure was verified in the procedure room.                        - ASA Grade Assessment: III - A patient with severe                         systemic disease.                        - After reviewing the risks and benefits, the patient                         was deemed in satisfactory condition to undergo the                         procedure.                        After obtaining informed consent, the colonoscope was                         passed under direct vision. Throughout the procedure,                         the patient's blood pressure, pulse, and oxygen  saturations were monitored continuously. The                         Colonoscope was introduced through the anus and                         advanced to the the cecum, identified by appendiceal                         orifice and ileocecal valve. The patient tolerated the     procedure well. The colonoscopy was somewhat difficult                         due to restricted mobility of the colon. Successful                         completion of the procedure was aided by applying                         abdominal pressure. The patient tolerated the                         procedure well. The quality of the bowel preparation                         was good. Findings:      The perianal and digital rectal examinations were normal. Pertinent       negatives include normal sphincter tone and no palpable rectal lesions.      Many medium-mouthed diverticula were found in the sigmoid colon.      Non-bleeding internal hemorrhoids were found during retroflexion. The       hemorrhoids were Grade I (internal hemorrhoids that do not prolapse).      The exam was otherwise without abnormality. Impression:            - Diverticulosis in the sigmoid colon.                        - Non-bleeding internal hemorrhoids.                        - The examination was otherwise normal.                        - No specimens collected. Recommendation:        - Patient has a contact number available for                         emergencies. The signs and symptoms of potential                         delayed complications were discussed with the patient.                         Return to normal activities tomorrow. Written                         discharge instructions were provided to the patient.                        -  Resume previous diet.                        - Continue present medications.                        - To visualize the small bowel, perform video capsule                         endoscopy at appointment to be scheduled.                        - Return to physician assistant in 6 weeks. Procedure Code(s):     --- Professional ---                        [phone], Colonoscopy, flexible; diagnostic, including                         collection of specimen(s) by brushing or  washing, when                         performed (separate procedure) Diagnosis Code(s):     --- Professional ---                        K57.30, Diverticulosis of large intestine without                         perforation or abscess without bleeding                        D50.0, Iron deficiency anemia secondary to blood loss                         (chronic)                        K64.0, First degree hemorrhoids CPT copyright 2019 American [employer] Association. All rights reserved. The codes documented in this report are preliminary and upon coder review may  be revised to meet current compliance requirements. Stanton Kidney MD, MD 04/14/2019 11:49:12 AM This report has been signed electronically. Number of Addenda: 0 Note Initiated On: 04/14/2019 10:52 AM Scope Withdrawal Time: 0 hours 7 minutes 57 seconds  Total Procedure Duration: 0 hours 16 minutes 19 seconds  Estimated Blood Loss:  Estimated blood loss: none.      [facility]

## 2019-04-14 NOTE — Op Note (Signed)
[facility] Gastroenterology Patient Name: Michele James Procedure Date: 04/14/2019 10:57 AM MRN: [id number] Account #: [id number] Date of Birth: [birth year] Admit Type: Outpatient Age: 64 Room: [facility] ENDO ROOM 3 Gender: Female Note Status: Finalized Procedure:             Upper GI endoscopy Indications:           Iron deficiency anemia secondary to chronic blood                         loss, Dysphagia Providers:             Boykin Nearing. Norma Fredrickson MD, MD Referring MD:          Teena Irani. Terance Hart, MD (Referring MD) Medicines:             Propofol per Anesthesia Complications:         No immediate complications. Estimated blood loss:                         Minimal. Procedure:             Pre-Anesthesia Assessment:                        - The risks and benefits of the procedure and the                         sedation options and risks were discussed with the                         patient. All questions were answered and informed                         consent was obtained.                        - Patient identification and proposed procedure were                         verified prior to the procedure by the nurse. The                         procedure was verified in the procedure room.                        - ASA Grade Assessment: III - A patient with severe                         systemic disease.                        - After reviewing the risks and benefits, the patient                         was deemed in satisfactory condition to undergo the                         procedure.                        After obtaining informed consent, the endoscope was  passed under direct vision. Throughout the procedure,                         the patient's blood pressure, pulse, and oxygen                         saturations were monitored continuously. The Endoscope                         was introduced through the mouth, and advanced to the                      third part of duodenum. The upper GI endoscopy was                         somewhat difficult due to stricture. Successful                         completion of the procedure was aided by performing                         the maneuvers documented (below) in this report. The                         patient tolerated the procedure well. Findings:      Diffuse mild mucosal changes characterized by discoloration and altered       texture were found in the entire esophagus. Biopsies were obtained from       the proximal and distal esophagus with cold forceps for histology of       suspected eosinophilic esophagitis.      One benign-appearing, intrinsic moderate stenosis was found at the       gastroesophageal junction. This stenosis measured 1.2 cm (inner       diameter) x less than one cm (in length). The stenosis was traversed       after dilation. A TTS dilator was passed through the scope. Dilation       with a 12-13.5-15 mm balloon dilator was performed to 15 mm. The       dilation site was examined following endoscope reinsertion and showed       moderate mucosal disruption. Estimated blood loss was minimal.      A 2 cm hiatal hernia was present.      Localized mild inflammation characterized by erosions and erythema was       found in the gastric antrum. Biopsies were taken with a cold forceps for       Helicobacter pylori testing.      A 4 mm non-bleeding diverticulum was found in the first portion of the       duodenum.      The second portion of the duodenum and third portion of the duodenum       were normal.      The exam was otherwise without abnormality. Impression:            - Discolored, texture changed mucosa in the esophagus.                         Biopsied.                        -  Benign-appearing esophageal stenosis. Dilated.                        - 2 cm hiatal hernia.                        - Gastritis. Biopsied.                        -  Non-bleeding duodenal diverticulum.                        - Normal second portion of the duodenum and third                         portion of the duodenum.                        - The examination was otherwise normal. Recommendation:        - Await pathology results.                        - Monitor results to esophageal dilation                        - Proceed with colonoscopy Procedure Code(s):     --- Professional ---                        [phone], Esophagogastroduodenoscopy, flexible,                         transoral; with transendoscopic balloon dilation of                         esophagus (less than 30 mm diameter)                        [id number], 59, Esophagogastroduodenoscopy, flexible,                         transoral; with biopsy, single or multiple Diagnosis Code(s):     --- Professional ---                        K57.10, Diverticulosis of small intestine without                         perforation or abscess without bleeding                        R13.10, Dysphagia, unspecified                        D50.0, Iron deficiency anemia secondary to blood loss                         (chronic)                        K29.70, Gastritis, unspecified, without bleeding                        K44.9, Diaphragmatic hernia without obstruction  or                         gangrene                        K22.2, Esophageal obstruction                        K22.8, Other specified diseases of esophagus CPT copyright 2019 American Medical Association. All rights reserved. The codes documented in this report are preliminary and upon coder review may  be revised to meet current compliance requirements. Stanton Kidney MD, MD 04/14/2019 11:27:19 AM This report has been signed electronically. Number of Addenda: 0 Note Initiated On: 04/14/2019 10:57 AM Estimated Blood Loss:  Estimated blood loss was minimal.      [facility]

## 2019-04-14 NOTE — Anesthesia Preprocedure Evaluation (Addendum)
Anesthesia Evaluation  Patient identified by MRN, date of birth, ID band Patient awake    Reviewed: Allergy & Precautions, H&P , NPO status , Patient's Chart, lab work & pertinent test results  History of Anesthesia Complications Negative for: history of anesthetic complications  Airway Mallampati: III  TM Distance: <3 FB Neck ROM: full    Dental no notable dental hx.    Pulmonary former smoker,    Pulmonary exam normal        Cardiovascular negative cardio ROS Normal cardiovascular exam     Neuro/Psych Seizures - (no seizures in 10 years),     GI/Hepatic GERD  ,  Endo/Other  diabetesHypothyroidism   Renal/GU   negative genitourinary   Musculoskeletal  (+) Arthritis ,   Abdominal   Peds  Hematology   Anesthesia Other Findings Past Medical History: No date: Arthritis     Comment:  knees No date: Cerebral palsy (HCC) No date: Depression No date: Diabetes mellitus without complication (HCC) No date: Diverticulosis No date: Dysphagia No date: Eczema No date: GERD (gastroesophageal reflux disease) No date: Glaucoma No date: Hemorrhoids No date: Hyperlipidemia No date: Hypothyroidism No date: Seizures (HCC)     Comment:  none in over 10 yrs  Reproductive/Obstetrics                            Anesthesia Physical  Anesthesia Plan  ASA: III  Anesthesia Plan: General   Post-op Pain Management:    Induction: Intravenous  PONV Risk Score and Plan: Propofol infusion  Airway Management Planned: Nasal Cannula  Additional Equipment:   Intra-op Plan:   Post-operative Plan:   Informed Consent: I have reviewed the patients History and Physical, chart, labs and discussed the procedure including the risks, benefits and alternatives for the proposed anesthesia with the patient or authorized representative who has indicated his/her understanding and acceptance.     Dental advisory  given  Plan Discussed with: CRNA and Surgeon  Anesthesia Plan Comments:         Anesthesia Quick Evaluation

## 2019-04-15 LAB — SURGICAL PATHOLOGY

## 2019-07-15 ENCOUNTER — Emergency Department
Admission: EM | Admit: 2019-07-15 | Discharge: 2019-07-16 | Disposition: A | Discharge status: Home / Self Care | Payer: Medicare Other | Attending: Emergency Medicine | Admitting: Emergency Medicine

## 2019-07-15 ENCOUNTER — Other Ambulatory Visit: Payer: Self-pay

## 2019-07-15 ENCOUNTER — Encounter: Payer: Self-pay | Admitting: Emergency Medicine

## 2019-07-15 DIAGNOSIS — Z79899 Other long term (current) drug therapy: Secondary | ICD-10-CM | POA: Insufficient documentation

## 2019-07-15 DIAGNOSIS — Z87891 Personal history of nicotine dependence: Secondary | ICD-10-CM | POA: Insufficient documentation

## 2019-07-15 DIAGNOSIS — E039 Hypothyroidism, unspecified: Secondary | ICD-10-CM | POA: Insufficient documentation

## 2019-07-15 DIAGNOSIS — G809 Cerebral palsy, unspecified: Secondary | ICD-10-CM | POA: Insufficient documentation

## 2019-07-15 DIAGNOSIS — Z7984 Long term (current) use of oral hypoglycemic drugs: Secondary | ICD-10-CM | POA: Diagnosis not present

## 2019-07-15 DIAGNOSIS — E119 Type 2 diabetes mellitus without complications: Secondary | ICD-10-CM | POA: Insufficient documentation

## 2019-07-15 DIAGNOSIS — R7989 Other specified abnormal findings of blood chemistry: Secondary | ICD-10-CM | POA: Diagnosis present

## 2019-07-15 DIAGNOSIS — D649 Anemia, unspecified: Secondary | ICD-10-CM

## 2019-07-15 LAB — COMPREHENSIVE METABOLIC PANEL
ALT: 14 U/L (ref 0–44)
AST: 15 U/L (ref 15–41)
Albumin: 3.7 g/dL (ref 3.5–5.0)
Alkaline Phosphatase: 71 U/L (ref 38–126)
Anion gap: 9 (ref 5–15)
BUN: 15 mg/dL (ref 8–23)
CO2: 24 mmol/L (ref 22–32)
Calcium: 9 mg/dL (ref 8.9–10.3)
Chloride: 105 mmol/L (ref 98–111)
Creatinine, Ser: 0.58 mg/dL (ref 0.44–1.00)
GFR calc Af Amer: >60 mL/min (ref >60)
GFR calc non Af Amer: >60 mL/min (ref >60)
Glucose, Bld: 90 mg/dL (ref 70–99)
Potassium: 4.1 mmol/L (ref 3.5–5.1)
Sodium: 138 mmol/L (ref 135–145)
Total Bilirubin: 0.6 mg/dL (ref 0.3–1.2)
Total Protein: 7.1 g/dL (ref 6.5–8.1)

## 2019-07-15 LAB — CBC
HCT: 22.7 % — ABNORMAL LOW (ref 36.0–46.0)
Hemoglobin: 6.7 g/dL — ABNORMAL LOW (ref 12.0–15.0)
MCH: 22.8 pg — ABNORMAL LOW (ref 26.0–34.0)
MCHC: 29.5 g/dL — ABNORMAL LOW (ref 30.0–36.0)
MCV: 77.2 fL — ABNORMAL LOW (ref 80.0–100.0)
Platelets: 421 10*3/uL — ABNORMAL HIGH (ref 150–400)
RBC: 2.94 MIL/uL — ABNORMAL LOW (ref 3.87–5.11)
RDW: 16.8 % — ABNORMAL HIGH (ref 11.5–15.5)
WBC: 7.3 10*3/uL (ref 4.0–10.5)
nRBC: 0 % (ref 0.0–0.2)

## 2019-07-15 LAB — IRON AND TIBC
Iron: 14 ug/dL — ABNORMAL LOW (ref 28–170)
Saturation Ratios: 3 % — ABNORMAL LOW (ref 10.4–31.8)
TIBC: 406 ug/dL (ref 250–450)
UIBC: 392 ug/dL

## 2019-07-15 LAB — RETICULOCYTES
Immature Retic Fract: 21.5 % — ABNORMAL HIGH (ref 2.3–15.9)
RBC.: 2.94 MIL/uL — ABNORMAL LOW (ref 3.87–5.11)
Retic Count, Absolute: 57.3 10*3/uL (ref 19.0–186.0)
Retic Ct Pct: 2 % (ref 0.4–3.1)

## 2019-07-15 LAB — VITAMIN B12: Vitamin B-12: 362 pg/mL (ref 180–914)

## 2019-07-15 LAB — FERRITIN: Ferritin: 5 ng/mL — ABNORMAL LOW (ref 11–307)

## 2019-07-15 LAB — PREPARE RBC (CROSSMATCH)

## 2019-07-15 LAB — ABO/RH: ABO/RH(D): A NEG

## 2019-07-15 LAB — FOLATE: Folate: 16.8 ng/mL (ref >5.9)

## 2019-07-15 MED ORDER — PANTOPRAZOLE SODIUM 40 MG IV SOLR
40.0000 mg | Freq: Once | INTRAVENOUS | Status: AC
  Administered 2019-07-15: 40 mg via INTRAVENOUS
  Filled 2019-07-15: qty 40

## 2019-07-15 MED ORDER — SODIUM CHLORIDE 0.9 % IV SOLN
10.0000 mL/h | Freq: Once | INTRAVENOUS | Status: AC
  Administered 2019-07-15: 10 mL/h via INTRAVENOUS

## 2019-07-15 MED ORDER — FERROUS SULFATE 325 (65 FE) MG PO TABS
325.0000 mg | ORAL_TABLET | Freq: Once | ORAL | Status: AC
  Administered 2019-07-15: 325 mg via ORAL
  Filled 2019-07-15: qty 1

## 2019-07-15 NOTE — ED Notes (Signed)
Pt assisted with bed pan

## 2019-07-15 NOTE — ED Provider Notes (Signed)
[facility] EMERGENCY [facility]PARTMENT Provider Note   CSN: [id number] Arrival date & time: 07/15/19  1313     History Chief Complaint  Patient presents with  . Abnormal Lab    Michele James is a 64 y.o. female hx of cerebral palsy, DM, hypertension, duodenal ulcer here presenting with low hemoglobin.  Patient states that she has been feeling weak and tired for several weeks.  She went to see GI doctor today and had a hemoglobin of 6.2. Of note, her GI doctor, Dr. Norma Fredrickson, perform endoscopy and colonoscopy earlier this year that showed some esophagitis as well as duodenal ulcer .  Patient states that she is not any blood thinners and does not take any NSAIDs.  She is taking Nexium already.  Denies any blood in her stool or black stools.   The history is provided by the patient.       Past Medical History:  Diagnosis Date  . Arthritis    knees  . Cerebral palsy (HCC)   . Depression   . Diabetes mellitus without complication (HCC)   . Diverticulosis   . Dysphagia   . Eczema   . GERD (gastroesophageal reflux disease)   . Glaucoma   . Hemorrhoids   . Hyperlipidemia   . Hypothyroidism   . Seizures (HCC)    none in over 10 yrs    Patient Active Problem List   Diagnosis Date Noted  . Calculus of common duct   . Elevated bilirubin   . Dilated cbd, acquired   . Cholecystitis with cholelithiasis 11/15/2015  . RUQ pain   . Cerebral palsy (HCC) 11/08/2014  . 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 Hypothyroidism (acquired) 05/08/2014  . Depression 10/17/2013  . GERD (gastroesophageal reflux disease) 10/17/2013  . Seizure (HCC) 10/17/2013  . PMB (postmenopausal bleeding) 07/07/2013    Past Surgical History:  Procedure Laterality Date  . CATARACT EXTRACTION W/PHACO Right 02/06/2016   Procedure: CATARACT EXTRACTION PHACO AND INTRAOCULAR LENS PLACEMENT (IOC);  Surgeon: Lockie Mola, MD;  Location: [facility];  Service: Ophthalmology;   Laterality: Right;  . CATARACT EXTRACTION W/PHACO Left 03/05/2016   Procedure: CATARACT EXTRACTION PHACO AND INTRAOCULAR LENS PLACEMENT (IOC);  Surgeon: Lockie Mola, MD;  Location: [facility];  Service: Ophthalmology;  Laterality: Left;  . CHOLECYSTECTOMY N/A 11/16/2015   Procedure: LAPAROSCOPIC CHOLECYSTECTOMY;  Surgeon: Tiney Rouge III, MD;  Location: [facility];  Service: General;  Laterality: N/A;  attempted cholangiogram  . COLONOSCOPY WITH PROPOFOL N/A 12/25/2014   Procedure: COLONOSCOPY WITH PROPOFOL;  Surgeon: Wallace Cullens, MD;  Location: [facility];  Service: Gastroenterology;  Laterality: N/A;  . COLONOSCOPY WITH PROPOFOL N/A 04/14/2019   Procedure: COLONOSCOPY WITH PROPOFOL;  Surgeon: Toledo, Boykin Nearing, MD;  Location: [facility] ENDOSCOPY;  Service: Gastroenterology;  Laterality: N/A;  . ENDOSCOPIC RETROGRADE CHOLANGIOPANCREATOGRAPHY (ERCP) WITH PROPOFOL N/A 11/18/2015   Procedure: ENDOSCOPIC RETROGRADE CHOLANGIOPANCREATOGRAPHY (ERCP) WITH PROPOFOL;  Surgeon: Midge Minium, MD;  Location: [facility] ENDOSCOPY;  Service: Endoscopy;  Laterality: N/A;  . ESOPHAGOGASTRODUODENOSCOPY (EGD) WITH PROPOFOL N/A 04/14/2019   Procedure: ESOPHAGOGASTRODUODENOSCOPY (EGD) WITH PROPOFOL;  Surgeon: Toledo, Boykin Nearing, MD;  Location: [facility] ENDOSCOPY;  Service: Gastroenterology;  Laterality: N/A;  . EYE SURGERY     Cataract Surgery   . HERNIA REPAIR       OB History   No obstetric history on file.     Family History  Problem Relation Age of Onset  . Breast cancer Maternal Aunt   . High blood pressure Mother   .  Hyperlipidemia Mother   . Diabetes Mellitus II Mother   . Stroke Mother   . Osteoporosis Mother   . High blood pressure Father   . Stroke Maternal Grandmother   . Diabetes Mellitus II Maternal Grandmother   . Stroke Maternal Grandfather   . Diabetes Mellitus II Maternal Grandfather     Social History   Tobacco Use  . Smoking status: Former Games developer  . Smokeless tobacco: Never Used  .  Tobacco comment: smoked socially in early 1980s  Substance Use Topics  . Alcohol use: No  . Drug use: No    Home Medications Prior to Admission medications   Medication Sig Start Date End Date Taking? Authorizing Provider  acetaminophen (TYLENOL) 325 MG tablet Take 325 mg by mouth every 6 (six) hours as needed.    [provider]  Albuterol Sulfate 108 (90 Base) MCG/ACT AEPB Inhale 2 puffs into the lungs every 6 (six) hours as needed.    [provider]  alendronate (FOSAMAX) 70 MG tablet Take 70 mg by mouth once a week. Take with a full glass of water on an empty stomach.    [provider]  ALPRAZolam Prudy Feeler) 0.25 MG tablet Take 0.25 mg by mouth as needed for anxiety. Before pap smears    [provider]  atorvastatin (LIPITOR) 40 MG tablet Take 40 mg by mouth daily.    [provider]  betamethasone valerate (VALISONE) 0.1 % cream Apply topically 2 (two) times daily as needed.    [provider]  calcium citrate-vitamin D (CITRACAL+D) 315-200 MG-UNIT per tablet Take 1 tablet by mouth 2 (two) times daily.    [provider]  carbamazepine (TEGRETOL) 200 MG tablet Take 200 mg by mouth 3 (three) times daily.    [provider]  Cholecalciferol 100 MCG (4000 UT) TABS Take 1,000 Units by mouth.    [provider]  Dexlansoprazole 30 MG capsule Take 30 mg by mouth daily.    [provider]  ergocalciferol (VITAMIN D2) 50000 UNITS capsule Take 50,000 Units by mouth once a week.    [provider]  esomeprazole (NEXIUM) 40 MG capsule Take 40 mg by mouth daily at 12 noon.    [provider]  fexofenadine (ALLEGRA) 180 MG tablet Take 180 mg by mouth daily.    [provider]  fexofenadine-pseudoephedrine (ALLEGRA-D 24) 180-240 MG 24 hr tablet Take 1 tablet by mouth daily.    [provider]  hydrocortisone (ANUSOL-HC) 25 MG suppository Place 25 mg rectally 2 (two) times daily  as needed for hemorrhoids or anal itching.    [provider]  levothyroxine (SYNTHROID, LEVOTHROID) 125 MCG tablet Take 125 mcg by mouth daily before breakfast.    [provider]  magnesium oxide (MAG-OX) 400 MG tablet Take 400 mg by mouth daily.    [provider]  metFORMIN (GLUCOPHAGE-XR) 750 MG 24 hr tablet Take 750 mg by mouth 2 (two) times daily at 10 AM and 5 PM.    [provider]  naproxen (NAPROSYN) 375 MG tablet Take 375 mg by mouth 2 (two) times daily with a meal.    [provider]  OLANZapine (ZYPREXA) 5 MG tablet Take 5 mg by mouth at bedtime.    [provider]  PARoxetine (PAXIL) 20 MG tablet Take 20 mg by mouth daily.    [provider]  potassium chloride 20 MEQ/15ML (10%) SOLN Take 10 mEq by mouth daily.    [provider]  SUMAtriptan (IMITREX) 50 MG tablet Take 50 mg by mouth every 2 (two) hours as needed for migraine. May repeat in 2 hours if headache persists or recurs.    [provider]  vitamin E 400 UNIT capsule Take 400 Units by mouth daily.    [provider]  Wheat Dextrin (BENEFIBER PO) Take by mouth.    [provider]    Allergies    Advil [ibuprofen], Amoxicillin, Nsaids, Penicillins, Potassium, Potassium-containing compounds, Septra [sulfamethoxazole-trimethoprim], Sulfa antibiotics, Tape, and Theophyllines  Review of Systems   Review of Systems  Constitutional: Positive for fatigue.  All other systems reviewed and are negative.   Physical Exam Updated Vital Signs BP (!) 137/53   Pulse 77   Temp 98.6 F (37 C)   Resp 18   Ht 5\' 4"  (1.626 m)   Wt 63 kg   SpO2 100%   BMI 23.86 kg/m   Physical Exam Vitals and nursing note reviewed.  Constitutional:      Appearance: Normal appearance.  HENT:     Head: Normocephalic.     Nose: Nose normal.     Mouth/Throat:     Mouth: Mucous membranes are moist.  Eyes:     Extraocular Movements: Extraocular  movements intact.     Pupils: Pupils are equal, round, and reactive to light.  Cardiovascular:     Rate and Rhythm: Normal rate and regular rhythm.     Pulses: Normal pulses.     Heart sounds: Normal heart sounds.  Pulmonary:     Effort: Pulmonary effort is normal.     Breath sounds: Normal breath sounds.  Abdominal:     General: Abdomen is flat.     Palpations: Abdomen is soft.  Genitourinary:    Comments: Rectal- no obvious hemorrhoids, brown stool, guiac negative  Musculoskeletal:        General: Normal range of motion.     Cervical back: Normal range of motion.  Skin:    General: Skin is warm.     Capillary Refill: Capillary refill takes less than 2 seconds.  Neurological:     General: No focal deficit present.     Mental Status: She is alert and oriented to person, place, and time.  Psychiatric:        Mood and Affect: Mood normal.     ED Results / Procedures / Treatments   Labs (all labs ordered are listed, but only abnormal results are displayed) Labs Reviewed  CBC - Abnormal; Notable for the following components:      Result Value   RBC 2.94 (*)    Hemoglobin 6.7 (*)    HCT 22.7 (*)    MCV 77.2 (*)    MCH 22.8 (*)    MCHC 29.5 (*)    RDW 16.8 (*)    Platelets 421 (*)    All other components within normal limits  IRON AND TIBC - Abnormal; Notable for the following components:   Iron 14 (*)    Saturation Ratios 3 (*)    All other components within normal limits  FERRITIN - Abnormal; Notable for the following components:   Ferritin 5 (*)    All other components within normal limits  RETICULOCYTES - Abnormal; Notable for the following components:   RBC. 2.94 (*)    Immature Retic Fract 21.5 (*)    All other components within normal limits  COMPREHENSIVE METABOLIC PANEL  FOLATE  VITAMIN B12  TYPE AND SCREEN  PREPARE RBC (  CROSSMATCH)  ABO/RH    EKG EKG Interpretation  Date/Time:  Friday July 15 2019 13:19:27 EDT Ventricular Rate:  96 PR  Interval:  154 QRS Duration: 74 QT Interval:  336 QTC Calculation: 424 R Axis:   34 Text Interpretation: Normal sinus rhythm Normal ECG When compared with ECG of 16-Mar-1997 16:40, No significant change was found Confirmed by UNCONFIRMED, DOCTOR ([id number]), editor Fredric Mare, Tammy [phone]) on 07/15/2019 3:24:17 PM   Radiology No results found.  Procedures Procedures (including critical care time)  Medications Ordered in ED Medications  pantoprazole (PROTONIX) injection 40 mg (40 mg Intravenous Given 07/15/19 1944)  0.9 %  sodium chloride infusion (0 mL/hr Intravenous Stopped 07/15/19 2214)  ferrous sulfate tablet 325 mg (325 mg Oral Given 07/15/19 2209)    ED Course  I have reviewed the triage vital signs and the nursing notes.  Pertinent labs & imaging results that were available during my care of the patient were reviewed by me and considered in my medical decision making (see chart for details).    MDM Rules/Calculators/A&P                      ZENOVIA JUSTMAN is a 64 y.o. female is here with anemia.  Patient has a history of anemia and baseline hemoglobin is 7.3.  Today it is 6.2.  Guaiac is negative at bedside and denies any black stools.  Patient does have history of duodenal ulcers but is already on Nexium.  I suspected slow GI bleed but also consider other causes of anemia.  Patient does have a history of iron deficiency anemia as well.  We will send off anemia panel and will transfuse 1 unit.  Since patient is not actively bleeding if the hemoglobin here is around 6, will likely transfuse 1 unit and discharge home.  Additional history obtained:  Previous records obtained and reviewed   Lab Tests:  I Ordered, reviewed, and interpreted labs, which included:   CBC, CMP, type and screen,    Medicines ordered:  I ordered medication 1 U PRBC  For anemia  11:05 PM Her hemoglobin 6.7 in the ED.  Her iron level is low.  She is diagnosed with iron deficiency anemia.  Her baseline  hemoglobin is 7.3.  Patient observed for an hour after transfusion.  Patient did not have any transfusion reaction.  Also given ferrous sulfate.  Patient states that her GI doctor called in iron supplement for her as well.  Stable for discharge.    Final Clinical Impression(s) / ED Diagnoses Final diagnoses:  None    Rx / DC Orders ED Discharge Orders    None       Charlynne Pander, MD 07/15/19 2320

## 2019-07-15 NOTE — ED Triage Notes (Signed)
Pt sent to the ED for a low hbg. Pt NP from MD office called and reported a hbg of 6.2. pt reports needs a blood transfusion, feels weak.

## 2019-07-15 NOTE — ED Notes (Addendum)
Blood bank notified that unit is ready at this time.

## 2019-07-15 NOTE — ED Notes (Signed)
Signature pad not working- paper consent at bedside signed.

## 2019-07-15 NOTE — ED Notes (Signed)
Attempted IV/blood draw without success. IV team consulted.

## 2019-07-15 NOTE — Discharge Instructions (Signed)
Take your iron pills as prescribed by your doctor   See your GI doctor for follow up.   Repeat CBC in a week   Return to ER if you have worse weakness, blood in the stool, vomiting

## 2019-07-15 NOTE — ED Notes (Signed)
Pt given pillow and additional blanket, pt repositioned for comfort, lights dimmed for comfort, toileting offered.

## 2019-07-16 LAB — TYPE AND SCREEN
ABO/RH(D): A NEG
Antibody Screen: NEGATIVE
Unit division: 0

## 2019-07-16 LAB — BPAM RBC
Blood Product Expiration Date: 202104302359
ISSUE DATE / TIME: 202104162035
Unit Type and Rh: 600

## 2019-08-06 ENCOUNTER — Ambulatory Visit: Payer: Medicare Other | Attending: Internal Medicine

## 2019-08-06 DIAGNOSIS — Z23 Encounter for immunization: Secondary | ICD-10-CM

## 2019-08-06 NOTE — Progress Notes (Signed)
   Covid-19 Vaccination Clinic  Name:  Michele James    MRN: [id number] DOB: 08/20/1955  08/06/2019  Ms. Michele James was observed post Covid-19 immunization for 15 minutes without incident. She was provided with Vaccine Information Sheet and instruction to access the V-Safe system.   Ms. Michele James was instructed to call 911 with any severe reactions post vaccine: Marland Kitchen Difficulty breathing  . Swelling of face and throat  . A fast heartbeat  . A bad rash all over body  . Dizziness and weakness   Immunizations Administered    Name Date Dose VIS Date Route   Pfizer COVID-19 Vaccine 08/06/2019  9:40 AM 0.3 mL 05/25/2018 Intramuscular   Manufacturer: [employer], [employer]   Lot: [id number]   NDC: [address]-4

## 2019-08-30 ENCOUNTER — Ambulatory Visit: Payer: Medicare Other | Attending: Internal Medicine

## 2019-08-30 DIAGNOSIS — Z23 Encounter for immunization: Secondary | ICD-10-CM

## 2019-08-30 NOTE — Progress Notes (Signed)
   Covid-19 Vaccination Clinic  Name:  Michele James    MRN: [id number] DOB: [birth year]  08/30/2019  [name] was observed post Covid-19 immunization for 15 minutes without incident. She was provided with Vaccine Information Sheet and instruction to access the V-Safe system.   [name] was instructed to call 911 with any severe reactions post vaccine: Marland Kitchen Difficulty breathing  . Swelling of face and throat  . A fast heartbeat  . A bad rash all over body  . Dizziness and weakness   Immunizations Administered    Name Date Dose VIS Date Route   Pfizer COVID-19 Vaccine 08/30/2019  9:38 AM 0.3 mL 05/25/2018 Intramuscular   Manufacturer: [employer], [employer]   Lot: [id number]   NDC: [address]-7

## 2020-01-19 ENCOUNTER — Other Ambulatory Visit: Payer: Self-pay | Admitting: Family Medicine

## 2020-01-19 DIAGNOSIS — Z1231 Encounter for screening mammogram for malignant neoplasm of breast: Secondary | ICD-10-CM

## 2020-03-06 ENCOUNTER — Ambulatory Visit
Admission: RE | Admit: 2020-03-06 | Discharge: 2020-03-06 | Disposition: A | Discharge status: Home / Self Care | Payer: Medicare Other | Source: Ambulatory Visit | Attending: Family Medicine | Admitting: Family Medicine

## 2020-03-06 ENCOUNTER — Other Ambulatory Visit: Payer: Self-pay

## 2020-03-06 DIAGNOSIS — Z1231 Encounter for screening mammogram for malignant neoplasm of breast: Secondary | ICD-10-CM | POA: Insufficient documentation

## 2020-03-13 ENCOUNTER — Other Ambulatory Visit: Payer: Self-pay | Admitting: Family Medicine

## 2020-03-13 DIAGNOSIS — N6310 Unspecified lump in the right breast, unspecified quadrant: Secondary | ICD-10-CM

## 2020-03-13 DIAGNOSIS — R921 Mammographic calcification found on diagnostic imaging of breast: Secondary | ICD-10-CM

## 2020-03-13 DIAGNOSIS — R928 Other abnormal and inconclusive findings on diagnostic imaging of breast: Secondary | ICD-10-CM

## 2020-03-19 ENCOUNTER — Ambulatory Visit
Admission: RE | Admit: 2020-03-19 | Discharge: 2020-03-19 | Disposition: A | Discharge status: Home / Self Care | Payer: Medicare Other | Source: Ambulatory Visit | Attending: Family Medicine | Admitting: Family Medicine

## 2020-03-19 ENCOUNTER — Other Ambulatory Visit: Payer: Self-pay

## 2020-03-19 DIAGNOSIS — N6310 Unspecified lump in the right breast, unspecified quadrant: Secondary | ICD-10-CM | POA: Diagnosis present

## 2020-03-19 DIAGNOSIS — R921 Mammographic calcification found on diagnostic imaging of breast: Secondary | ICD-10-CM | POA: Insufficient documentation

## 2020-03-19 DIAGNOSIS — R928 Other abnormal and inconclusive findings on diagnostic imaging of breast: Secondary | ICD-10-CM | POA: Insufficient documentation

## 2020-03-20 ENCOUNTER — Other Ambulatory Visit (HOSPITAL_COMMUNITY): Payer: Self-pay | Admitting: Gastroenterology

## 2020-03-20 ENCOUNTER — Other Ambulatory Visit: Payer: Self-pay | Admitting: Family Medicine

## 2020-03-20 ENCOUNTER — Other Ambulatory Visit: Payer: Self-pay | Admitting: Gastroenterology

## 2020-03-20 DIAGNOSIS — N6310 Unspecified lump in the right breast, unspecified quadrant: Secondary | ICD-10-CM

## 2020-03-20 DIAGNOSIS — R928 Other abnormal and inconclusive findings on diagnostic imaging of breast: Secondary | ICD-10-CM

## 2020-03-20 DIAGNOSIS — R1033 Periumbilical pain: Secondary | ICD-10-CM

## 2020-03-22 ENCOUNTER — Other Ambulatory Visit: Payer: Self-pay

## 2020-03-22 ENCOUNTER — Ambulatory Visit
Admission: RE | Admit: 2020-03-22 | Discharge: 2020-03-22 | Disposition: A | Discharge status: Home / Self Care | Payer: Medicare Other | Source: Ambulatory Visit | Attending: Family Medicine | Admitting: Family Medicine

## 2020-03-22 DIAGNOSIS — N641 Fat necrosis of breast: Secondary | ICD-10-CM | POA: Diagnosis not present

## 2020-03-22 DIAGNOSIS — R928 Other abnormal and inconclusive findings on diagnostic imaging of breast: Secondary | ICD-10-CM

## 2020-03-22 DIAGNOSIS — N6310 Unspecified lump in the right breast, unspecified quadrant: Secondary | ICD-10-CM | POA: Diagnosis present

## 2020-03-22 DIAGNOSIS — R921 Mammographic calcification found on diagnostic imaging of breast: Secondary | ICD-10-CM | POA: Diagnosis not present

## 2020-03-22 HISTORY — PX: BREAST BIOPSY: SHX20

## 2020-03-26 LAB — SURGICAL PATHOLOGY

## 2020-03-28 ENCOUNTER — Ambulatory Visit
Admission: RE | Admit: 2020-03-28 | Discharge: 2020-03-28 | Disposition: A | Discharge status: Home / Self Care | Payer: Medicare Other | Source: Ambulatory Visit | Attending: Gastroenterology | Admitting: Gastroenterology

## 2020-03-28 ENCOUNTER — Other Ambulatory Visit: Payer: Self-pay

## 2020-03-28 DIAGNOSIS — R1033 Periumbilical pain: Secondary | ICD-10-CM | POA: Insufficient documentation

## 2020-03-28 MED ORDER — IOHEXOL 300 MG/ML  SOLN
100.0000 mL | Freq: Once | INTRAMUSCULAR | Status: AC | PRN
  Administered 2020-03-28: 100 mL via INTRAVENOUS

## 2020-05-10 ENCOUNTER — Other Ambulatory Visit: Payer: Self-pay | Admitting: Gastroenterology

## 2020-05-10 ENCOUNTER — Other Ambulatory Visit (HOSPITAL_COMMUNITY): Payer: Self-pay | Admitting: Gastroenterology

## 2020-05-10 DIAGNOSIS — R911 Solitary pulmonary nodule: Secondary | ICD-10-CM

## 2020-05-16 ENCOUNTER — Emergency Department: Payer: Medicare Other

## 2020-05-16 ENCOUNTER — Other Ambulatory Visit: Payer: Self-pay

## 2020-05-16 ENCOUNTER — Emergency Department
Admission: EM | Admit: 2020-05-16 | Discharge: 2020-05-16 | Disposition: A | Discharge status: Home / Self Care | Payer: Medicare Other | Attending: Emergency Medicine | Admitting: Emergency Medicine

## 2020-05-16 DIAGNOSIS — Z7984 Long term (current) use of oral hypoglycemic drugs: Secondary | ICD-10-CM | POA: Diagnosis not present

## 2020-05-16 DIAGNOSIS — Z79899 Other long term (current) drug therapy: Secondary | ICD-10-CM | POA: Insufficient documentation

## 2020-05-16 DIAGNOSIS — N3000 Acute cystitis without hematuria: Secondary | ICD-10-CM | POA: Insufficient documentation

## 2020-05-16 DIAGNOSIS — E119 Type 2 diabetes mellitus without complications: Secondary | ICD-10-CM | POA: Insufficient documentation

## 2020-05-16 DIAGNOSIS — E039 Hypothyroidism, unspecified: Secondary | ICD-10-CM | POA: Diagnosis not present

## 2020-05-16 DIAGNOSIS — R443 Hallucinations, unspecified: Secondary | ICD-10-CM | POA: Diagnosis present

## 2020-05-16 DIAGNOSIS — Z87891 Personal history of nicotine dependence: Secondary | ICD-10-CM | POA: Diagnosis not present

## 2020-05-16 LAB — CBC
HCT: 39.3 % (ref 36.0–46.0)
Hemoglobin: 13.5 g/dL (ref 12.0–15.0)
MCH: 32.8 pg (ref 26.0–34.0)
MCHC: 34.4 g/dL (ref 30.0–36.0)
MCV: 95.4 fL (ref 80.0–100.0)
Platelets: 297 10*3/uL (ref 150–400)
RBC: 4.12 MIL/uL (ref 3.87–5.11)
RDW: 11.6 % (ref 11.5–15.5)
WBC: 7.2 10*3/uL (ref 4.0–10.5)
nRBC: 0 % (ref 0.0–0.2)

## 2020-05-16 LAB — BASIC METABOLIC PANEL
Anion gap: 11 (ref 5–15)
BUN: 19 mg/dL (ref 8–23)
CO2: 21 mmol/L — ABNORMAL LOW (ref 22–32)
Calcium: 9 mg/dL (ref 8.9–10.3)
Chloride: 104 mmol/L (ref 98–111)
Creatinine, Ser: 0.63 mg/dL (ref 0.44–1.00)
GFR, Estimated: >60 mL/min (ref >60)
Glucose, Bld: 135 mg/dL — ABNORMAL HIGH (ref 70–99)
Potassium: 3.6 mmol/L (ref 3.5–5.1)
Sodium: 136 mmol/L (ref 135–145)

## 2020-05-16 LAB — HEPATIC FUNCTION PANEL
ALT: 21 U/L (ref 0–44)
AST: 25 U/L (ref 15–41)
Albumin: 3.8 g/dL (ref 3.5–5.0)
Alkaline Phosphatase: 72 U/L (ref 38–126)
Bilirubin, Direct: <0.1 mg/dL (ref 0.0–0.2)
Total Bilirubin: 0.4 mg/dL (ref 0.3–1.2)
Total Protein: 7 g/dL (ref 6.5–8.1)

## 2020-05-16 LAB — URINALYSIS, COMPLETE (UACMP) WITH MICROSCOPIC
Bacteria, UA: NONE SEEN
Bilirubin Urine: NEGATIVE
Glucose, UA: NEGATIVE mg/dL
Hgb urine dipstick: NEGATIVE
Ketones, ur: NEGATIVE mg/dL
Nitrite: NEGATIVE
Protein, ur: 30 mg/dL — AB
Specific Gravity, Urine: 1.029 (ref 1.005–1.030)
pH: 5 (ref 5.0–8.0)

## 2020-05-16 LAB — T4, FREE: Free T4: 0.81 ng/dL (ref 0.61–1.12)

## 2020-05-16 LAB — CBG MONITORING, ED: Glucose-Capillary: 124 mg/dL — ABNORMAL HIGH (ref 70–99)

## 2020-05-16 LAB — TSH: TSH: 0.172 u[IU]/mL — ABNORMAL LOW (ref 0.350–4.500)

## 2020-05-16 MED ORDER — ACETAMINOPHEN 500 MG PO TABS
1000.0000 mg | ORAL_TABLET | Freq: Once | ORAL | Status: AC
  Administered 2020-05-16: 1000 mg via ORAL
  Filled 2020-05-16: qty 2

## 2020-05-16 MED ORDER — HYDROMORPHONE HCL 1 MG/ML IJ SOLN
0.5000 mg | Freq: Once | INTRAMUSCULAR | Status: DC
Start: 2020-05-16 — End: 2020-05-16
  Filled 2020-05-16: qty 1

## 2020-05-16 MED ORDER — NITROFURANTOIN MONOHYD MACRO 100 MG PO CAPS: 100.0000 mg | ORAL_CAPSULE | Freq: Two times a day (BID) | ORAL | 0 refills | Status: AC

## 2020-05-16 NOTE — ED Notes (Signed)
E signature signed on paper  

## 2020-05-16 NOTE — ED Triage Notes (Addendum)
Pt states that she is here because "my therapist thinks I'm hallucinating". Pt denies. Visitor that is with patient does not contribute much to history due to lack of of hearing aids and cannot hear RN.  Pt alert but rambling in triage-oriented X 4; but thoughts seem scattered.

## 2020-05-16 NOTE — ED Provider Notes (Signed)
[facility] Emergency Department Provider Note  ____________________________________________   Event Date/Time   First MD Initiated Contact with Patient 05/16/20 1622     (approximate)  I have reviewed the triage vital signs and the nursing notes.   HISTORY  Chief Complaint Hallucinations   HPI Michele James is a 65 y.o. 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  She is somewhat difficult to follow and fairly poor historian but she notes she did not think she is hallucinating but she mentioned she saw some neighbors through her window and her therapist thought this may be hallucinations.  She states her mood is "good".  She denies any SI or HI or any recent drug use or alcohol use.  She does think she may have fallen between her bed and chair couple days ago and has some residual right hip pain from that but denies any other acute complaints.  She does not remember which day she fell and denies being on any blood thinners.  She is accompanied by her mother who seem to help take care of her although her mother is hard of hearing and unable to provide any additional significant history.  She denies any other recent sick symptoms including cough, chest pain, Donnell pain, urinary symptoms, diarrhea, vomiting or any other acute pain.  No other recent falls or injuries that she can recall and she does not think she hit her head.         Past Medical History:  Diagnosis Date  . Arthritis    knees  . Cerebral palsy (HCC)   . Depression   . Diabetes mellitus without complication (HCC)   . Diverticulosis   . Dysphagia   . Eczema   . GERD (gastroesophageal reflux disease)   . Glaucoma   .  Hemorrhoids   . Hyperlipidemia   . Hypothyroidism   . Seizures (HCC)    none in over 10 yrs    Patient Active Problem List   Diagnosis Date Noted  . Calculus of common duct   . Elevated bilirubin   . Dilated cbd, acquired   . Cholecystitis with cholelithiasis 11/15/2015  . RUQ pain   . Cerebral palsy (HCC) 11/08/2014  . 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 Hypothyroidism (acquired) 05/08/2014  . Depression 10/17/2013  . GERD (gastroesophageal reflux disease) 10/17/2013  . Seizure (HCC) 10/17/2013  . PMB (postmenopausal bleeding) 07/07/2013    Past Surgical History:  Procedure Laterality Date  . BREAST BIOPSY Right 03/22/2020   stereo bx, ribbon clip, path pending   . CATARACT EXTRACTION W/PHACO Right 02/06/2016   Procedure: CATARACT EXTRACTION PHACO AND INTRAOCULAR LENS PLACEMENT (IOC);  Surgeon: Lockie Mola, MD;  Location: [facility];  Service: Ophthalmology;  Laterality: Right;  . CATARACT EXTRACTION W/PHACO Left 03/05/2016   Procedure: CATARACT EXTRACTION PHACO AND INTRAOCULAR LENS PLACEMENT (IOC);  Surgeon: Lockie Mola, MD;  Location: [facility];  Service: Ophthalmology;  Laterality: Left;  . CHOLECYSTECTOMY N/A 11/16/2015   Procedure: LAPAROSCOPIC CHOLECYSTECTOMY;  Surgeon: Tiney Rouge III, MD;  Location: [facility];  Service: General;  Laterality: N/A;  attempted cholangiogram  . COLONOSCOPY WITH PROPOFOL N/A 12/25/2014   Procedure: COLONOSCOPY WITH PROPOFOL;  Surgeon: Wallace Cullens, MD;  Location: [facility];  Service: Gastroenterology;  Laterality: N/A;  . COLONOSCOPY WITH PROPOFOL N/A 04/14/2019   Procedure: COLONOSCOPY WITH PROPOFOL;  Surgeon: Toledo, Boykin Nearing, MD;  Location: [facility];  Service: Gastroenterology;  Laterality: N/A;  . ENDOSCOPIC RETROGRADE CHOLANGIOPANCREATOGRAPHY (ERCP) WITH PROPOFOL N/A 11/18/2015   Procedure: ENDOSCOPIC RETROGRADE CHOLANGIOPANCREATOGRAPHY (ERCP) WITH PROPOFOL;  Surgeon: Midge Minium, MD;   Location: [facility];  Service: Endoscopy;  Laterality: N/A;  . ESOPHAGOGASTRODUODENOSCOPY (EGD) WITH PROPOFOL N/A 04/14/2019   Procedure: ESOPHAGOGASTRODUODENOSCOPY (EGD) WITH PROPOFOL;  Surgeon: Toledo, Boykin Nearing, MD;  Location: [facility];  Service: Gastroenterology;  Laterality: N/A;  . EYE SURGERY     Cataract Surgery   . HERNIA REPAIR      Prior to Admission medications   Medication Sig Start Date End Date Taking? Authorizing Provider  nitrofurantoin, macrocrystal-monohydrate, (MACROBID) 100 MG capsule Take 1 capsule (100 mg total) by mouth 2 (two) times daily for 5 days. 05/16/20 05/21/20 Yes Gilles Chiquito, MD  acetaminophen (TYLENOL) 325 MG tablet Take 325 mg by mouth every 6 (six) hours as needed.    [provider]  Albuterol Sulfate 108 (90 Base) MCG/ACT AEPB Inhale 2 puffs into the lungs every 6 (six) hours as needed.    [provider]  alendronate (FOSAMAX) 70 MG tablet Take 70 mg by mouth once a week. Take with a full glass of water on an empty stomach.    [provider]  ALPRAZolam Prudy Feeler) 0.25 MG tablet Take 0.25 mg by mouth as needed for anxiety. Before pap smears    [provider]  atorvastatin (LIPITOR) 40 MG tablet Take 40 mg by mouth daily.    [provider]  betamethasone valerate (VALISONE) 0.1 % cream Apply topically 2 (two) times daily as needed.    [provider]  calcium citrate-vitamin D (CITRACAL+D) 315-200 MG-UNIT per tablet Take 1 tablet by mouth 2 (two) times daily.    [provider]  carbamazepine (TEGRETOL) 200 MG tablet Take 200 mg by mouth 3 (three) times daily.    [provider]  Cholecalciferol 100 MCG (4000 UT) TABS Take 1,000 Units by mouth.    [provider]  Dexlansoprazole 30 MG capsule Take 30 mg by mouth daily.    [provider]  ergocalciferol (VITAMIN D2) 50000 UNITS capsule Take 50,000 Units by mouth once a week.    [provider]   esomeprazole (NEXIUM) 40 MG capsule Take 40 mg by mouth daily at 12 noon.    [provider]  fexofenadine (ALLEGRA) 180 MG tablet Take 180 mg by mouth daily.    [provider]  fexofenadine-pseudoephedrine (ALLEGRA-D 24) 180-240 MG 24 hr tablet Take 1 tablet by mouth daily.    [provider]  hydrocortisone (ANUSOL-HC) 25 MG suppository Place 25 mg rectally 2 (two) times daily as needed for hemorrhoids or anal itching.    [provider]  levothyroxine (SYNTHROID, LEVOTHROID) 125 MCG tablet Take 125 mcg by mouth daily before breakfast.    [provider]  magnesium oxide (MAG-OX) 400 MG tablet Take 400 mg by mouth daily.    [provider]  metFORMIN (GLUCOPHAGE-XR) 750 MG 24 hr tablet Take 750 mg by mouth 2 (two) times daily at 10 AM and 5 PM.    [provider]  naproxen (NAPROSYN) 375 MG tablet Take 375 mg by mouth 2 (two) times daily with a meal.    [provider]  OLANZapine (ZYPREXA) 5 MG tablet Take 5 mg by mouth  at bedtime.    [provider]  PARoxetine (PAXIL) 20 MG tablet Take 20 mg by mouth daily.    [provider]  potassium chloride 20 MEQ/15ML (10%) SOLN Take 10 mEq by mouth daily.    [provider]  SUMAtriptan (IMITREX) 50 MG tablet Take 50 mg by mouth every 2 (two) hours as needed for migraine. May repeat in 2 hours if headache persists or recurs.    [provider]  vitamin E 400 UNIT capsule Take 400 Units by mouth daily.    [provider]  Wheat Dextrin (BENEFIBER PO) Take by mouth.    [provider]    Allergies Advil [ibuprofen], Amoxicillin, Nsaids, Penicillins, Potassium, Potassium-containing compounds, Septra [sulfamethoxazole-trimethoprim], Sulfa antibiotics, Tape, and Theophyllines  Family History  Problem Relation Age of Onset  . Breast cancer Maternal Aunt   . High blood pressure Mother   . Hyperlipidemia Mother   . Diabetes  Mellitus II Mother   . Stroke Mother   . Osteoporosis Mother   . High blood pressure Father   . Stroke Maternal Grandmother   . Diabetes Mellitus II Maternal Grandmother   . Stroke Maternal Grandfather   . Diabetes Mellitus II Maternal Grandfather     Social History Social History   Tobacco Use  . Smoking status: Former Games developer  . Smokeless tobacco: Never Used  . Tobacco comment: smoked socially in early 55s  Vaping Use  . Vaping Use: Never used  Substance Use Topics  . Alcohol use: No  . Drug use: No    Review of Systems  Review of Systems  Constitutional: Negative for chills and fever.  HENT: Negative for sore throat.   Eyes: Negative for pain.  Respiratory: Negative for cough and stridor.   Cardiovascular: Negative for chest pain.  Gastrointestinal: Negative for vomiting.  Genitourinary: Negative for dysuria.  Musculoskeletal: Positive for joint pain ( R hip) and myalgias ( R hip).  Skin: Negative for rash.  Neurological: Negative for seizures, loss of consciousness and headaches.  Psychiatric/Behavioral: Negative for suicidal ideas.  All other systems reviewed and are negative.     ____________________________________________   PHYSICAL EXAM:  VITAL SIGNS: ED Triage Vitals [05/16/20 1341]  Enc Vitals Group     BP (!) 148/65     Pulse Rate (!) 110     Resp (!) 26     Temp 98.6 F (37 C)     Temp Source Oral     SpO2 97 %     Weight 138 lb 14.2 oz (63 kg)     Height 5\' 4"  (1.626 m)     Head Circumference      Peak Flow      Pain Score 0     Pain Loc      Pain Edu?      Excl. in GC?    Vitals:   05/16/20 1341 05/16/20 1833  BP: (!) 148/65 137/69  Pulse: (!) 110 89  Resp: (!) 26 18  Temp: 98.6 F (37 C)   SpO2: 97% 99%   Physical Exam Vitals and nursing note reviewed.  Constitutional:      Appearance: Normal appearance. She is normal weight.  HENT:     Head: Normocephalic and atraumatic.     Right Ear: External ear normal.     Left  Ear: External ear normal.     Nose: Nose normal.     Mouth/Throat:     Mouth: Mucous membranes are moist.  Eyes:     Extraocular Movements: Extraocular movements intact.     Conjunctiva/sclera: Conjunctivae normal.     Pupils: Pupils are equal, round, and reactive to light.  Cardiovascular:     Rate and Rhythm: Normal rate and regular rhythm.     Pulses: Normal pulses.  Pulmonary:     Effort: Pulmonary effort is normal. No respiratory distress.     Breath sounds: Normal breath sounds. No wheezing or rales.  Abdominal:     General: There is no distension.     Tenderness: There is no abdominal tenderness.  Musculoskeletal:     Right lower leg: No edema.     Left lower leg: No edema.  Skin:    General: Skin is warm.     Capillary Refill: Capillary refill takes less than 2 seconds.  Neurological:     Mental Status: She is alert and oriented to person, place, and time.  Psychiatric:        Mood and Affect: Mood normal.     Cranial nerves II through XII grossly intact.  Sensation is intact light touch all extremities.  Patient has full strength in her right upper extremity and decreased drink in her left upper extremity which she says is baseline.  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  Mild tenderness palpation over the right lateral hip.  No significant overlying skin changes..  Patient is oriented and does not appear acutely psychotic or to be responding to internal stimuli at this time.   ____________________________________________   LABS (all labs ordered are listed, but only abnormal results are displayed)  Labs Reviewed  BASIC METABOLIC PANEL - Abnormal; Notable for the following components:      Result Value   CO2 21 (*)    Glucose, Bld 135 (*)    All other components within normal limits  URINALYSIS, COMPLETE (UACMP)  WITH MICROSCOPIC - Abnormal; Notable for the following components:   Color, Urine YELLOW (*)    APPearance CLOUDY (*)    Protein, ur 30 (*)    Leukocytes,Ua MODERATE (*)    All other components within normal limits  TSH - Abnormal; Notable for the following components:   TSH 0.172 (*)    All other components within normal limits  CBG MONITORING, ED - Abnormal; Notable for the following components:   Glucose-Capillary 124 (*)    All other components within normal limits  URINE CULTURE  CBC  HEPATIC FUNCTION PANEL  T4, FREE   ____________________________________________  EKG  ____________________________________________  RADIOLOGY  ED MD interpretation: CT head and C-spine showed no evidence of CVA, intracranial hemorrhage, or acute injury.  There is evidence of likely chronic C-spine segmental abnormalities.  Chest x-ray shows no fracture, for consolidation, effusion, edema or other clear acute thoracic process.  Right hip x-ray shows no fracture dislocation but no significant arthritis.  Official radiology report(s): DG Chest 2 View  Result Date: 05/16/2020 CLINICAL DATA:  Fall EXAM: CHEST - 2 VIEW COMPARISON:  None. FINDINGS: Top-normal heart size. Atherosclerotic aortic arch. Otherwise normal mediastinal contour. No pneumothorax. No pleural effusion. No pulmonary edema. Slightly low lung volumes with mild bibasilar atelectasis. No displaced fractures. IMPRESSION: Slightly low lung volumes with mild bibasilar atelectasis. Electronically Signed   By: Delbert PhenixJason A Poff M.D.   On: 05/16/2020 17:21   CT Head Wo Contrast  Result  Date: 05/16/2020 CLINICAL DATA:  Mental status change. Scattered thoughts. No reported injury. EXAM: CT HEAD WITHOUT CONTRAST TECHNIQUE: Contiguous axial images were obtained from the base of the skull through the vertex without intravenous contrast. COMPARISON:  None. FINDINGS: Brain: No evidence of parenchymal hemorrhage or extra-axial fluid collection. No mass  lesion, mass effect, or midline shift. No CT evidence of acute infarction. Nonspecific mild subcortical and periventricular white matter hypodensity, most in keeping with chronic small vessel ischemic change. Cerebral volume is age appropriate. No ventriculomegaly. Vascular: No acute abnormality. Skull: No evidence of calvarial fracture. Sinuses/Orbits: The visualized paranasal sinuses are essentially clear. Other:  The mastoid air cells are unopacified. IMPRESSION: 1. No evidence of acute intracranial abnormality. 2. Mild chronic small vessel ischemic changes in the cerebral white matter. Electronically Signed   By: Delbert Phenix M.D.   On: 05/16/2020 17:28   CT Cervical Spine Wo Contrast  Result Date: 05/16/2020 CLINICAL DATA:  Neck trauma.  Mental status change. EXAM: CT CERVICAL SPINE WITHOUT CONTRAST TECHNIQUE: Multidetector CT imaging of the cervical spine was performed without intravenous contrast. Multiplanar CT image reconstructions were also generated. COMPARISON:  None. FINDINGS: Alignment: Normal cervical lordosis. There are extensive multilevel segmentation anomalies throughout the C1 through C6 cervical spine, presumably congenital and chronic appearing. Nonunion of anterior C1 arch. Abnormal articulations at the junctions of the anterior and posterior elements at C2 and C5 bilaterally. Absent right C4 pedicle. Dens is well positioned between the lateral masses of C1. No convincing evidence of acute facet subluxation, noting unusual configuration of facets throughout the cervical spine related to segmentation anomalies. There is 2 mm retrolisthesis at C3-4, 2 mm anterolisthesis at C5-6 and 5 mm anterolisthesis at C6-7. Skull base and vertebrae: No acute fracture. No primary bone lesion or focal pathologic process. Soft tissues and spinal canal: No prevertebral edema. No visible canal hematoma. Disc levels: Moderate multilevel cervical degenerative disc disease, most prominent at C2-3. Mild bilateral  facet arthropathy. Upper chest: No acute abnormality. Other: Visualized mastoid air cells appear clear. No discrete thyroid nodules. No pathologically enlarged cervical nodes. IMPRESSION: 1. 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 2. No cervical spine fracture. No convincing evidence of acute facet subluxation, noting unusual probably chronic configuration of facets throughout the cervical spine related to segmentation anomalies. 3. Moderate multilevel cervical degenerative disc disease and mild bilateral facet arthropathy. 4. Multilevel cervical spondylolisthesis. Electronically Signed   By: Delbert Phenix M.D.   On: 05/16/2020 17:38   DG Hip Unilat W or Wo Pelvis 2-3 Views Right  Result Date: 05/16/2020 CLINICAL DATA:  Fall with right hip and low back pain EXAM: DG HIP (WITH OR WITHOUT PELVIS) 2-3V RIGHT COMPARISON:  None. FINDINGS: No pelvic fracture or diastasis. No right hip fracture or dislocation. Mild to moderate osteoarthritis in the right hip with anterior marginal osteophyte. Mild degenerative changes in the visualized lower lumbar spine. No suspicious focal osseous lesions. No radiopaque foreign bodies. IMPRESSION: No right hip fracture or dislocation. 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 Electronically Signed   By: Delbert Phenix M.D.   On: 05/16/2020 17:20    ____________________________________________   PROCEDURES  Procedure(s) performed (including Critical Care):  .1-3 Lead EKG Interpretation Performed by: Gilles Chiquito, MD Authorized by: Gilles Chiquito, MD     Interpretation: normal     ECG rate assessment: normal     Rhythm: sinus rhythm     Ectopy: none  Conduction: normal       ____________________________________________   INITIAL IMPRESSION / ASSESSMENT AND PLAN / ED COURSE      Patient presents with above history exam for her seemingly 2 separate concerns.   First she states that she was told to come to ED by her PCP who was worried she might be hallucinating and specifically seeing some neighbors her PCP did not think actually there.  Patient denies any acute mood symptoms and does not appear psychotic homicidal suicidal to be actively hallucinating on arrival.  She has a nonfocal neuro exam and below suspicion for toxic ingestion or significant metabolic derangement at this time.  She seems to be at her neurological baseline without clear no focal deficits aside from slightly decreased drink at the right hip from recent fall although neither patient nor her mother seem to care for her ankles exactly when she fell.  No other obvious trauma on exam.  Plain films of the patient's right hip and chest show no obvious injury other some arthritis in the right hip.  CT head and C-spine showed no acute injury or other clear acute process.  BMP shows no significant electrode or metabolic derangements.  CBC shows no leukocytosis or acute anemia.  Hepatic function panel is unremarkable.  TSH is slightly low although T4 is WNL.  UA is remarkable for evidence of possible cystitis.  Urine culture sent.  Will write Rx for Macrobid.  Do not believe patient requires hospitalization at this time as she does not currently appear delirious psychotic rate of actively hallucinating or have any other clear immediate life-threatening pathology this time.  Discharged stable condition.  Strict term cautions advised and discussed.    ____________________________________________   FINAL CLINICAL IMPRESSION(S) / ED DIAGNOSES  Final diagnoses:  Acute cystitis without hematuria    Medications  acetaminophen (TYLENOL) tablet 1,000 mg (1,000 mg Oral Given 05/16/20 1832)     ED Discharge Orders         Ordered    nitrofurantoin, macrocrystal-monohydrate, (MACROBID) 100 MG capsule  2 times daily        05/16/20 1854           Note:  This document was prepared using Dragon  voice recognition software and may include unintentional dictation errors.   Gilles Chiquito, MD 05/16/20 [phone]

## 2020-05-18 LAB — URINE CULTURE

## 2020-06-14 ENCOUNTER — Ambulatory Visit
Admission: RE | Admit: 2020-06-14 | Discharge: 2020-06-14 | Disposition: A | Discharge status: Home / Self Care | Payer: Medicare Other | Source: Ambulatory Visit | Attending: Gastroenterology | Admitting: Gastroenterology

## 2020-06-14 ENCOUNTER — Other Ambulatory Visit: Payer: Self-pay

## 2020-06-14 DIAGNOSIS — R911 Solitary pulmonary nodule: Secondary | ICD-10-CM | POA: Insufficient documentation

## 2020-06-14 LAB — POCT I-STAT CREATININE: Creatinine, Ser: 0.5 mg/dL (ref 0.44–1.00)

## 2020-06-14 MED ORDER — IOHEXOL 300 MG/ML  SOLN
75.0000 mL | Freq: Once | INTRAMUSCULAR | Status: AC | PRN
  Administered 2020-06-14: 75 mL via INTRAVENOUS

## 2020-06-20 ENCOUNTER — Encounter: Payer: Self-pay | Admitting: *Deleted

## 2020-06-20 ENCOUNTER — Inpatient Hospital Stay: Payer: Medicare Other

## 2020-06-20 ENCOUNTER — Inpatient Hospital Stay: Payer: Medicare Other | Attending: Oncology | Admitting: Oncology

## 2020-06-20 ENCOUNTER — Encounter: Payer: Self-pay | Admitting: Oncology

## 2020-06-20 ENCOUNTER — Encounter (INDEPENDENT_AMBULATORY_CARE_PROVIDER_SITE_OTHER): Payer: Self-pay

## 2020-06-20 VITALS — BP 158/77 | HR 98 | Temp 99.7°F | Resp 16

## 2020-06-20 DIAGNOSIS — G809 Cerebral palsy, unspecified: Secondary | ICD-10-CM

## 2020-06-20 DIAGNOSIS — R911 Solitary pulmonary nodule: Secondary | ICD-10-CM

## 2020-06-20 DIAGNOSIS — Z87891 Personal history of nicotine dependence: Secondary | ICD-10-CM | POA: Diagnosis not present

## 2020-06-20 NOTE — Progress Notes (Signed)
Hematology/Oncology Consult note [facility] Telephone:([phone] [phone]   Patient Care Team: Dorothey Baseman, MD as PCP - General (Family Medicine) Glory Buff, RN as Oncology Nurse Navigator  REFERRING PROVIDER: Freda Munro*  CHIEF COMPLAINTS/REASON FOR VISIT:  Evaluation of lung nodule  HISTORY OF PRESENTING ILLNESS:   Michele James is a  65 y.o.  female with PMH listed below was seen in consultation at the request of  Freda Munro*  for evaluation of lung nodule Patient has a history of seizure disorder/history of cerebral palsy Patient is a poor historian.  She was accompanied by her mother who has hearing deficiency and not able to provide much history.Marland Kitchen   History was mainly obtained from reviewing medical records.  06/14/2020, CT chest with contrast showed a lobulated nodule with mildly spiculated margin in the posterior right chest persist and is suspicious for bronchogenic neoplasm based on morphologic repeat years Nodule along the minor fissure in the right chest measures 6 mm, nonspecific.  Moderately large hiatal hernia 11 mm distal common bile duct, previously 8 to 9 mm.  No pancreatic duct dilatation is visualized.  Prominence of the common bile duct on the prior study in this patient's post cholecystectomy.  Aortic atherosclerosis.  Patient was referred to establish care with oncology for further evaluation of the lung nodule.  She denies any cough, shortness of breath, hemoptysis, patient is a former smoker, and quit in 1982.  She reports only smoked for 4 months and mostly on weekends.  Patient has contractures and physical deformities which severely limits her  mobility.  She lives at home with parents.  Review of Systems  Constitutional: Positive for appetite change. Negative for fatigue and unexpected weight change.  Respiratory: Negative for cough and shortness of breath.   Cardiovascular: Negative  for chest pain.  Gastrointestinal: Negative for abdominal pain.  Musculoskeletal: Negative for back pain.  Neurological: Positive for seizures.  Hematological: Does not bruise/bleed easily.  Psychiatric/Behavioral: Negative for confusion.    MEDICAL HISTORY:  Past Medical History:  Diagnosis Date  . Anemia   . Arthritis    knees  . Cerebral palsy (HCC)   . Depression   . Diabetes mellitus without complication (HCC)   . Diverticulosis   . Dysphagia   . Eczema   . GERD (gastroesophageal reflux disease)   . Glaucoma   . Hemorrhoids   . Hyperlipidemia   . Hypothyroidism   . Seizures (HCC)    none in over 10 yrs    SURGICAL HISTORY: Past Surgical History:  Procedure Laterality Date  . BREAST BIOPSY Right 03/22/2020   stereo bx, ribbon clip, path pending   . CATARACT EXTRACTION W/PHACO Right 02/06/2016   Procedure: CATARACT EXTRACTION PHACO AND INTRAOCULAR LENS PLACEMENT (IOC);  Surgeon: Lockie Mola, MD;  Location: [facility];  Service: Ophthalmology;  Laterality: Right;  . CATARACT EXTRACTION W/PHACO Left 03/05/2016   Procedure: CATARACT EXTRACTION PHACO AND INTRAOCULAR LENS PLACEMENT (IOC);  Surgeon: Lockie Mola, MD;  Location: [facility];  Service: Ophthalmology;  Laterality: Left;  . CHOLECYSTECTOMY N/A 11/16/2015   Procedure: LAPAROSCOPIC CHOLECYSTECTOMY;  Surgeon: Tiney Rouge III, MD;  Location: [facility] OR[facility];  Service: General;  Laterality: N/A;  attempted cholangiogram  . COLONOSCOPY WITH PROPOFOL N/A 12/25/2014   Procedure: COLONOSCOPY WITH PROPOFOL;  Surgeon: Wallace Cullens, MD;  Location: [facility] END[facility];  Service: Gastroenterology;  Laterality: N/A;  . COLONOSCOPY WITH PROPOFOL N/A 04/14/2019   Procedure: COLONOSCOPY WITH PROPOFOL;  Surgeon:  Toledo, Boykin Nearingeodoro K, MD;  Location: [facility] ENDOSCOPY;  Service: Gastroenterology;  Laterality: N/A;  . ENDOSCOPIC RETROGRADE CHOLANGIOPANCREATOGRAPHY (ERCP) WITH PROPOFOL N/A 11/18/2015   Procedure: ENDOSCOPIC  RETROGRADE CHOLANGIOPANCREATOGRAPHY (ERCP) WITH PROPOFOL;  Surgeon: Midge Miniumarren Wohl, MD;  Location: [facility] ENDOSCOPY;  Service: Endoscopy;  Laterality: N/A;  . ESOPHAGOGASTRODUODENOSCOPY (EGD) WITH PROPOFOL N/A 04/14/2019   Procedure: ESOPHAGOGASTRODUODENOSCOPY (EGD) WITH PROPOFOL;  Surgeon: Toledo, Boykin Nearingeodoro K, MD;  Location: [facility] ENDOSCOPY;  Service: Gastroenterology;  Laterality: N/A;  . EYE SURGERY     Cataract Surgery   . HERNIA REPAIR      SOCIAL HISTORY: Social History   Socioeconomic History  . Marital status: Single    Spouse name: Not on file  . Number of children: Not on file  . Years of education: Not on file  . Highest education level: Not on file  Occupational History  . Not on file  Tobacco Use  . Smoking status: Former Smoker    Quit date: 06/20/1980    Years since quitting: 40.0  . Smokeless tobacco: Never Used  . Tobacco comment: smoked socially in early 1980s (reports for 4 months)  Vaping Use  . Vaping Use: Never used  Substance and Sexual Activity  . Alcohol use: No  . Drug use: No  . Sexual activity: Not on file  Other Topics Concern  . Not on file  Social History Narrative  . Not on file   Social Determinants of Health   Financial Resource Strain: Not on file  Food Insecurity: Not on file  Transportation Needs: Not on file  Physical Activity: Not on file  Stress: Not on file  Social Connections: Not on file  Intimate Partner Violence: Not on file    FAMILY HISTORY: Family History  Problem Relation Age of Onset  . Breast cancer Maternal Aunt   . High blood pressure Mother   . Hyperlipidemia Mother   . Diabetes Mellitus II Mother   . Stroke Mother   . Osteoporosis Mother   . High blood pressure Father   . Stroke Maternal Grandmother   . Diabetes Mellitus II Maternal Grandmother   . Stroke Maternal Grandfather   . Diabetes Mellitus II Maternal Grandfather     ALLERGIES:  is allergic to aminophylline, cinnamon, advil [ibuprofen], amoxicillin,  latex, nsaids, penicillins, potassium, potassium-containing compounds, septra [sulfamethoxazole-trimethoprim], sulfa antibiotics, sulfasalazine, tape, and theophyllines.  MEDICATIONS:  Current Outpatient Medications  Medication Sig Dispense Refill  . acetaminophen (TYLENOL) 325 MG tablet Take 325 mg by mouth every 6 (six) hours as needed.    . Albuterol Sulfate 108 (90 Base) MCG/ACT AEPB Inhale 2 puffs into the lungs every 6 (six) hours as needed.    Marland Kitchen. alendronate (FOSAMAX) 70 MG tablet Take 70 mg by mouth once a week. Take with a full glass of water on an empty stomach.    . ALPRAZolam (XANAX) 0.25 MG tablet Take 0.25 mg by mouth as needed for anxiety. Before pap smears    . atorvastatin (LIPITOR) 40 MG tablet Take 40 mg by mouth daily.    . betamethasone valerate (VALISONE) 0.1 % cream Apply topically 2 (two) times daily as needed.    . calcium citrate-vitamin D (CITRACAL+D) 315-200 MG-UNIT per tablet Take 1 tablet by mouth 2 (two) times daily.    . carbamazepine (TEGRETOL) 200 MG tablet Take 200 mg by mouth 3 (three) times daily.    . Cholecalciferol 100 MCG (4000 UT) TABS Take 1,000 Units by mouth.    . Dexlansoprazole 30 MG  capsule Take 30 mg by mouth daily.    . ergocalciferol (VITAMIN D2) 50000 UNITS capsule Take 50,000 Units by mouth once a week.    . esomeprazole (NEXIUM) 40 MG capsule Take 40 mg by mouth daily at 12 noon.    . famotidine (PEPCID) 20 MG tablet Take 1 tablet by mouth 2 (two) times daily.    . fexofenadine (ALLEGRA) 180 MG tablet Take 180 mg by mouth daily.    . fexofenadine-pseudoephedrine (ALLEGRA-D 24) 180-240 MG 24 hr tablet Take 1 tablet by mouth daily.    . hydrocortisone (ANUSOL-HC) 25 MG suppository Place 25 mg rectally 2 (two) times daily as needed for hemorrhoids or anal itching.    . levothyroxine (SYNTHROID, LEVOTHROID) 125 MCG tablet Take 125 mcg by mouth daily before breakfast.    . magnesium oxide (MAG-OX) 400 MG tablet Take 400 mg by mouth daily.    .  metFORMIN (GLUCOPHAGE-XR) 750 MG 24 hr tablet Take 750 mg by mouth 2 (two) times daily at 10 AM and 5 PM.    . OLANZapine (ZYPREXA) 5 MG tablet Take 5 mg by mouth at bedtime.    Marland Kitchen PARoxetine (PAXIL) 20 MG tablet Take 20 mg by mouth daily.    . potassium chloride 20 MEQ/15ML (10%) SOLN Take 10 mEq by mouth daily.    . SUMAtriptan (IMITREX) 50 MG tablet Take 50 mg by mouth every 2 (two) hours as needed for migraine. May repeat in 2 hours if headache persists or recurs.    . vitamin E 400 UNIT capsule Take 400 Units by mouth daily.    . Wheat Dextrin (BENEFIBER PO) Take by mouth.    . naproxen (NAPROSYN) 375 MG tablet Take 375 mg by mouth 2 (two) times daily with a meal. (Patient not taking: Reported on 06/20/2020)     No current facility-administered medications for this visit.     PHYSICAL EXAMINATION: ECOG PERFORMANCE STATUS: 2 - Symptomatic, <50% confined to bed Vitals:   06/20/20 1131  BP: (!) 158/77  Pulse: 98  Resp: 16  Temp: 99.7 F (37.6 C)   There were no vitals filed for this visit.  Physical Exam Constitutional:      General: She is not in acute distress.    Comments: Patient sits in the wheelchair  HENT:     Head: Normocephalic and atraumatic.  Eyes:     General: No scleral icterus. Cardiovascular:     Rate and Rhythm: Normal rate and regular rhythm.     Heart sounds: Normal heart sounds.  Pulmonary:     Effort: Pulmonary effort is normal. No respiratory distress.     Breath sounds: No wheezing.  Abdominal:     General: Bowel sounds are normal. There is no distension.     Palpations: Abdomen is soft.  Musculoskeletal:        General: Deformity present.     Cervical back: Normal range of motion and neck supple.     Comments: Contracture of left upper extremity in the left lower extremity.  Skin:    General: Skin is warm and dry.     Findings: No erythema or rash.  Neurological:     Mental Status: She is alert. Mental status is at baseline.  Psychiatric:         Mood and Affect: Mood normal.     LABORATORY DATA:  I have reviewed the data as listed Lab Results  Component Value Date   WBC 7.2 05/16/2020   HGB  13.5 05/16/2020   HCT 39.3 05/16/2020   MCV 95.4 05/16/2020   PLT 297 05/16/2020   Recent Labs    07/15/19 1719 05/16/20 1334 06/14/20 1013  NA 138 136  --   K 4.1 3.6  --   CL 105 104  --   CO2 24 21*  --   GLUCOSE 90 135*  --   BUN 15 19  --   CREATININE 0.58 0.63 0.50  CALCIUM 9.0 9.0  --   GFRNONAA >60 >60  --   GFRAA >60  --   --   PROT 7.1 7.0  --   ALBUMIN 3.7 3.8  --   AST 15 25  --   ALT 14 21  --   ALKPHOS 71 72  --   BILITOT 0.6 0.4  --   BILIDIR  --  <0.1  --   IBILI  --  NOT CALCULATED  --    Iron/TIBC/Ferritin/ %Sat    Component Value Date/Time   IRON 14 (L) 07/15/2019 1719   TIBC 406 07/15/2019 1719   FERRITIN 5 (L) 07/15/2019 1719   IRONPCTSAT 3 (L) 07/15/2019 1719      RADIOGRAPHIC STUDIES: I have personally reviewed the radiological images as listed and agreed with the findings in the report. CT CHEST W CONTRAST  Result Date: 06/14/2020 CLINICAL DATA:  Follow-up CT scan for lung nodule in a 65 year old female. EXAM: CT CHEST WITH CONTRAST TECHNIQUE: Multidetector CT imaging of the chest was performed during intravenous contrast administration. CONTRAST:  69mL OMNIPAQUE IOHEXOL 300 MG/ML  SOLN COMPARISON:  March 28, 2020 FINDINGS: Cardiovascular: Calcified and noncalcified atheromatous plaque in the thoracic aorta. No aneurysmal dilation. Heart size is normal without pericardial effusion. Central pulmonary vasculature is unremarkable on venous phase assessment. Mediastinum/Nodes: Thoracic inlet structures are unremarkable. No axillary lymphadenopathy. No mediastinal lymphadenopathy. No hilar lymphadenopathy. Esophagus patulous with signs of hiatal hernia. Lungs/Pleura: Nodule along the minor fissure in the RIGHT chest measures 6 mm (image 68, series 3) Lobulated nodule along the posterior RIGHT  lower lobe on image 97 of series 3 measures 1.4 x 0.9 cm. This is not changed compared to December of 2021. Mild subpleural reticulation. Airways are patent. Upper Abdomen: Incidental imaging of upper abdominal contents with a levin mm distal common bile duct. No pancreatic ductal dilation is visualized. There was prominence of the common bile duct on the prior study in this patient post cholecystectomy. Imaged portions of liver, spleen and adrenal glands as well as the kidneys without acute process. Moderately large hiatal hernia proximally 50% of the stomach herniated into the chest. Musculoskeletal: No acute musculoskeletal process. Spinal degenerative changes. IMPRESSION: 1. Lobulated nodule with mildly spiculated margins in the posterior RIGHT chest persists and is suspicious for bronchogenic neoplasm based on morphologic features. Referral to multi disciplinary oncologic setting is suggested with PET scan as warranted. 2. Nodule along the minor fissure in the RIGHT chest measures 6 mm. Nonspecific finding, suggest attention on follow-up and on PET if performed. 3. Moderately large hiatal hernia proximally 50% of the stomach herniated into the chest. 4. Incidental imaging of upper abdominal contents with 11 mm distal common bile duct. Previously 8-9 mm. No pancreatic ductal dilation is visualized. There was prominence of the common bile duct on the prior study in this patient post cholecystectomy. Correlate with any hepatic or biliary laboratory abnormalities and any new symptoms. 5. Aortic atherosclerosis. Aortic Atherosclerosis (ICD10-I70.0). Electronically Signed   By: Donzetta Kohut M.D.   On:  06/14/2020 15:31      ASSESSMENT & PLAN:  1. Lung nodule    Images were independently reviewed by me and discussed with patient and her mother..  Persistent Right lung nodule with mild speculation I recommend to obtain PET scan for further evaluation.  Based on PET scan findings, will decide next  steps.  Orders Placed This Encounter  Procedures  . NM PET Image Initial (PI) Skull Base To Thigh    Standing Status:   Future    Standing Expiration Date:   06/20/2021    Order Specific Question:   If indicated for the ordered procedure, I authorize the administration of a radiopharmaceutical per Radiology protocol    Answer:   Yes    Order Specific Question:   Preferred imaging location?    Answer:   [facility]    All questions were answered. The patient knows to call the clinic with any problems questions or concerns.  cc Kathryne Hitch, Lessie Dings*    Return of visit: We will meet patient again after PET scan and discussed neck steps. Thank you for this kind referral and the opportunity to participate in the care of this patient. A copy of today's note is routed to referring provider    Rickard Patience, MD, PhD Hematology Oncology [facility] at [facility] Pager- [phone] 06/20/2020

## 2020-06-20 NOTE — Progress Notes (Signed)
  Oncology Nurse Navigator Documentation  Navigator Location: [facility]-Med Onc (06/20/20 1300) Referral Date to RadOnc/MedOnc: 06/15/20 (06/20/20 1300) )Navigator Encounter Type: Initial MedOnc (06/20/20 1300)   Abnormal Finding Date: 06/14/20 (06/20/20 1300)                   Treatment Phase: Abnormal Scans (06/20/20 1300) Barriers/Navigation Needs: Coordination of Care (06/20/20 1300)   Interventions: Coordination of Care (06/20/20 1300)   Coordination of Care: Appts;Radiology (06/20/20 1300)        Acuity: Level 2-Minimal Needs (1-2 Barriers Identified) (06/20/20 1300)    met with patient and her mother during initial consult with Dr. Tasia Catchings. All questions answered during visit. Reviewed upcoming appts with pt. Contact info given and instructed to call with any questions or needs. Pt verbalized understanding. Nothing further needed at this time.     Time Spent with Patient: 60 (06/20/20 1300)

## 2020-06-20 NOTE — Progress Notes (Signed)
New patient evaluation.   

## 2020-07-02 ENCOUNTER — Other Ambulatory Visit: Payer: Self-pay

## 2020-07-02 ENCOUNTER — Ambulatory Visit
Admission: RE | Admit: 2020-07-02 | Discharge: 2020-07-02 | Disposition: A | Discharge status: Home / Self Care | Payer: Medicare Other | Source: Ambulatory Visit | Attending: Oncology | Admitting: Oncology

## 2020-07-02 DIAGNOSIS — R911 Solitary pulmonary nodule: Secondary | ICD-10-CM | POA: Insufficient documentation

## 2020-07-02 LAB — GLUCOSE, CAPILLARY: Glucose-Capillary: 109 mg/dL — ABNORMAL HIGH (ref 70–99)

## 2020-07-02 MED ORDER — FLUDEOXYGLUCOSE F - 18 (FDG) INJECTION
7.8700 | Freq: Once | INTRAVENOUS | Status: AC | PRN
  Administered 2020-07-02: 7.87 via INTRAVENOUS

## 2020-07-05 ENCOUNTER — Other Ambulatory Visit: Payer: Self-pay

## 2020-07-05 ENCOUNTER — Encounter: Payer: Self-pay | Admitting: Oncology

## 2020-07-05 ENCOUNTER — Encounter: Payer: Self-pay | Admitting: *Deleted

## 2020-07-05 ENCOUNTER — Inpatient Hospital Stay: Payer: Medicare Other | Attending: Oncology | Admitting: Oncology

## 2020-07-05 VITALS — BP 144/72 | HR 101 | Temp 99.9°F | Wt 154.4 lb

## 2020-07-05 DIAGNOSIS — R918 Other nonspecific abnormal finding of lung field: Secondary | ICD-10-CM | POA: Diagnosis not present

## 2020-07-05 DIAGNOSIS — R911 Solitary pulmonary nodule: Secondary | ICD-10-CM | POA: Diagnosis not present

## 2020-07-05 DIAGNOSIS — Z87891 Personal history of nicotine dependence: Secondary | ICD-10-CM | POA: Insufficient documentation

## 2020-07-05 DIAGNOSIS — E039 Hypothyroidism, unspecified: Secondary | ICD-10-CM | POA: Insufficient documentation

## 2020-07-05 NOTE — Progress Notes (Signed)
Hematology/Oncology Consult note [facility] Telephone:([phone] [phone]   Patient Care Team: Dorothey Baseman, MD as PCP - General (Family Medicine) Glory Buff, RN as Oncology Nurse Navigator  REFERRING PROVIDER: Dorothey Baseman, MD  CHIEF COMPLAINTS/REASON FOR VISIT:  lung nodule  HISTORY OF PRESENTING ILLNESS:   Michele James is a  65 y.o.  female with PMH listed below was seen in consultation at the request of  Dorothey Baseman, MD  for evaluation of lung nodule Patient has a history of seizure disorder/history of cerebral palsy Patient is a poor historian.  She was accompanied by her mother who has hearing deficiency and not able to provide much history.Marland Kitchen   History was mainly obtained from reviewing medical records.  06/14/2020, CT chest with contrast showed a lobulated nodule with mildly spiculated margin in the posterior right chest persist and is suspicious for bronchogenic neoplasm based on morphologic repeat years Nodule along the minor fissure in the right chest measures 6 mm, nonspecific.  Moderately large hiatal hernia 11 mm distal common bile duct, previously 8 to 9 mm.  No pancreatic duct dilatation is visualized.  Prominence of the common bile duct on the prior study in this patient's post cholecystectomy.  Aortic atherosclerosis.  Patient was referred to establish care with oncology for further evaluation of the lung nodule.  She denies any cough, shortness of breath, hemoptysis, patient is a former smoker, and quit in 1982.  She reports only smoked for 4 months and mostly on weekends.  Patient has contractures and physical deformities which severely limits her  mobility.  She lives at home with parents.   INTERVAL HISTORY Michele James is a 65 y.o. female who has above history reviewed by me today presents for follow up visit for management of lung nodule Problems and complaints are listed below: Patient was accompanied by  her father. No new complaints.  Review of Systems  Constitutional: Positive for appetite change. Negative for fatigue and unexpected weight change.  Respiratory: Negative for cough and shortness of breath.   Cardiovascular: Negative for chest pain.  Gastrointestinal: Negative for abdominal pain.  Musculoskeletal: Negative for back pain.  Neurological: Positive for seizures.  Hematological: Does not bruise/bleed easily.  Psychiatric/Behavioral: Negative for confusion.    MEDICAL HISTORY:  Past Medical History:  Diagnosis Date  . Anemia   . Arthritis    knees  . Cerebral palsy (HCC)   . Depression   . Diabetes mellitus without complication (HCC)   . Diverticulosis   . Dysphagia   . Eczema   . GERD (gastroesophageal reflux disease)   . Glaucoma   . Hemorrhoids   . Hyperlipidemia   . Hypothyroidism   . Seizures (HCC)    none in over 10 yrs    SURGICAL HISTORY: Past Surgical History:  Procedure Laterality Date  . BREAST BIOPSY Right 03/22/2020   stereo bx, ribbon clip, path pending   . CATARACT EXTRACTION W/PHACO Right 02/06/2016   Procedure: CATARACT EXTRACTION PHACO AND INTRAOCULAR LENS PLACEMENT (IOC);  Surgeon: Lockie Mola, MD;  Location: [facility];  Service: Ophthalmology;  Laterality: Right;  . CATARACT EXTRACTION W/PHACO Left 03/05/2016   Procedure: CATARACT EXTRACTION PHACO AND INTRAOCULAR LENS PLACEMENT (IOC);  Surgeon: Lockie Mola, MD;  Location: [facility];  Service: Ophthalmology;  Laterality: Left;  . CHOLECYSTECTOMY N/A 11/16/2015   Procedure: LAPAROSCOPIC CHOLECYSTECTOMY;  Surgeon: Tiney Rouge III, MD;  Location: [facility];  Service: General;  Laterality: N/A;  attempted cholangiogram  .  COLONOSCOPY WITH PROPOFOL N/A 12/25/2014   Procedure: COLONOSCOPY WITH PROPOFOL;  Surgeon: Wallace CullensPaul Y Oh, MD;  Location: [facility];  Service: Gastroenterology;  Laterality: N/A;  . COLONOSCOPY WITH PROPOFOL N/A 04/14/2019   Procedure: COLONOSCOPY  WITH PROPOFOL;  Surgeon: Toledo, Boykin Nearingeodoro K, MD;  Location: [facility];  Service: Gastroenterology;  Laterality: N/A;  . ENDOSCOPIC RETROGRADE CHOLANGIOPANCREATOGRAPHY (ERCP) WITH PROPOFOL N/A 11/18/2015   Procedure: ENDOSCOPIC RETROGRADE CHOLANGIOPANCREATOGRAPHY (ERCP) WITH PROPOFOL;  Surgeon: Midge Miniumarren Wohl, MD;  Location: [facility];  Service: Endoscopy;  Laterality: N/A;  . ESOPHAGOGASTRODUODENOSCOPY (EGD) WITH PROPOFOL N/A 04/14/2019   Procedure: ESOPHAGOGASTRODUODENOSCOPY (EGD) WITH PROPOFOL;  Surgeon: Toledo, Boykin Nearingeodoro K, MD;  Location: [facility];  Service: Gastroenterology;  Laterality: N/A;  . EYE SURGERY     Cataract Surgery   . HERNIA REPAIR      SOCIAL HISTORY: Social History   Socioeconomic History  . Marital status: Single    Spouse name: Not on file  . Number of children: Not on file  . Years of education: Not on file  . Highest education level: Not on file  Occupational History  . Not on file  Tobacco Use  . Smoking status: Former Smoker    Quit date: 06/20/1980    Years since quitting: 40.0  . Smokeless tobacco: Never Used  . Tobacco comment: smoked socially in early 1980s (reports for 4 months)  Vaping Use  . Vaping Use: Never used  Substance and Sexual Activity  . Alcohol use: No  . Drug use: No  . Sexual activity: Not on file  Other Topics Concern  . Not on file  Social History Narrative  . Not on file   Social Determinants of Health   Financial Resource Strain: Not on file  Food Insecurity: Not on file  Transportation Needs: Not on file  Physical Activity: Not on file  Stress: Not on file  Social Connections: Not on file  Intimate Partner Violence: Not on file    FAMILY HISTORY: Family History  Problem Relation Age of Onset  . Breast cancer Maternal Aunt   . High blood pressure Mother   . Hyperlipidemia Mother   . Diabetes Mellitus II Mother   . Stroke Mother   . Osteoporosis Mother   . High blood pressure Father   . Stroke Maternal  Grandmother   . Diabetes Mellitus II Maternal Grandmother   . Stroke Maternal Grandfather   . Diabetes Mellitus II Maternal Grandfather     ALLERGIES:  is allergic to aminophylline, cinnamon, advil [ibuprofen], amoxicillin, latex, nsaids, penicillins, potassium, potassium-containing compounds, septra [sulfamethoxazole-trimethoprim], sulfa antibiotics, sulfasalazine, tape, and theophyllines.  MEDICATIONS:  Current Outpatient Medications  Medication Sig Dispense Refill  . acetaminophen (TYLENOL) 325 MG tablet Take 325 mg by mouth every 6 (six) hours as needed.    . Albuterol Sulfate 108 (90 Base) MCG/ACT AEPB Inhale 2 puffs into the lungs every 6 (six) hours as needed.    Marland Kitchen. alendronate (FOSAMAX) 70 MG tablet Take 70 mg by mouth once a week. Take with a full glass of water on an empty stomach.    . ALPRAZolam (XANAX) 0.25 MG tablet Take 0.25 mg by mouth as needed for anxiety. Before pap smears    . atorvastatin (LIPITOR) 40 MG tablet Take 40 mg by mouth daily.    . calcium citrate-vitamin D (CITRACAL+D) 315-200 MG-UNIT per tablet Take 1 tablet by mouth 2 (two) times daily.    . carbamazepine (TEGRETOL) 200 MG tablet Take 200 mg by mouth 3 (three)  times daily.    . Cholecalciferol 100 MCG (4000 UT) TABS Take 1,000 Units by mouth.    . Dexlansoprazole 30 MG capsule Take 30 mg by mouth daily.    Marland Kitchen esomeprazole (NEXIUM) 40 MG capsule Take 40 mg by mouth daily at 12 noon.    . fexofenadine (ALLEGRA) 180 MG tablet Take 180 mg by mouth daily.    Marland Kitchen levothyroxine (SYNTHROID, LEVOTHROID) 125 MCG tablet Take 125 mcg by mouth daily before breakfast.    . magnesium oxide (MAG-OX) 400 MG tablet Take 400 mg by mouth daily.    Marland Kitchen OLANZapine (ZYPREXA) 5 MG tablet Take 5 mg by mouth at bedtime.    Marland Kitchen PARoxetine (PAXIL) 20 MG tablet Take 20 mg by mouth daily.    . potassium chloride 20 MEQ/15ML (10%) SOLN Take 10 mEq by mouth daily.    . vitamin E 400 UNIT capsule Take 400 Units by mouth daily.    .  betamethasone valerate (VALISONE) 0.1 % cream Apply topically 2 (two) times daily as needed. (Patient not taking: Reported on 07/05/2020)    . ergocalciferol (VITAMIN D2) 50000 UNITS capsule Take 50,000 Units by mouth once a week. (Patient not taking: Reported on 07/05/2020)    . famotidine (PEPCID) 20 MG tablet Take 1 tablet by mouth 2 (two) times daily. (Patient not taking: Reported on 07/05/2020)    . fexofenadine-pseudoephedrine (ALLEGRA-D 24) 180-240 MG 24 hr tablet Take 1 tablet by mouth daily.    . hydrocortisone (ANUSOL-HC) 25 MG suppository Place 25 mg rectally 2 (two) times daily as needed for hemorrhoids or anal itching. (Patient not taking: Reported on 07/05/2020)    . metFORMIN (GLUCOPHAGE-XR) 750 MG 24 hr tablet Take 750 mg by mouth 2 (two) times daily at 10 AM and 5 PM. (Patient not taking: Reported on 07/05/2020)    . naproxen (NAPROSYN) 375 MG tablet Take 375 mg by mouth 2 (two) times daily with a meal. (Patient not taking: No sig reported)    . SUMAtriptan (IMITREX) 50 MG tablet Take 50 mg by mouth every 2 (two) hours as needed for migraine. May repeat in 2 hours if headache persists or recurs. (Patient not taking: Reported on 07/05/2020)    . Wheat Dextrin (BENEFIBER PO) Take by mouth. (Patient not taking: Reported on 07/05/2020)     No current facility-administered medications for this visit.     PHYSICAL EXAMINATION: ECOG PERFORMANCE STATUS: 2 - Symptomatic, <50% confined to bed Vitals:   07/05/20 1039  BP: (!) 144/72  Pulse: (!) 101  Temp: 99.9 F (37.7 C)  SpO2: 97%   Filed Weights   07/05/20 1039  Weight: 154 lb 6.4 oz (70 kg)    Physical Exam Constitutional:      General: She is not in acute distress.    Comments: Patient sits in the wheelchair  HENT:     Head: Normocephalic and atraumatic.  Eyes:     General: No scleral icterus. Cardiovascular:     Rate and Rhythm: Normal rate and regular rhythm.     Heart sounds: Normal heart sounds.  Pulmonary:     Effort:  Pulmonary effort is normal. No respiratory distress.     Breath sounds: No wheezing.  Abdominal:     General: Bowel sounds are normal. There is no distension.     Palpations: Abdomen is soft.  Musculoskeletal:        General: Deformity present.     Cervical back: Normal range of motion and neck supple.  Comments: Contracture of left upper extremity in the left lower extremity.  Skin:    General: Skin is warm and dry.     Findings: No erythema or rash.  Neurological:     Mental Status: She is alert. Mental status is at baseline.  Psychiatric:        Mood and Affect: Mood normal.     LABORATORY DATA:  I have reviewed the data as listed Lab Results  Component Value Date   WBC 7.2 05/16/2020   HGB 13.5 05/16/2020   HCT 39.3 05/16/2020   MCV 95.4 05/16/2020   PLT 297 05/16/2020   Recent Labs    07/15/19 1719 05/16/20 1334 06/14/20 1013  NA 138 136  --   K 4.1 3.6  --   CL 105 104  --   CO2 24 21*  --   GLUCOSE 90 135*  --   BUN 15 19  --   CREATININE 0.58 0.63 0.50  CALCIUM 9.0 9.0  --   GFRNONAA >60 >60  --   GFRAA >60  --   --   PROT 7.1 7.0  --   ALBUMIN 3.7 3.8  --   AST 15 25  --   ALT 14 21  --   ALKPHOS 71 72  --   BILITOT 0.6 0.4  --   BILIDIR  --  <0.1  --   IBILI  --  NOT CALCULATED  --    Iron/TIBC/Ferritin/ %Sat    Component Value Date/Time   IRON 14 (L) 07/15/2019 1719   TIBC 406 07/15/2019 1719   FERRITIN 5 (L) 07/15/2019 1719   IRONPCTSAT 3 (L) 07/15/2019 1719      RADIOGRAPHIC STUDIES: I have personally reviewed the radiological images as listed and agreed with the findings in the report. CT CHEST W CONTRAST  Result Date: 06/14/2020 CLINICAL DATA:  Follow-up CT scan for lung nodule in a 65 year old female. EXAM: CT CHEST WITH CONTRAST TECHNIQUE: Multidetector CT imaging of the chest was performed during intravenous contrast administration. CONTRAST:  75mL OMNIPAQUE IOHEXOL 300 MG/ML  SOLN COMPARISON:  March 28, 2020 FINDINGS:  Cardiovascular: Calcified and noncalcified atheromatous plaque in the thoracic aorta. No aneurysmal dilation. Heart size is normal without pericardial effusion. Central pulmonary vasculature is unremarkable on venous phase assessment. Mediastinum/Nodes: Thoracic inlet structures are unremarkable. No axillary lymphadenopathy. No mediastinal lymphadenopathy. No hilar lymphadenopathy. Esophagus patulous with signs of hiatal hernia. Lungs/Pleura: Nodule along the minor fissure in the RIGHT chest measures 6 mm (image 68, series 3) Lobulated nodule along the posterior RIGHT lower lobe on image 97 of series 3 measures 1.4 x 0.9 cm. This is not changed compared to December of 2021. Mild subpleural reticulation. Airways are patent. Upper Abdomen: Incidental imaging of upper abdominal contents with a levin mm distal common bile duct. No pancreatic ductal dilation is visualized. There was prominence of the common bile duct on the prior study in this patient post cholecystectomy. Imaged portions of liver, spleen and adrenal glands as well as the kidneys without acute process. Moderately large hiatal hernia proximally 50% of the stomach herniated into the chest. Musculoskeletal: No acute musculoskeletal process. Spinal degenerative changes. IMPRESSION: 1. Lobulated nodule with mildly spiculated margins in the posterior RIGHT chest persists and is suspicious for bronchogenic neoplasm based on morphologic features. Referral to multi disciplinary oncologic setting is suggested with PET scan as warranted. 2. Nodule along the minor fissure in the RIGHT chest measures 6 mm. Nonspecific finding, suggest attention on follow-up  and on PET if performed. 3. Moderately large hiatal hernia proximally 50% of the stomach herniated into the chest. 4. Incidental imaging of upper abdominal contents with 11 mm distal common bile duct. Previously 8-9 mm. No pancreatic ductal dilation is visualized. There was prominence of the common bile duct on  the prior study in this patient post cholecystectomy. Correlate with any hepatic or biliary laboratory abnormalities and any new symptoms. 5. Aortic atherosclerosis. Aortic Atherosclerosis (ICD10-I70.0). Electronically Signed   By: Donzetta Kohut M.D.   On: 06/14/2020 15:31   NM PET Image Initial (PI) Skull Base To Thigh  Result Date: 07/03/2020 CLINICAL DATA:  Initial treatment strategy for pulmonary nodule. EXAM: NUCLEAR MEDICINE PET SKULL BASE TO THIGH TECHNIQUE: 7.9 mCi F-18 FDG was injected intravenously. Full-ring PET imaging was performed from the skull base to thigh after the radiotracer. CT data was obtained and used for attenuation correction and anatomic localization. Fasting blood glucose: 109 mg/dl COMPARISON:  None. FINDINGS: Mediastinal blood pool activity: SUV max 2.2 Liver activity: SUV max NA NECK: No hypermetabolic lymph nodes in the neck. Incidental CT findings: none CHEST: Nodular pleuroparenchymal thickening at the RIGHT lung base measuring 12 mm (image 112/series 3) compares to 13 mm on prior. This nodule has low metabolic activity with SUV max equal 1.9. A ground-glass nodule more superior measures 7 mm (image 97/3) with mild metabolic activity (SUV max equal 2.2). Third nodule in the RIGHT middle lobe laterally measures 7 mm without metabolic activity. No hypermetabolic mediastinal lymph nodes Incidental CT findings: Large hiatal hernia. ABDOMEN/PELVIS: No abnormal hypermetabolic activity within the liver, pancreas, adrenal glands, or spleen. No hypermetabolic lymph nodes in the abdomen or pelvis. Incidental CT findings: none SKELETON: No focal hypermetabolic activity to suggest skeletal metastasis. Incidental CT findings: none IMPRESSION: Low metabolic activity associated with RIGHT lobe pulmonary nodules favor benign nodularity. As low-grade adenocarcinoma can have low metabolic activity, recommend follow-up CT without contrast in 6 months. No metastatic adenopathy. No distant  metastatic disease Electronically Signed   By: Genevive Bi M.D.   On: 07/03/2020 14:18      ASSESSMENT & PLAN:  1. Lung nodule    PET scan was independently reviewed by me and discussed with patient. No metabolic activity associated with the right lobe pulmonary nodules favored benign nodularity.  Low-grade adenocarcinoma can have low metabolic activity as well. I recommend patient to follow-up with CT scan without contrast in 6 months.   Orders Placed This Encounter  Procedures  . CT Chest W Contrast    Standing Status:   Future    Standing Expiration Date:   07/05/2021    Order Specific Question:   If indicated for the ordered procedure, I authorize the administration of contrast media per Radiology protocol    Answer:   Yes    Order Specific Question:   Preferred imaging location?    Answer:   [facility]    All questions were answered. The patient knows to call the clinic with any problems questions or concerns.  cc Dorothey Baseman, MD    Return of visit: 6 months.  Rickard Patience, MD, PhD Hematology Oncology [facility] Pager- [phone] 07/05/2020

## 2020-07-05 NOTE — Progress Notes (Signed)
  Oncology Nurse Navigator Documentation  Navigator Location: [facility] (07/05/20 1100)   )Navigator Encounter Type: Appt/Treatment Plan Review (07/05/20 1100)                         Barriers/Navigation Needs: No Barriers At This Time (07/05/20 1100)   Interventions: None Required (07/05/20 1100)         No navigational needs at this time. Observation only recommended to follow up pulmonary nodule. Will be happy to follow in the future if needed.

## 2020-07-05 NOTE — Progress Notes (Signed)
Pt does not report any new concerns.

## 2020-07-06 ENCOUNTER — Other Ambulatory Visit: Payer: Self-pay

## 2020-07-06 DIAGNOSIS — R911 Solitary pulmonary nodule: Secondary | ICD-10-CM

## 2021-01-04 ENCOUNTER — Ambulatory Visit
Admission: RE | Admit: 2021-01-04 | Discharge: 2021-01-04 | Disposition: A | Discharge status: Home / Self Care | Payer: Medicare Other | Source: Ambulatory Visit | Attending: Oncology | Admitting: Oncology

## 2021-01-04 ENCOUNTER — Other Ambulatory Visit: Payer: Self-pay

## 2021-01-04 DIAGNOSIS — R911 Solitary pulmonary nodule: Secondary | ICD-10-CM | POA: Insufficient documentation

## 2021-01-08 ENCOUNTER — Ambulatory Visit: Payer: Medicare Other | Admitting: Oncology

## 2021-01-09 ENCOUNTER — Inpatient Hospital Stay: Payer: Medicare Other | Attending: Oncology | Admitting: Oncology

## 2021-01-09 ENCOUNTER — Other Ambulatory Visit: Payer: Self-pay

## 2021-01-09 ENCOUNTER — Encounter: Payer: Self-pay | Admitting: Oncology

## 2021-01-09 VITALS — BP 135/58 | HR 83 | Temp 97.8°F | Resp 16

## 2021-01-09 DIAGNOSIS — Z87891 Personal history of nicotine dependence: Secondary | ICD-10-CM | POA: Insufficient documentation

## 2021-01-09 DIAGNOSIS — E039 Hypothyroidism, unspecified: Secondary | ICD-10-CM | POA: Diagnosis not present

## 2021-01-09 DIAGNOSIS — G809 Cerebral palsy, unspecified: Secondary | ICD-10-CM | POA: Diagnosis not present

## 2021-01-09 DIAGNOSIS — G40909 Epilepsy, unspecified, not intractable, without status epilepticus: Secondary | ICD-10-CM | POA: Diagnosis not present

## 2021-01-09 DIAGNOSIS — R911 Solitary pulmonary nodule: Secondary | ICD-10-CM | POA: Diagnosis present

## 2021-01-09 DIAGNOSIS — R399 Unspecified symptoms and signs involving the genitourinary system: Secondary | ICD-10-CM | POA: Diagnosis not present

## 2021-01-09 NOTE — Progress Notes (Signed)
Hematology/Oncology follow up note Michele James [phone] [phone]   Patient Care Team: Dorothey Baseman, MD as PCP - General (Family Medicine) Glory Buff, RN as Oncology Nurse Navigator  REFERRING PROVIDER: Dorothey Baseman, MD  CHIEF COMPLAINTS/REASON FOR VISIT:  lung nodule  HISTORY OF PRESENTING ILLNESS:   Michele James is a  65 y.o.  female with PMH listed below was seen in consultation at the request of  Dorothey Baseman, MD  for evaluation of lung nodule Patient has a history of seizure disorder/history of cerebral palsy Patient is a poor historian.  She was accompanied by her mother who has hearing deficiency and not able to provide much history.Marland Kitchen   History was mainly obtained from reviewing medical records.  06/14/2020, CT chest with contrast showed a lobulated nodule with mildly spiculated margin in the posterior right chest persist and is suspicious for bronchogenic neoplasm based on morphologic repeat years Nodule along the minor fissure in the right chest measures 6 mm, nonspecific.  Moderately large hiatal hernia 11 mm distal common bile duct, previously 8 to 9 mm.  No pancreatic duct dilatation is visualized.  Prominence of the common bile duct on the prior study in this patient's post cholecystectomy.  Aortic atherosclerosis.  Patient was referred to establish care with oncology for further evaluation of the lung nodule.  She denies any cough, shortness of breath, hemoptysis, patient is a former smoker, and quit in 1982.  She reports only smoked for 4 months and mostly on weekends.  Patient has contractures and physical deformities which severely limits her  mobility.  She lives at home with parents.   INTERVAL HISTORY Michele James is a 65 y.o. female who has above history reviewed by me today presents for follow up visit for management of lung nodule She reports urine is "hot".  She has had urine collected by primary  care physician, 01/01/2021 UA negative for nitrite, leukocyte esterase. Urine culture mixed urogenital flora.  She was put on Cipro empirically.    Review of Systems  Constitutional:  Positive for appetite change. Negative for fatigue and unexpected weight change.  Respiratory:  Negative for cough and shortness of breath.   Cardiovascular:  Negative for chest pain.  Gastrointestinal:  Negative for abdominal pain.  Musculoskeletal:  Negative for back pain.  Hematological:  Does not bruise/bleed easily.  Psychiatric/Behavioral:  Negative for confusion.    MEDICAL HISTORY:  Past Medical History:  Diagnosis Date   Anemia    Arthritis    knees   Cerebral palsy (HCC)    Depression    Diabetes mellitus without complication (HCC)    Diverticulosis    Dysphagia    Eczema    GERD (gastroesophageal reflux disease)    Glaucoma    Hemorrhoids    Hyperlipidemia    Hypothyroidism    Seizures (HCC)    none in over 10 yrs    SURGICAL HISTORY: Past Surgical History:  Procedure Laterality Date   BREAST BIOPSY Right 03/22/2020   stereo bx, ribbon clip, path pending    CATARACT EXTRACTION W/PHACO Right 02/06/2016   Procedure: CATARACT EXTRACTION PHACO AND INTRAOCULAR LENS PLACEMENT (IOC);  Surgeon: Lockie Mola, MD;  Location: [facility];  Service: Ophthalmology;  Laterality: Right;   CATARACT EXTRACTION W/PHACO Left 03/05/2016   Procedure: CATARACT EXTRACTION PHACO AND INTRAOCULAR LENS PLACEMENT (IOC);  Surgeon: Lockie Mola, MD;  Location: [facility];  Service: Ophthalmology;  Laterality: Left;   CHOLECYSTECTOMY N/A 11/16/2015  Procedure: LAPAROSCOPIC CHOLECYSTECTOMY;  Surgeon: Tiney Rouge III, MD;  Location: [facility];  Service: General;  Laterality: N/A;  attempted cholangiogram   COLONOSCOPY WITH PROPOFOL N/A 12/25/2014   Procedure: COLONOSCOPY WITH PROPOFOL;  Surgeon: Wallace Cullens, MD;  Location: [facility];  Service: Gastroenterology;  Laterality: N/A;    COLONOSCOPY WITH PROPOFOL N/A 04/14/2019   Procedure: COLONOSCOPY WITH PROPOFOL;  Surgeon: Toledo, Boykin Nearing, MD;  Location: [facility];  Service: Gastroenterology;  Laterality: N/A;   ENDOSCOPIC RETROGRADE CHOLANGIOPANCREATOGRAPHY (ERCP) WITH PROPOFOL N/A 11/18/2015   Procedure: ENDOSCOPIC RETROGRADE CHOLANGIOPANCREATOGRAPHY (ERCP) WITH PROPOFOL;  Surgeon: Midge Minium, MD;  Location: [facility];  Service: Endoscopy;  Laterality: N/A;   ESOPHAGOGASTRODUODENOSCOPY (EGD) WITH PROPOFOL N/A 04/14/2019   Procedure: ESOPHAGOGASTRODUODENOSCOPY (EGD) WITH PROPOFOL;  Surgeon: Toledo, Boykin Nearing, MD;  Location: [facility];  Service: Gastroenterology;  Laterality: N/A;   EYE SURGERY     Cataract Surgery    HERNIA REPAIR      SOCIAL HISTORY: Social History   Socioeconomic History   Marital status: Single    Spouse name: Not on file   Number of children: Not on file   Years of education: Not on file   Highest education level: Not on file  Occupational History   Not on file  Tobacco Use   Smoking status: Former    Types: Cigarettes    Quit date: 06/20/1980    Years since quitting: 40.5   Smokeless tobacco: Never   Tobacco comments:    smoked socially in early 1980s (reports for 4 months)  Vaping Use   Vaping Use: Never used  Substance and Sexual Activity   Alcohol use: No   Drug use: No   Sexual activity: Not on file  Other Topics Concern   Not on file  Social History Narrative   Not on file   Social Determinants of Health   Financial Resource Strain: Not on file  Food Insecurity: Not on file  Transportation Needs: Not on file  Physical Activity: Not on file  Stress: Not on file  Social Connections: Not on file  Intimate Partner Violence: Not on file    FAMILY HISTORY: Family History  Problem Relation Age of Onset   Breast cancer Maternal Aunt    High blood pressure Mother    Hyperlipidemia Mother    Diabetes Mellitus II Mother    Stroke Mother    Osteoporosis  Mother    High blood pressure Father    Stroke Maternal Grandmother    Diabetes Mellitus II Maternal Grandmother    Stroke Maternal Grandfather    Diabetes Mellitus II Maternal Grandfather     ALLERGIES:  is allergic to aminophylline, cinnamon, advil [ibuprofen], amoxicillin, latex, nsaids, penicillins, potassium, potassium-containing compounds, septra [sulfamethoxazole-trimethoprim], sulfa antibiotics, sulfasalazine, tape, and theophyllines.  MEDICATIONS:  Current Outpatient Medications  Medication Sig Dispense Refill   acetaminophen (TYLENOL) 325 MG tablet Take 325 mg by mouth every 6 (six) hours as needed.     alendronate (FOSAMAX) 70 MG tablet Take 70 mg by mouth once a week. Take with a full glass of water on an empty stomach.     ALPRAZolam (XANAX) 0.25 MG tablet Take 0.25 mg by mouth as needed for anxiety. Before pap smears     atorvastatin (LIPITOR) 40 MG tablet Take 40 mg by mouth daily.     betamethasone valerate (VALISONE) 0.1 % cream Apply topically 2 (two) times daily as needed.     calcium citrate-vitamin D (CITRACAL+D) 315-200 MG-UNIT per tablet  Take 1 tablet by mouth 2 (two) times daily.     carbamazepine (TEGRETOL) 200 MG tablet Take 200 mg by mouth 3 (three) times daily.     Cholecalciferol 100 MCG (4000 UT) TABS Take 1,000 Units by mouth.     Dexlansoprazole 30 MG capsule Take 30 mg by mouth daily.     ergocalciferol (VITAMIN D2) 50000 UNITS capsule Take 50,000 Units by mouth once a week.     esomeprazole (NEXIUM) 40 MG capsule Take 40 mg by mouth daily at 12 noon.     famotidine (PEPCID) 20 MG tablet Take 1 tablet by mouth 2 (two) times daily.     hydrocortisone (ANUSOL-HC) 25 MG suppository Place 25 mg rectally 2 (two) times daily as needed for hemorrhoids or anal itching.     hydroxypropyl methylcellulose / hypromellose (ISOPTO TEARS / GONIOVISC) 2.5 % ophthalmic solution 1 drop as needed for dry eyes.     levothyroxine (SYNTHROID, LEVOTHROID) 125 MCG tablet Take 125  mcg by mouth daily before breakfast.     magnesium oxide (MAG-OX) 400 MG tablet Take 400 mg by mouth daily.     OLANZapine (ZYPREXA) 5 MG tablet Take 5 mg by mouth at bedtime.     PARoxetine (PAXIL) 20 MG tablet Take 20 mg by mouth daily.     potassium chloride 20 MEQ/15ML (10%) SOLN Take 10 mEq by mouth daily.     SUMAtriptan (IMITREX) 50 MG tablet Take 50 mg by mouth every 2 (two) hours as needed for migraine. May repeat in 2 hours if headache persists or recurs.     vitamin E 400 UNIT capsule Take 400 Units by mouth daily.     Albuterol Sulfate 108 (90 Base) MCG/ACT AEPB Inhale 2 puffs into the lungs every 6 (six) hours as needed. (Patient not taking: Reported on 01/09/2021)     fexofenadine (ALLEGRA) 180 MG tablet Take 180 mg by mouth daily. (Patient not taking: Reported on 01/09/2021)     fexofenadine-pseudoephedrine (ALLEGRA-D 24) 180-240 MG 24 hr tablet Take 1 tablet by mouth daily. (Patient not taking: Reported on 01/09/2021)     metFORMIN (GLUCOPHAGE-XR) 750 MG 24 hr tablet Take 750 mg by mouth 2 (two) times daily at 10 AM and 5 PM. (Patient not taking: No sig reported)     naproxen (NAPROSYN) 375 MG tablet Take 375 mg by mouth 2 (two) times daily with a meal. (Patient not taking: No sig reported)     Wheat Dextrin (BENEFIBER PO) Take by mouth. (Patient not taking: No sig reported)     No current facility-administered medications for this visit.     PHYSICAL EXAMINATION: ECOG PERFORMANCE STATUS: 2 - Symptomatic, <50% confined to bed Vitals:   01/09/21 1027  BP: (!) 135/58  Pulse: 83  Resp: 16  Temp: 97.8 F (36.6 C)  SpO2: 98%   There were no vitals filed for this visit.   Physical Exam Constitutional:      General: She is not in acute distress.    Comments: Patient sits in the wheelchair  HENT:     Head: Normocephalic and atraumatic.  Eyes:     General: No scleral icterus. Cardiovascular:     Rate and Rhythm: Normal rate and regular rhythm.     Heart sounds: Normal  heart sounds.  Pulmonary:     Effort: Pulmonary effort is normal. No respiratory distress.     Breath sounds: No wheezing.  Abdominal:     General: Bowel sounds are normal. There  is no distension.     Palpations: Abdomen is soft.  Musculoskeletal:        General: Deformity present.     Cervical back: Normal range of motion and neck supple.     Comments: Contracture of left upper extremity in the left lower extremity.  Skin:    General: Skin is warm and dry.     Findings: No erythema or rash.  Neurological:     Mental Status: She is alert. Mental status is at baseline.  Psychiatric:        Mood and Affect: Mood normal.    LABORATORY DATA:  I have reviewed the data as listed Lab Results  Component Value Date   WBC 7.2 05/16/2020   HGB 13.5 05/16/2020   HCT 39.3 05/16/2020   MCV 95.4 05/16/2020   PLT 297 05/16/2020   Recent Labs    05/16/20 1334 06/14/20 1013  NA 136  --   K 3.6  --   CL 104  --   CO2 21*  --   GLUCOSE 135*  --   BUN 19  --   CREATININE 0.63 0.50  CALCIUM 9.0  --   GFRNONAA >60  --   PROT 7.0  --   ALBUMIN 3.8  --   AST 25  --   ALT 21  --   ALKPHOS 72  --   BILITOT 0.4  --   BILIDIR <0.1  --   IBILI NOT CALCULATED  --     Iron/TIBC/Ferritin/ %Sat    Component Value Date/Time   IRON 14 (L) 07/15/2019 1719   TIBC 406 07/15/2019 1719   FERRITIN 5 (L) 07/15/2019 1719   IRONPCTSAT 3 (L) 07/15/2019 1719      RADIOGRAPHIC STUDIES: I have personally reviewed the radiological images as listed and agreed with the findings in the report. CT Chest Wo Contrast  Result Date: 01/07/2021 CLINICAL DATA:  Lung nodule follow-up EXAM: CT CHEST WITHOUT CONTRAST TECHNIQUE: Multidetector CT imaging of the chest was performed following the standard protocol without IV contrast. COMPARISON:  PET-CT dated July 02, 2020; CT chest dated June 14, 2020; CT abdomen and pelvis dated March 28, 2020 FINDINGS: Cardiovascular: Normal heart size. Trace pericardial  fluid. No significant coronary artery calcifications. Atherosclerotic disease of the thoracic aorta. Mediastinum/Nodes: Small hiatal hernia thyroid is unremarkable. No enlarged lymph nodes seen in the chest. Lungs/Pleura: Central airways are patent. No consolidation, pleural effusion or pneumothorax. Solid juxtapleural right lower lobe pulmonary nodule is unchanged in size compared to prior exam measuring 1.4 x 0.9 cm on series 4 image 113. Additional smaller solid nodules are stable compared to prior exam. Reference nodule of the right upper lobe measuring 5 mm on image 75. Upper Abdomen: Cholecystectomy clips.  No acute abnormality. Musculoskeletal: No chest wall mass or suspicious bone lesions identified. IMPRESSION: Juxtapleural nodule of the right lower lobe is unchanged in size when compared with prior exams and demonstrated low metabolic activity on PET-CT, favor benign etiology such as post infectious scarring. Additional 1 year follow-up chest CT could be considered based on patient risk factors. Aortic Atherosclerosis (ICD10-I70.0). Electronically Signed   By: Allegra Lai M.D.   On: 01/07/2021 08:34       ASSESSMENT & PLAN:  1. Lung nodule   2. Symptoms involving urinary system    # Right lung nodule 07/02/2020 no activity on PET scan. 01/04/2021, CT chest without contrast showed stable size of the lung nodule.  Likely benign. Recommend to  repeat CT chest without contrast in 1 year.  #Urinary symptoms.  Recent UA/urine culture not diagnostic for UTI.  Patient has been treated with Cipro.  Recommend patient to call primary care provider's office  Orders Placed This Encounter  Procedures   CT CHEST WO CONTRAST    Standing Status:   Future    Standing Expiration Date:   01/09/2022    Order Specific Question:   Preferred imaging location?    Answer:   [facility]    Order Specific Question:   Radiology Contrast Protocol - do NOT remove file path    Answer:    [email or web address]    All questions were answered. The patient knows to call the clinic with any problems questions or concerns.  cc Dorothey Baseman, MD    Return of visit: 6 months.  Rickard Patience, MD, PhD Hematology Oncology [facility] Pager- [phone] 01/09/2021

## 2021-01-09 NOTE — Progress Notes (Signed)
Pt states that her "urine is hot"

## 2021-03-17 ENCOUNTER — Emergency Department
Admission: EM | Admit: 2021-03-17 | Discharge: 2021-03-21 | Disposition: A | Discharge status: Home / Self Care | Payer: Medicare Other | Attending: Student in an Organized Health Care Education/Training Program | Admitting: Student in an Organized Health Care Education/Training Program

## 2021-03-17 ENCOUNTER — Emergency Department: Payer: Medicare Other

## 2021-03-17 ENCOUNTER — Other Ambulatory Visit: Payer: Self-pay

## 2021-03-17 DIAGNOSIS — E039 Hypothyroidism, unspecified: Secondary | ICD-10-CM | POA: Insufficient documentation

## 2021-03-17 DIAGNOSIS — Y900 Blood alcohol level of less than 20 mg/100 ml: Secondary | ICD-10-CM | POA: Diagnosis not present

## 2021-03-17 DIAGNOSIS — Z20822 Contact with and (suspected) exposure to covid-19: Secondary | ICD-10-CM | POA: Insufficient documentation

## 2021-03-17 DIAGNOSIS — R4182 Altered mental status, unspecified: Secondary | ICD-10-CM | POA: Insufficient documentation

## 2021-03-17 DIAGNOSIS — R443 Hallucinations, unspecified: Secondary | ICD-10-CM

## 2021-03-17 DIAGNOSIS — Z87891 Personal history of nicotine dependence: Secondary | ICD-10-CM | POA: Diagnosis not present

## 2021-03-17 DIAGNOSIS — E119 Type 2 diabetes mellitus without complications: Secondary | ICD-10-CM | POA: Insufficient documentation

## 2021-03-17 DIAGNOSIS — Z91040 Latex allergy status: Secondary | ICD-10-CM | POA: Diagnosis not present

## 2021-03-17 DIAGNOSIS — R441 Visual hallucinations: Secondary | ICD-10-CM | POA: Diagnosis not present

## 2021-03-17 DIAGNOSIS — Z7984 Long term (current) use of oral hypoglycemic drugs: Secondary | ICD-10-CM | POA: Insufficient documentation

## 2021-03-17 DIAGNOSIS — Z79899 Other long term (current) drug therapy: Secondary | ICD-10-CM | POA: Diagnosis not present

## 2021-03-17 LAB — COMPREHENSIVE METABOLIC PANEL
ALT: 20 U/L (ref 0–44)
AST: 27 U/L (ref 15–41)
Albumin: 4.1 g/dL (ref 3.5–5.0)
Alkaline Phosphatase: 83 U/L (ref 38–126)
Anion gap: 6 (ref 5–15)
BUN: 21 mg/dL (ref 8–23)
CO2: 25 mmol/L (ref 22–32)
Calcium: 9.3 mg/dL (ref 8.9–10.3)
Chloride: 107 mmol/L (ref 98–111)
Creatinine, Ser: 0.6 mg/dL (ref 0.44–1.00)
GFR, Estimated: >60 mL/min (ref >60)
Glucose, Bld: 119 mg/dL — ABNORMAL HIGH (ref 70–99)
Potassium: 4.1 mmol/L (ref 3.5–5.1)
Sodium: 138 mmol/L (ref 135–145)
Total Bilirubin: 0.6 mg/dL (ref 0.3–1.2)
Total Protein: 7.6 g/dL (ref 6.5–8.1)

## 2021-03-17 LAB — SALICYLATE LEVEL: Salicylate Lvl: <7 mg/dL — ABNORMAL LOW (ref 7.0–30.0)

## 2021-03-17 LAB — RESP PANEL BY RT-PCR (FLU A&B, COVID) ARPGX2
Influenza A by PCR: NEGATIVE
Influenza B by PCR: NEGATIVE
SARS Coronavirus 2 by RT PCR: NEGATIVE

## 2021-03-17 LAB — CBC
HCT: 37.4 % (ref 36.0–46.0)
Hemoglobin: 13 g/dL (ref 12.0–15.0)
MCH: 33.2 pg (ref 26.0–34.0)
MCHC: 34.8 g/dL (ref 30.0–36.0)
MCV: 95.4 fL (ref 80.0–100.0)
Platelets: 302 10*3/uL (ref 150–400)
RBC: 3.92 MIL/uL (ref 3.87–5.11)
RDW: 12.3 % (ref 11.5–15.5)
WBC: 6.5 10*3/uL (ref 4.0–10.5)
nRBC: 0 % (ref 0.0–0.2)

## 2021-03-17 LAB — URINE DRUG SCREEN, QUALITATIVE (ARMC ONLY)
Amphetamines, Ur Screen: NOT DETECTED
Barbiturates, Ur Screen: NOT DETECTED
Benzodiazepine, Ur Scrn: NOT DETECTED
Cannabinoid 50 Ng, Ur ~~LOC~~: NOT DETECTED
Cocaine Metabolite,Ur ~~LOC~~: NOT DETECTED
MDMA (Ecstasy)Ur Screen: NOT DETECTED
Methadone Scn, Ur: NOT DETECTED
Opiate, Ur Screen: NOT DETECTED
Phencyclidine (PCP) Ur S: NOT DETECTED
Tricyclic, Ur Screen: NOT DETECTED

## 2021-03-17 LAB — ACETAMINOPHEN LEVEL: Acetaminophen (Tylenol), Serum: <10 ug/mL — ABNORMAL LOW (ref 10–30)

## 2021-03-17 LAB — ETHANOL: Alcohol, Ethyl (B): <10 mg/dL (ref <10)

## 2021-03-17 MED ORDER — ACETAMINOPHEN 325 MG PO TABS
325.0000 mg | ORAL_TABLET | Freq: Four times a day (QID) | ORAL | Status: DC | PRN
  Administered 2021-03-19 – 2021-03-20 (×3): 325 mg via ORAL
  Filled 2021-03-17 (×2): qty 1

## 2021-03-17 MED ORDER — ACETAMINOPHEN 325 MG PO TABS
650.0000 mg | ORAL_TABLET | Freq: Once | ORAL | Status: AC
  Administered 2021-03-17: 23:00:00 650 mg via ORAL
  Filled 2021-03-17: qty 2

## 2021-03-17 MED ORDER — FAMOTIDINE 20 MG PO TABS
20.0000 mg | ORAL_TABLET | Freq: Two times a day (BID) | ORAL | Status: DC
  Administered 2021-03-18 – 2021-03-21 (×7): 20 mg via ORAL
  Filled 2021-03-17 (×7): qty 1

## 2021-03-17 MED ORDER — MAGNESIUM OXIDE -MG SUPPLEMENT 400 (240 MG) MG PO TABS
400.0000 mg | ORAL_TABLET | Freq: Three times a day (TID) | ORAL | Status: DC
  Administered 2021-03-18 – 2021-03-21 (×8): 400 mg via ORAL
  Filled 2021-03-17 (×8): qty 1

## 2021-03-17 MED ORDER — OLANZAPINE 5 MG PO TABS: 7.5000 mg | ORAL_TABLET | Freq: Every day | ORAL | Status: DC

## 2021-03-17 MED ORDER — PANTOPRAZOLE SODIUM 40 MG PO TBEC
40.0000 mg | DELAYED_RELEASE_TABLET | Freq: Every day | ORAL | Status: DC
  Administered 2021-03-18 – 2021-03-20 (×3): 40 mg via ORAL
  Filled 2021-03-17 (×3): qty 1

## 2021-03-17 MED ORDER — PAROXETINE HCL 20 MG PO TABS
20.0000 mg | ORAL_TABLET | Freq: Every day | ORAL | Status: DC
  Administered 2021-03-18: 09:00:00 20 mg via ORAL
  Filled 2021-03-17 (×2): qty 1

## 2021-03-17 MED ORDER — LEVOTHYROXINE SODIUM 112 MCG PO TABS
112.0000 ug | ORAL_TABLET | Freq: Every day | ORAL | Status: DC
  Administered 2021-03-18 – 2021-03-21 (×4): 112 ug via ORAL
  Filled 2021-03-17 (×5): qty 1

## 2021-03-17 MED ORDER — CARBAMAZEPINE 200 MG PO TABS: 200.0000 mg | ORAL_TABLET | Freq: Three times a day (TID) | ORAL | Status: DC

## 2021-03-17 MED ORDER — ATORVASTATIN CALCIUM 20 MG PO TABS
40.0000 mg | ORAL_TABLET | Freq: Every day | ORAL | Status: DC
  Administered 2021-03-18 – 2021-03-21 (×4): 40 mg via ORAL
  Filled 2021-03-17 (×4): qty 2

## 2021-03-17 NOTE — ED Triage Notes (Signed)
Pt here from EMS- pt states she is here because "her mother needs to be straightened out"- pt here with her father who called EMS- pt father states that she has a psych hx and has been hallucinating far 6 months but it has gotten worse over the last 2 weeks- pt olanzepine was recently increased from 5 to 7.5- pt has not missed any of her psych meds

## 2021-03-17 NOTE — ED Triage Notes (Signed)
Pt in via EMS from home with c/o hallucinations. Pt with hx of manic depression. EMS was called out for psych eval. Pt medication paxil was increased Friday and hallucinations started shortly after. Pt with psych hx as well

## 2021-03-17 NOTE — ED Notes (Signed)
Requesting tylenol for mild back pain

## 2021-03-17 NOTE — ED Notes (Signed)
This RN sent urine sample under wrong patient, due to similar names. Lab called and informed of mistake. States they will fix on their end and to send new sample when available with new order. MD notified.

## 2021-03-17 NOTE — ED Notes (Signed)
Pt is wheelchair bound- unable to safely dress out in triage

## 2021-03-17 NOTE — ED Notes (Signed)
2 person assist to bathroom via wheelchair.; unable to provide sample. Dressed into behavioral scrubs and belongings secured in 1 labeled bag. Pt remains wearing knee brace.

## 2021-03-17 NOTE — ED Provider Notes (Signed)
[facility] Emergency Department Provider Note    Event Date/Time   First MD Initiated Contact with Patient 03/17/21 1808     (approximate)  I have reviewed the triage vital signs and the nursing notes.   HISTORY  Chief Complaint Psychiatric Evaluation  Level V CaveaT:  poor historian  HPI Michele James is a 65 y.o. female with history of cerebral palsy diabetes and depression for olanzapine with recent increase in medication for is having increasing oversedation confusion and odd behavior.  Brought in by family.  Patient denies any complaints or discomfort.  The patient's father she has a history of schizophrenia and has been having increasing hallucinations as well as increasing.  Normally over the past few weeks.  Has called the police 2 times last week with increasing odd behavior.  Called her psychiatrist and they were unable to be seen in clinic but had her olanzapine increased on Friday.  Feels like the symptoms have progressively worsened even since then.  No report of any SI or HI. Past Medical History:  Diagnosis Date   Anemia    Arthritis    knees   Cerebral palsy (HCC)    Depression    Diabetes mellitus without complication (HCC)    Diverticulosis    Dysphagia    Eczema    GERD (gastroesophageal reflux disease)    Glaucoma    Hemorrhoids    Hyperlipidemia    Hypothyroidism    Seizures (HCC)    none in over 10 yrs   Family History  Problem Relation Age of Onset   Breast cancer Maternal Aunt    High blood pressure Mother    Hyperlipidemia Mother    Diabetes Mellitus II Mother    Stroke Mother    Osteoporosis Mother    High blood pressure Father    Stroke Maternal Grandmother    Diabetes Mellitus II Maternal Grandmother    Stroke Maternal Grandfather    Diabetes Mellitus II Maternal Grandfather    Past Surgical History:  Procedure Laterality Date   BREAST BIOPSY Right 03/22/2020   stereo bx, ribbon clip, path pending     CATARACT EXTRACTION W/PHACO Right 02/06/2016   Procedure: CATARACT EXTRACTION PHACO AND INTRAOCULAR LENS PLACEMENT (IOC);  Surgeon: Lockie Mola, MD;  Location: [facility];  Service: Ophthalmology;  Laterality: Right;   CATARACT EXTRACTION W/PHACO Left 03/05/2016   Procedure: CATARACT EXTRACTION PHACO AND INTRAOCULAR LENS PLACEMENT (IOC);  Surgeon: Lockie Mola, MD;  Location: [facility];  Service: Ophthalmology;  Laterality: Left;   CHOLECYSTECTOMY N/A 11/16/2015   Procedure: LAPAROSCOPIC CHOLECYSTECTOMY;  Surgeon: Tiney Rouge III, MD;  Location: [facility];  Service: General;  Laterality: N/A;  attempted cholangiogram   COLONOSCOPY WITH PROPOFOL N/A 12/25/2014   Procedure: COLONOSCOPY WITH PROPOFOL;  Surgeon: Wallace Cullens, MD;  Location: [facility];  Service: Gastroenterology;  Laterality: N/A;   COLONOSCOPY WITH PROPOFOL N/A 04/14/2019   Procedure: COLONOSCOPY WITH PROPOFOL;  Surgeon: Toledo, Boykin Nearing, MD;  Location: [facility] END[facility];  Service: Gastroenterology;  Laterality: N/A;   ENDOSCOPIC RETROGRADE CHOLANGIOPANCREATOGRAPHY (ERCP) WITH PROPOFOL N/A 11/18/2015   Procedure: ENDOSCOPIC RETROGRADE CHOLANGIOPANCREATOGRAPHY (ERCP) WITH PROPOFOL;  Surgeon: Midge Minium, MD;  Location: [facility];  Service: Endoscopy;  Laterality: N/A;   ESOPHAGOGASTRODUODENOSCOPY (EGD) WITH PROPOFOL N/A 04/14/2019   Procedure: ESOPHAGOGASTRODUODENOSCOPY (EGD) WITH PROPOFOL;  Surgeon: Toledo, Boykin Nearing, MD;  Location: [facility];  Service: Gastroenterology;  Laterality: N/A;   EYE SURGERY     Cataract Surgery  HERNIA REPAIR     Patient Active Problem List   Diagnosis Date Noted   Calculus of common duct    Elevated bilirubin    Dilated cbd, acquired    Cholecystitis with cholelithiasis 11/15/2015   RUQ pain    Cerebral palsy ([place]) 11/08/2014   Osteoporosis, 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 Authorizing Provider  acetaminophen (TYLENOL) 325 MG tablet Take 325 mg by mouth every 6 (six) hours as needed.    [provider]  Albuterol Sulfate 108 (90 Base) MCG/ACT AEPB Inhale 2 puffs into the lungs every 6 (six) hours as needed. Patient not taking: Reported on 01/09/2021    [provider]  alendronate (FOSAMAX) 70 MG tablet Take 70 mg by mouth once a week. Take with a full glass of water on an empty stomach.    [provider]  ALPRAZolam Duanne Moron) 0.25 MG tablet Take 0.25 mg by mouth as needed for anxiety. Before pap smears    [provider]  atorvastatin (LIPITOR) 40 MG tablet Take 40 mg by mouth daily.    [provider]  betamethasone valerate (VALISONE) 0.1 % cream Apply topically 2 (two) times daily as needed.    [provider]  calcium citrate-vitamin D (CITRACAL+D) 315-200 MG-UNIT per tablet Take 1 tablet by mouth 2 (two) times daily.    [provider]  carbamazepine (TEGRETOL) 200 MG tablet Take 200 mg by mouth 3 (three) times daily.    [provider]  Cholecalciferol 100 MCG (4000 UT) TABS Take 1,000 Units by mouth.    [provider]  Dexlansoprazole 30 MG capsule Take 30 mg by mouth daily.    [provider]  ergocalciferol (VITAMIN D2) 50000 UNITS capsule Take 50,000 Units by mouth once a week.    [provider]  esomeprazole (NEXIUM) 40 MG capsule Take 40 mg by mouth daily at 12 noon.    [provider]  famotidine (PEPCID) 20 MG tablet Take 1 tablet by mouth 2 (two) times daily. 04/23/20   [provider]  fexofenadine (ALLEGRA) 180 MG tablet Take 180 mg by mouth daily. Patient not taking: Reported on 01/09/2021    [provider]  fexofenadine-pseudoephedrine  (ALLEGRA-D 24) 180-240 MG 24 hr tablet Take 1 tablet by mouth daily. Patient not taking: Reported on 01/09/2021    [provider]  hydrocortisone (ANUSOL-HC) 25 MG suppository Place 25 mg rectally 2 (two) times daily as needed for hemorrhoids or anal itching.    [provider]  hydroxypropyl methylcellulose / hypromellose (ISOPTO TEARS / GONIOVISC) 2.5 % ophthalmic solution 1 drop as needed for dry eyes.    [provider]  levothyroxine (SYNTHROID, LEVOTHROID) 125 MCG tablet Take 125 mcg by mouth daily before breakfast.    [provider]  magnesium oxide (MAG-OX) 400 MG tablet Take 400 mg by mouth daily.    [provider]  metFORMIN (GLUCOPHAGE-XR) 750 MG 24 hr tablet Take 750 mg by mouth 2 (two) times daily at 10 AM and 5 PM. Patient not taking: No sig reported    [provider]  naproxen (NAPROSYN) 375 MG tablet Take 375 mg by mouth 2 (two) times daily with a meal. Patient not taking: No sig reported  [provider]  OLANZapine (ZYPREXA) 5 MG tablet Take 5 mg by mouth at bedtime.    [provider]  PARoxetine (PAXIL) 20 MG tablet Take 20 mg by mouth daily.    [provider]  potassium chloride 20 MEQ/15ML (10%) SOLN Take 10 mEq by mouth daily.    [provider]  SUMAtriptan (IMITREX) 50 MG tablet Take 50 mg by mouth every 2 (two) hours as needed for migraine. May repeat in 2 hours if headache persists or recurs.    [provider]  vitamin E 400 UNIT capsule Take 400 Units by mouth daily.    [provider]  Wheat Dextrin (BENEFIBER PO) Take by mouth. Patient not taking: No sig reported    [provider]    Allergies Aminophylline, Cinnamon, Advil [ibuprofen], Amoxicillin, Latex, Nsaids, Penicillins, Potassium, Potassium-containing compounds, Septra [sulfamethoxazole-trimethoprim], Sulfa antibiotics, Sulfasalazine, Tape, and Theophyllines    Social  History Social History   Tobacco Use   Smoking status: Former    Types: Cigarettes    Quit date: 06/20/1980    Years since quitting: 40.7   Smokeless tobacco: Never   Tobacco comments:    smoked socially in early 1980s (reports for 4 months)  Vaping Use   Vaping Use: Never used  Substance Use Topics   Alcohol use: No   Drug use: No    Review of Systems Patient denies headaches, rhinorrhea, blurry vision, numbness, shortness of breath, chest pain, edema, cough, abdominal pain, nausea, vomiting, diarrhea, dysuria, fevers, rashes or hallucinations unless otherwise stated above in HPI. ____________________________________________   PHYSICAL EXAM:  VITAL SIGNS: Vitals:   03/17/21 1527 03/17/21 1935  BP: 118/69 (!) 119/44  Pulse: 93 81  Resp: 18 14  Temp: 98.7 F (37.1 C) 98.7 F (37.1 C)  SpO2: 98% 96%    Constitutional: Alert, no acute distress Eyes: Conjunctivae are normal.  Head: Atraumatic. Nose: No congestion/rhinnorhea. Mouth/Throat: Mucous membranes are moist.   Neck: No stridor. Painless ROM.  Cardiovascular: Normal rate, regular rhythm. Grossly normal heart sounds.  Good peripheral circulation. Respiratory: Normal respiratory effort.  No retractions. Lungs CTAB. Gastrointestinal: Soft and nontender. No distention. No abdominal bruits. No CVA tenderness. Genitourinary:  Musculoskeletal: No lower extremity tenderness nor edema.   Neurologic:   No gross focal neurologic deficits are appreciated. No facial droop Skin:  Skin is warm, dry and intact. No rash noted. Psychiatric: somewhat odd,  calm and cooperative  ____________________________________________   LABS (all labs ordered are listed, but only abnormal results are displayed)  Results for orders placed or performed during the hospital encounter of 03/17/21 (from the past 24 hour(s))  Comprehensive metabolic panel     Status: Abnormal   Collection Time: 03/17/21  3:30 PM  Result Value Ref Range    Sodium 138 135 - 145 mmol/L   Potassium 4.1 3.5 - 5.1 mmol/L   Chloride 107 98 - 111 mmol/L   CO2 25 22 - 32 mmol/L   Glucose, Bld 119 (H) 70 - 99 mg/dL   BUN 21 8 - 23 mg/dL   Creatinine, Ser 0.60 0.44 - 1.00 mg/dL   Calcium 9.3 8.9 - 10.3 mg/dL   Total Protein 7.6 6.5 - 8.1 g/dL   Albumin 4.1 3.5 - 5.0 g/dL   AST 27 15 - 41 U/L   ALT 20 0 - 44 U/L   Alkaline Phosphatase 83 38 - 126 U/L   Total Bilirubin 0.6 0.3 - 1.2 mg/dL   GFR, Estimated >60 >  60 mL/min   Anion gap 6 5 - 15  Ethanol     Status: None   Collection Time: 03/17/21  3:30 PM  Result Value Ref Range   Alcohol, Ethyl (B) Q000111Q Q000111Q mg/dL  Salicylate level     Status: Abnormal   Collection Time: 03/17/21  3:30 PM  Result Value Ref Range   Salicylate Lvl Q000111Q (L) 7.0 - 30.0 mg/dL  Acetaminophen level     Status: Abnormal   Collection Time: 03/17/21  3:30 PM  Result Value Ref Range   Acetaminophen (Tylenol), Serum <10 (L) 10 - 30 ug/mL  cbc     Status: None   Collection Time: 03/17/21  3:30 PM  Result Value Ref Range   WBC 6.5 4.0 - 10.5 K/uL   RBC 3.92 3.87 - 5.11 MIL/uL   Hemoglobin 13.0 12.0 - 15.0 g/dL   HCT 37.4 36.0 - 46.0 %   MCV 95.4 80.0 - 100.0 fL   MCH 33.2 26.0 - 34.0 pg   MCHC 34.8 30.0 - 36.0 g/dL   RDW 12.3 11.5 - 15.5 %   Platelets 302 150 - 400 K/uL   nRBC 0.0 0.0 - 0.2 %  Resp Panel by RT-PCR (Flu A&B, Covid) Nasopharyngeal Swab     Status: None   Collection Time: 03/17/21  6:31 PM   Specimen: Nasopharyngeal Swab; Nasopharyngeal(NP) swabs in vial transport medium  Result Value Ref Range   SARS Coronavirus 2 by RT PCR NEGATIVE NEGATIVE   Influenza A by PCR NEGATIVE NEGATIVE   Influenza B by PCR NEGATIVE NEGATIVE   _____________________________________________________  RADIOLOGY  I personally reviewed all radiographic images ordered to evaluate for the above acute complaints and reviewed radiology reports and findings.  These findings were personally discussed with the patient.  Please see  medical record for radiology report.  ____________________________________________   PROCEDURES  Procedure(s) performed:  Procedures    Critical Care performed: no ____________________________________________   INITIAL IMPRESSION / ASSESSMENT AND PLAN / ED COURSE  Pertinent labs & imaging results that were available during my care of the patient were reviewed by me and considered in my medical decision making (see chart for details).   DDX: Psychosis, delirium, medication effect, noncompliance, polysubstance abuse, Si, Hi, depression   Michele James is a 65 y.o. who presents to the ED with presentation as described above.  Blood work as well as imaging will be ordered to evaluate for the but differential.  Likely underlying psychiatric illness possibly related to medication changes.  Will consult psychiatry.  Clinical Course as of 03/17/21 2036  Nancy Fetter Mar 17, 2021  2023 CT without evidence of acute abnormality.  Remote lacunar infarct noted.  No sign of traumatic acute intracranial abnormality. [PR]    Clinical Course User Index [PR] Merlyn Lot, MD   The patient has been placed in psychiatric observation due to the need to provide a safe environment for the patient while obtaining psychiatric consultation and evaluation, as well as ongoing medical and medication management to treat the patient's condition.  The patient is here voluntary at this time.  The patient was evaluated in Emergency Department today for the symptoms described in the history of present illness. He/she was evaluated in the context of the global COVID-19 pandemic, which necessitated consideration that the patient might be at risk for infection with the SARS-CoV-2 virus that causes COVID-19. Institutional protocols and algorithms that pertain to the evaluation of patients at risk for COVID-19 are in a state of  rapid change based on information released by regulatory bodies including the CDC and federal and  state organizations. These policies and algorithms were followed during the patient's care in the ED.  As part of my medical decision making, I reviewed the following data within the [place] notes reviewed and incorporated, Labs reviewed, notes from prior ED visits and [place] Controlled Substance Database   ____________________________________________   FINAL CLINICAL IMPRESSION(S) / ED DIAGNOSES  Final diagnoses:  Hallucinations      NEW MEDICATIONS STARTED DURING THIS VISIT:  New Prescriptions   No medications on file     Note:  This document was prepared using Dragon voice recognition software and may include unintentional dictation errors.    Merlyn Lot, MD 03/17/21 2036

## 2021-03-18 DIAGNOSIS — R441 Visual hallucinations: Secondary | ICD-10-CM

## 2021-03-18 DIAGNOSIS — R443 Hallucinations, unspecified: Secondary | ICD-10-CM | POA: Diagnosis not present

## 2021-03-18 LAB — TSH: TSH: 0.686 u[IU]/mL (ref 0.350–4.500)

## 2021-03-18 LAB — T4, FREE: Free T4: 0.8 ng/dL (ref 0.61–1.12)

## 2021-03-18 MED ORDER — OXCARBAZEPINE 300 MG PO TABS
300.0000 mg | ORAL_TABLET | Freq: Two times a day (BID) | ORAL | Status: DC
  Administered 2021-03-18 – 2021-03-21 (×7): 300 mg via ORAL
  Filled 2021-03-18 (×7): qty 1

## 2021-03-18 MED ORDER — OLANZAPINE 5 MG PO TABS
5.0000 mg | ORAL_TABLET | Freq: Every day | ORAL | Status: DC
  Administered 2021-03-18 – 2021-03-19 (×2): 5 mg via ORAL
  Filled 2021-03-18 (×2): qty 1

## 2021-03-18 NOTE — BH Assessment (Signed)
Comprehensive Clinical Assessment (CCA) Note  03/18/2021 Michele James [id number]  Corliss Blacker, 65 year old female who presents to [facility] ED voluntarily for treatment. Per triage note, Pt in via EMS from home with c/o hallucinations. Pt with hx of manic depression. EMS was called out for psych eval. Pt medication Paxil was increased Friday and hallucinations started shortly after. Pt with psych hx as well.    During TTS assessment pt presents alert and oriented x 4, anxious but cooperative, and mood-congruent with affect. The pt does not appear to be responding to internal or external stimuli. Neither is the pt presenting with any delusional thinking. Pt verified the information provided to triage RN.   Pt identifies her main complaint to be that she was brought to the ED due to urine leakage. Patient is disorganized in thought stating she is pregnant and trying to hold it in. Family reports patient has become more confused and her hallucinations have increased and worsened over the past couple of weeks. Patient reports she lives with both of her parents, aunt and a young boy. Patient states her appetite and sleep habits are good. Patient reports she takes her medication as prescribed; however, a new medicine that was given to her makes her feel sick. Patient denies using any illicit substances and alcohol. Pt denies current SI/HI. Patient does report having visual hallucinations where she can see people fighting one another. Patient requires use of wheelchair.  Per Sallye Ober, NP, pt is recommended for inpatient psychiatric admission. ([facility]).   Chief Complaint:  Chief Complaint  Patient presents with   Psychiatric Evaluation   Visit Diagnosis: Visual hallucinations    CCA Screening, Triage and Referral (STR)  Patient Reported Information How did you hear about [place]? Family/Friend  Referral name: No data recorded Referral phone number: No data recorded  Whom do you see for routine  medical problems? No data recorded Practice/Facility Name: No data recorded Practice/Facility Phone Number: No data recorded Name of Contact: No data recorded Contact Number: No data recorded Contact Fax Number: No data recorded Prescriber Name: No data recorded Prescriber Address (if known): No data recorded  What Is the Reason for Your Visit/Call Today? Patient reports she was brought to the ED due to urine leakage.  How Long Has This Been Causing You Problems? <Week  What Do You Feel Would Help You the Most Today? Medication(s) (Assessment)   Have You Recently Been in Any Inpatient Treatment (Hospital/Detox/Crisis Center/28-Day Program)? No data recorded Name/Location of Program/Hospital:No data recorded How Long Were You There? No data recorded When Were You Discharged? No data recorded  Have You Ever Received Services From [facility] Before? No data recorded Who Do You See at [facility]? No data recorded  Have You Recently Had Any Thoughts About Hurting Yourself? No  Are You Planning to Commit Suicide/Harm Yourself At This time? No   Have you Recently Had Thoughts About Hurting Someone Karolee Ohs? No  Explanation: No data recorded  Have You Used Any Alcohol or Drugs in the Past 24 Hours? No  How Long Ago Did You Use Drugs or Alcohol? No data recorded What Did You Use and How Much? No data recorded  Do You Currently Have a Therapist/Psychiatrist? No  Name of Therapist/Psychiatrist: No data recorded  Have You Been Recently Discharged From Any Office Practice or Programs? No  Explanation of Discharge From Practice/Program: No data recorded    CCA Screening Triage Referral Assessment Type of Contact: Face-to-Face  Is this Initial or  Reassessment? No data recorded Date Telepsych consult ordered in [facility]:  No data recorded Time Telepsych consult ordered in [facility]:  No data recorded  Patient Reported Information Reviewed? No data recorded Patient Left Without Being Seen?  No data recorded Reason for Not Completing Assessment: No data recorded  Collateral Involvement: None provided   Does Patient Have a [place]? No data recorded Name and Contact of Legal Guardian: No data recorded If Minor and Not Living with Parent(s), Who has Custody? No data recorded Is CPS involved or ever been involved? No data recorded Is APS involved or ever been involved? Never   Patient Determined To Be At Risk for Harm To Self or Others Based on Review of Patient Reported Information or Presenting Complaint? No  Method: No data recorded Availability of Means: No data recorded Intent: No data recorded Notification Required: No data recorded Additional Information for Danger to Others Potential: No data recorded Additional Comments for Danger to Others Potential: No data recorded Are There Guns or Other Weapons in Your Home? No data recorded Types of Guns/Weapons: No data recorded Are These Weapons Safely Secured?                            No data recorded Who Could Verify You Are Able To Have These Secured: No data recorded Do You Have any Outstanding Charges, Pending Court Dates, Parole/Probation? No data recorded Contacted To Inform of Risk of Harm To Self or Others: No data recorded  Location of Assessment: [facility] ED   Does Patient Present under Involuntary Commitment? No  IVC Papers Initial File Date: No data recorded  [place] of Residence: [place]   Patient Currently Receiving the Following Services: Medication Management; CST Marine scientist)   Determination of Need: Urgent (48 hours)   Options For Referral: Medication Management; Geropsychiatric Facility     CCA Biopsychosocial Intake/Chief Complaint:  No data recorded Current Symptoms/Problems: No data recorded  Patient Reported Schizophrenia/Schizoaffective Diagnosis in Past: No   Strengths: Patient able to communicate.  Preferences: No data recorded Abilities:  No data recorded  Type of Services Patient Feels are Needed: No data recorded  Initial Clinical Notes/Concerns: No data recorded  Mental Health Symptoms Depression:   None   Duration of Depressive symptoms: No data recorded  Mania:   Change in energy/activity   Anxiety:    None   Psychosis:   Hallucinations   Duration of Psychotic symptoms:  Less than six months   Trauma:   None   Obsessions:   None   Compulsions:   None   Inattention:   Disorganized   Hyperactivity/Impulsivity:   Feeling of restlessness   Oppositional/Defiant Behaviors:   None   Emotional Irregularity:   Potentially harmful impulsivity   Other Mood/Personality Symptoms:  No data recorded   Mental Status Exam Appearance and self-care  Stature:   Average   Weight:   Average weight   Clothing:   Casual   Grooming:   Normal   Cosmetic use:   None   Posture/gait:   Normal   Motor activity:  No data recorded  Sensorium  Attention:   Confused   Concentration:   Scattered   Orientation:   X5   Recall/memory:   Normal   Affect and Mood  Affect:   Anxious   Mood:   Anxious   Relating  Eye contact:   Normal   Facial expression:   Responsive  Attitude toward examiner:   Cooperative   Thought and Language  Speech flow:  Pressured   Thought content:   Appropriate to Mood and Circumstances   Preoccupation:   Ruminations   Hallucinations:   Visual   Organization:  No data recorded  [employer] of Knowledge:   Average   Intelligence:   Average   Abstraction:   Abstract   Judgement:   Impaired   Reality Testing:   Distorted   Insight:   Unaware   Decision Making:   Impulsive; Confused   Social Functioning  Social Maturity:   Impulsive   Social Judgement:   Normal   Stress  Stressors:  No data recorded  Coping Ability:   Resilient   Skill Deficits:   Activities of daily living   Supports:   Family      Religion:    Leisure/Recreation:    Exercise/Diet: Exercise/Diet Do You Follow a Special Diet?: No Do You Have Any Trouble Sleeping?: No   CCA Employment/Education Employment/Work Situation: Employment / Work Technical sales engineer: On disability Has Patient ever Been in Passenger transport manager?: No  Education: Education Is Patient Currently Attending School?: No   CCA Family/Childhood History Family and Relationship History:    Childhood History:  Childhood History By whom was/is the patient raised?: Both parents  Child/Adolescent Assessment:     CCA Substance Use Alcohol/Drug Use: Alcohol / Drug Use Pain Medications: See PTA Prescriptions: See PTA Over the Counter: See PTA History of alcohol / drug use?: No history of alcohol / drug abuse Longest period of sobriety (when/how long): Unknown                         ASAM's:  Six Dimensions of Multidimensional Assessment  Dimension 1:  Acute Intoxication and/or Withdrawal Potential:      Dimension 2:  Biomedical Conditions and Complications:      Dimension 3:  Emotional, Behavioral, or Cognitive Conditions and Complications:     Dimension 4:  Readiness to Change:     Dimension 5:  Relapse, Continued use, or Continued Problem Potential:     Dimension 6:  Recovery/Living Environment:     ASAM Severity Score:    ASAM Recommended Level of Treatment:     Substance use Disorder (SUD)    Recommendations for Services/Supports/Treatments:    DSM5 Diagnoses: Patient Active Problem List   Diagnosis Date Noted   Visual hallucinations 03/18/2021   Calculus of common duct    Elevated bilirubin    Dilated cbd, acquired    Cholecystitis with cholelithiasis 11/15/2015   RUQ pain    Cerebral palsy ([place]) 11/08/2014   Osteoporosis, 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   Hypothyroidism (acquired) 05/08/2014   Depression 10/17/2013   GERD (gastroesophageal reflux disease) 10/17/2013   Seizure ([place]) 10/17/2013    PMB (postmenopausal bleeding) 07/07/2013    Patient Centered Plan: Patient is on the following Treatment Plan(s):     Referrals to Alternative Service(s): Referred to Alternative Service(s):   Place:   Date:   Time:    Referred to Alternative Service(s):   Place:   Date:   Time:    Referred to Alternative Service(s):   Place:   Date:   Time:    Referred to Alternative Service(s):   Place:   Date:   Time:     Valora Norell Glennon Mac, Counselor, LCAS-A

## 2021-03-18 NOTE — BH Assessment (Signed)
Adult/GERO MH  Referral information for Psychiatric Hospitalization faxed to:   [facility] ([phone]---[phone])   [facility] [phone]- [phone], [phone])   [facility] [phone]), No female unit   [facility] [phone] or [phone]),  . [name] [phone] -or- [phone]),  [name] [phone]- fax#)

## 2021-03-18 NOTE — ED Notes (Signed)
Patient refused her snack

## 2021-03-18 NOTE — ED Notes (Signed)
Attempted to obtain urine sample from patient. Pt unable to urinate at this time. Required 2 person assist with minimal effort from patient. Pt now placed into stretcher from recliner.

## 2021-03-18 NOTE — ED Notes (Signed)
VOL, pend placement 

## 2021-03-18 NOTE — Consult Note (Addendum)
[facility] Face-to-Face Psychiatry Consult   Reason for Consult: Visual hallucinations Referring Physician: [place] Patient Identification: Michele James MRN:  [id number] Principal Diagnosis: Visual hallucinations Diagnosis:  Principal Problem:   Visual hallucinations   Total Time spent with patient: 45 minutes  Subjective:   Michele James is a 65 y.o. female patient admitted with " I see people that fight and called 911."  HPI: Patient was seen and chart was reviewed.  Patient was brought in by family, stating that she has a history of schizophrenia, however, there is no indication of that diagnosis in the chart.  Family states that she has been more confused and increasing in her hallucinations the past few weeks.  On evaluation, patient is disorganized in thought and expression.  She is calm and cooperative.  She does state that she lives with her mother, father, and aunt.  She also says a child lives with them.  She states that her sleep and her appetite had been good.  Patient's states that she is here because she thinks she might be pregnant and she has some urine leakage.  Patient says that she works at the first [employer] in [place].  Patient is in bed during the evaluation, uses a wheelchair.  She has a history of cerebral palsy.  Patient requires inpatient psychiatric hospitalization.  Per patient's [phone]) patient sees psychiatrist, Dr. Mervyn Skeeters at [facility], who was unable to see patient last week, but increased her Zyprexa to 7.5 mg. Father states it didn't help so he brought her here to hospital.  Past Psychiatric History: depression. Father states she has had "mental health issues" since 65 years old.   Risk to Self:   Risk to Others:   Prior Inpatient Therapy:   Prior Outpatient Therapy:    Past Medical History:  Past Medical History:  Diagnosis Date   Anemia    Arthritis    knees   Cerebral palsy (HCC)    Depression    Diabetes mellitus  without complication (HCC)    Diverticulosis    Dysphagia    Eczema    GERD (gastroesophageal reflux disease)    Glaucoma    Hemorrhoids    Hyperlipidemia    Hypothyroidism    Seizures (HCC)    none in over 10 yrs    Past Surgical History:  Procedure Laterality Date   BREAST BIOPSY Right 03/22/2020   stereo bx, ribbon clip, path pending    CATARACT EXTRACTION W/PHACO Right 02/06/2016   Procedure: CATARACT EXTRACTION PHACO AND INTRAOCULAR LENS PLACEMENT (IOC);  Surgeon: Lockie Mola, MD;  Location: [facility];  Service: Ophthalmology;  Laterality: Right;   CATARACT EXTRACTION W/PHACO Left 03/05/2016   Procedure: CATARACT EXTRACTION PHACO AND INTRAOCULAR LENS PLACEMENT (IOC);  Surgeon: Lockie Mola, MD;  Location: [facility];  Service: Ophthalmology;  Laterality: Left;   CHOLECYSTECTOMY N/A 11/16/2015   Procedure: LAPAROSCOPIC CHOLECYSTECTOMY;  Surgeon: Tiney Rouge III, MD;  Location: [facility] OR[facility];  Service: General;  Laterality: N/A;  attempted cholangiogram   COLONOSCOPY WITH PROPOFOL N/A 12/25/2014   Procedure: COLONOSCOPY WITH PROPOFOL;  Surgeon: Wallace Cullens, MD;  Location: [facility];  Service: Gastroenterology;  Laterality: N/A;   COLONOSCOPY WITH PROPOFOL N/A 04/14/2019   Procedure: COLONOSCOPY WITH PROPOFOL;  Surgeon: Toledo, Boykin Nearing, MD;  Location: [facility] ENDOSCOPY;  Service: Gastroenterology;  Laterality: N/A;   ENDOSCOPIC RETROGRADE CHOLANGIOPANCREATOGRAPHY (ERCP) WITH PROPOFOL N/A 11/18/2015   Procedure: ENDOSCOPIC RETROGRADE CHOLANGIOPANCREATOGRAPHY (ERCP) WITH PROPOFOL;  Surgeon: Midge Minium,  MD;  Location: [facility] ENDOSCOPY;  Service: Endoscopy;  Laterality: N/A;   ESOPHAGOGASTRODUODENOSCOPY (EGD) WITH PROPOFOL N/A 04/14/2019   Procedure: ESOPHAGOGASTRODUODENOSCOPY (EGD) WITH PROPOFOL;  Surgeon: Toledo, Boykin Nearing, MD;  Location: [facility] ENDOSCOPY;  Service: Gastroenterology;  Laterality: N/A;   EYE SURGERY     Cataract Surgery    HERNIA REPAIR     Family  History:  Family History  Problem Relation Age of Onset   Breast cancer Maternal Aunt    High blood pressure Mother    Hyperlipidemia Mother    Diabetes Mellitus II Mother    Stroke Mother    Osteoporosis Mother    High blood pressure Father    Stroke Maternal Grandmother    Diabetes Mellitus II Maternal Grandmother    Stroke Maternal Grandfather    Diabetes Mellitus II Maternal Grandfather    Family Psychiatric  History: none Social History:  Social History   Substance and Sexual Activity  Alcohol Use No     Social History   Substance and Sexual Activity  Drug Use No    Social History   Socioeconomic History   Marital status: Single    Spouse name: Not on file   Number of children: Not on file   Years of education: Not on file   Highest education level: Not on file  Occupational History   Not on file  Tobacco Use   Smoking status: Former    Types: Cigarettes    Quit date: 06/20/1980    Years since quitting: 40.7   Smokeless tobacco: Never   Tobacco comments:    smoked socially in early 1980s (reports for 4 months)  Vaping Use   Vaping Use: Never used  Substance and Sexual Activity   Alcohol use: No   Drug use: No   Sexual activity: Not on file  Other Topics Concern   Not on file  Social History Narrative   Not on file   Social Determinants of Health   Financial Resource Strain: Not on file  Food Insecurity: Not on file  Transportation Needs: Not on file  Physical Activity: Not on file  Stress: Not on file  Social Connections: Not on file   Additional Social History:    Allergies:   Allergies  Allergen Reactions   Aminophylline Other (See Comments)    Headache   Cinnamon Other (See Comments)    Stomachache    Advil [Ibuprofen] Itching and Rash    Pt states she can take ibuprofen   Amoxicillin Rash   Latex Rash   Nsaids Rash   Penicillins Nausea And Vomiting   Potassium Nausea And Vomiting and Nausea Only   Potassium-Containing Compounds  Nausea And Vomiting   Septra [Sulfamethoxazole-Trimethoprim] Rash   Sulfa Antibiotics Rash   Sulfasalazine Rash   Tape Rash    Paper tape ok   Theophyllines Other (See Comments)    Headache     Labs:  Results for orders placed or performed during the hospital encounter of 03/17/21 (from the past 48 hour(s))  Comprehensive metabolic panel     Status: Abnormal   Collection Time: 03/17/21  3:30 PM  Result Value Ref Range   Sodium 138 135 - 145 mmol/L   Potassium 4.1 3.5 - 5.1 mmol/L   Chloride 107 98 - 111 mmol/L   CO2 25 22 - 32 mmol/L   Glucose, Bld 119 (H) 70 - 99 mg/dL    Comment: Glucose reference range applies only to samples taken after fasting for at  least 8 hours.   BUN 21 8 - 23 mg/dL   Creatinine, Ser 1.61 0.44 - 1.00 mg/dL   Calcium 9.3 8.9 - 09.6 mg/dL   Total Protein 7.6 6.5 - 8.1 g/dL   Albumin 4.1 3.5 - 5.0 g/dL   AST 27 15 - 41 U/L   ALT 20 0 - 44 U/L   Alkaline Phosphatase 83 38 - 126 U/L   Total Bilirubin 0.6 0.3 - 1.2 mg/dL   GFR, Estimated >04 >54 mL/min    Comment: (NOTE) Calculated using the CKD-EPI Creatinine Equation (2021)    Anion gap 6 5 - 15    Comment: Performed at [facility], [address] [place], [place] [address]  Ethanol     Status: None   Collection Time: 03/17/21  3:30 PM  Result Value Ref Range   Alcohol, Ethyl (B) <10 <10 mg/dL    Comment: (NOTE) Lowest detectable limit for serum alcohol is 10 mg/dL.  For medical purposes only. Performed at [facility], [address] [place], [place] [address]   Salicylate level     Status: Abnormal   Collection Time: 03/17/21  3:30 PM  Result Value Ref Range   Salicylate Lvl <7.0 (L) 7.0 - 30.0 mg/dL    Comment: Performed at [facility], [address] [place], [place] [address]  Acetaminophen level     Status: Abnormal   Collection Time: 03/17/21  3:30 PM  Result Value Ref Range   Acetaminophen (Tylenol), Serum <10 (L) 10 - 30 ug/mL    Comment:  (NOTE) Therapeutic concentrations vary significantly. A range of 10-30 ug/mL  may be an effective concentration for many patients. However, some  are best treated at concentrations outside of this range. Acetaminophen concentrations >150 ug/mL at 4 hours after ingestion  and >50 ug/mL at 12 hours after ingestion are often associated with  toxic reactions.  Performed at [facility], [address] [place], [place] [address]   cbc     Status: None   Collection Time: 03/17/21  3:30 PM  Result Value Ref Range   WBC 6.5 4.0 - 10.5 K/uL   RBC 3.92 3.87 - 5.11 MIL/uL   Hemoglobin 13.0 12.0 - 15.0 g/dL   HCT 57.8 46.9 - 62.9 %   MCV 95.4 80.0 - 100.0 fL   MCH 33.2 26.0 - 34.0 pg   MCHC 34.8 30.0 - 36.0 g/dL   RDW 52.8 41.3 - 24.4 %   Platelets 302 150 - 400 K/uL   nRBC 0.0 0.0 - 0.2 %    Comment: Performed at [facility], [address] [place], [place] [address]  T4, free     Status: None   Collection Time: 03/17/21  3:30 PM  Result Value Ref Range   Free T4 0.80 0.61 - 1.12 ng/dL    Comment: (NOTE) Biotin ingestion may interfere with free T4 tests. If the results are inconsistent with the TSH level, previous test results, or the clinical presentation, then consider biotin interference. If needed, order repeat testing after stopping biotin. Performed at [facility], [address] [place], [place] [address]   TSH     Status: None   Collection Time: 03/17/21  3:30 PM  Result Value Ref Range   TSH 0.686 0.350 - 4.500 uIU/mL    Comment: Performed by a 3rd Generation assay with a functional sensitivity of <=0.01 uIU/mL. Performed at [facility], [address], [place], [place] [address]  Resp Panel by RT-PCR (Flu A&B, Covid) Nasopharyngeal Swab     Status: None   Collection Time: 03/17/21  6:31 PM   Specimen: Nasopharyngeal Swab; Nasopharyngeal(NP) swabs in vial transport medium  Result Value Ref Range   SARS Coronavirus 2 by RT  PCR NEGATIVE NEGATIVE    Comment: (NOTE) SARS-CoV-2 target nucleic acids are NOT DETECTED.  The SARS-CoV-2 RNA is generally detectable in upper respiratory specimens during the acute phase of infection. The lowest concentration of SARS-CoV-2 viral copies this assay can detect is 138 copies/mL. A negative result does not preclude SARS-Cov-2 infection and should not be used as the sole basis for treatment or other patient management decisions. A negative result may occur with  improper specimen collection/handling, submission of specimen other than nasopharyngeal swab, presence of viral mutation(s) within the areas targeted by this assay, and inadequate number of viral copies(<138 copies/mL). A negative result must be combined with clinical observations, patient history, and epidemiological information. The expected result is Negative.  Fact Sheet for Patients:  BloggerCourse.com  Fact Sheet for Healthcare Providers:  SeriousBroker.it  This test is no t yet approved or cleared by the [place] FDA and  has been authorized for detection and/or diagnosis of SARS-CoV-2 by FDA under an Emergency Use Authorization (EUA). This EUA will remain  in effect (meaning this test can be used) for the duration of the COVID-19 declaration under Section 564(b)(1) of the Act, 21 U.S.C.section 360bbb-3(b)(1), unless the authorization is terminated  or revoked sooner.       Influenza A by PCR NEGATIVE NEGATIVE   Influenza B by PCR NEGATIVE NEGATIVE    Comment: (NOTE) The Xpert Xpress SARS-CoV-2/FLU/RSV plus assay is intended as an aid in the diagnosis of influenza from Nasopharyngeal swab specimens and should not be used as a sole basis for treatment. Nasal washings and aspirates are unacceptable for Xpert Xpress SARS-CoV-2/FLU/RSV testing.  Fact Sheet for Patients: BloggerCourse.com  Fact Sheet for Healthcare  Providers: SeriousBroker.it  This test is not yet approved or cleared by the [place] FDA and has been authorized for detection and/or diagnosis of SARS-CoV-2 by FDA under an Emergency Use Authorization (EUA). This EUA will remain in effect (meaning this test can be used) for the duration of the COVID-19 declaration under Section 564(b)(1) of the Act, 21 U.S.C. section 360bbb-3(b)(1), unless the authorization is terminated or revoked.  Performed at [facility], [address] [place], [place] [address]   Urine Drug Screen, Qualitative     Status: None   Collection Time: 03/17/21  8:58 PM  Result Value Ref Range   Tricyclic, Ur Screen NONE DETECTED NONE DETECTED   Amphetamines, Ur Screen NONE DETECTED NONE DETECTED   MDMA (Ecstasy)Ur Screen NONE DETECTED NONE DETECTED   Cocaine Metabolite,Ur [place] NONE DETECTED NONE DETECTED   Opiate, Ur Screen NONE DETECTED NONE DETECTED   Phencyclidine (PCP) Ur S NONE DETECTED NONE DETECTED   Cannabinoid 50 Ng, Ur [place] NONE DETECTED NONE DETECTED   Barbiturates, Ur Screen NONE DETECTED NONE DETECTED   Benzodiazepine, Ur Scrn NONE DETECTED NONE DETECTED   Methadone Scn, Ur NONE DETECTED NONE DETECTED    Comment: (NOTE) Tricyclics + metabolites, urine    Cutoff 1000 ng/mL Amphetamines + metabolites, urine  Cutoff 1000 ng/mL MDMA (Ecstasy), urine              Cutoff 500 ng/mL Cocaine Metabolite, urine          Cutoff 300 ng/mL Opiate + metabolites, urine  Cutoff 300 ng/mL Phencyclidine (PCP), urine         Cutoff 25 ng/mL Cannabinoid, urine                 Cutoff 50 ng/mL Barbiturates + metabolites, urine  Cutoff 200 ng/mL Benzodiazepine, urine              Cutoff 200 ng/mL Methadone, urine                   Cutoff 300 ng/mL  The urine drug screen provides only a preliminary, unconfirmed analytical test result and should not be used for non-medical purposes. Clinical consideration and professional  judgment should be applied to any positive drug screen result due to possible interfering substances. A more specific alternate chemical method must be used in order to obtain a confirmed analytical result. Gas chromatography / mass spectrometry (GC/MS) is the preferred confirm atory method. Performed at [facility], [address] [place], [place] [address]     Current Facility-Administered Medications  Medication Dose Route Frequency Provider Last Rate Last Admin   acetaminophen (TYLENOL) tablet 325 mg  325 mg Oral Q6H PRN Willy Eddy, MD       atorvastatin (LIPITOR) tablet 40 mg  40 mg Oral Daily Willy Eddy, MD   40 mg at 03/18/21 0930   famotidine (PEPCID) tablet 20 mg  20 mg Oral BID Willy Eddy, MD   20 mg at 03/18/21 0930   levothyroxine (SYNTHROID) tablet 112 mcg  112 mcg Oral Q0600 Willy Eddy, MD   112 mcg at 03/18/21 0930   magnesium oxide (MAG-OX) tablet 400 mg  400 mg Oral TID Willy Eddy, MD   400 mg at 03/18/21 0930   OLANZapine (ZYPREXA) tablet 5 mg  5 mg Oral Daily Charm Rings, NP   5 mg at 03/18/21 0930   Oxcarbazepine (TRILEPTAL) tablet 300 mg  300 mg Oral BID Charm Rings, NP   300 mg at 03/18/21 0930   pantoprazole (PROTONIX) EC tablet 40 mg  40 mg Oral Daily Willy Eddy, MD   40 mg at 03/18/21 6045   PARoxetine (PAXIL) tablet 20 mg  20 mg Oral Daily Willy Eddy, MD   20 mg at 03/18/21 4098   Current Outpatient Medications  Medication Sig Dispense Refill   acetaminophen (TYLENOL) 325 MG tablet Take 325 mg by mouth every 6 (six) hours as needed.     Albuterol Sulfate 108 (90 Base) MCG/ACT AEPB Inhale 2 puffs into the lungs every 6 (six) hours as needed.     atorvastatin (LIPITOR) 40 MG tablet Take 40 mg by mouth daily.     betamethasone valerate (VALISONE) 0.1 % cream Apply topically 2 (two) times daily as needed.     calcium citrate-vitamin D (CITRACAL+D) 315-200 MG-UNIT per tablet Take 1 tablet by mouth 2  (two) times daily.     carbamazepine (TEGRETOL) 200 MG tablet Take 200 mg by mouth 3 (three) times daily.     Cholecalciferol 100 MCG (4000 UT) TABS Take 1,000 Units by mouth.     esomeprazole (NEXIUM) 40 MG capsule Take 40 mg by mouth daily at 12 noon.     famotidine (PEPCID) 20 MG tablet Take 1 tablet by mouth 2 (two) times daily.     hydrocortisone (ANUSOL-HC) 25 MG suppository Place 25 mg rectally 2 (two) times daily as needed for hemorrhoids or anal itching.     hydroxypropyl methylcellulose / hypromellose (ISOPTO TEARS / GONIOVISC) 2.5 % ophthalmic  solution 1 drop as needed for dry eyes.     levothyroxine (SYNTHROID) 112 MCG tablet Take 112 mcg by mouth every morning.     magnesium oxide (MAG-OX) 400 (240 Mg) MG tablet Take 1 tablet by mouth 3 (three) times daily.     naproxen (NAPROSYN) 375 MG tablet Take 375 mg by mouth 2 (two) times daily with a meal.     OLANZapine (ZYPREXA) 7.5 MG tablet Take 7.5 mg by mouth daily.     PARoxetine (PAXIL) 20 MG tablet Take 20 mg by mouth daily.     potassium chloride 20 MEQ/15ML (10%) SOLN Take 10 mEq by mouth daily.     vitamin E 400 UNIT capsule Take 400 Units by mouth daily.     alendronate (FOSAMAX) 70 MG tablet Take 70 mg by mouth once a week. Take with a full glass of water on an empty stomach.     ALPRAZolam (XANAX) 0.25 MG tablet Take 0.25 mg by mouth as needed for anxiety. Before pap smears     Dexlansoprazole 30 MG capsule Take 30 mg by mouth daily. (Patient not taking: Reported on 03/18/2021)     fexofenadine-pseudoephedrine (ALLEGRA-D 24) 180-240 MG 24 hr tablet Take 1 tablet by mouth daily. (Patient not taking: Reported on 01/09/2021)     levothyroxine (SYNTHROID, LEVOTHROID) 125 MCG tablet Take 125 mcg by mouth daily before breakfast. (Patient not taking: Reported on 03/18/2021)     metFORMIN (GLUCOPHAGE-XR) 750 MG 24 hr tablet Take 750 mg by mouth 2 (two) times daily at 10 AM and 5 PM. (Patient not taking: Reported on 07/05/2020)      SUMAtriptan (IMITREX) 50 MG tablet Take 50 mg by mouth every 2 (two) hours as needed for migraine. May repeat in 2 hours if headache persists or recurs.     Wheat Dextrin (BENEFIBER PO) Take by mouth. (Patient not taking: Reported on 07/05/2020)      Musculoskeletal: Strength & Muscle Tone: decreased Gait & Station:  not observed. In bed. Uses wheelchair Patient leans: N/A    Psychiatric Specialty Exam:  Presentation  General Appearance: Appropriate for Environment Eye Contact:Good Speech:Clear and Coherent Speech Volume:Normal Handedness:No data recorded  Mood and Affect  Mood:Euthymic Affect:Appropriate  Thought Process  Thought Processes:Disorganized Descriptions of Associations:Loose  Orientation:Partial  Thought Content:Scattered; Tangential  History of Schizophrenia/Schizoaffective disorder:No data recorded Duration of Psychotic Symptoms:No data recorded Hallucinations:Hallucinations: Visual  Ideas of Reference:Delusions  Suicidal Thoughts:Suicidal Thoughts: No  Homicidal Thoughts:Homicidal Thoughts: No   Sensorium  Memory:Immediate Poor Judgment:Impaired Insight:Lacking  Executive Functions  Concentration:Fair Attention Span:Fair Recall:Fair Fund of Knowledge:Fair Language:Fair  Psychomotor Activity  Psychomotor Activity:Psychomotor Activity: -- (uses wheelchair)  Assets  Assets:Resilience; Financial Resources/Insurance; Physical Health  Sleep  Sleep:Sleep: Good  Physical Exam: Physical Exam Vitals and nursing note reviewed.  HENT:     Head: Normocephalic.     Nose: No congestion or rhinorrhea.  Eyes:     General:        Right eye: No discharge.        Left eye: No discharge.  Cardiovascular:     Rate and Rhythm: Normal rate.  Pulmonary:     Effort: Pulmonary effort is normal.  Musculoskeletal:        General: Normal range of motion.     Cervical back: Normal range of motion.  Skin:    General: Skin is dry.  Neurological:      Mental Status: She is alert.  Psychiatric:  Attention and Perception: Attention normal.        Mood and Affect: Mood normal.        Speech: Speech normal.        Behavior: Behavior is cooperative.        Thought Content: Thought content is delusional. Thought content does not include homicidal or suicidal ideation. Thought content does not include homicidal or suicidal plan.        Cognition and Memory: Cognition is impaired. She exhibits impaired remote memory.        Judgment: Judgment is inappropriate.   Review of Systems  Psychiatric/Behavioral:  Positive for hallucinations. Negative for depression, memory loss and suicidal ideas. The patient has insomnia. The patient is not nervous/anxious.   All other systems reviewed and are negative. Blood pressure 131/74, pulse 82, temperature 98.1 F (36.7 C), temperature source Oral, resp. rate 15, height 5\' 4"  (1.626 m), weight 68 kg, SpO2 97 %. Body mass index is 25.75 kg/m.  Treatment Plan Summary: Daily contact with patient to assess and evaluate symptoms and progress in treatment, Medication management, and Plan . 65 year old female with visual hallucinations, family reports hx of schizophrenia. Recommend psychiatric hospitalization. Reviewed with EDP.   Disposition: Recommend psychiatric Inpatient admission when medically cleared.  66, NP 03/18/2021 10:59 AM

## 2021-03-18 NOTE — ED Provider Notes (Signed)
Emergency Medicine Observation Re-evaluation Note  Michele James is a 65 y.o. female, seen on rounds today.  Pt initially presented to the ED for complaints of Psychiatric Evaluation Currently, the patient is sleeping, voices no medical complaints.  Physical Exam  BP (!) 119/44    Pulse 81    Temp 98.7 F (37.1 C) (Oral)    Resp 14    Ht 5\' 4"  (1.626 m)    Wt 68 kg    SpO2 96%    BMI 25.75 kg/m  Physical Exam General: Resting in NAD Cardiac: No cyanosis Lungs: Equal rise and fall Psych: Not agitated  ED Course / MDM  EKG:   I have reviewed the labs performed to date as well as medications administered while in observation.  Recent changes in the last 24 hours include no events overnight.  Plan  Current plan is for psychiatric disposition.  FAYE STROHMAN is not under involuntary commitment.     [name], MD 03/18/21 [phone]

## 2021-03-18 NOTE — ED Notes (Signed)
Pt resting in hallway, RR Even and unlabored

## 2021-03-18 NOTE — ED Notes (Signed)
Pt cleaned of urine incontinence. Placed on purewick with portable suction

## 2021-03-19 DIAGNOSIS — R443 Hallucinations, unspecified: Secondary | ICD-10-CM | POA: Diagnosis not present

## 2021-03-19 LAB — URINE DRUG SCREEN, QUALITATIVE (ARMC ONLY)
Amphetamines, Ur Screen: NOT DETECTED
Barbiturates, Ur Screen: NOT DETECTED
Benzodiazepine, Ur Scrn: NOT DETECTED
Cannabinoid 50 Ng, Ur ~~LOC~~: NOT DETECTED
Cocaine Metabolite,Ur ~~LOC~~: NOT DETECTED
MDMA (Ecstasy)Ur Screen: NOT DETECTED
Methadone Scn, Ur: NOT DETECTED
Opiate, Ur Screen: NOT DETECTED
Phencyclidine (PCP) Ur S: NOT DETECTED
Tricyclic, Ur Screen: NOT DETECTED

## 2021-03-19 LAB — URINALYSIS, ROUTINE W REFLEX MICROSCOPIC
Bacteria, UA: NONE SEEN
Bilirubin Urine: NEGATIVE
Glucose, UA: NEGATIVE mg/dL
Hgb urine dipstick: NEGATIVE
Ketones, ur: NEGATIVE mg/dL
Nitrite: NEGATIVE
Protein, ur: NEGATIVE mg/dL
Specific Gravity, Urine: 1.005 (ref 1.005–1.030)
pH: 6 (ref 5.0–8.0)

## 2021-03-19 MED ORDER — OLANZAPINE 5 MG PO TABS: 5.0000 mg | ORAL_TABLET | Freq: Every day | ORAL | Status: DC

## 2021-03-19 MED ORDER — OLANZAPINE 5 MG PO TABS
2.5000 mg | ORAL_TABLET | Freq: Every day | ORAL | Status: DC
  Administered 2021-03-19 – 2021-03-21 (×3): 2.5 mg via ORAL
  Filled 2021-03-19 (×3): qty 1

## 2021-03-19 MED ORDER — PAROXETINE HCL 10 MG PO TABS
10.0000 mg | ORAL_TABLET | Freq: Every day | ORAL | Status: DC
  Administered 2021-03-19 – 2021-03-20 (×2): 10 mg via ORAL
  Filled 2021-03-19 (×2): qty 1

## 2021-03-19 NOTE — ED Provider Notes (Signed)
Emergency Medicine Observation Re-evaluation Note  Michele James is a 65 y.o. female, seen on rounds today.  Pt initially presented to the ED for complaints of Psychiatric Evaluation Currently, the patient is resting comfortably.  Physical Exam  BP 125/61 (BP Location: Right Arm)    Pulse 78    Temp 99 F (37.2 C) (Oral)    Resp 17    Ht 5\' 4"  (1.626 m)    Wt 68 kg    SpO2 95%    BMI 25.75 kg/m  Physical Exam General: No acute distress Cardiac: Well-perfused extremities Lungs: No respiratory distress Psych: Appropriate mood and affect  ED Course / MDM  EKG:   I have reviewed the labs performed to date as well as medications administered while in observation.  Recent changes in the last 24 hours include none.  Plan  Current plan is for placement.  TANGANYIKA BOWLDS is not under involuntary commitment.     [name], MD 03/19/21 [phone]

## 2021-03-19 NOTE — ED Notes (Signed)
VOL/Pending placement 

## 2021-03-19 NOTE — BH Assessment (Signed)
Referral Checks:    [name] ([phone]) No behavioral health intake staff available until morning    [facility] [phone]- [phone], [phone]) No behavioral health intake staff available until morning    [facility] [phone]), [name] reports facility has no Geriatric unit    [place] [phone] or [phone]), No answer at either number   . [name] [phone]- [phone]), [name] reports Denied due to Cerebral Palsy   [name] [phone] fax#)

## 2021-03-19 NOTE — ED Notes (Signed)
Pts purewick replaced at this time. Pt given water and ginger ale to drink per request

## 2021-03-19 NOTE — ED Notes (Signed)
VOL/pending placement 

## 2021-03-19 NOTE — BH Assessment (Addendum)
Writer contacted [name] [phone] to check on status of admission. Per Para March, patient was declined due to ADL limitations and staffing.

## 2021-03-19 NOTE — ED Notes (Addendum)
Pt didn't consume snack or drink

## 2021-03-19 NOTE — ED Notes (Signed)
Pt given dinner tray.

## 2021-03-19 NOTE — ED Notes (Signed)
Vitals taken

## 2021-03-19 NOTE — ED Notes (Signed)
Snack and drink given 

## 2021-03-20 DIAGNOSIS — R443 Hallucinations, unspecified: Secondary | ICD-10-CM | POA: Diagnosis not present

## 2021-03-20 DIAGNOSIS — R441 Visual hallucinations: Secondary | ICD-10-CM | POA: Diagnosis not present

## 2021-03-20 MED ORDER — OLANZAPINE 10 MG PO TABS
10.0000 mg | ORAL_TABLET | Freq: Every day | ORAL | Status: DC
  Administered 2021-03-20: 22:00:00 10 mg via ORAL
  Filled 2021-03-20: qty 1

## 2021-03-20 NOTE — Consult Note (Addendum)
[facility] Face-to-Face Psychiatry Consult   Reason for Consult:  re-assess Referring Physician:  EDP Patient Identification: Michele James MRN:  [id number] Principal Diagnosis: Visual hallucinations Diagnosis:  Principal Problem:   Visual hallucinations   Total Time spent with patient: 30 minutes  Subjective:   Michele James is a 65 y.o. female patient admitted with see previous.  HPI:  See previous. Patient seen and chart notes reviewed today. Patient has remained calm and cooperative in the ED. Placement has not been found for patient. She continues to have visual hallucinations that do not appear to be bothersome to her. Does not appear to have auditory hallucinations. No suicidal or homicidal ideations. Patient is pleasant, some nonsensical speech. Delusional about certain people coming and going .Not aggressive or agitated.  Will increase Zyprexa to 10 mg at bedtime. Discontinued Paxil, which had been previously down titrated.   Collateral from Father: Clinical research associate called father to alert him to the situation that we are unable  to find psychiatric placement for patient d/t her mobility issues and inability to care for herself. Father has not contacted patient's primary care provider as of yet. Advised father to notify provider of changes made to care. Father also states that he has previously talked to primary care about placing patient in nursing facility, as he states he is trying to care for his elderly wife and his sister-in-law as well as patient, and he is almost 65 years old. Advised father that he may want to accelerate that process.   Past Psychiatric History: see previous  Risk to Self:   Risk to Others:   Prior Inpatient Therapy:   Prior Outpatient Therapy:    Past Medical History:  Past Medical History:  Diagnosis Date   Anemia    Arthritis    knees   Cerebral palsy (HCC)    Depression    Diabetes mellitus without complication (HCC)    Diverticulosis    Dysphagia    Eczema     GERD (gastroesophageal reflux disease)    Glaucoma    Hemorrhoids    Hyperlipidemia    Hypothyroidism    Seizures (HCC)    none in over 10 yrs    Past Surgical History:  Procedure Laterality Date   BREAST BIOPSY Right 03/22/2020   stereo bx, ribbon clip, path pending    CATARACT EXTRACTION W/PHACO Right 02/06/2016   Procedure: CATARACT EXTRACTION PHACO AND INTRAOCULAR LENS PLACEMENT (IOC);  Surgeon: Lockie Mola, MD;  Location: [facility];  Service: Ophthalmology;  Laterality: Right;   CATARACT EXTRACTION W/PHACO Left 03/05/2016   Procedure: CATARACT EXTRACTION PHACO AND INTRAOCULAR LENS PLACEMENT (IOC);  Surgeon: Lockie Mola, MD;  Location: [facility];  Service: Ophthalmology;  Laterality: Left;   CHOLECYSTECTOMY N/A 11/16/2015   Procedure: LAPAROSCOPIC CHOLECYSTECTOMY;  Surgeon: Tiney Rouge III, MD;  Location: [facility] OR[facility];  Service: General;  Laterality: N/A;  attempted cholangiogram   COLONOSCOPY WITH PROPOFOL N/A 12/25/2014   Procedure: COLONOSCOPY WITH PROPOFOL;  Surgeon: Wallace Cullens, MD;  Location: [facility];  Service: Gastroenterology;  Laterality: N/A;   COLONOSCOPY WITH PROPOFOL N/A 04/14/2019   Procedure: COLONOSCOPY WITH PROPOFOL;  Surgeon: Toledo, Boykin Nearing, MD;  Location: [facility];  Service: Gastroenterology;  Laterality: N/A;   ENDOSCOPIC RETROGRADE CHOLANGIOPANCREATOGRAPHY (ERCP) WITH PROPOFOL N/A 11/18/2015   Procedure: ENDOSCOPIC RETROGRADE CHOLANGIOPANCREATOGRAPHY (ERCP) WITH PROPOFOL;  Surgeon: Midge Minium, MD;  Location: [facility];  Service: Endoscopy;  Laterality: N/A;   ESOPHAGOGASTRODUODENOSCOPY (EGD) WITH PROPOFOL N/A 04/14/2019   Procedure: ESOPHAGOGASTRODUODENOSCOPY (  EGD) WITH PROPOFOL;  Surgeon: Toledo, Boykin Nearing, MD;  Location: [facility];  Service: Gastroenterology;  Laterality: N/A;   EYE SURGERY     Cataract Surgery    HERNIA REPAIR     Family History:  Family History  Problem Relation Age of Onset   Breast  cancer Maternal Aunt    High blood pressure Mother    Hyperlipidemia Mother    Diabetes Mellitus II Mother    Stroke Mother    Osteoporosis Mother    High blood pressure Father    Stroke Maternal Grandmother    Diabetes Mellitus II Maternal Grandmother    Stroke Maternal Grandfather    Diabetes Mellitus II Maternal Grandfather    Family Psychiatric  History: see previous Social History:  Social History   Substance and Sexual Activity  Alcohol Use No     Social History   Substance and Sexual Activity  Drug Use No    Social History   Socioeconomic History   Marital status: Single    Spouse name: Not on file   Number of children: Not on file   Years of education: Not on file   Highest education level: Not on file  Occupational History   Not on file  Tobacco Use   Smoking status: Former    Types: Cigarettes    Quit date: 06/20/1980    Years since quitting: 40.7   Smokeless tobacco: Never   Tobacco comments:    smoked socially in early 1980s (reports for 4 months)  Vaping Use   Vaping Use: Never used  Substance and Sexual Activity   Alcohol use: No   Drug use: No   Sexual activity: Not on file  Other Topics Concern   Not on file  Social History Narrative   Not on file   Social Determinants of Health   Financial Resource Strain: Not on file  Food Insecurity: Not on file  Transportation Needs: Not on file  Physical Activity: Not on file  Stress: Not on file  Social Connections: Not on file   Additional Social History:    Allergies:   Allergies  Allergen Reactions   Aminophylline Other (See Comments)    Headache   Cinnamon Other (See Comments)    Stomachache    Advil [Ibuprofen] Itching and Rash    Pt states she can take ibuprofen   Amoxicillin Rash   Latex Rash   Nsaids Rash   Penicillins Nausea And Vomiting   Potassium Nausea And Vomiting and Nausea Only   Potassium-Containing Compounds Nausea And Vomiting   Septra  [Sulfamethoxazole-Trimethoprim] Rash   Sulfa Antibiotics Rash   Sulfasalazine Rash   Tape Rash    Paper tape ok   Theophyllines Other (See Comments)    Headache     Labs:  Results for orders placed or performed during the hospital encounter of 03/17/21 (from the past 48 hour(s))  Urine Drug Screen, Qualitative ([facility] only)     Status: None   Collection Time: 03/19/21 12:09 AM  Result Value Ref Range   Tricyclic, Ur Screen NONE DETECTED NONE DETECTED   Amphetamines, Ur Screen NONE DETECTED NONE DETECTED   MDMA (Ecstasy)Ur Screen NONE DETECTED NONE DETECTED   Cocaine Metabolite,Ur [place] NONE DETECTED NONE DETECTED   Opiate, Ur Screen NONE DETECTED NONE DETECTED   Phencyclidine (PCP) Ur S NONE DETECTED NONE DETECTED   Cannabinoid 50 Ng, Ur [place] NONE DETECTED NONE DETECTED   Barbiturates, Ur Screen NONE DETECTED NONE DETECTED   Benzodiazepine,  Ur Scrn NONE DETECTED NONE DETECTED   Methadone Scn, Ur NONE DETECTED NONE DETECTED    Comment: (NOTE) Tricyclics + metabolites, urine    Cutoff 1000 ng/mL Amphetamines + metabolites, urine  Cutoff 1000 ng/mL MDMA (Ecstasy), urine              Cutoff 500 ng/mL Cocaine Metabolite, urine          Cutoff 300 ng/mL Opiate + metabolites, urine        Cutoff 300 ng/mL Phencyclidine (PCP), urine         Cutoff 25 ng/mL Cannabinoid, urine                 Cutoff 50 ng/mL Barbiturates + metabolites, urine  Cutoff 200 ng/mL Benzodiazepine, urine              Cutoff 200 ng/mL Methadone, urine                   Cutoff 300 ng/mL  The urine drug screen provides only a preliminary, unconfirmed analytical test result and should not be used for non-medical purposes. Clinical consideration and professional judgment should be applied to any positive drug screen result due to possible interfering substances. A more specific alternate chemical method must be used in order to obtain a confirmed analytical result. Gas chromatography / mass spectrometry (GC/MS) is the  preferred confirm atory method. Performed at [facility], [address] [place], [place] [address]     Current Facility-Administered Medications  Medication Dose Route Frequency Provider Last Rate Last Admin   acetaminophen (TYLENOL) tablet 325 mg  325 mg Oral Q6H PRN Willy Eddy, MD   325 mg at 03/20/21 1004   atorvastatin (LIPITOR) tablet 40 mg  40 mg Oral Daily Willy Eddy, MD   40 mg at 03/20/21 1004   famotidine (PEPCID) tablet 20 mg  20 mg Oral BID Willy Eddy, MD   20 mg at 03/20/21 1006   levothyroxine (SYNTHROID) tablet 112 mcg  112 mcg Oral Q0600 Willy Eddy, MD   112 mcg at 03/20/21 1005   magnesium oxide (MAG-OX) tablet 400 mg  400 mg Oral TID Willy Eddy, MD   400 mg at 03/20/21 1005   OLANZapine (ZYPREXA) tablet 10 mg  10 mg Oral QHS Gabriel Cirri F, NP       OLANZapine (ZYPREXA) tablet 2.5 mg  2.5 mg Oral Daily Charm Rings, NP   2.5 mg at 03/20/21 1005   Oxcarbazepine (TRILEPTAL) tablet 300 mg  300 mg Oral BID Charm Rings, NP   300 mg at 03/20/21 1006   pantoprazole (PROTONIX) EC tablet 40 mg  40 mg Oral Daily Willy Eddy, MD   40 mg at 03/20/21 1005   Current Outpatient Medications  Medication Sig Dispense Refill   acetaminophen (TYLENOL) 325 MG tablet Take 325 mg by mouth every 6 (six) hours as needed.     Albuterol Sulfate 108 (90 Base) MCG/ACT AEPB Inhale 2 puffs into the lungs every 6 (six) hours as needed.     atorvastatin (LIPITOR) 40 MG tablet Take 40 mg by mouth daily.     betamethasone valerate (VALISONE) 0.1 % cream Apply topically 2 (two) times daily as needed.     calcium citrate-vitamin D (CITRACAL+D) 315-200 MG-UNIT per tablet Take 1 tablet by mouth 2 (two) times daily.     carbamazepine (TEGRETOL) 200 MG tablet Take 200 mg by mouth 3 (three) times daily.     Cholecalciferol 100 MCG (  4000 UT) TABS Take 1,000 Units by mouth.     esomeprazole (NEXIUM) 40 MG capsule Take 40 mg by mouth daily at 12 noon.      famotidine (PEPCID) 20 MG tablet Take 1 tablet by mouth 2 (two) times daily.     hydrocortisone (ANUSOL-HC) 25 MG suppository Place 25 mg rectally 2 (two) times daily as needed for hemorrhoids or anal itching.     hydroxypropyl methylcellulose / hypromellose (ISOPTO TEARS / GONIOVISC) 2.5 % ophthalmic solution 1 drop as needed for dry eyes.     levothyroxine (SYNTHROID) 112 MCG tablet Take 112 mcg by mouth every morning.     magnesium oxide (MAG-OX) 400 (240 Mg) MG tablet Take 1 tablet by mouth 3 (three) times daily.     naproxen (NAPROSYN) 375 MG tablet Take 375 mg by mouth 2 (two) times daily with a meal.     OLANZapine (ZYPREXA) 7.5 MG tablet Take 7.5 mg by mouth daily.     PARoxetine (PAXIL) 20 MG tablet Take 20 mg by mouth daily.     potassium chloride 20 MEQ/15ML (10%) SOLN Take 10 mEq by mouth daily.     vitamin E 400 UNIT capsule Take 400 Units by mouth daily.     alendronate (FOSAMAX) 70 MG tablet Take 70 mg by mouth once a week. Take with a full glass of water on an empty stomach.     ALPRAZolam (XANAX) 0.25 MG tablet Take 0.25 mg by mouth as needed for anxiety. Before pap smears     Dexlansoprazole 30 MG capsule Take 30 mg by mouth daily. (Patient not taking: Reported on 03/18/2021)     fexofenadine-pseudoephedrine (ALLEGRA-D 24) 180-240 MG 24 hr tablet Take 1 tablet by mouth daily. (Patient not taking: Reported on 01/09/2021)     levothyroxine (SYNTHROID, LEVOTHROID) 125 MCG tablet Take 125 mcg by mouth daily before breakfast. (Patient not taking: Reported on 03/18/2021)     metFORMIN (GLUCOPHAGE-XR) 750 MG 24 hr tablet Take 750 mg by mouth 2 (two) times daily at 10 AM and 5 PM. (Patient not taking: Reported on 07/05/2020)     SUMAtriptan (IMITREX) 50 MG tablet Take 50 mg by mouth every 2 (two) hours as needed for migraine. May repeat in 2 hours if headache persists or recurs.     Wheat Dextrin (BENEFIBER PO) Take by mouth. (Patient not taking: Reported on 07/05/2020)       Musculoskeletal: Strength & Muscle Tone: decreased Gait & Station:  not ambulatory Patient leans: N/A  Psychiatric Specialty Exam:  Presentation  General Appearance: Appropriate for Environment  Eye Contact:Good  Speech:Clear and Coherent  Speech Volume:Normal  Handedness:No data recorded  Mood and Affect  Mood:Euthymic  Affect:Appropriate   Thought Process  Thought Processes:Disorganized  Descriptions of Associations:Loose  Orientation:Partial  Thought Content:Scattered; Tangential  History of Schizophrenia/Schizoaffective disorder:No  Duration of Psychotic Symptoms:Less than six months  Hallucinations:No data recorded Ideas of Reference:Delusions  Suicidal Thoughts:No data recorded Homicidal Thoughts:No data recorded  Sensorium  Memory:Immediate Poor  Judgment:Impaired  Insight:Lacking   Executive Functions  Concentration:Fair  Attention Span:Fair  Recall:Fair  Fund of Knowledge:Fair  Language:Fair   Psychomotor Activity  Psychomotor Activity:No data recorded  Assets  Assets:Resilience; Financial Resources/Insurance; Physical Health   Sleep  Sleep:No data recorded  Physical Exam: Physical Exam Vitals and nursing note reviewed.  HENT:     Head: Normocephalic.     Nose: No congestion or rhinorrhea.  Eyes:     General:  Right eye: No discharge.        Left eye: No discharge.  Cardiovascular:     Rate and Rhythm: Normal rate.  Pulmonary:     Effort: Pulmonary effort is normal.  Musculoskeletal:     Cervical back: Normal range of motion.     Comments: Cerebral palsy/wheelchair bound   Skin:    General: Skin is dry.  Neurological:     Mental Status: She is alert.  Psychiatric:        Mood and Affect: Mood normal.        Speech: Speech normal.        Behavior: Behavior is cooperative.        Thought Content: Thought content is delusional.        Cognition and Memory: Cognition is impaired.   ROS Blood  pressure 139/72, pulse 80, temperature 99.3 F (37.4 C), temperature source Oral, resp. rate 15, height 5\' 4"  (1.626 m), weight 68 kg, SpO2 98 %. Body mass index is 25.75 kg/m.  Treatment Plan Summary: Continue to follow patient for evaluation of symptoms, progress in treatment, and medication management. Increased Zyprexa to 10 mg at bedtime. Reviewed with EDP and , NP.   Disposition: Patient's acceptance to inpatient psychiatry has been limited by her inability to ambulate/wheelchair bound.  Patient is calm and cooperative and unless there is an acute decline overnight, patient can be returned home tomorrow (03/21/21).   We have made medication adjustments. Patient has had hallucinations for 7 months. Her father will follow up with her primary care provider about her status and recommended that father speak with patient's primary about SNF if he feels he is unable to care for her at home for much longer. Father states that he will pick patient up in the morning.   03/23/21, NP 03/20/2021 2:14 PM

## 2021-03-20 NOTE — ED Provider Notes (Signed)
Emergency Medicine Observation Re-evaluation Note  Michele James is a 65 y.o. female, seen on rounds today.  Pt initially presented to the ED for complaints of Psychiatric Evaluation Currently, the patient is resting, voices no medical complaint.  Physical Exam  BP (!) 141/68 (BP Location: Right Arm)    Pulse 92    Temp 99.5 F (37.5 C) (Oral)    Resp 18    Ht 5\' 4"  (1.626 m)    Wt 68 kg    SpO2 98%    BMI 25.75 kg/m  Physical Exam General: Resting in no acute distress Cardiac: No cyanosis Lungs: Equal rise and fall Psych: Not agitated  ED Course / MDM  EKG:   I have reviewed the labs performed to date as well as medications administered while in observation.  Recent changes in the last 24 hours include no events overnight.  Plan  Current plan is for [facility] psych disposition.  Michele James is not under involuntary commitment.     [name], MD 03/20/21 [phone]

## 2021-03-20 NOTE — ED Notes (Signed)
Pt's briefs, linens, and purewick changed out after peri care. Pt was wet with urine as cord from purewick canister had been pulled loose. Stretcher locked low. Rails up. Pt repositioned. Pt calm and cooperative.

## 2021-03-20 NOTE — ED Notes (Signed)
VOL  PENDING  PLACEMENT 

## 2021-03-20 NOTE — ED Notes (Signed)
Meal given to pt.

## 2021-03-20 NOTE — BH Assessment (Signed)
Writer spoke to patient's father [phone]. Father states patient began hallucinating about 7 months ago and they have worsened over the past couple of weeks. Dad states he is not sure what to do and believes the change may have been because of a modification in patient's psych meds. Dad reports patient baseline is having zero hallucinations.

## 2021-03-20 NOTE — ED Notes (Signed)
Pt given snack. 

## 2021-03-20 NOTE — ED Notes (Signed)
Pt bedding changed as it as damp around brief area.  New purewick place and new brief and chuck in place.  RN placed pressure bandage on pt as pt complained of soreness to bottom area.  Pt expressed she wanted to take a shower tonight. Tech told pt that we do showers inthemorning and that she would tell the staff in the morning about her desire. Pt said that she does not like to have a bed bath as she does not feel clean when this happens. Pt is usually in a wheelchair and said that she would like to be in a wheelchair in the shower. Tech told pt that the possibility of going to the deacon shower in a wheelchair is a possibility and again will share with day shift.  New scrub top placed on pt after she took hers off.

## 2021-03-21 DIAGNOSIS — R443 Hallucinations, unspecified: Secondary | ICD-10-CM | POA: Diagnosis not present

## 2021-03-21 MED ORDER — OLANZAPINE 2.5 MG PO TABS: 2.5000 mg | ORAL_TABLET | Freq: Every day | ORAL | 9 refills | Status: DC

## 2021-03-21 MED ORDER — OLANZAPINE 10 MG PO TABS: 10.0000 mg | ORAL_TABLET | Freq: Every day | ORAL | 0 refills | Status: DC

## 2021-03-21 NOTE — ED Notes (Signed)
VOL/Pending Discharge 

## 2021-03-21 NOTE — ED Provider Notes (Signed)
°  Patient cleared by our psychiatry team.  Apparently has had chronic hallucinations without emergent or acute concerns.  I discussed management with psychiatric NP and patient's father at home.  She typically resides with her father.  He expresses some concern that she probably needs to be in a nursing home and had just darted reaching out to her PCP yesterday, Dr. Terance Hart, but has not heard back yet.  He asks if we can get her placed to a nursing home, and I tell him that this is quite difficult to do from the ER.  Ultimately he is agreeable to take the patient back home if we provide EMS transport assistance.  We discussed medication changes here from the ER, management at home, following up with her PCP and return precautions for the ED.  He expresses understanding and agreement.  We will discharge via EMS back home   Delton Prairie, MD 03/21/21 1102

## 2021-03-21 NOTE — ED Notes (Signed)
Pt soaked in urine and cleansed by this Clinical research associate and Amy, RN- clean linens, clean briefs, and new purewick in place. No other needs voiced at this time.

## 2021-03-21 NOTE — Discharge Instructions (Signed)
We increased the dose of her nighttime olanzapine/Zyprexa to 10 mg.  Still taking 2.5 mg once daily in the morning of this same olanzapine/Zyprexa.    All other medications are the same.  Please follow-up with Dr. Terance Hart to discuss getting her placed to a nursing home.  Return to the ED with other acute concerns

## 2021-03-21 NOTE — ED Provider Notes (Signed)
Emergency Medicine Observation Re-evaluation Note  Michele James is a 65 y.o. female, seen on rounds today.  Pt initially presented to the ED for complaints of Psychiatric Evaluation Currently, the patient is resting without complaints.  Physical Exam  BP 133/76 (BP Location: Right Arm)    Pulse 89    Temp 98.7 F (37.1 C) (Oral)    Resp 20    Ht 5\' 4"  (1.626 m)    Wt 68 kg    SpO2 97%    BMI 25.75 kg/m  Physical Exam Gen: No acute distress  Resp: Normal rise and fall of chest Neuro: Moving all four extremities Psych: Resting currently, calm and cooperative when awake   ED Course / MDM  EKG:   I have reviewed the labs performed to date as well as medications administered while in observation.  Recent changes in the last 24 hours include no acute events overnight.  Plan  Current plan is for [facility] psych placement.  Michele James is not under involuntary commitment.     Derell Bruun, [name], DO 03/21/21 [phone]

## 2021-05-01 DIAGNOSIS — I69820 Aphasia following other cerebrovascular disease: Secondary | ICD-10-CM | POA: Insufficient documentation

## 2021-05-01 DIAGNOSIS — I69811 Memory deficit following other cerebrovascular disease: Secondary | ICD-10-CM | POA: Insufficient documentation

## 2021-10-31 NOTE — Progress Notes (Signed)
Psychiatric Initial Adult Assessment   Patient Identification: Michele James MRN:  [id number] Date of Evaluation:  11/04/2021 Referral Source: Dorothey Baseman, MD   Chief Complaint:   Chief Complaint  Patient presents with   Establish Care   Visit Diagnosis:    ICD-10-CM   1. MDD (major depressive disorder), recurrent, in partial remission (HCC)  F33.41       History of Present Illness:   Michele James is a 66 y.o. year old female with a history of depression, anxiety, seizure disorder, cerebral palsy, GERD, osteopoross, who is referred for depression.   She is accompanied by her father.  She states that she used to be seen by Dr. Johny Drilling and Dr. Marland KitchenA."  However, this doctor "A" disappears and she has not been able to get her medication.  She was seen at [facility], who prescribed her the medication.  She states that she had a concern of becoming pregnant when she sit on the knee of her father while the nurse was taking care of the wet bet.  She tries to show her abdomen, stating that one side is bigger than the other.  She has AH of some kind of sound.  When she was asked VH, she states that she has screening window.  When she was asked to elaborate it, her father answers that she mentioned seeing flowers crawling up the wall. She states that "I pretended (to have VH)," although she "don't know" what she did it.  She states that she has paranoia as people do not want her to "pop" her fingers.  When she was asked if she feels safe, she states that "sometimes I think."  She later states that she has concern of bugs at home.  During the visit, she interrupts frequently, talking about tetanus shot, Xanax to be taken during the pap smear, and explaining about some medication in the handout, although these were not relevant to the topics.  When she was asked if she is interested in trying higher dose of olanzapine, she answers "I don't think I have a covid shot."  She feels down at times.  She has good  sleep.  She denies appetite loss.  She denies SI, HI.   Her father presents to the interview.  He states that he struggles with some memory issues.  She was admitted in December (according to her, she "almost died"), which she claimed as accidental potassium overdose.  He thinks it was due to other medication, although it was never intentional.  He puts her medication in a pill box. She has been taking medication consistently. She has been sitting in the chair, staring at the wall.  He thinks she is worried about his condition.  He denies any safety concern at home.    Support: parents, RN who comes twice a week Household: father, aunt ([phone]) Marital status:single Number of children:0  Employment: unemployed (used to be employed at [employer] in 2021, doing Diplomatic Services operational officer work) Education:  12 th grade Last PCP / ongoing medical evaluation:    Associated Signs/Symptoms: Depression Symptoms:  depressed mood, (Hypo) Manic Symptoms:   denies decreased need for sleep, euphoria Anxiety Symptoms:   denies anxiety  Psychotic Symptoms:  Hallucinations: Auditory Visual Paranoia, PTSD Symptoms: Negative  Past Psychiatric History:  Outpatient:  Psychiatry admission: a few times, several years ago (66 yo, and high school and 49-32 yo) for depression Previous suicide attempt:  denies  Past trials of medication:  History of violence:  Previous Psychotropic Medications: Yes   Substance Abuse History in the last 12 months:  No.  Consequences of Substance Abuse: NA  Past Medical History:  Past Medical History:  Diagnosis Date   Anemia    Arthritis    knees   Cerebral palsy (HCC)    Depression    Diabetes mellitus without complication (HCC)    Diverticulosis    Dysphagia    Eczema    GERD (gastroesophageal reflux disease)    Glaucoma    Hemorrhoids    Hyperlipidemia    Hypothyroidism    Seizures (HCC)    none in over 10 yrs    Past Surgical History:  Procedure Laterality Date   BREAST  BIOPSY Right 03/22/2020   stereo bx, ribbon clip, path pending    CATARACT EXTRACTION W/PHACO Right 02/06/2016   Procedure: CATARACT EXTRACTION PHACO AND INTRAOCULAR LENS PLACEMENT (IOC);  Surgeon: Lockie Mola, MD;  Location: [facility];  Service: Ophthalmology;  Laterality: Right;   CATARACT EXTRACTION W/PHACO Left 03/05/2016   Procedure: CATARACT EXTRACTION PHACO AND INTRAOCULAR LENS PLACEMENT (IOC);  Surgeon: Lockie Mola, MD;  Location: [facility];  Service: Ophthalmology;  Laterality: Left;   CHOLECYSTECTOMY N/A 11/16/2015   Procedure: LAPAROSCOPIC CHOLECYSTECTOMY;  Surgeon: Tiney Rouge III, MD;  Location: [facility] ORS;  Service: General;  Laterality: N/A;  attempted cholangiogram   COLONOSCOPY WITH PROPOFOL N/A 12/25/2014   Procedure: COLONOSCOPY WITH PROPOFOL;  Surgeon: Wallace Cullens, MD;  Location: [facility];  Service: Gastroenterology;  Laterality: N/A;   COLONOSCOPY WITH PROPOFOL N/A 04/14/2019   Procedure: COLONOSCOPY WITH PROPOFOL;  Surgeon: Toledo, Boykin Nearing, MD;  Location: [facility] END[facility]COPY;  Service: Gastroenterology;  Laterality: N/A;   ENDOSCOPIC RETROGRADE CHOLANGIOPANCREATOGRAPHY (ERCP) WITH PROPOFOL N/A 11/18/2015   Procedure: ENDOSCOPIC RETROGRADE CHOLANGIOPANCREATOGRAPHY (ERCP) WITH PROPOFOL;  Surgeon: Midge Minium, MD;  Location: [facility] ENDOSCOPY;  Service: Endoscopy;  Laterality: N/A;   ESOPHAGOGASTRODUODENOSCOPY (EGD) WITH PROPOFOL N/A 04/14/2019   Procedure: ESOPHAGOGASTRODUODENOSCOPY (EGD) WITH PROPOFOL;  Surgeon: Toledo, Boykin Nearing, MD;  Location: [facility] ENDOSCOPY;  Service: Gastroenterology;  Laterality: N/A;   EYE SURGERY     Cataract Surgery    HERNIA REPAIR      Family Psychiatric History: denies   Family History:  Family History  Problem Relation Age of Onset   High blood pressure Mother    Hyperlipidemia Mother    Diabetes Mellitus II Mother    Stroke Mother    Osteoporosis Mother    High blood pressure Father    Breast cancer Maternal  Aunt    Stroke Maternal Grandfather    Diabetes Mellitus II Maternal Grandfather    Stroke Maternal Grandmother    Diabetes Mellitus II Maternal Grandmother     Social History:   Social History   Socioeconomic History   Marital status: Single    Spouse name: Not on file   Number of children: Not on file   Years of education: Not on file   Highest education level: Not on file  Occupational History   Not on file  Tobacco Use   Smoking status: Former    Types: Cigarettes    Quit date: 06/20/1980    Years since quitting: 41.4   Smokeless tobacco: Never   Tobacco comments:    smoked socially in early 1980s (reports for 4 months)  Vaping Use   Vaping Use: Never used  Substance and Sexual Activity   Alcohol use: No   Drug use: No   Sexual activity: Not Currently  Other Topics Concern   Not on file  Social History Narrative   Not on file   Social Determinants of Health   Financial Resource Strain: Not on file  Food Insecurity: Not on file  Transportation Needs: Not on file  Physical Activity: Not on file  Stress: Not on file  Social Connections: Not on file    Additional Social History: as above  Allergies:   Allergies  Allergen Reactions   Aminophylline Other (See Comments)    Headache   Cinnamon Other (See Comments)    Stomachache    Advil [Ibuprofen] Itching and Rash    Pt states she can take ibuprofen   Amoxicillin Rash   Latex Rash   Nsaids Rash   Penicillins Nausea And Vomiting   Potassium Nausea And Vomiting and Nausea Only   Potassium-Containing Compounds Nausea And Vomiting   Septra [Sulfamethoxazole-Trimethoprim] Rash   Sulfa Antibiotics Rash   Sulfasalazine Rash   Tape Rash    Paper tape ok   Theophyllines Other (See Comments)    Headache     Metabolic Disorder Labs: No results found for: "HGBA1C", "MPG" No results found for: "PROLACTIN" No results found for: "CHOL", "TRIG", "HDL", "CHOLHDL", "VLDL", "LDLCALC" Lab Results  Component  Value Date   TSH 0.686 03/17/2021    Therapeutic Level Labs: No results found for: "LITHIUM" No results found for: "CBMZ" No results found for: "VALPROATE"  Current Medications: Current Outpatient Medications  Medication Sig Dispense Refill   acetaminophen (TYLENOL) 325 MG tablet Take 325 mg by mouth every 6 (six) hours as needed.     alendronate (FOSAMAX) 70 MG tablet Take 70 mg by mouth once a week. Take with a full glass of water on an empty stomach.     ALPRAZolam (XANAX) 0.25 MG tablet Take 0.25 mg by mouth as needed for anxiety. Before pap smears     atorvastatin (LIPITOR) 40 MG tablet Take 40 mg by mouth daily.     calcium citrate-vitamin D (CITRACAL+D) 315-200 MG-UNIT per tablet Take 1 tablet by mouth 2 (two) times daily.     carbamazepine (TEGRETOL) 200 MG tablet Take 200 mg by mouth 3 (three) times daily.     Cholecalciferol 100 MCG (4000 UT) TABS Take 1,000 Units by mouth.     esomeprazole (NEXIUM) 40 MG capsule Take 40 mg by mouth daily at 12 noon.     famotidine (PEPCID) 20 MG tablet Take 1 tablet by mouth 2 (two) times daily.     hydroxypropyl methylcellulose / hypromellose (ISOPTO TEARS / GONIOVISC) 2.5 % ophthalmic solution 1 drop as needed for dry eyes.     levothyroxine (SYNTHROID) 112 MCG tablet Take 112 mcg by mouth every morning.     magnesium oxide (MAG-OX) 400 (240 Mg) MG tablet Take 1 tablet by mouth 3 (three) times daily.     naproxen (NAPROSYN) 375 MG tablet Take 375 mg by mouth 2 (two) times daily with a meal.     OLANZapine (ZYPREXA) 10 MG tablet Take 1 tablet (10 mg total) by mouth at bedtime. 30 tablet 0   OLANZapine (ZYPREXA) 2.5 MG tablet Take 1 tablet (2.5 mg total) by mouth daily. 30 tablet 9   PARoxetine (PAXIL) 20 MG tablet Take 20 mg by mouth daily.     PARoxetine (PAXIL) 20 MG tablet Take 1 tablet (20 mg total) by mouth daily. 30 tablet 1   potassium chloride 20 MEQ/15ML (10%) SOLN Take 10 mEq by mouth daily.     vitamin  E 400 UNIT capsule Take 400  Units by mouth daily.     Wheat Dextrin (BENEFIBER PO) Take by mouth.     No current facility-administered medications for this visit.    Musculoskeletal: Strength & Muscle Tone: within normal limits Gait & Station:  in a wheel chair Patient leans: N/A  Psychiatric Specialty Exam: Review of Systems  Psychiatric/Behavioral:  Positive for dysphoric mood. Negative for agitation, behavioral problems, confusion, decreased concentration, hallucinations, self-injury, sleep disturbance and suicidal ideas. The patient is not nervous/anxious and is not hyperactive.   All other systems reviewed and are negative.   Blood pressure 113/67, pulse 98, temperature 97.8 F (36.6 C), temperature source Temporal.There is no height or weight on file to calculate BMI.  General Appearance: Fairly Groomed  Eye Contact:  Fair  Speech:  Clear and Coherent  Volume:  Normal  Mood:   fine  Affect:  Appropriate, Congruent, and Constricted  Thought Process:  Disorganized and Irrelevant  Orientation:  Full (Time, Place, and Person)  Thought Content:  Logical  Suicidal Thoughts:  No  Homicidal Thoughts:  No  Memory:  Immediate;   Good  Judgement:  Fair  Insight:  Shallow  Psychomotor Activity:  Normal  Concentration:  Concentration: Fair and Attention Span: Fair  Recall:  Good  Fund of Knowledge:Good  Language: Good  Akathisia:  No  Handed:  Right  AIMS (if indicated):  not done  Assets:  Communication Skills Desire for Improvement  ADL's:  Intact  Cognition: WNL  Sleep:  Good   Screenings: PHQ2-9    Flowsheet Row [facility]  PHQ-2 Total Score 0      Flowsheet Row [facility]  C-SSRS RISK CATEGORY No Risk Error: Question 6 not populated       Assessment and Plan:  Michele James is a 66 y.o.  year old female with a history of depression, anxiety, seizure disorder, cerebral palsy, GERD, osteopoross, who is referred for depression.    # Hallucinations, paranoia # r/o schizophrenia Exam is notable for tangential thought process, and she frequently interrupts the interview, asking questions which are irrelevant to the topic. She has occasional hallucinations and paranoia.  She has no known developmental disorder or intellectual disability.  Collateral is limited due to her elderly father reporting struggling with memory loss, and it is unclear as to how long she experiences these symptom, although there is a note indicating that she had "possibly schizophrenia."  Although it was discussed to try higher dose of olanzapine to target this along with hallucinations and paranoia, she reports strong preference to stay on the current medication regimen.  Discussed potential metabolic side effect and EPS.   1. MDD (major depressive disorder), recurrent, in partial remission (HCC) Although she reports occasional depressed mood due to hallucinations, she denies any other significant mood symptoms otherwise.  Will continue current dose of Paxil to target depression.   Plan Continue olanzapine 2.5 mg in the a.m., 10 mg at night Continue Paxil 20 mg daily Next appointment: 9/25 at 3 PM for 30 mins, in person  The patient demonstrates the following risk factors for suicide: Chronic risk factors for suicide include: psychiatric disorder of depression . Acute risk factors for suicide include: unemployment. Protective factors for this patient include: positive social support and hope for the future. Considering these factors, the overall suicide  risk at this point appears to be low. Patient is appropriate for outpatient follow up.     Collaboration of Care: Other reviewed her chart  Patient/Guardian was advised Release of Information must be obtained prior to any record release in order to collaborate  their care with an outside provider. Patient/Guardian was advised if they have not already done so to contact the registration department to sign all necessary forms in order for [place] to release information regarding their care.   Consent: Patient/Guardian gives verbal consent for treatment and assignment of benefits for services provided during this visit. Patient/Guardian expressed understanding and agreed to proceed.   Neysa Hotter, MD 8/7/20233:10 PM

## 2021-11-04 ENCOUNTER — Ambulatory Visit (INDEPENDENT_AMBULATORY_CARE_PROVIDER_SITE_OTHER): Payer: Medicare Other | Admitting: Psychiatry

## 2021-11-04 ENCOUNTER — Encounter: Payer: Self-pay | Admitting: Psychiatry

## 2021-11-04 VITALS — BP 113/67 | HR 98 | Temp 97.8°F

## 2021-11-04 DIAGNOSIS — F3341 Major depressive disorder, recurrent, in partial remission: Secondary | ICD-10-CM

## 2021-11-04 MED ORDER — PAROXETINE HCL 20 MG PO TABS: 20.0000 mg | ORAL_TABLET | Freq: Every day | ORAL | 1 refills | Status: DC

## 2021-12-19 NOTE — Progress Notes (Addendum)
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  She states that there is a screen at home, and she is concerned that people outside might be able to hear her.  She also states that she has burning sensation in her groin area.  She asks if something to be done.  Although she was redirected at least a few times to contact her provider, she reports this concern.  She is concerned that something is going on with her body.  She also states that she hears children's voice.  Whenever she pops her finger, she hears voices of children telling her that they will kill her.  She feels unsafe to be outside due to this.  She is also concerned about the house not being clean.  There is a mold on her dining table.  She asks if she and her parents can be given the card with the number of the office (she asks this a few times). She denies feeling depressed or anxiety.  Both of her parents present to the interview.  Although she acts like she is more alive, they do not think she is back to herself..  She used to be able to read and explore things currently in 2020.  They do not know what happened around the time.  They are not aware of any concern about the screen.  She tends to hear wind noises, although they do not hear it. There is also a periods of "trance." She appears to be frozen, and does not know what is going on. She tends to be concerned about their health (her mother later states that she had colon cancer last year, and she had surgery). She eats and sleeps well. No aggression or safety concern.   Clock drawing- 3/3. Oriented to self, time, situation.   Wt Readings from Last 3 Encounters:  12/23/21 126 lb (57.2 kg)  03/17/21 150 lb (68 kg)  07/05/20 154 lb 6.4 oz (70 kg)    Visit Diagnosis:    ICD-10-CM   1.  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 MDD (major depressive disorder), recurrent, in partial remission (HCC)  F33.41       Past Psychiatric History: Please see initial evaluation for full details. I have reviewed the history. No updates at this time.     Past Medical History:  Past Medical History:  Diagnosis Date   Anemia    Arthritis    knees   Cerebral palsy (HCC)    Depression    Diabetes mellitus without complication (HCC)    Diverticulosis    Dysphagia    Eczema    GERD (gastroesophageal reflux disease)    Glaucoma    Hemorrhoids    Hyperlipidemia    Hypothyroidism    Seizures (HCC)    none in over 10 yrs    Past Surgical History:  Procedure Laterality Date   BREAST BIOPSY Right 03/22/2020   stereo bx, ribbon clip, path pending    CATARACT EXTRACTION W/PHACO Right 02/06/2016   Procedure: CATARACT EXTRACTION PHACO AND INTRAOCULAR LENS PLACEMENT (IOC);  Surgeon: Lockie Mola, MD;  Location: [facility];  Service: Ophthalmology;  Laterality: Right;   CATARACT EXTRACTION W/PHACO Left 03/05/2016   Procedure: CATARACT EXTRACTION PHACO AND INTRAOCULAR LENS PLACEMENT (IOC);  Surgeon:  Lockie Mola, MD;  Location: [facility];  Service: Ophthalmology;  Laterality: Left;   CHOLECYSTECTOMY N/A 11/16/2015   Procedure: LAPAROSCOPIC CHOLECYSTECTOMY;  Surgeon: Tiney Rouge III, MD;  Location: [facility];  Service: General;  Laterality: N/A;  attempted cholangiogram   COLONOSCOPY WITH PROPOFOL N/A 12/25/2014   Procedure: COLONOSCOPY WITH PROPOFOL;  Surgeon: Wallace Cullens, MD;  Location: [facility];  Service: Gastroenterology;  Laterality: N/A;   COLONOSCOPY WITH PROPOFOL N/A 04/14/2019   Procedure: COLONOSCOPY WITH PROPOFOL;  Surgeon: Toledo, Boykin Nearing, MD;  Location: [facility] ENDOSCOPY;  Service: Gastroenterology;  Laterality: N/A;   ENDOSCOPIC RETROGRADE CHOLANGIOPANCREATOGRAPHY (ERCP) WITH PROPOFOL N/A 11/18/2015   Procedure: ENDOSCOPIC RETROGRADE  CHOLANGIOPANCREATOGRAPHY (ERCP) WITH PROPOFOL;  Surgeon: Midge Minium, MD;  Location: [facility] END[facility]COPY;  Service: Endoscopy;  Laterality: N/A;   ESOPHAGOGASTRODUODENOSCOPY (EGD) WITH PROPOFOL N/A 04/14/2019   Procedure: ESOPHAGOGASTRODUODENOSCOPY (EGD) WITH PROPOFOL;  Surgeon: Toledo, Boykin Nearing, MD;  Location: [facility] ENDOSCOPY;  Service: Gastroenterology;  Laterality: N/A;   EYE SURGERY     Cataract Surgery    HERNIA REPAIR      Family Psychiatric History: Please see initial evaluation for full details. I have reviewed the history. No updates at this time.     Family History:  Family History  Problem Relation Age of Onset   High blood pressure Mother    Hyperlipidemia Mother    Diabetes Mellitus II Mother    Stroke Mother    Osteoporosis Mother    High blood pressure Father    Breast cancer Maternal Aunt    Stroke Maternal Grandfather    Diabetes Mellitus II Maternal Grandfather    Stroke Maternal Grandmother    Diabetes Mellitus II Maternal Grandmother     Social History:  Social History   Socioeconomic History   Marital status: Single    Spouse name: Not on file   Number of children: Not on file   Years of education: Not on file   Highest education level: Not on file  Occupational History   Not on file  Tobacco Use   Smoking status: Former    Types: Cigarettes    Quit date: 06/20/1980    Years since quitting: 41.5   Smokeless tobacco: Never   Tobacco comments:    smoked socially in early 1980s (reports for 4 months)  Vaping Use   Vaping Use: Never used  Substance and Sexual Activity   Alcohol use: No   Drug use: No   Sexual activity: Not Currently  Other Topics Concern   Not on file  Social History Narrative   Not on file   Social Determinants of Health   Financial Resource Strain: Not on file  Food Insecurity: Not on file  Transportation Needs: Not on file  Physical Activity: Not on file  Stress: Not on file  Social Connections: Not on file    Allergies:   Allergies  Allergen Reactions   Aminophylline Other (See Comments)    Headache   Cinnamon Other (See Comments)    Stomachache    Advil [Ibuprofen] Itching and Rash    Pt states she can take ibuprofen   Amoxicillin Rash   Latex Rash   Nsaids Rash   Penicillins Nausea And Vomiting   Potassium Nausea And Vomiting and Nausea Only   Potassium-Containing Compounds Nausea And Vomiting   Septra [Sulfamethoxazole-Trimethoprim] Rash   Sulfa Antibiotics Rash   Sulfasalazine Rash   Tape Rash    Paper tape ok   Theophyllines Other (See Comments)  Headache     Metabolic Disorder Labs: No results found for: "HGBA1C", "MPG" No results found for: "PROLACTIN" No results found for: "CHOL", "TRIG", "HDL", "CHOLHDL", "VLDL", "LDLCALC" Lab Results  Component Value Date   TSH 0.686 03/17/2021   TSH 0.172 (L) 05/16/2020    Therapeutic Level Labs: No results found for: "LITHIUM" No results found for: "VALPROATE" No results found for: "CBMZ"  Current Medications: Current Outpatient Medications  Medication Sig Dispense Refill   acetaminophen (TYLENOL) 325 MG tablet Take 325 mg by mouth every 6 (six) hours as needed.     alendronate (FOSAMAX) 70 MG tablet Take 70 mg by mouth once a week. Take with a full glass of water on an empty stomach.     ALPRAZolam (XANAX) 0.25 MG tablet Take 0.25 mg by mouth as needed for anxiety. Before pap smears     atorvastatin (LIPITOR) 40 MG tablet Take 40 mg by mouth daily.     calcium citrate-vitamin D (CITRACAL+D) 315-200 MG-UNIT per tablet Take 1 tablet by mouth 2 (two) times daily.     carbamazepine (TEGRETOL) 200 MG tablet Take 200 mg by mouth 3 (three) times daily.     Cholecalciferol 100 MCG (4000 UT) TABS Take 1,000 Units by mouth.     esomeprazole (NEXIUM) 40 MG capsule Take 40 mg by mouth daily at 12 noon.     famotidine (PEPCID) 20 MG tablet Take 1 tablet by mouth 2 (two) times daily.     hydroxypropyl methylcellulose / hypromellose (ISOPTO TEARS /  GONIOVISC) 2.5 % ophthalmic solution 1 drop as needed for dry eyes.     levothyroxine (SYNTHROID) 112 MCG tablet Take 112 mcg by mouth every morning.     magnesium oxide (MAG-OX) 400 (240 Mg) MG tablet Take 1 tablet by mouth 3 (three) times daily.     naproxen (NAPROSYN) 375 MG tablet Take 375 mg by mouth 2 (two) times daily with a meal.     OLANZapine (ZYPREXA) 10 MG tablet Take 1 tablet (10 mg total) by mouth at bedtime. 30 tablet 0   OLANZapine (ZYPREXA) 5 MG tablet Take 1 tablet (5 mg total) by mouth at bedtime. 30 tablet 1   PARoxetine (PAXIL) 20 MG tablet Take 20 mg by mouth daily.     PARoxetine (PAXIL) 20 MG tablet Take 1 tablet (20 mg total) by mouth daily. 30 tablet 1   potassium chloride 20 MEQ/15ML (10%) SOLN Take 10 mEq by mouth daily.     vitamin E 400 UNIT capsule Take 400 Units by mouth daily.     Wheat Dextrin (BENEFIBER PO) Take by mouth.     No current facility-administered medications for this visit.     Musculoskeletal: Strength & Muscle Tone: decreased Gait & Station:  N/A (in a wheel chair) Patient leans: N/A  Psychiatric Specialty Exam: Review of Systems  Psychiatric/Behavioral:  Negative for agitation, behavioral problems, confusion, decreased concentration, dysphoric mood, hallucinations, self-injury, sleep disturbance and suicidal ideas. The patient is not nervous/anxious and is not hyperactive.   All other systems reviewed and are negative.   Blood pressure 114/71, pulse 87, temperature (!) 97.2 F (36.2 C), temperature source Temporal, weight 126 lb (57.2 kg).Body mass index is 21.63 kg/m.  General Appearance: Fairly Groomed  Eye Contact:  Good  Speech:  Clear and Coherent  Volume:  Normal  Mood:   good  Affect:  Appropriate, Congruent, and calm  Thought Process:  Disorganized  Orientation:  Full (Time, Place, and Person)  Thought Content:  Delusions and Hallucinations: Auditory Visual   Suicidal Thoughts:  No  Homicidal Thoughts:  No  Memory:   Immediate;   Good  Judgement:  Good  Insight:  Good  Psychomotor Activity:  Normal, no rigidity on right arm, no resting tremors. Unable to examine left arm due to deformity  Concentration:  Concentration: Good and Attention Span: Good  Recall:  Good  Fund of Knowledge: Good  Language: Good  Akathisia:  No  Handed:  Right  AIMS (if indicated): not done  Assets:  Communication Skills Desire for Improvement  ADL's:  Intact  Cognition: WNL  Sleep:  Good   Screenings: PHQ2-9    [facility] Visit from 11/04/2021 in [place]  PHQ-2 Total Score 0      [facility] from 03/17/2021 in [place] [facility] from 05/16/2020 in [place] No Risk Error: Question 6 not populated        Assessment and Plan:  SHOLONDA JOBST is a 66 y.o. year old female with a history of depression, anxiety, seizure disorder, cerebral palsy, GERD, osteoporosis, who presents for follow up appointment for below.     1. Schizophreniform disorder ([place]) 2. MDD (major depressive disorder), recurrent, in partial remission ([place]) R/o schizophrenia Exam is notable for tangential thought process, somatic preoccupation, and she has occasional hallucinations and paranoia since the last visit. She has no known developmental disorder or intellectual disability.  Collateral is limited due to her elderly father reporting struggling with memory loss, and it is unclear as to how long she experiences these symptom, although there is a note indicating that she had "possibly schizophrenia."  Patient, and her parents and her parents are open to try higher dose of olanzapine to optimize treatment for schizophrenia spectrum disorder.  Discussed potential metabolic side effect, drowsiness and EPS.  Will continue current dose of Paxil to target depression.    Plan Increase olanzapine 5 mg in  the a.m., 10 mg at night (has total of 90 days supply from 9/4) - uptitrated from 2.5 mg am, 10 mg qhs Continue Paxil 20 mg daily Next appointment: 11/6 at 2 PM for 30 mins, in person    The patient demonstrates the following risk factors for suicide: Chronic risk factors for suicide include: psychiatric disorder of depression . Acute risk factors for suicide include: unemployment. Protective factors for this patient include: positive social support and hope for the future. Considering these factors, the overall suicide risk at this point appears to be low. Patient is appropriate for outpatient follow up.         Collaboration of Care: Collaboration of Care: Other reviewed notes in Epic  Patient/Guardian was advised Release of Information must be obtained prior to any record release in order to collaborate their care with an outside provider. Patient/Guardian was advised if they have not already done so to contact the registration department to sign all necessary forms in order for [place] to release information regarding their care.   Consent: Patient/Guardian gives verbal consent for treatment and assignment of benefits for services provided during this visit. Patient/Guardian expressed understanding and agreed to proceed.    Norman Clay, MD 12/23/2021, 3:48 PM

## 2021-12-23 ENCOUNTER — Ambulatory Visit (INDEPENDENT_AMBULATORY_CARE_PROVIDER_SITE_OTHER): Payer: Medicare Other | Admitting: Psychiatry

## 2021-12-23 ENCOUNTER — Encounter: Payer: Self-pay | Admitting: Psychiatry

## 2021-12-23 VITALS — BP 114/71 | HR 87 | Temp 97.2°F | Wt 126.0 lb

## 2021-12-23 DIAGNOSIS — F2081 Schizophreniform disorder: Secondary | ICD-10-CM | POA: Diagnosis not present

## 2021-12-23 DIAGNOSIS — F3341 Major depressive disorder, recurrent, in partial remission: Secondary | ICD-10-CM | POA: Diagnosis not present

## 2021-12-23 MED ORDER — OLANZAPINE 5 MG PO TABS: 5.0000 mg | ORAL_TABLET | Freq: Every day | ORAL | 1 refills | Status: DC

## 2022-01-06 ENCOUNTER — Ambulatory Visit
Admission: RE | Admit: 2022-01-06 | Discharge: 2022-01-06 | Disposition: A | Discharge status: Home / Self Care | Payer: Medicare Other | Source: Ambulatory Visit | Attending: Oncology | Admitting: Oncology

## 2022-01-06 DIAGNOSIS — R911 Solitary pulmonary nodule: Secondary | ICD-10-CM | POA: Insufficient documentation

## 2022-01-13 ENCOUNTER — Inpatient Hospital Stay: Payer: Medicare Other | Attending: Oncology | Admitting: Oncology

## 2022-01-13 ENCOUNTER — Encounter: Payer: Self-pay | Admitting: Oncology

## 2022-01-13 VITALS — BP 119/58 | HR 82 | Temp 98.9°F | Wt 124.9 lb

## 2022-01-13 DIAGNOSIS — Z87891 Personal history of nicotine dependence: Secondary | ICD-10-CM | POA: Insufficient documentation

## 2022-01-13 DIAGNOSIS — R911 Solitary pulmonary nodule: Secondary | ICD-10-CM | POA: Insufficient documentation

## 2022-01-13 DIAGNOSIS — G809 Cerebral palsy, unspecified: Secondary | ICD-10-CM | POA: Diagnosis not present

## 2022-01-13 DIAGNOSIS — G40909 Epilepsy, unspecified, not intractable, without status epilepticus: Secondary | ICD-10-CM | POA: Insufficient documentation

## 2022-01-13 NOTE — Progress Notes (Signed)
Hematology/Oncology Progress note [phone] [phone]      Patient Care Team: Dorothey Baseman, MD as PCP - General (Family Medicine) Glory Buff, RN as Oncology Nurse Navigator  REFERRING PROVIDER: Dorothey Baseman, MD  CHIEF COMPLAINTS/REASON FOR VISIT:  lung nodule  HISTORY OF PRESENTING ILLNESS:   Michele James is a  66 y.o.  female with PMH listed below was seen in consultation at the request of  Dorothey Baseman, MD  for evaluation of lung nodule Patient has a history of seizure disorder/history of cerebral palsy Patient is a poor historian.  She was accompanied by her mother who has hearing deficiency and not able to provide much history.Marland Kitchen   History was mainly obtained from reviewing medical records.  06/14/2020, CT chest with contrast showed a lobulated nodule with mildly spiculated margin in the posterior right chest persist and is suspicious for bronchogenic neoplasm based on morphologic repeat years Nodule along the minor fissure in the right chest measures 6 mm, nonspecific.  Moderately large hiatal hernia 11 mm distal common bile duct, previously 8 to 9 mm.  No pancreatic duct dilatation is visualized.  Prominence of the common bile duct on the prior study in this patient's post cholecystectomy.  Aortic atherosclerosis.  Patient was referred to establish care with oncology for further evaluation of the lung nodule.  She denies any cough, shortness of breath, hemoptysis, patient is a former smoker, and quit in 1982.  She reports only smoked for 4 months and mostly on weekends.  Patient has contractures and physical deformities which severely limits her  mobility.  She lives at home with parents.   INTERVAL HISTORY Michele James is a 66 y.o. female who has above history reviewed by me today presents for follow up visit for management of lung nodule Patient follows with primary care provider. Is concerned that she is overdue for mammogram.  She  had bilateral screening and diagnostic mammogram of the left breast in 2021 and had a negative right breast biopsy at that time.  Patient denies any new breast concerns.   Review of Systems  Unable to perform ROS: Other  Constitutional:  Positive for appetite change. Negative for fatigue and unexpected weight change.  Respiratory:  Negative for cough and shortness of breath.   Cardiovascular:  Negative for chest pain.  Gastrointestinal:  Negative for abdominal pain.  Musculoskeletal:  Negative for back pain.  Hematological:  Does not bruise/bleed easily.  Psychiatric/Behavioral:  Negative for confusion.     MEDICAL HISTORY:  Past Medical History:  Diagnosis Date   Anemia    Arthritis    knees   Cerebral palsy (HCC)    Depression    Diabetes mellitus without complication (HCC)    Diverticulosis    Dysphagia    Eczema    GERD (gastroesophageal reflux disease)    Glaucoma    Hemorrhoids    Hyperlipidemia    Hypothyroidism    Seizures (HCC)    none in over 10 yrs    SURGICAL HISTORY: Past Surgical History:  Procedure Laterality Date   BREAST BIOPSY Right 03/22/2020   stereo bx, ribbon clip, path pending    CATARACT EXTRACTION W/PHACO Right 02/06/2016   Procedure: CATARACT EXTRACTION PHACO AND INTRAOCULAR LENS PLACEMENT (IOC);  Surgeon: Lockie Mola, MD;  Location: [facility];  Service: Ophthalmology;  Laterality: Right;   CATARACT EXTRACTION W/PHACO Left 03/05/2016   Procedure: CATARACT EXTRACTION PHACO AND INTRAOCULAR LENS PLACEMENT (IOC);  Surgeon: Lockie Mola, MD;  Location:  [place];  Service: Ophthalmology;  Laterality: Left;   CHOLECYSTECTOMY N/A 11/16/2015   Procedure: LAPAROSCOPIC CHOLECYSTECTOMY;  Surgeon: Dia Crawford III, MD;  Location: [facility];  Service: General;  Laterality: N/A;  attempted cholangiogram   COLONOSCOPY WITH PROPOFOL N/A 12/25/2014   Procedure: COLONOSCOPY WITH PROPOFOL;  Surgeon: Hulen Luster, MD;  Location: [facility]  ENDOSCOPY;  Service: Gastroenterology;  Laterality: N/A;   COLONOSCOPY WITH PROPOFOL N/A 04/14/2019   Procedure: COLONOSCOPY WITH PROPOFOL;  Surgeon: Toledo, Benay Pike, MD;  Location: [facility];  Service: Gastroenterology;  Laterality: N/A;   ENDOSCOPIC RETROGRADE CHOLANGIOPANCREATOGRAPHY (ERCP) WITH PROPOFOL N/A 11/18/2015   Procedure: ENDOSCOPIC RETROGRADE CHOLANGIOPANCREATOGRAPHY (ERCP) WITH PROPOFOL;  Surgeon: Lucilla Lame, MD;  Location: [facility];  Service: Endoscopy;  Laterality: N/A;   ESOPHAGOGASTRODUODENOSCOPY (EGD) WITH PROPOFOL N/A 04/14/2019   Procedure: ESOPHAGOGASTRODUODENOSCOPY (EGD) WITH PROPOFOL;  Surgeon: Toledo, Benay Pike, MD;  Location: [facility];  Service: Gastroenterology;  Laterality: N/A;   EYE SURGERY     Cataract Surgery    HERNIA REPAIR      SOCIAL HISTORY: Social History   Socioeconomic History   Marital status: Single    Spouse name: Not on file   Number of children: Not on file   Years of education: Not on file   Highest education level: Not on file  Occupational History   Not on file  Tobacco Use   Smoking status: Former    Types: Cigarettes    Quit date: 06/20/1980    Years since quitting: 41.5   Smokeless tobacco: Never   Tobacco comments:    smoked socially in early 1980s (reports for 4 months)  Vaping Use   Vaping Use: Never used  Substance and Sexual Activity   Alcohol use: No   Drug use: No   Sexual activity: Not Currently  Other Topics Concern   Not on file  Social History Narrative   Not on file   Social Determinants of Health   Financial Resource Strain: Not on file  Food Insecurity: Not on file  Transportation Needs: Not on file  Physical Activity: Not on file  Stress: Not on file  Social Connections: Not on file  Intimate Partner Violence: Not on file    FAMILY HISTORY: Family History  Problem Relation Age of Onset   High blood pressure Mother    Hyperlipidemia Mother    Diabetes Mellitus II Mother     Stroke Mother    Osteoporosis Mother    High blood pressure Father    Breast cancer Maternal Aunt    Stroke Maternal Grandfather    Diabetes Mellitus II Maternal Grandfather    Stroke Maternal Grandmother    Diabetes Mellitus II Maternal Grandmother     ALLERGIES:  is allergic to aminophylline, cinnamon, advil [ibuprofen], amoxicillin, latex, nsaids, penicillins, potassium, potassium-containing compounds, septra [sulfamethoxazole-trimethoprim], sulfa antibiotics, sulfasalazine, tape, and theophyllines.  MEDICATIONS:  Current Outpatient Medications  Medication Sig Dispense Refill   acetaminophen (TYLENOL) 325 MG tablet Take 325 mg by mouth every 6 (six) hours as needed.     alendronate (FOSAMAX) 70 MG tablet Take 70 mg by mouth once a week. Take with a full glass of water on an empty stomach.     ALPRAZolam (XANAX) 0.25 MG tablet Take 0.25 mg by mouth as needed for anxiety. Before pap smears     atorvastatin (LIPITOR) 40 MG tablet Take 40 mg by mouth daily.     calcium citrate-vitamin D (CITRACAL+D) 315-200 MG-UNIT per tablet Take 1 tablet  by mouth 2 (two) times daily.     carbamazepine (TEGRETOL) 200 MG tablet Take 200 mg by mouth 3 (three) times daily.     Cholecalciferol 100 MCG (4000 UT) TABS Take 1,000 Units by mouth.     esomeprazole (NEXIUM) 40 MG capsule Take 40 mg by mouth daily at 12 noon.     famotidine (PEPCID) 20 MG tablet Take 1 tablet by mouth 2 (two) times daily.     hydroxypropyl methylcellulose / hypromellose (ISOPTO TEARS / GONIOVISC) 2.5 % ophthalmic solution 1 drop as needed for dry eyes.     levothyroxine (SYNTHROID) 112 MCG tablet Take 112 mcg by mouth every morning.     magnesium oxide (MAG-OX) 400 (240 Mg) MG tablet Take 1 tablet by mouth 3 (three) times daily.     magnesium oxide (MAG-OX) 400 MG tablet Take 1 tablet by mouth 3 (three) times daily.     naproxen (NAPROSYN) 375 MG tablet Take 375 mg by mouth 2 (two) times daily with a meal.     OLANZapine (ZYPREXA)  10 MG tablet Take 1 tablet (10 mg total) by mouth at bedtime. 30 tablet 0   OLANZapine (ZYPREXA) 5 MG tablet Take 1 tablet (5 mg total) by mouth at bedtime. 30 tablet 1   PARoxetine (PAXIL) 20 MG tablet Take 20 mg by mouth daily.     PARoxetine (PAXIL) 20 MG tablet Take 1 tablet (20 mg total) by mouth daily. 30 tablet 1   potassium chloride 20 MEQ/15ML (10%) SOLN Take 10 mEq by mouth daily.     vitamin E 400 UNIT capsule Take 400 Units by mouth daily.     Wheat Dextrin (BENEFIBER PO) Take by mouth.     No current facility-administered medications for this visit.     PHYSICAL EXAMINATION: ECOG PERFORMANCE STATUS: 2 - Symptomatic, <50% confined to bed Vitals:   01/13/22 1034  BP: (!) 119/58  Pulse: 82  Temp: 98.9 F (37.2 C)  SpO2: 100%   Filed Weights   01/13/22 1034  Weight: 124 lb 14.4 oz (56.7 kg)     Physical Exam Constitutional:      General: She is not in acute distress.    Comments: Patient sits in the wheelchair  HENT:     Head: Normocephalic and atraumatic.  Eyes:     General: No scleral icterus. Cardiovascular:     Rate and Rhythm: Normal rate and regular rhythm.     Heart sounds: Normal heart sounds.  Pulmonary:     Effort: Pulmonary effort is normal. No respiratory distress.     Breath sounds: No wheezing.  Abdominal:     General: Bowel sounds are normal. There is no distension.     Palpations: Abdomen is soft.  Musculoskeletal:        General: Deformity present.     Cervical back: Normal range of motion and neck supple.     Comments: Contracture of left upper extremity in the left lower extremity.  Skin:    General: Skin is warm and dry.     Findings: No erythema or rash.  Neurological:     Mental Status: She is alert. Mental status is at baseline.  Psychiatric:        Mood and Affect: Mood normal.     LABORATORY DATA:  I have reviewed the data as listed    Latest Ref Rng & Units 03/17/2021    3:30 PM 05/16/2020    1:34 PM 07/15/2019    5:19  PM  CBC  WBC 4.0 - 10.5 K/uL 6.5  7.2  7.3   Hemoglobin 12.0 - 15.0 g/dL 53.6  64.4  6.7   Hematocrit 36.0 - 46.0 % 37.4  39.3  22.7   Platelets 150 - 400 K/uL 302  297  421       Latest Ref Rng & Units 03/17/2021    3:30 PM 06/14/2020   10:13 AM 05/16/2020    1:34 PM  CMP  Glucose 70 - 99 mg/dL 034   742   BUN 8 - 23 mg/dL 21   19   Creatinine 5.95 - 1.00 mg/dL 6.38  7.56  4.33   Sodium 135 - 145 mmol/L 138   136   Potassium 3.5 - 5.1 mmol/L 4.1   3.6   Chloride 98 - 111 mmol/L 107   104   CO2 22 - 32 mmol/L 25   21   Calcium 8.9 - 10.3 mg/dL 9.3   9.0   Total Protein 6.5 - 8.1 g/dL 7.6   7.0   Total Bilirubin 0.3 - 1.2 mg/dL 0.6   0.4   Alkaline Phos 38 - 126 U/L 83   72   AST 15 - 41 U/L 27   25   ALT 0 - 44 U/L 20   21       RADIOGRAPHIC STUDIES: I have personally reviewed the radiological images as listed and agreed with the findings in the report. CT CHEST WO CONTRAST  Result Date: 01/07/2022 CLINICAL DATA:  Right lower lobe pulmonary nodule. EXAM: CT CHEST WITHOUT CONTRAST TECHNIQUE: Multidetector CT imaging of the chest was performed following the standard protocol without IV contrast. RADIATION DOSE REDUCTION: This exam was performed according to the departmental dose-optimization program which includes automated exposure control, adjustment of the mA and/or kV according to patient size and/or use of iterative reconstruction technique. COMPARISON:  01/04/2021 FINDINGS: Cardiovascular: The heart size is normal. No substantial pericardial effusion. Mild atherosclerotic calcification is noted in the wall of the thoracic aorta. Mediastinum/Nodes: No mediastinal lymphadenopathy. No evidence for gross hilar lymphadenopathy although assessment is limited by the lack of intravenous contrast on the current study. Moderate hiatal hernia. The esophagus has normal imaging features. There is no axillary lymphadenopathy. Lungs/Pleura: 5 mm peripheral right lung nodule identified  previously is stable on 84/3 today. The 1.4 cm nodule identified previously in the posterior right lower lobe is 1.1 cm today on image 114/3. 7 mm right upper lobe ground-glass nodule on 74/3 is new in the interval. Patchy ground-glass opacity in the posterior right upper lobe on image 49/3 is also new. No new suspicious pulmonary nodule or mass in the left lung. No pulmonary edema or pleural effusion Upper Abdomen: Unremarkable. Musculoskeletal: No worrisome lytic or sclerotic osseous abnormality. IMPRESSION: 1. The 1.4 cm posterior right lower lobe pulmonary nodule identified previously measures slightly smaller today. Given the 1 year stability, imaging features are considered most compatible with benign etiology. 2. New 7 mm right upper lobe ground-glass nodule with patchy ground-glass opacity in the posterior right upper lobe. Imaging features are most suggestive of an infectious/inflammatory etiology. Non-contrast chest CT at 3-6 months is recommended. If the ground glass opacities persist, subsequent management will be based upon the most suspicious findings. This recommendation follows the consensus statement: Guidelines for Management of Incidental Pulmonary Nodules Detected on CT Images: From the Fleischner Society 2017; Radiology 2017; 281: 228-243. 3. Moderate hiatal hernia. 4. Aortic Atherosclerosis (ICD10-I70.0). Electronically Signed   By: Minerva Areola  Molli PoseyMansell M.D.   On: 01/07/2022 10:55       ASSESSMENT & PLAN:  1. Lung nodule    # Right lung nodule 07/02/2020 no activity on PET scan. 01/04/2021, CT chest without contrast showed stable size of the lung nodule.  Likely benign. 01/07/21 CT chest without contrast showed showed stable/slightly smaller 1.4 cm posterior right lower lobe pulmonary nodule.  Likely benign.  New right upper lobe groundglass nodule with patchy groundglass opacity.  I recommend repeat CT scan in 6 months.  Chronic CT changes explained to patient as well.  #Patient is overdue  for mammogram.  Patient would like to further discuss with her primary care provider and get mammogram ordered.  Orders Placed This Encounter  Procedures   CT Chest Wo Contrast    Standing Status:   Future    Standing Expiration Date:   01/13/2023    Order Specific Question:   Preferred imaging location?    Answer:   [facility]    All questions were answered. The patient knows to call the clinic with any problems questions or concerns.  cc Dorothey BasemanBronstein, David, MD    Return of visit: 6 months.  Rickard PatienceZhou Jameika Kinn, MD, PhD Hematology Oncology 01/13/2022

## 2022-01-13 NOTE — Progress Notes (Signed)
No new concerns today 

## 2022-01-15 ENCOUNTER — Other Ambulatory Visit: Payer: Self-pay | Admitting: Psychiatry

## 2022-01-26 ENCOUNTER — Other Ambulatory Visit: Payer: Self-pay | Admitting: Psychiatry

## 2022-02-01 NOTE — Progress Notes (Unsigned)
BH MD/PA/NP OP Progress Note  02/03/2022 3:01 PM Michele James  MRN:  [id number]  Chief Complaint:  Chief Complaint  Patient presents with   Follow-up   HPI:  This is a follow-up appointment for schizophrenia spectrum disorder and depression.  She states that she does not have bowel movement for the past 2 days.  She takes Pepto-Bismol, which made her feel nauseated.  She denies any constipation otherwise.  She agrees to contact her PCP if this issue continues (of note, she repeated this another time during the visit).  When she was asked about her daily routine, she talks about her father, who helps her move around. She watches TV, and reads news paper.  She enjoys watching traffic and see which car does not stop on red sign.  She sleeps well.  She denies change in appetite.  She denies feeling depressed or anxiety.  She denies SI, HI, hallucinations, paranoia.  She denies ideas of reference.   Her father presents to the visit. Although she still has some moments of "memory block (pause)," it has become less frequent and less duration.  He thinks she is back to herself, and denies any concern.   Support: parents, RN who comes twice a week Household: father, aunt (68) Marital status:single Number of children:0  Employment: unemployed (used to be employed at [employer] in 2021, doing Diplomatic Services operational officer work) Education:  12 th grade Last PCP / ongoing medical evaluation:  Wt Readings from Last 3 Encounters:  01/13/22 124 lb 14.4 oz (56.7 kg)  12/23/21 126 lb (57.2 kg)  03/17/21 150 lb (68 kg)    Visit Diagnosis:    ICD-10-CM   1. 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 MDD (major depressive disorder), recurrent, in partial remission (HCC)  F33.41       Past Psychiatric History: Please see initial evaluation for full details. I have reviewed the history. No updates at this time.     Past Medical History:  Past Medical History:  Diagnosis Date   Anemia    Arthritis    knees    Cerebral palsy (HCC)    Depression    Diabetes mellitus without complication (HCC)    Diverticulosis    Dysphagia    Eczema    GERD (gastroesophageal reflux disease)    Glaucoma    Hemorrhoids    Hyperlipidemia    Hypothyroidism    Seizures (HCC)    none in over 10 yrs    Past Surgical History:  Procedure Laterality Date   BREAST BIOPSY Right 03/22/2020   stereo bx, ribbon clip, path pending    CATARACT EXTRACTION W/PHACO Right 02/06/2016   Procedure: CATARACT EXTRACTION PHACO AND INTRAOCULAR LENS PLACEMENT (IOC);  Surgeon: Lockie Mola, MD;  Location: [facility];  Service: Ophthalmology;  Laterality: Right;   CATARACT EXTRACTION W/PHACO Left 03/05/2016   Procedure: CATARACT EXTRACTION PHACO AND INTRAOCULAR LENS PLACEMENT (IOC);  Surgeon: Lockie Mola, MD;  Location: [facility];  Service: Ophthalmology;  Laterality: Left;   CHOLECYSTECTOMY N/A 11/16/2015   Procedure: LAPAROSCOPIC CHOLECYSTECTOMY;  Surgeon: Tiney Rouge III, MD;  Location: [facility];  Service: General;  Laterality: N/A;  attempted cholangiogram   COLONOSCOPY WITH PROPOFOL N/A 12/25/2014   Procedure: COLONOSCOPY WITH PROPOFOL;  Surgeon: Wallace Cullens, MD;  Location: [facility];  Service: Gastroenterology;  Laterality: N/A;   COLONOSCOPY WITH PROPOFOL N/A 04/14/2019   Procedure: COLONOSCOPY WITH PROPOFOL;  Surgeon: Toledo, Boykin Nearing, MD;  Location: [facility];  Service: Gastroenterology;  Laterality: N/A;   ENDOSCOPIC RETROGRADE CHOLANGIOPANCREATOGRAPHY (ERCP) WITH PROPOFOL N/A 11/18/2015   Procedure: ENDOSCOPIC RETROGRADE CHOLANGIOPANCREATOGRAPHY (ERCP) WITH PROPOFOL;  Surgeon: Lucilla Lame, MD;  Location: [facility];  Service: Endoscopy;  Laterality: N/A;   ESOPHAGOGASTRODUODENOSCOPY (EGD) WITH PROPOFOL N/A 04/14/2019   Procedure: ESOPHAGOGASTRODUODENOSCOPY (EGD) WITH PROPOFOL;  Surgeon: Toledo, Benay Pike, MD;  Location: [facility];  Service: Gastroenterology;  Laterality: N/A;   EYE  SURGERY     Cataract Surgery    HERNIA REPAIR      Family Psychiatric History: Please see initial evaluation for full details. I have reviewed the history. No updates at this time.     Family History:  Family History  Problem Relation Age of Onset   High blood pressure Mother    Hyperlipidemia Mother    Diabetes Mellitus II Mother    Stroke Mother    Osteoporosis Mother    High blood pressure Father    Breast cancer Maternal Aunt    Stroke Maternal Grandfather    Diabetes Mellitus II Maternal Grandfather    Stroke Maternal Grandmother    Diabetes Mellitus II Maternal Grandmother     Social History:  Social History   Socioeconomic History   Marital status: Single    Spouse name: Not on file   Number of children: Not on file   Years of education: Not on file   Highest education level: Not on file  Occupational History   Not on file  Tobacco Use   Smoking status: Former    Types: Cigarettes    Quit date: 06/20/1980    Years since quitting: 41.6   Smokeless tobacco: Never   Tobacco comments:    smoked socially in early 1980s (reports for 4 months)  Vaping Use   Vaping Use: Never used  Substance and Sexual Activity   Alcohol use: No   Drug use: No   Sexual activity: Not Currently  Other Topics Concern   Not on file  Social History Narrative   Not on file   Social Determinants of Health   Financial Resource Strain: Not on file  Food Insecurity: Not on file  Transportation Needs: Not on file  Physical Activity: Not on file  Stress: Not on file  Social Connections: Not on file    Allergies:  Allergies  Allergen Reactions   Aminophylline Other (See Comments)    Headache   Cinnamon Other (See Comments)    Stomachache    Advil [Ibuprofen] Itching and Rash    Pt states she can take ibuprofen   Amoxicillin Rash   Latex Rash   Nsaids Rash   Penicillins Nausea And Vomiting   Potassium Nausea And Vomiting and Nausea Only   Potassium-Containing Compounds  Nausea And Vomiting   Septra [Sulfamethoxazole-Trimethoprim] Rash   Sulfa Antibiotics Rash   Sulfasalazine Rash   Tape Rash    Paper tape ok   Theophyllines Other (See Comments)    Headache     Metabolic Disorder Labs: No results found for: "HGBA1C", "MPG" No results found for: "PROLACTIN" No results found for: "CHOL", "TRIG", "HDL", "CHOLHDL", "VLDL", "LDLCALC" Lab Results  Component Value Date   TSH 0.686 03/17/2021   TSH 0.172 (L) 05/16/2020    Therapeutic Level Labs: No results found for: "LITHIUM" No results found for: "VALPROATE" No results found for: "CBMZ"  Current Medications: Current Outpatient Medications  Medication Sig Dispense Refill   acetaminophen (TYLENOL) 325 MG tablet Take 325 mg by mouth every 6 (six) hours  as needed.     alendronate (FOSAMAX) 70 MG tablet Take 70 mg by mouth once a week. Take with a full glass of water on an empty stomach.     ALPRAZolam (XANAX) 0.25 MG tablet Take 0.25 mg by mouth as needed for anxiety. Before pap smears     atorvastatin (LIPITOR) 40 MG tablet Take 40 mg by mouth daily.     calcium citrate-vitamin D (CITRACAL+D) 315-200 MG-UNIT per tablet Take 1 tablet by mouth 2 (two) times daily.     carbamazepine (TEGRETOL) 200 MG tablet Take 200 mg by mouth 3 (three) times daily.     Cholecalciferol 100 MCG (4000 UT) TABS Take 1,000 Units by mouth.     esomeprazole (NEXIUM) 40 MG capsule Take 40 mg by mouth daily at 12 noon.     famotidine (PEPCID) 20 MG tablet Take 1 tablet by mouth 2 (two) times daily.     hydroxypropyl methylcellulose / hypromellose (ISOPTO TEARS / GONIOVISC) 2.5 % ophthalmic solution 1 drop as needed for dry eyes.     levothyroxine (SYNTHROID) 112 MCG tablet Take 112 mcg by mouth every morning.     magnesium oxide (MAG-OX) 400 (240 Mg) MG tablet Take 1 tablet by mouth 3 (three) times daily.     magnesium oxide (MAG-OX) 400 MG tablet Take 1 tablet by mouth 3 (three) times daily.     naproxen (NAPROSYN) 375 MG  tablet Take 375 mg by mouth 2 (two) times daily with a meal.     OLANZapine (ZYPREXA) 10 MG tablet Take 1 tablet (10 mg total) by mouth at bedtime. 30 tablet 0   potassium chloride 20 MEQ/15ML (10%) SOLN Take 10 mEq by mouth daily.     vitamin E 400 UNIT capsule Take 400 Units by mouth daily.     Wheat Dextrin (BENEFIBER PO) Take by mouth.     [START ON 02/22/2022] OLANZapine (ZYPREXA) 5 MG tablet Take 1 tablet (5 mg total) by mouth at bedtime. 30 tablet 1   PARoxetine (PAXIL) 20 MG tablet Take 1 tablet (20 mg total) by mouth daily. 30 tablet 1   No current facility-administered medications for this visit.     Musculoskeletal: Strength & Muscle Tone: within normal limits Gait & Station:  N/A (in a wheel chair) Patient leans: N/A  Psychiatric Specialty Exam: Review of Systems  Psychiatric/Behavioral:  Negative for agitation, behavioral problems, confusion, decreased concentration, dysphoric mood, hallucinations, self-injury, sleep disturbance and suicidal ideas. The patient is not nervous/anxious and is not hyperactive.   All other systems reviewed and are negative.   Blood pressure 122/72, pulse 88, temperature 97.9 F (36.6 C), temperature source Temporal, SpO2 97 %.There is no height or weight on file to calculate BMI.  General Appearance: Fairly Groomed  Eye Contact:  Good  Speech:  Clear and Coherent  Volume:  Normal  Mood:   good  Affect:  Blunt, Congruent, and Restricted  Thought Process:  Coherent, concrete  Orientation:  Full (Time, Place, and Person)  Thought Content: Logical   Suicidal Thoughts:  No  Homicidal Thoughts:  No  Memory:  Immediate;   Good  Judgement:  Good  Insight:  Present  Psychomotor Activity:  Normalno rigidity, no tremors (left arm with contracture)  Concentration:  Concentration: Good and Attention Span: Good  Recall:  Good  Fund of Knowledge: Good  Language: Good  Akathisia:  No  Handed:  Right  AIMS (if indicated): not done  Assets:   Communication Skills Desire for  Improvement  ADL's:  Intact  Cognition: WNL  Sleep:  Good   Screenings: GAD-7    Flowsheet Row Office Visit from 12/23/2021 in [facility]  Total GAD-7 Score 1      PHQ2-9    Flowsheet Row Office Visit from 02/03/2022 in [facility] Office Visit from 12/23/2021 in [facility] Office Visit from 11/04/2021 in [facility]  PHQ-2 Total Score 0 0 0      Flowsheet Row Office Visit from 02/03/2022 in [facility] ED from 03/17/2021 in [facility] from 05/16/2020 in [facility]  C-SSRS RISK CATEGORY No Risk No Risk Error: Question 6 not populated        Assessment and Plan:  NOVA SCHMUHL is a 66 y.o. year old female with a history of depression, anxiety, seizure disorder, cerebral palsy, GERD, osteoporosis, who presents for follow up appointment for below.    1. Schizophreniform disorder (HCC) 2. MDD (major depressive disorder), recurrent, in partial remission (HCC) R/o schizophrenia Although exam is notable for concrete thought process, and occasional somatic preoccupation, there have has been improvement in tangential thought process, hallucinations and paranoia since uptitration of olanzapine.  Noted that she has no known developmental disorder or intellectual disability. Collateral is limited due to her elderly father reporting struggling with memory loss, and it is unclear as to how long she experiences these symptom, although there is a note indicating that she had "possibly schizophrenia."  Will continue current dose of olanzapine to target schizophrenia spectrum disorder.  Discussed potential metabolic side effect, drowsiness and EPS.  Will continue current dose of Paxil to target depression.    Plan Continue olanzapine 5 mg  in the a.m., 10 mg at night (has total of 90 days supply from 9/4)  Continue Paxil 20 mg daily Next appointment: 11/6 at 2 PM for 30 mins, in person    The patient demonstrates the following risk factors for suicide: Chronic risk factors for suicide include: psychiatric disorder of depression . Acute risk factors for suicide include: unemployment. Protective factors for this patient include: positive social support and hope for the future. Considering these factors, the overall suicide risk at this point appears to be low. Patient is appropriate for outpatient follow up.       Collaboration of Care: Collaboration of Care: Other reviewed notes in Epic  Patient/Guardian was advised Release of Information must be obtained prior to any record release in order to collaborate their care with an outside provider. Patient/Guardian was advised if they have not already done so to contact the registration department to sign all necessary forms in order for [place] to release information regarding their care.   Consent: Patient/Guardian gives verbal consent for treatment and assignment of benefits for services provided during this visit. Patient/Guardian expressed understanding and agreed to proceed.    Michele Hotter, MD 02/03/2022, 3:01 PM

## 2022-02-03 ENCOUNTER — Ambulatory Visit (INDEPENDENT_AMBULATORY_CARE_PROVIDER_SITE_OTHER): Payer: Medicare Other | Admitting: Psychiatry

## 2022-02-03 ENCOUNTER — Encounter: Payer: Self-pay | Admitting: Psychiatry

## 2022-02-03 VITALS — BP 122/72 | HR 88 | Temp 97.9°F

## 2022-02-03 DIAGNOSIS — F3341 Major depressive disorder, recurrent, in partial remission: Secondary | ICD-10-CM

## 2022-02-03 DIAGNOSIS — F2081 Schizophreniform disorder: Secondary | ICD-10-CM | POA: Diagnosis not present

## 2022-02-03 MED ORDER — OLANZAPINE 5 MG PO TABS: 5.0000 mg | ORAL_TABLET | Freq: Every day | ORAL | 1 refills | Status: DC

## 2022-02-03 NOTE — Patient Instructions (Signed)
Continue olanzapine 5 mg in the a.m., 10 mg at night  Continue Paxil 20 mg daily Next appointment: 11/6 at 2 PM

## 2022-03-16 ENCOUNTER — Other Ambulatory Visit: Payer: Self-pay | Admitting: Psychiatry

## 2022-04-05 ENCOUNTER — Other Ambulatory Visit: Payer: Self-pay | Admitting: Psychiatry

## 2022-04-15 ENCOUNTER — Telehealth: Payer: Self-pay | Admitting: Oncology
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
## 2022-04-15 NOTE — Telephone Encounter (Signed)
spoke with pt and notified her of appts scheduled. PT confirmed

## 2022-04-17 ENCOUNTER — Other Ambulatory Visit: Payer: Self-pay

## 2022-04-17 ENCOUNTER — Ambulatory Visit (INDEPENDENT_AMBULATORY_CARE_PROVIDER_SITE_OTHER): Payer: Medicare Other | Admitting: Psychiatry

## 2022-04-17 ENCOUNTER — Encounter: Payer: Self-pay | Admitting: Psychiatry

## 2022-04-17 VITALS — BP 112/66 | HR 94

## 2022-04-17 DIAGNOSIS — F3341 Major depressive disorder, recurrent, in partial remission: Secondary | ICD-10-CM | POA: Diagnosis not present

## 2022-04-17 DIAGNOSIS — D509 Iron deficiency anemia, unspecified: Secondary | ICD-10-CM

## 2022-04-17 DIAGNOSIS — F2081 Schizophreniform disorder: Secondary | ICD-10-CM | POA: Diagnosis not present

## 2022-04-17 DIAGNOSIS — R399 Unspecified symptoms and signs involving the genitourinary system: Secondary | ICD-10-CM

## 2022-04-17 DIAGNOSIS — R911 Solitary pulmonary nodule: Secondary | ICD-10-CM

## 2022-04-17 DIAGNOSIS — D649 Anemia, unspecified: Secondary | ICD-10-CM

## 2022-04-17 MED ORDER — OLANZAPINE 10 MG PO TABS: 10.0000 mg | ORAL_TABLET | Freq: Every day | ORAL | 0 refills | Status: DC

## 2022-04-18 ENCOUNTER — Inpatient Hospital Stay
Admission: EM | Admit: 2022-04-18 | Discharge: 2022-04-21 | DRG: 379 | Disposition: A | Discharge status: Home / Self Care | Payer: Medicare Other | Source: Ambulatory Visit | Attending: Student | Admitting: Student

## 2022-04-18 ENCOUNTER — Inpatient Hospital Stay (HOSPITAL_BASED_OUTPATIENT_CLINIC_OR_DEPARTMENT_OTHER): Payer: Medicare Other | Admitting: Oncology

## 2022-04-18 ENCOUNTER — Encounter: Payer: Self-pay | Admitting: Emergency Medicine

## 2022-04-18 ENCOUNTER — Other Ambulatory Visit: Payer: Self-pay

## 2022-04-18 ENCOUNTER — Encounter: Payer: Self-pay | Admitting: Oncology

## 2022-04-18 ENCOUNTER — Inpatient Hospital Stay: Payer: Medicare Other | Attending: Oncology

## 2022-04-18 ENCOUNTER — Ambulatory Visit: Payer: Medicare Other

## 2022-04-18 VITALS — BP 116/57 | HR 88 | Temp 97.2°F | Wt 130.0 lb

## 2022-04-18 DIAGNOSIS — K2951 Unspecified chronic gastritis with bleeding: Principal | ICD-10-CM | POA: Diagnosis present

## 2022-04-18 DIAGNOSIS — Z87891 Personal history of nicotine dependence: Secondary | ICD-10-CM | POA: Insufficient documentation

## 2022-04-18 DIAGNOSIS — Z79890 Hormone replacement therapy (postmenopausal): Secondary | ICD-10-CM

## 2022-04-18 DIAGNOSIS — Z823 Family history of stroke: Secondary | ICD-10-CM

## 2022-04-18 DIAGNOSIS — Z9049 Acquired absence of other specified parts of digestive tract: Secondary | ICD-10-CM

## 2022-04-18 DIAGNOSIS — F419 Anxiety disorder, unspecified: Secondary | ICD-10-CM | POA: Diagnosis present

## 2022-04-18 DIAGNOSIS — Z8262 Family history of osteoporosis: Secondary | ICD-10-CM

## 2022-04-18 DIAGNOSIS — R911 Solitary pulmonary nodule: Secondary | ICD-10-CM | POA: Diagnosis not present

## 2022-04-18 DIAGNOSIS — G40909 Epilepsy, unspecified, not intractable, without status epilepticus: Secondary | ICD-10-CM | POA: Diagnosis present

## 2022-04-18 DIAGNOSIS — R262 Difficulty in walking, not elsewhere classified: Secondary | ICD-10-CM | POA: Diagnosis present

## 2022-04-18 DIAGNOSIS — R569 Unspecified convulsions: Secondary | ICD-10-CM

## 2022-04-18 DIAGNOSIS — R918 Other nonspecific abnormal finding of lung field: Secondary | ICD-10-CM | POA: Diagnosis present

## 2022-04-18 DIAGNOSIS — K219 Gastro-esophageal reflux disease without esophagitis: Secondary | ICD-10-CM | POA: Diagnosis present

## 2022-04-18 DIAGNOSIS — F32A Depression, unspecified: Secondary | ICD-10-CM | POA: Diagnosis present

## 2022-04-18 DIAGNOSIS — G809 Cerebral palsy, unspecified: Secondary | ICD-10-CM | POA: Diagnosis present

## 2022-04-18 DIAGNOSIS — Z888 Allergy status to other drugs, medicaments and biological substances status: Secondary | ICD-10-CM

## 2022-04-18 DIAGNOSIS — D500 Iron deficiency anemia secondary to blood loss (chronic): Secondary | ICD-10-CM | POA: Diagnosis present

## 2022-04-18 DIAGNOSIS — Z79899 Other long term (current) drug therapy: Secondary | ICD-10-CM | POA: Diagnosis not present

## 2022-04-18 DIAGNOSIS — Z91048 Other nonmedicinal substance allergy status: Secondary | ICD-10-CM

## 2022-04-18 DIAGNOSIS — Z881 Allergy status to other antibiotic agents status: Secondary | ICD-10-CM

## 2022-04-18 DIAGNOSIS — D649 Anemia, unspecified: Secondary | ICD-10-CM

## 2022-04-18 DIAGNOSIS — Z882 Allergy status to sulfonamides status: Secondary | ICD-10-CM

## 2022-04-18 DIAGNOSIS — K449 Diaphragmatic hernia without obstruction or gangrene: Secondary | ICD-10-CM | POA: Diagnosis present

## 2022-04-18 DIAGNOSIS — Z791 Long term (current) use of non-steroidal anti-inflammatories (NSAID): Secondary | ICD-10-CM

## 2022-04-18 DIAGNOSIS — E039 Hypothyroidism, unspecified: Secondary | ICD-10-CM | POA: Diagnosis present

## 2022-04-18 DIAGNOSIS — Z83438 Family history of other disorder of lipoprotein metabolism and other lipidemia: Secondary | ICD-10-CM

## 2022-04-18 DIAGNOSIS — Z993 Dependence on wheelchair: Secondary | ICD-10-CM

## 2022-04-18 DIAGNOSIS — Z803 Family history of malignant neoplasm of breast: Secondary | ICD-10-CM

## 2022-04-18 DIAGNOSIS — M1990 Unspecified osteoarthritis, unspecified site: Secondary | ICD-10-CM | POA: Diagnosis present

## 2022-04-18 DIAGNOSIS — E785 Hyperlipidemia, unspecified: Secondary | ICD-10-CM | POA: Diagnosis present

## 2022-04-18 DIAGNOSIS — Z886 Allergy status to analgesic agent status: Secondary | ICD-10-CM

## 2022-04-18 DIAGNOSIS — Z833 Family history of diabetes mellitus: Secondary | ICD-10-CM

## 2022-04-18 DIAGNOSIS — Z91040 Latex allergy status: Secondary | ICD-10-CM

## 2022-04-18 DIAGNOSIS — E119 Type 2 diabetes mellitus without complications: Secondary | ICD-10-CM | POA: Diagnosis present

## 2022-04-18 DIAGNOSIS — T39315A Adverse effect of propionic acid derivatives, initial encounter: Secondary | ICD-10-CM | POA: Diagnosis present

## 2022-04-18 DIAGNOSIS — Z7983 Long term (current) use of bisphosphonates: Secondary | ICD-10-CM

## 2022-04-18 DIAGNOSIS — Z880 Allergy status to penicillin: Secondary | ICD-10-CM

## 2022-04-18 DIAGNOSIS — Z91018 Allergy to other foods: Secondary | ICD-10-CM

## 2022-04-18 LAB — HEMOGLOBIN AND HEMATOCRIT, BLOOD
HCT: 26.3 % — ABNORMAL LOW (ref 36.0–46.0)
Hemoglobin: 8.2 g/dL — ABNORMAL LOW (ref 12.0–15.0)

## 2022-04-18 LAB — BASIC METABOLIC PANEL
Anion gap: 8 (ref 5–15)
BUN: 14 mg/dL (ref 8–23)
CO2: 26 mmol/L (ref 22–32)
Calcium: 8.5 mg/dL — ABNORMAL LOW (ref 8.9–10.3)
Chloride: 101 mmol/L (ref 98–111)
Creatinine, Ser: 0.54 mg/dL (ref 0.44–1.00)
GFR, Estimated: >60 mL/min (ref >60)
Glucose, Bld: 121 mg/dL — ABNORMAL HIGH (ref 70–99)
Potassium: 3.8 mmol/L (ref 3.5–5.1)
Sodium: 135 mmol/L (ref 135–145)

## 2022-04-18 LAB — CBC WITH DIFFERENTIAL/PLATELET
Abs Immature Granulocytes: 0.01 10*3/uL (ref 0.00–0.07)
Abs Immature Granulocytes: 0.03 10*3/uL (ref 0.00–0.07)
Basophils Absolute: 0 10*3/uL (ref 0.0–0.1)
Basophils Absolute: 0 10*3/uL (ref 0.0–0.1)
Basophils Relative: 1 %
Basophils Relative: 1 %
Eosinophils Absolute: 0.4 10*3/uL (ref 0.0–0.5)
Eosinophils Absolute: 0.4 10*3/uL (ref 0.0–0.5)
Eosinophils Relative: 7 %
Eosinophils Relative: 6 %
HCT: 23.7 % — ABNORMAL LOW (ref 36.0–46.0)
HCT: 24.5 % — ABNORMAL LOW (ref 36.0–46.0)
Hemoglobin: 6.9 g/dL — ABNORMAL LOW (ref 12.0–15.0)
Hemoglobin: 7.1 g/dL — ABNORMAL LOW (ref 12.0–15.0)
Immature Granulocytes: 0 %
Immature Granulocytes: 1 %
Lymphocytes Relative: 29 %
Lymphocytes Relative: 25 %
Lymphs Abs: 1.5 10*3/uL (ref 0.7–4.0)
Lymphs Abs: 1.4 10*3/uL (ref 0.7–4.0)
MCH: 25.3 pg — ABNORMAL LOW (ref 26.0–34.0)
MCH: 25.3 pg — ABNORMAL LOW (ref 26.0–34.0)
MCHC: 29.1 g/dL — ABNORMAL LOW (ref 30.0–36.0)
MCHC: 29 g/dL — ABNORMAL LOW (ref 30.0–36.0)
MCV: 86.8 fL (ref 80.0–100.0)
MCV: 87.2 fL (ref 80.0–100.0)
Monocytes Absolute: 0.5 10*3/uL (ref 0.1–1.0)
Monocytes Absolute: 0.7 10*3/uL (ref 0.1–1.0)
Monocytes Relative: 10 %
Monocytes Relative: 12 %
Neutro Abs: 2.8 10*3/uL (ref 1.7–7.7)
Neutro Abs: 3.1 10*3/uL (ref 1.7–7.7)
Neutrophils Relative %: 53 %
Neutrophils Relative %: 55 %
Platelets: 425 10*3/uL — ABNORMAL HIGH (ref 150–400)
Platelets: 446 10*3/uL — ABNORMAL HIGH (ref 150–400)
RBC: 2.73 MIL/uL — ABNORMAL LOW (ref 3.87–5.11)
RBC: 2.81 MIL/uL — ABNORMAL LOW (ref 3.87–5.11)
RDW: 14 % (ref 11.5–15.5)
RDW: 14 % (ref 11.5–15.5)
WBC: 5.3 10*3/uL (ref 4.0–10.5)
WBC: 5.6 10*3/uL (ref 4.0–10.5)
nRBC: 0 % (ref 0.0–0.2)
nRBC: 0 % (ref 0.0–0.2)

## 2022-04-18 LAB — IRON AND TIBC
Iron: 20 ug/dL — ABNORMAL LOW (ref 28–170)
Saturation Ratios: 5 % — ABNORMAL LOW (ref 10.4–31.8)
TIBC: 377 ug/dL (ref 250–450)
UIBC: 357 ug/dL

## 2022-04-18 LAB — SAMPLE TO BLOOD BANK

## 2022-04-18 LAB — FERRITIN: Ferritin: 6 ng/mL — ABNORMAL LOW (ref 11–307)

## 2022-04-18 LAB — PREPARE RBC (CROSSMATCH)

## 2022-04-18 MED ORDER — PANTOPRAZOLE SODIUM 40 MG IV SOLR
40.0000 mg | Freq: Once | INTRAVENOUS | Status: AC
  Administered 2022-04-18: 40 mg via INTRAVENOUS
  Filled 2022-04-18: qty 10

## 2022-04-18 MED ORDER — PANTOPRAZOLE SODIUM 40 MG IV SOLR
40.0000 mg | Freq: Two times a day (BID) | INTRAVENOUS | Status: DC
  Administered 2022-04-18 – 2022-04-21 (×6): 40 mg via INTRAVENOUS
  Filled 2022-04-18 (×6): qty 10

## 2022-04-18 MED ORDER — ATORVASTATIN CALCIUM 20 MG PO TABS
40.0000 mg | ORAL_TABLET | Freq: Every day | ORAL | Status: DC
  Administered 2022-04-19 – 2022-04-21 (×2): 40 mg via ORAL
  Filled 2022-04-18 (×3): qty 2

## 2022-04-18 MED ORDER — POLYVINYL ALCOHOL 1.4 % OP SOLN
1.0000 [drp] | OPHTHALMIC | Status: DC | PRN
  Administered 2022-04-18 – 2022-04-20 (×3): 1 [drp] via OPHTHALMIC
  Filled 2022-04-18: qty 15

## 2022-04-18 MED ORDER — ACETAMINOPHEN 650 MG RE SUPP: 650.0000 mg | Freq: Four times a day (QID) | RECTAL | Status: DC | PRN

## 2022-04-18 MED ORDER — MAGNESIUM OXIDE -MG SUPPLEMENT 400 (240 MG) MG PO TABS: 400.0000 mg | ORAL_TABLET | Freq: Three times a day (TID) | ORAL | Status: DC

## 2022-04-18 MED ORDER — ACETAMINOPHEN 325 MG PO TABS: 650.0000 mg | ORAL_TABLET | Freq: Four times a day (QID) | ORAL | Status: DC | PRN

## 2022-04-18 MED ORDER — LEVOTHYROXINE SODIUM 112 MCG PO TABS
112.0000 ug | ORAL_TABLET | Freq: Every day | ORAL | Status: DC
  Administered 2022-04-19 – 2022-04-21 (×2): 112 ug via ORAL
  Filled 2022-04-18 (×3): qty 1

## 2022-04-18 MED ORDER — ACETAMINOPHEN 325 MG PO TABS: 325.0000 mg | ORAL_TABLET | Freq: Four times a day (QID) | ORAL | Status: DC | PRN

## 2022-04-18 MED ORDER — SODIUM CHLORIDE 0.9 % IV SOLN: INTRAVENOUS | Status: AC

## 2022-04-18 MED ORDER — OLANZAPINE 5 MG PO TABS
5.0000 mg | ORAL_TABLET | Freq: Every day | ORAL | Status: DC
  Administered 2022-04-19 – 2022-04-21 (×2): 5 mg via ORAL
  Filled 2022-04-18 (×3): qty 1

## 2022-04-18 MED ORDER — OYSTER SHELL CALCIUM/D3 500-5 MG-MCG PO TABS
1.0000 | ORAL_TABLET | Freq: Two times a day (BID) | ORAL | Status: DC
  Administered 2022-04-18 – 2022-04-21 (×5): 1 via ORAL
  Filled 2022-04-18 (×6): qty 1

## 2022-04-18 MED ORDER — ONDANSETRON HCL 4 MG PO TABS: 4.0000 mg | ORAL_TABLET | Freq: Four times a day (QID) | ORAL | Status: DC | PRN

## 2022-04-18 MED ORDER — ONDANSETRON HCL 4 MG/2ML IJ SOLN: 4.0000 mg | Freq: Four times a day (QID) | INTRAMUSCULAR | Status: DC | PRN

## 2022-04-18 MED ORDER — SODIUM CHLORIDE 0.9 % IV SOLN: 10.0000 mL/h | Freq: Once | INTRAVENOUS | Status: DC

## 2022-04-18 MED ORDER — MAGNESIUM OXIDE -MG SUPPLEMENT 400 (240 MG) MG PO TABS
400.0000 mg | ORAL_TABLET | Freq: Three times a day (TID) | ORAL | Status: DC
  Administered 2022-04-18 – 2022-04-21 (×7): 400 mg via ORAL
  Filled 2022-04-18 (×8): qty 1

## 2022-04-18 MED ORDER — CARBAMAZEPINE 200 MG PO TABS
200.0000 mg | ORAL_TABLET | Freq: Three times a day (TID) | ORAL | Status: DC
  Administered 2022-04-18 – 2022-04-21 (×7): 200 mg via ORAL
  Filled 2022-04-18 (×9): qty 1

## 2022-04-18 MED ORDER — PAROXETINE HCL 20 MG PO TABS
20.0000 mg | ORAL_TABLET | Freq: Every day | ORAL | Status: DC
  Administered 2022-04-19 – 2022-04-21 (×2): 20 mg via ORAL
  Filled 2022-04-18 (×3): qty 1

## 2022-04-18 MED ORDER — VITAMIN D 25 MCG (1000 UNIT) PO TABS
1000.0000 [IU] | ORAL_TABLET | Freq: Every day | ORAL | Status: DC
  Administered 2022-04-19 – 2022-04-21 (×2): 1000 [IU] via ORAL
  Filled 2022-04-18 (×3): qty 1

## 2022-04-18 MED ORDER — OLANZAPINE 5 MG PO TABS
10.0000 mg | ORAL_TABLET | Freq: Every day | ORAL | Status: DC
  Administered 2022-04-18 – 2022-04-20 (×3): 10 mg via ORAL
  Filled 2022-04-18 (×3): qty 2

## 2022-04-18 NOTE — Assessment & Plan Note (Signed)
Continue Zyprexa and Paxil

## 2022-04-18 NOTE — Assessment & Plan Note (Signed)
Right lung nodule 07/02/2020 no activity on PET scan. 01/04/2021, CT chest without contrast showed stable size of the lung nodule.  Likely benign. 01/07/21 CT chest without contrast showed showed stable/slightly smaller 1.4 cm posterior right lower lobe pulmonary nodule.  Likely benign.  New right upper lobe groundglass nodule with patchy groundglass opacity.  I recommend repeat CT scan in April

## 2022-04-18 NOTE — ED Provider Notes (Addendum)
[facility] Provider Note    Event Date/Time   First MD Initiated Contact with Patient 04/18/22 1110     (approximate)   History   Abnormal Labs   HPI  Michele James is a 67 y.o. female with history of diabetes, CP, hemorrhoids, duodenal diverticulum presents to the ER for low hemoglobin as well as shortness of breath for the past few weeks.  States that she has noted some intermittent dark tarry stools but none today.  She is not on any blood thinners.  She denies any chest pain no abdominal pain.     Physical Exam   Triage Vital Signs: ED Triage Vitals  Enc Vitals Group     BP 04/18/22 0939 (!) 106/50     Pulse Rate 04/18/22 0939 89     Resp 04/18/22 0939 18     Temp 04/18/22 0939 98.2 F (36.8 C)     Temp Source 04/18/22 0939 Oral     SpO2 04/18/22 0939 99 %     Weight 04/18/22 0937 130 lb (59 kg)     Height 04/18/22 0937 5\' 4"  (1.626 m)     Head Circumference --      Peak Flow --      Pain Score 04/18/22 0936 0     Pain Loc --      Pain Edu? --      Excl. in [employer]? --     Most recent vital signs: Vitals:   04/18/22 0939  BP: (!) 106/50  Pulse: 89  Resp: 18  Temp: 98.2 F (36.8 C)  SpO2: 99%     Constitutional: Alert  Eyes: Conjunctivae are normal.  Head: Atraumatic. Nose: No congestion/rhinnorhea. Mouth/Throat: Mucous membranes are moist.   Neck: Painless ROM.  Cardiovascular:   Good peripheral circulation. Respiratory: Normal respiratory effort.  No retractions.  Gastrointestinal: Soft and nontender.  Dark brown stool is guaiac positive. Musculoskeletal:  no deformity Neurologic:  MAE spontaneously. No gross focal neurologic deficits are appreciated.  Skin:  Skin is warm, dry and intact. No rash noted. Psychiatric: Mood and affect are normal. Speech and behavior are normal.    ED Results / Procedures / Treatments   Labs (all labs ordered are listed, but only abnormal results are displayed) Labs Reviewed  CBC WITH  DIFFERENTIAL/PLATELET - Abnormal; Notable for the following components:      Result Value   RBC 2.81 (*)    Hemoglobin 7.1 (*)    HCT 24.5 (*)    MCH 25.3 (*)    MCHC 29.0 (*)    Platelets 446 (*)    All other components within normal limits  BASIC METABOLIC PANEL - Abnormal; Notable for the following components:   Glucose, Bld 121 (*)    Calcium 8.5 (*)    All other components within normal limits  TYPE AND SCREEN  PREPARE RBC (CROSSMATCH)     EKG     RADIOLOGY    PROCEDURES:  Critical Care performed: Yes, see critical care procedure note(s)  .Critical Care  Performed by: Merlyn Lot, MD Authorized by: Merlyn Lot, MD   Critical care provider statement:    Critical care time (minutes):  35   Critical care was necessary to treat or prevent imminent or life-threatening deterioration of the following conditions:  Circulatory failure   Critical care was time spent personally by me on the following activities:  Ordering and performing treatments and interventions, ordering and review of laboratory studies, ordering  and review of radiographic studies, pulse oximetry, re-evaluation of patient's condition, review of old charts, obtaining history from patient or surrogate, examination of patient, evaluation of patient's response to treatment, discussions with primary provider, discussions with consultants and development of treatment plan with patient or surrogate    MEDICATIONS ORDERED IN ED: Medications  0.9 %  sodium chloride infusion (has no administration in time range)  pantoprazole (PROTONIX) injection 40 mg (has no administration in time range)     IMPRESSION / MDM / [place] / ED COURSE  I reviewed the triage vital signs and the nursing notes.                              Differential diagnosis includes, but is not limited to, UGIB, LGIB, anemia of chronic disease, iron deficiency anemia, hemorrhoid,  Patient presenting to the ER for  evaluation of symptoms as described above.  Based on symptoms, risk factors and considered above differential, this presenting complaint could reflect a potentially life-threatening illness therefore the patient will be placed on continuous pulse oximetry and telemetry for monitoring.  Laboratory evaluation will be sent to evaluate for the above complaints.  Patient with hemoglobin of 7 significant drop for her.  Based on history I suspect she might of had some GI bleeding over the past few weeks.  Her stool is guaiac positive but no melena no hematochezia doubt active bleed at this time.  She is pale appearing.  Given her symptomatic anemia age and risk factors will guaiac positive stool will consult hospitalist.  I have ordered 1 unit blood transfusion.  I have ordered IV Protonix.     FINAL CLINICAL IMPRESSION(S) / ED DIAGNOSES   Final diagnoses:  Symptomatic anemia     Rx / DC Orders   ED Discharge Orders     None        Note:  This document was prepared using Dragon voice recognition software and may include unintentional dictation errors.    Merlyn Lot, MD 04/18/22 1127    Merlyn Lot, MD 04/18/22 [phone]

## 2022-04-18 NOTE — ED Notes (Signed)
Family updated that pt moved to inpatient bed

## 2022-04-18 NOTE — Assessment & Plan Note (Signed)
Continue Synthroid °

## 2022-04-18 NOTE — Assessment & Plan Note (Signed)
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

## 2022-04-18 NOTE — Assessment & Plan Note (Addendum)
Today's blood work showed hemoglobin 6.9. Our infusion center and same-day surgery do not have capacity to do blood transfusion.  Recommend patient to go to [facility] triage nurse in the ER.  Anemia is likely due to iron deficiency.  Both ferritin and iron saturation were severely decreased.  Will set patient up for IV Venofer treatments.  I discussed the rationale and potential side effects of IV Venofer treatments with both patient and her husband.  They agree with the plan.  Will send patient to reestablish care with gastroenterologist for further evaluation.

## 2022-04-18 NOTE — Assessment & Plan Note (Signed)
Placed on seizure precautions Continue Carbamazepine

## 2022-04-18 NOTE — H&P (Signed)
History and Physical    Patient: Michele James:[id number] DOB: 1955/11/20 DOA: 04/18/2022 DOS: the patient was seen and examined on 04/18/2022 PCP: Dorothey Baseman, MD  Patient coming from: Home  Chief Complaint:  Chief Complaint  Patient presents with   Abnormal Labs   HPI: Michele James is a 67 y.o. 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 Patient had blood work done in the cancer center and was found to have a hemoglobin of 6.9g/dl.  Chart review shows that her blood count has been low over the past several weeks.  Chart review shows she had a hemoglobin of 12.2 in 04/23.  She states that her stools have been dark but she denies having any abdominal pain.  She denies having any hematemesis or hematochezia but is on naproxen twice daily.  She complains of occasional shortness of breath but denies having any chest pain, no dizziness, no lightheadedness or any falls. She denies having any fever, no chills, no cough, no urinary symptoms, no focal deficit, no blurred vision, no headache Abnormal labs include an iron of 20, ferritin of 6, hemoglobin 7.1 Stools were heme positive in the ER 1 unit of packed RBC was ordered in the ER Should be referred to observation status for further evaluation.   Review of Systems: As mentioned in the history of present illness. All other systems reviewed and are negative. Past Medical History:  Diagnosis Date   Anemia    Arthritis    knees   Cerebral palsy (HCC)    Depression    Diabetes mellitus without complication (HCC)    Diverticulosis    Dysphagia    Eczema    GERD (gastroesophageal reflux disease)    Glaucoma    Hemorrhoids    Hyperlipidemia    Hypothyroidism     Seizures (HCC)    none in over 10 yrs   Past Surgical History:  Procedure Laterality Date   BREAST BIOPSY Right 03/22/2020   stereo bx, ribbon clip, path pending    CATARACT EXTRACTION W/PHACO Right 02/06/2016   Procedure: CATARACT EXTRACTION PHACO AND INTRAOCULAR LENS PLACEMENT (IOC);  Surgeon: Lockie Mola, MD;  Location: [facility];  Service: Ophthalmology;  Laterality: Right;   CATARACT EXTRACTION W/PHACO Left 03/05/2016   Procedure: CATARACT EXTRACTION PHACO AND INTRAOCULAR LENS PLACEMENT (IOC);  Surgeon: Lockie Mola, MD;  Location: [facility];  Service: Ophthalmology;  Laterality: Left;   CHOLECYSTECTOMY N/A 11/16/2015   Procedure: LAPAROSCOPIC CHOLECYSTECTOMY;  Surgeon: Tiney Rouge III, MD;  Location: [facility] ORS;  Service: General;  Laterality: N/A;  attempted cholangiogram   COLONOSCOPY WITH PROPOFOL N/A 12/25/2014   Procedure: COLONOSCOPY WITH PROPOFOL;  Surgeon: Wallace Cullens, MD;  Location: [facility] ENDOSCOPY;  Service: Gastroenterology;  Laterality: N/A;   COLONOSCOPY WITH PROPOFOL N/A 04/14/2019   Procedure: COLONOSCOPY WITH PROPOFOL;  Surgeon: Toledo, Boykin Nearing, MD;  Location: [facility] ENDOSCOPY;  Service: Gastroenterology;  Laterality: N/A;   ENDOSCOPIC RETROGRADE CHOLANGIOPANCREATOGRAPHY (ERCP) WITH PROPOFOL N/A 11/18/2015   Procedure: ENDOSCOPIC RETROGRADE CHOLANGIOPANCREATOGRAPHY (ERCP) WITH PROPOFOL;  Surgeon: Midge Minium, MD;  Location: [facility] ENDOSCOPY;  Service: Endoscopy;  Laterality: N/A;   ESOPHAGOGASTRODUODENOSCOPY (EGD) WITH PROPOFOL N/A 04/14/2019   Procedure: ESOPHAGOGASTRODUODENOSCOPY (EGD) WITH PROPOFOL;  Surgeon: Toledo, Boykin Nearing, MD;  Location: [facility] ENDOSCOPY;  Service: Gastroenterology;  Laterality: N/A;   EYE SURGERY     Cataract Surgery    HERNIA REPAIR     Social History:  reports that she quit smoking about 41 years ago. Her smoking use included cigarettes. She has never used smokeless tobacco. She reports that she does not drink alcohol and  does not use drugs.  Allergies  Allergen Reactions   Aminophylline Other (See Comments)    Headache   Cinnamon Other (See Comments)    Stomachache    Advil [Ibuprofen] Itching and Rash    Pt states she can take ibuprofen   Amoxicillin Rash   Latex Rash   Nsaids Rash   Penicillins Nausea And Vomiting   Potassium Nausea And Vomiting and Nausea Only   Potassium-Containing Compounds Nausea And Vomiting   Septra [Sulfamethoxazole-Trimethoprim] Rash   Sulfa Antibiotics Rash   Sulfasalazine Rash   Tape Rash    Paper tape ok   Theophyllines Other (See Comments)    Headache     Family History  Problem Relation Age of Onset   High blood pressure Mother    Hyperlipidemia Mother    Diabetes Mellitus II Mother    Stroke Mother    Osteoporosis Mother    High blood pressure Father    Breast cancer Maternal Aunt    Stroke Maternal Grandfather    Diabetes Mellitus II Maternal Grandfather    Stroke Maternal Grandmother    Diabetes Mellitus II Maternal Grandmother     Prior to Admission medications   Medication Sig Start Date End Date Taking? Authorizing Provider  acetaminophen (TYLENOL) 325 MG tablet Take 325 mg by mouth every 6 (six) hours as needed.    [provider]  alendronate (FOSAMAX) 70 MG tablet Take 70 mg by mouth once a week. Take with a full glass of water on an empty stomach.    [provider]  ALPRAZolam Duanne Moron) 0.25 MG tablet Take 0.25 mg by mouth as needed for anxiety. Before pap smears    [provider]  atorvastatin (LIPITOR) 40 MG tablet Take 40 mg by mouth daily.    [provider]  calcium citrate-vitamin D (CITRACAL+D) 315-200 MG-UNIT per tablet Take 1 tablet by mouth 2 (two) times daily.    [provider]  carbamazepine (TEGRETOL) 200 MG tablet Take 200 mg by mouth 3 (three) times daily.    [provider]  Cholecalciferol 100 MCG (4000 UT) TABS Take 1,000 Units by mouth.    [provider]   esomeprazole (NEXIUM) 40 MG capsule Take 40 mg by mouth daily at 12 noon.    [provider]  famotidine (PEPCID) 20 MG tablet Take 1 tablet by mouth 2 (two) times daily. 04/23/20   [provider]  hydroxypropyl methylcellulose / hypromellose (ISOPTO TEARS / GONIOVISC) 2.5 % ophthalmic solution 1 drop as needed for dry eyes.    [provider]  levothyroxine (SYNTHROID) 112 MCG tablet Take 112 mcg by mouth every morning. 03/04/21   [provider]  magnesium oxide (MAG-OX) 400 (240 Mg) MG tablet Take 1 tablet by mouth 3 (three) times daily. 02/18/21   [provider]  magnesium oxide (MAG-OX) 400 MG tablet Take 1 tablet by mouth 3 (three) times daily. 01/13/22   [provider]  naproxen (NAPROSYN) 375 MG tablet Take 375 mg by mouth 2 (two) times daily with a meal.    [provider]  OLANZapine (ZYPREXA) 10 MG tablet Take 1 tablet (10 mg total) by mouth  at bedtime. 04/17/22 07/16/22  Norman Clay, MD  OLANZapine (ZYPREXA) 5 MG tablet Take 1 tablet (5 mg total) by mouth daily. 03/20/22 06/18/22  Norman Clay, MD  PARoxetine (PAXIL) 20 MG tablet Take 1 tablet (20 mg total) by mouth daily. 11/04/21 04/18/22  Norman Clay, MD  potassium chloride 20 MEQ/15ML (10%) SOLN Take 10 mEq by mouth daily.    [provider]  vitamin E 400 UNIT capsule Take 400 Units by mouth daily.    [provider]  Wheat Dextrin (BENEFIBER PO) Take by mouth.    [provider]    Physical Exam: Vitals:   04/18/22 0939 04/18/22 1209 04/18/22 1229 04/18/22 1309  BP: (!) 106/50 123/70  121/61  Pulse: 89 86  84  Resp: 18 18  17   Temp: 98.2 F (36.8 C)  98.3 F (36.8 C) 98.3 F (36.8 C)  TempSrc: Oral  Oral   SpO2: 99% 98%  100%  Weight:      Height:       Physical Exam Vitals and nursing note reviewed.  Constitutional:      Appearance: Normal appearance.  HENT:     Head: Normocephalic and atraumatic.     Nose: Nose normal.      Mouth/Throat:     Mouth: Mucous membranes are dry.  Eyes:     Comments: Pale conjunctiva  Cardiovascular:     Rate and Rhythm: Normal rate and regular rhythm.  Pulmonary:     Effort: Pulmonary effort is normal.     Breath sounds: Normal breath sounds.  Abdominal:     General: Abdomen is flat. Bowel sounds are normal.     Palpations: Abdomen is soft.  Musculoskeletal:        General: Normal range of motion.     Cervical back: Normal range of motion and neck supple.  Skin:    General: Skin is warm and dry.  Neurological:     Mental Status: She is alert and oriented to person, place, and time.     Comments: Contractures involving the left upper and lower extremity  Psychiatric:        Mood and Affect: Mood normal.        Behavior: Behavior normal.     Data Reviewed: Relevant notes from primary care and specialist visits, past discharge summaries as available in EHR, including Care Everywhere. Prior diagnostic testing as pertinent to current admission diagnoses Updated medications and problem lists for reconciliation ED course, including vitals, labs, imaging, treatment and response to treatment Triage notes, nursing and pharmacy notes and ED provider's notes Notable results as noted in HPI Labs reviewed.  8, chloride 101, bicarb 25 glucose 121, BUN 14, creatinine 0.54, calcium 8.5, 0.6, 24.5, platelet count 446 There are no new results to review at this time.  Assessment and Plan: * Iron deficiency anemia due to chronic blood loss Patient presents to the ER from the cancer center for evaluation of symptomatic iron deficiency anemia with a hemoglobin of 6.9 Patient has had melena stools but denies having any hematemesis or hematochezia and is on NSAIDs at home concerning for possible gastritis resulted in chronic blood loss versus peptic ulcer disease We will transfuse 1 unit of packed RBC Continue Protonix 40 mg IV every 12 Consult gastroenterology  Seizure [facility]) Placed  on seizure precautions Continue Carbamazepine  Hypothyroidism (acquired) Continue Synthroid  Depression Continue Zyprexa and Paxil  Cerebral palsy ([place]) With left upper and lower extremity contractures limiting mobility Patient is  wheelchair-bound and requires assistance with ADLs      Advance Care Planning:   Code Status: Full Code   Consults: Gastroenterology  Family Communication: Greater than 50% of time was spent discussing patient's condition and plan of care with her at bedside.  All questions and concerns have been addressed.  They verbalized understanding and agreed with the plan.  Severity of Illness: The appropriate patient status for this patient is OBSERVATION. Observation status is judged to be reasonable and necessary in order to provide the required intensity of service to ensure the patient's safety. The patient's presenting symptoms, physical exam findings, and initial radiographic and laboratory data in the context of their medical condition is felt to place them at decreased risk for further clinical deterioration. Furthermore, it is anticipated that the patient will be medically stable for discharge from the hospital within 2 midnights of admission.   Author: Lucile Shutters, MD 04/18/2022 1:52 PM  For on call review [email or web address].

## 2022-04-18 NOTE — Progress Notes (Signed)
Hematology/Oncology Progress note [phone] [phone]      Patient Care Team: Juluis Pitch, MD as PCP - General (Family Medicine) Telford Nab, RN as Oncology Nurse Navigator Earlie Server, MD as Consulting Physician (Oncology)  CHIEF COMPLAINTS/REASON FOR VISIT:  lung nodule  ASSESSMENT & PLAN:   Symptomatic anemia Today's blood work showed hemoglobin 6.9. Our infusion center and same-day surgery do not have capacity to do blood transfusion.  Recommend patient to go to [facility] triage nurse in the ER.  Anemia is likely due to iron deficiency.  Both ferritin and iron saturation were severely decreased.  Will set patient up for IV Venofer treatments.  I discussed the rationale and potential side effects of IV Venofer treatments with both patient and her husband.  They agree with the plan.  Will send patient to reestablish care with gastroenterologist for further evaluation.  Lung nodule Right lung nodule 07/02/2020 no activity on PET scan. 01/04/2021, CT chest without contrast showed stable size of the lung nodule.  Likely benign. 01/07/21 CT chest without contrast showed showed stable/slightly smaller 1.4 cm posterior right lower lobe pulmonary nodule.  Likely benign.  New right upper lobe groundglass nodule with patchy groundglass opacity.  I recommend repeat CT scan in April   Orders Placed This Encounter  Procedures   Folate    Standing Status:   Future    Standing Expiration Date:   04/19/2023   Vitamin B12    Standing Status:   Future    Standing Expiration Date:   04/19/2023   Follow-up All questions were answered. The patient knows to call the clinic with any problems, questions or concerns.  Earlie Server, MD, PhD [facility] Hematology Oncology 04/18/2022   HISTORY OF PRESENTING ILLNESS:   Patient has a history of seizure disorder/history of cerebral palsy Patient is a poor historian.  She was accompanied by her mother who has hearing  deficiency and not able to provide much history.Marland Kitchen   History was mainly obtained from reviewing medical records.  06/14/2020, CT chest with contrast showed a lobulated nodule with mildly spiculated margin in the posterior right chest persist and is suspicious for bronchogenic neoplasm based on morphologic repeat years Nodule along the minor fissure in the right chest measures 6 mm, nonspecific.  Moderately large hiatal hernia 11 mm distal common bile duct, previously 8 to 9 mm.  No pancreatic duct dilatation is visualized.  Prominence of the common bile duct on the prior study in this patient's post cholecystectomy.  Aortic atherosclerosis.  Patient was referred to establish care with oncology for further evaluation of the lung nodule.  She denies any cough, shortness of breath, hemoptysis, patient is a former smoker, and quit in 1982.  She reports only smoked for 4 months and mostly on weekends.  Patient has contractures and physical deformities which severely limits her  mobility.  She lives at home with parents.   INTERVAL HISTORY Michele James is a 67 y.o. female who has above history reviewed by me today presents for acute visit for anemia.  She was seen by me previously for lung nodule surveillance.   04/17/2022 CBC showed hemoglobin 7.5, MCV 87.6, platelet count 529 She had a history of iron deficiency anemia.  Iron panel has not been checked recently. Today patient denies seeing any blood in the stool.  She noticed occasional dark stool.  She denies feeling tired.  Poor historian and occasionally confused. she was accompanied by her husband.    Review of  Systems  Unable to perform ROS: Other  Constitutional:  Positive for appetite change. Negative for fatigue and unexpected weight change.  Respiratory:  Negative for cough and shortness of breath.   Cardiovascular:  Negative for chest pain.  Gastrointestinal:  Negative for abdominal pain.  Musculoskeletal:  Negative for back pain.   Hematological:  Does not bruise/bleed easily.  Psychiatric/Behavioral:  Negative for confusion.     MEDICAL HISTORY:  Past Medical History:  Diagnosis Date   Anemia    Arthritis    knees   Cerebral palsy (HCC)    Depression    Diabetes mellitus without complication (HCC)    Diverticulosis    Dysphagia    Eczema    GERD (gastroesophageal reflux disease)    Glaucoma    Hemorrhoids    Hyperlipidemia    Hypothyroidism    Seizures (HCC)    none in over 10 yrs    SURGICAL HISTORY: Past Surgical History:  Procedure Laterality Date   BREAST BIOPSY Right 03/22/2020   stereo bx, ribbon clip, path pending    CATARACT EXTRACTION W/PHACO Right 02/06/2016   Procedure: CATARACT EXTRACTION PHACO AND INTRAOCULAR LENS PLACEMENT (IOC);  Surgeon: Lockie Mola, MD;  Location: [facility];  Service: Ophthalmology;  Laterality: Right;   CATARACT EXTRACTION W/PHACO Left 03/05/2016   Procedure: CATARACT EXTRACTION PHACO AND INTRAOCULAR LENS PLACEMENT (IOC);  Surgeon: Lockie Mola, MD;  Location: [facility];  Service: Ophthalmology;  Laterality: Left;   CHOLECYSTECTOMY N/A 11/16/2015   Procedure: LAPAROSCOPIC CHOLECYSTECTOMY;  Surgeon: Tiney Rouge III, MD;  Location: [facility] ORS;  Service: General;  Laterality: N/A;  attempted cholangiogram   COLONOSCOPY WITH PROPOFOL N/A 12/25/2014   Procedure: COLONOSCOPY WITH PROPOFOL;  Surgeon: Wallace Cullens, MD;  Location: [facility] ENDOSCOPY;  Service: Gastroenterology;  Laterality: N/A;   COLONOSCOPY WITH PROPOFOL N/A 04/14/2019   Procedure: COLONOSCOPY WITH PROPOFOL;  Surgeon: Toledo, Boykin Nearing, MD;  Location: [facility] ENDOSCOPY;  Service: Gastroenterology;  Laterality: N/A;   ENDOSCOPIC RETROGRADE CHOLANGIOPANCREATOGRAPHY (ERCP) WITH PROPOFOL N/A 11/18/2015   Procedure: ENDOSCOPIC RETROGRADE CHOLANGIOPANCREATOGRAPHY (ERCP) WITH PROPOFOL;  Surgeon: Midge Minium, MD;  Location: [facility] ENDOSCOPY;  Service: Endoscopy;  Laterality: N/A;    ESOPHAGOGASTRODUODENOSCOPY (EGD) WITH PROPOFOL N/A 04/14/2019   Procedure: ESOPHAGOGASTRODUODENOSCOPY (EGD) WITH PROPOFOL;  Surgeon: Toledo, Boykin Nearing, MD;  Location: [facility] ENDOSCOPY;  Service: Gastroenterology;  Laterality: N/A;   EYE SURGERY     Cataract Surgery    HERNIA REPAIR      SOCIAL HISTORY: Social History   Socioeconomic History   Marital status: Single    Spouse name: Not on file   Number of children: Not on file   Years of education: Not on file   Highest education level: Not on file  Occupational History   Not on file  Tobacco Use   Smoking status: Former    Types: Cigarettes    Quit date: 06/20/1980    Years since quitting: 41.8   Smokeless tobacco: Never   Tobacco comments:    smoked socially in early 1980s (reports for 4 months)  Vaping Use   Vaping Use: Never used  Substance and Sexual Activity   Alcohol use: No   Drug use: No   Sexual activity: Not Currently  Other Topics Concern   Not on file  Social History Narrative   Not on file   Social Determinants of Health   Financial Resource Strain: Not on file  Food Insecurity: Not on file  Transportation Needs: Not on file  Physical  Activity: Not on file  Stress: Not on file  Social Connections: Not on file  Intimate Partner Violence: Not on file    FAMILY HISTORY: Family History  Problem Relation Age of Onset   High blood pressure Mother    Hyperlipidemia Mother    Diabetes Mellitus II Mother    Stroke Mother    Osteoporosis Mother    High blood pressure Father    Breast cancer Maternal Aunt    Stroke Maternal Grandfather    Diabetes Mellitus II Maternal Grandfather    Stroke Maternal Grandmother    Diabetes Mellitus II Maternal Grandmother     ALLERGIES:  is allergic to aminophylline, cinnamon, advil [ibuprofen], amoxicillin, latex, nsaids, penicillins, potassium, potassium-containing compounds, septra [sulfamethoxazole-trimethoprim], sulfa antibiotics, sulfasalazine, tape, and  theophyllines.  MEDICATIONS:  No current facility-administered medications for this visit.   Current Outpatient Medications  Medication Sig Dispense Refill   acetaminophen (TYLENOL) 325 MG tablet Take 325 mg by mouth every 6 (six) hours as needed.     alendronate (FOSAMAX) 70 MG tablet Take 70 mg by mouth once a week. Take with a full glass of water on an empty stomach.     ALPRAZolam (XANAX) 0.25 MG tablet Take 0.25 mg by mouth as needed for anxiety. Before pap smears     atorvastatin (LIPITOR) 40 MG tablet Take 40 mg by mouth daily.     calcium citrate-vitamin D (CITRACAL+D) 315-200 MG-UNIT per tablet Take 1 tablet by mouth 2 (two) times daily.     carbamazepine (TEGRETOL) 200 MG tablet Take 200 mg by mouth 3 (three) times daily.     Cholecalciferol 100 MCG (4000 UT) TABS Take 1,000 Units by mouth.     esomeprazole (NEXIUM) 40 MG capsule Take 40 mg by mouth daily at 12 noon.     famotidine (PEPCID) 20 MG tablet Take 1 tablet by mouth 2 (two) times daily.     hydroxypropyl methylcellulose / hypromellose (ISOPTO TEARS / GONIOVISC) 2.5 % ophthalmic solution 1 drop as needed for dry eyes.     levothyroxine (SYNTHROID) 112 MCG tablet Take 112 mcg by mouth every morning.     magnesium oxide (MAG-OX) 400 (240 Mg) MG tablet Take 1 tablet by mouth 3 (three) times daily.     magnesium oxide (MAG-OX) 400 MG tablet Take 1 tablet by mouth 3 (three) times daily.     naproxen (NAPROSYN) 375 MG tablet Take 375 mg by mouth 2 (two) times daily with a meal.     OLANZapine (ZYPREXA) 10 MG tablet Take 1 tablet (10 mg total) by mouth at bedtime. 90 tablet 0   OLANZapine (ZYPREXA) 5 MG tablet Take 1 tablet (5 mg total) by mouth daily. 90 tablet 0   PARoxetine (PAXIL) 20 MG tablet Take 1 tablet (20 mg total) by mouth daily. 30 tablet 1   potassium chloride 20 MEQ/15ML (10%) SOLN Take 10 mEq by mouth daily.     vitamin E 400 UNIT capsule Take 400 Units by mouth daily.     Wheat Dextrin (BENEFIBER PO) Take by  mouth.     Facility-Administered Medications Ordered in Other Visits  Medication Dose Route Frequency Provider Last Rate Last Admin   0.9 %  sodium chloride infusion  10 mL/hr Intravenous Once Merlyn Lot, MD       pantoprazole (PROTONIX) injection 40 mg  40 mg Intravenous Once Merlyn Lot, MD         PHYSICAL EXAMINATION: ECOG PERFORMANCE STATUS: 2 - Symptomatic, <50% confined  to bed Vitals:   04/18/22 0842  BP: (!) 116/57  Pulse: 88  Temp: (!) 97.2 F (36.2 C)  SpO2: 97%   Filed Weights   04/18/22 0842  Weight: 130 lb (59 kg)     Physical Exam Constitutional:      General: She is not in acute distress.    Comments: Patient sits in the wheelchair  HENT:     Head: Normocephalic and atraumatic.  Eyes:     General: No scleral icterus. Cardiovascular:     Rate and Rhythm: Normal rate and regular rhythm.     Heart sounds: Normal heart sounds.  Pulmonary:     Effort: Pulmonary effort is normal. No respiratory distress.     Breath sounds: No wheezing.  Abdominal:     General: Bowel sounds are normal. There is no distension.     Palpations: Abdomen is soft.  Musculoskeletal:        General: Deformity present.     Cervical back: Normal range of motion and neck supple.     Comments: Contracture of left upper extremity in the left lower extremity.  Skin:    General: Skin is warm and dry.     Coloration: Skin is pale.     Findings: No erythema or rash.  Neurological:     Mental Status: She is alert. Mental status is at baseline.  Psychiatric:        Mood and Affect: Mood normal.     LABORATORY DATA:  I have reviewed the data as listed    Latest Ref Rng & Units 04/18/2022    9:42 AM 04/18/2022    8:25 AM 03/17/2021    3:30 PM  CBC  WBC 4.0 - 10.5 K/uL 5.6  5.3  6.5   Hemoglobin 12.0 - 15.0 g/dL 7.1  6.9  13.0   Hematocrit 36.0 - 46.0 % 24.5  23.7  37.4   Platelets 150 - 400 K/uL 446  425  302       Latest Ref Rng & Units 04/18/2022    9:42 AM  03/17/2021    3:30 PM 06/14/2020   10:13 AM  CMP  Glucose 70 - 99 mg/dL 121  119    BUN 8 - 23 mg/dL 14  21    Creatinine 0.44 - 1.00 mg/dL 0.54  0.60  0.50   Sodium 135 - 145 mmol/L 135  138    Potassium 3.5 - 5.1 mmol/L 3.8  4.1    Chloride 98 - 111 mmol/L 101  107    CO2 22 - 32 mmol/L 26  25    Calcium 8.9 - 10.3 mg/dL 8.5  9.3    Total Protein 6.5 - 8.1 g/dL  7.6    Total Bilirubin 0.3 - 1.2 mg/dL  0.6    Alkaline Phos 38 - 126 U/L  83    AST 15 - 41 U/L  27    ALT 0 - 44 U/L  20        RADIOGRAPHIC STUDIES: I have personally reviewed the radiological images as listed and agreed with the findings in the report. No results found.

## 2022-04-18 NOTE — Assessment & Plan Note (Signed)
Patient presents to the ER from the cancer center for evaluation of symptomatic iron deficiency anemia with a hemoglobin of 6.9 Patient has had melena stools but denies having any hematemesis or hematochezia and is on NSAIDs at home concerning for possible gastritis resulted in chronic blood loss versus peptic ulcer disease We will transfuse 1 unit of packed RBC Continue Protonix 40 mg IV every 12 Consult gastroenterology

## 2022-04-18 NOTE — ED Triage Notes (Signed)
Patient to ED from cancer center for abnormal labs. Patient's hemoglobin dropped from 7 to 6.9- unable to get transfusion at [facility]. Patient dx with lung and breast cancer. Not currently getting treatment.

## 2022-04-18 NOTE — ED Triage Notes (Signed)
Pt to Ed via [place] from [place]. Pt sent over by MD due to HGB drop and last reading 6.9. MD states unable to do transfusion at cancer center. Pt brought in by husband.

## 2022-04-19 DIAGNOSIS — Z993 Dependence on wheelchair: Secondary | ICD-10-CM | POA: Diagnosis not present

## 2022-04-19 DIAGNOSIS — Z91040 Latex allergy status: Secondary | ICD-10-CM | POA: Diagnosis not present

## 2022-04-19 DIAGNOSIS — G809 Cerebral palsy, unspecified: Secondary | ICD-10-CM | POA: Diagnosis present

## 2022-04-19 DIAGNOSIS — Z87891 Personal history of nicotine dependence: Secondary | ICD-10-CM | POA: Diagnosis not present

## 2022-04-19 DIAGNOSIS — Z882 Allergy status to sulfonamides status: Secondary | ICD-10-CM | POA: Diagnosis not present

## 2022-04-19 DIAGNOSIS — E039 Hypothyroidism, unspecified: Secondary | ICD-10-CM | POA: Diagnosis present

## 2022-04-19 DIAGNOSIS — Z881 Allergy status to other antibiotic agents status: Secondary | ICD-10-CM | POA: Diagnosis not present

## 2022-04-19 DIAGNOSIS — Z91018 Allergy to other foods: Secondary | ICD-10-CM | POA: Diagnosis not present

## 2022-04-19 DIAGNOSIS — M1990 Unspecified osteoarthritis, unspecified site: Secondary | ICD-10-CM | POA: Diagnosis present

## 2022-04-19 DIAGNOSIS — K219 Gastro-esophageal reflux disease without esophagitis: Secondary | ICD-10-CM | POA: Diagnosis present

## 2022-04-19 DIAGNOSIS — F419 Anxiety disorder, unspecified: Secondary | ICD-10-CM | POA: Diagnosis present

## 2022-04-19 DIAGNOSIS — R918 Other nonspecific abnormal finding of lung field: Secondary | ICD-10-CM | POA: Diagnosis present

## 2022-04-19 DIAGNOSIS — Z880 Allergy status to penicillin: Secondary | ICD-10-CM | POA: Diagnosis not present

## 2022-04-19 DIAGNOSIS — D500 Iron deficiency anemia secondary to blood loss (chronic): Secondary | ICD-10-CM | POA: Diagnosis present

## 2022-04-19 DIAGNOSIS — K449 Diaphragmatic hernia without obstruction or gangrene: Secondary | ICD-10-CM | POA: Diagnosis present

## 2022-04-19 DIAGNOSIS — Z888 Allergy status to other drugs, medicaments and biological substances status: Secondary | ICD-10-CM | POA: Diagnosis not present

## 2022-04-19 DIAGNOSIS — R262 Difficulty in walking, not elsewhere classified: Secondary | ICD-10-CM | POA: Diagnosis present

## 2022-04-19 DIAGNOSIS — K2951 Unspecified chronic gastritis with bleeding: Secondary | ICD-10-CM | POA: Diagnosis present

## 2022-04-19 DIAGNOSIS — G40909 Epilepsy, unspecified, not intractable, without status epilepticus: Secondary | ICD-10-CM | POA: Diagnosis present

## 2022-04-19 DIAGNOSIS — T39315A Adverse effect of propionic acid derivatives, initial encounter: Secondary | ICD-10-CM | POA: Diagnosis present

## 2022-04-19 DIAGNOSIS — F32A Depression, unspecified: Secondary | ICD-10-CM | POA: Diagnosis present

## 2022-04-19 DIAGNOSIS — E785 Hyperlipidemia, unspecified: Secondary | ICD-10-CM | POA: Diagnosis present

## 2022-04-19 DIAGNOSIS — D649 Anemia, unspecified: Secondary | ICD-10-CM | POA: Diagnosis present

## 2022-04-19 DIAGNOSIS — E119 Type 2 diabetes mellitus without complications: Secondary | ICD-10-CM | POA: Diagnosis present

## 2022-04-19 DIAGNOSIS — Z886 Allergy status to analgesic agent status: Secondary | ICD-10-CM | POA: Diagnosis not present

## 2022-04-19 LAB — FOLATE: Folate: 30 ng/mL (ref >5.9)

## 2022-04-19 LAB — BASIC METABOLIC PANEL
Anion gap: 6 (ref 5–15)
BUN: 8 mg/dL (ref 8–23)
CO2: 26 mmol/L (ref 22–32)
Calcium: 8.2 mg/dL — ABNORMAL LOW (ref 8.9–10.3)
Chloride: 109 mmol/L (ref 98–111)
Creatinine, Ser: 0.35 mg/dL — ABNORMAL LOW (ref 0.44–1.00)
GFR, Estimated: >60 mL/min (ref >60)
Glucose, Bld: 106 mg/dL — ABNORMAL HIGH (ref 70–99)
Potassium: 4 mmol/L (ref 3.5–5.1)
Sodium: 141 mmol/L (ref 135–145)

## 2022-04-19 LAB — CBC
HCT: 27.4 % — ABNORMAL LOW (ref 36.0–46.0)
Hemoglobin: 8.4 g/dL — ABNORMAL LOW (ref 12.0–15.0)
MCH: 26.8 pg (ref 26.0–34.0)
MCHC: 30.7 g/dL (ref 30.0–36.0)
MCV: 87.5 fL (ref 80.0–100.0)
Platelets: 397 10*3/uL (ref 150–400)
RBC: 3.13 MIL/uL — ABNORMAL LOW (ref 3.87–5.11)
RDW: 14.6 % (ref 11.5–15.5)
WBC: 4.5 10*3/uL (ref 4.0–10.5)
nRBC: 0 % (ref 0.0–0.2)

## 2022-04-19 LAB — URINALYSIS, COMPLETE (UACMP) WITH MICROSCOPIC
Bilirubin Urine: NEGATIVE
Glucose, UA: NEGATIVE mg/dL
Ketones, ur: NEGATIVE mg/dL
Leukocytes,Ua: NEGATIVE
Nitrite: NEGATIVE
Protein, ur: NEGATIVE mg/dL
Specific Gravity, Urine: 1.005 (ref 1.005–1.030)
pH: 7 (ref 5.0–8.0)

## 2022-04-19 LAB — TYPE AND SCREEN
ABO/RH(D): A NEG
Antibody Screen: NEGATIVE
Unit division: 0

## 2022-04-19 LAB — BPAM RBC
Blood Product Expiration Date: 202402072359
ISSUE DATE / TIME: 202401191413
Unit Type and Rh: 600

## 2022-04-19 LAB — HIV ANTIBODY (ROUTINE TESTING W REFLEX): HIV Screen 4th Generation wRfx: NONREACTIVE

## 2022-04-19 MED ORDER — VITAMIN B-12 1000 MCG PO TABS
500.0000 ug | ORAL_TABLET | Freq: Every day | ORAL | Status: DC
  Administered 2022-04-19 – 2022-04-21 (×2): 500 ug via ORAL
  Filled 2022-04-19 (×3): qty 1

## 2022-04-19 MED ORDER — SODIUM CHLORIDE 0.9 % IV SOLN: INTRAVENOUS | Status: DC

## 2022-04-19 MED ORDER — SODIUM CHLORIDE 0.9 % IV SOLN
500.0000 mg | Freq: Once | INTRAVENOUS | Status: AC
  Administered 2022-04-19: 500 mg via INTRAVENOUS
  Filled 2022-04-19: qty 25

## 2022-04-19 MED ORDER — PEG 3350-KCL-NA BICARB-NACL 420 G PO SOLR
4000.0000 mL | Freq: Once | ORAL | Status: AC
  Administered 2022-04-19: 4000 mL via ORAL
  Filled 2022-04-19: qty 4000

## 2022-04-19 NOTE — Progress Notes (Signed)
[facility] Progress Note  Patient: Michele James    JKK:[id number]  DOA: 04/18/2022     Date of Service: the patient was seen and examined on 04/19/2022  Chief Complaint  Patient presents with   Abnormal Labs   Brief hospital course: : Michele James is a 67 y.o. 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 Patient had blood work done in the cancer center and was found to have a hemoglobin of 6.9g/dl.  Chart review shows that her blood count has been low over the past several weeks.  Chart review shows she had a hemoglobin of 12.2 in 04/23.  She states that her stools have been dark but she denies having any abdominal pain.  She denies having any hematemesis or hematochezia but is on naproxen twice daily.  She complains of occasional shortness of breath but denies having any chest pain, no dizziness, no lightheadedness or any falls. She denies having any fever, no chills, no cough, no urinary symptoms, no focal deficit, no blurred vision, no headache Abnormal labs include an iron of 20, ferritin of 6, hemoglobin 7.1 Stools were heme positive in the ER 1 unit of packed RBC was ordered in the ER Should be referred to observation status for further evaluation.    Assessment and Plan:  # Iron deficiency anemia due to chronic blood loss Patient presents to the ER from the cancer center for evaluation of symptomatic iron deficiency anemia with a hemoglobin of 6.9 Patient has had melena stools but denies having any hematemesis or hematochezia and is on NSAIDs at home concerning for possible gastritis resulted in chronic blood loss versus peptic ulcer disease S/p 1 unit of PRBC transfusion Hb 8.4, stable Continue Protonix 40 mg IV  every 12 Consult GI, recommended EGD and colonoscopy tomorrow a.m., patient was started on clear liquid diet, n.p.o. after midnight.   Iron deficiency, transferrin saturation 5% Started Venofer 300 mg x 1 dose given, short oral supplement on discharge. Vitamin B12 level 429, started oral supplement to prevent deficiency.  Folic acid level within normal range   Seizure ([place]) Placed on seizure precautions Continue Carbamazepine   Hypothyroidism (acquired) Continue Synthroid   Depression Continue Zyprexa and Paxil   Cerebral palsy (HCC) With left upper and lower extremity contractures limiting mobility Patient is wheelchair-bound and requires assistance with ADLs   Body mass index is 22.31 kg/m.  Interventions:       Diet: Clear liquid diet, n.p.o. after midnight for endoscopy. DVT Prophylaxis: SCD, pharmacological prophylaxis contraindicated due to symptomatic anemia    Advance goals of care discussion: Full code  Family Communication: family was present at bedside, at the time of interview.  The pt provided permission to discuss medical plan with the family. Opportunity was given to ask question and all questions were answered satisfactorily.   Disposition:  Pt is from Home, admitted with symptomatic anemia, GI consulted, scheduled for EGD and colonoscopy tomorrow a.m. which precludes a safe discharge. Discharge to Home, when Home when cleared by GI, may be in 1 to 2 days..  Subjective: No significant events overnight, patient was resting comfortably, denies any pain, no nausea vomiting or diarrhea.  Patient agreed for EGD and colonoscopy workup as per GI.   Physical Exam: General: NAD, lying comfortably  Appear in no distress, affect appropriate Eyes: PERRLA ENT: Oral Mucosa Clear, moist  Neck: no JVD,  Cardiovascular: S1 and S2 Present, no Murmur,  Respiratory: good respiratory effort, Bilateral Air entry equal and Decreased, no Crackles, no wheezes Abdomen: Bowel  Sound present, Soft and no tenderness,  Skin: no rashes Extremities: no Pedal edema, no calf tenderness Neurologic: Cerebral palsy, left-sided contracture, imbalanced gait. Gait not checked due to patient safety concerns  Vitals:   04/18/22 1601 04/18/22 1630 04/19/22 0030 04/19/22 0722  BP: (!) 108/93 124/86 (!) 112/51 (!) 117/58  Pulse: 81 79 79 72  Resp: 18 20 18 18   Temp: 98.1 F (36.7 C) 97.9 F (36.6 C) 97.6 F (36.4 C) 97.9 F (36.6 C)  TempSrc:  Oral    SpO2: 100%  98% 96%  Weight:      Height:        Intake/Output Summary (Last 24 hours) at 04/19/2022 1406 Last data filed at 04/19/2022 4782 Gross per 24 hour  Intake 1556.67 ml  Output 2900 ml  Net -1343.33 ml   Filed Weights   04/18/22 0937  Weight: 59 kg    Data Reviewed: I have personally reviewed and interpreted daily labs, tele strips, imagings as discussed above. I reviewed all nursing notes, pharmacy notes, vitals, pertinent old records I have discussed plan of care as described above with RN and patient/family.  CBC: Recent Labs  Lab 04/18/22 0825 04/18/22 0942 04/18/22 2142 04/19/22 0452  WBC 5.3 5.6  --  4.5  NEUTROABS 2.8 3.1  --   --   HGB 6.9* 7.1* 8.2* 8.4*  HCT 23.7* 24.5* 26.3* 27.4*  MCV 86.8 87.2  --  87.5  PLT 425* 446*  --  956   Basic Metabolic Panel: Recent Labs  Lab 04/18/22 0942 04/19/22 0452  NA 135 141  K 3.8 4.0  CL 101 109  CO2 26 26  GLUCOSE 121* 106*  BUN 14 8  CREATININE 0.54 0.35*  CALCIUM 8.5* 8.2*    Studies: No results found.  Scheduled Meds:  atorvastatin  40 mg Oral Daily   calcium-vitamin D  1 tablet Oral BID   carbamazepine  200 mg Oral TID   cholecalciferol  1,000 Units Oral Daily   vitamin B-12  500 mcg Oral Daily   levothyroxine  112 mcg Oral QAC breakfast   magnesium oxide  400 mg Oral TID   OLANZapine  10 mg Oral QHS   OLANZapine  5 mg Oral Daily   pantoprazole (PROTONIX) IV  40 mg Intravenous Q12H   PARoxetine  20 mg Oral Daily    polyethylene glycol-electrolytes  4,000 mL Oral Once   Continuous Infusions:  sodium chloride Stopped (04/18/22 1400)   sodium chloride 10 mL/hr at 04/19/22 1126   PRN Meds: acetaminophen **OR** acetaminophen, ondansetron **OR** ondansetron (ZOFRAN) IV, polyvinyl alcohol  Time spent: 50 minutes  Author: Val Riles. MD [facility] 04/19/2022 2:06 PM  To reach On-call, see care teams to locate the attending and reach out to them via [email or web address]. If 7PM-7AM, please contact night-coverage If you still have difficulty reaching the attending provider, please page the [facility] (Director on Call) for [facility] on amion for assistance.

## 2022-04-19 NOTE — H&P (Signed)
Jonathon Bellows , MD [address] [place], [place], [place], [address] [address] [place], [place], [place], [address] Phone: [phone]  Fax: [phone]  Consultation  Referring Provider:    Dr. Dwyane Dee Primary Care Physician:  Juluis Pitch, MD Primary Gastroenterologist: [facility] Reason for Consultation:     Iron deficiency anemia  Date of Admission:  04/18/2022 Date of Consultation:  04/19/2022         HPI:   Michele James is a 67 y.o. female previously seen at [facility] gastroenterology back in May 2021 for anemia from iron deficiency EGD in 2021 by Dr. Alice Reichert showed esophageal stenosis that was dilated hiatal hernia.  Colonoscopy showed diverticulosis.  Capsule endoscopy showed scattered AVMs in the small at this time nonbleeding healing jejunal ulceration plan at that point of time was to check celiac serology  Biopsies from the esophagus showed no abnormalities biopsies from the stomach showed mild chronic gastritis negative for H. pylori.  No celiac serology available on epic  Patient came into the hospital yesterday for evaluation of anemia she had blood work done at the cancer center.  Hemoglobin was 12.2 g in April 2023.  On admission hemoglobin was 6.9 g with an MCV of 86.  Creatinine of 0.35 folate which is normal at 30 ferritin was 6 Urine analysis in 12/18/2021 showed no abnormalities.  She follows with Dr. Tasia Catchings in oncology for lung nodules in her medication list naproxen as an active medication.   She denies any vaginal , nasal,rectal bleeding , no abdominal pain. Has been taking naprosym on and off for arthritis.  Past Medical History:  Diagnosis Date   Anemia    Arthritis    knees   Cerebral palsy ([place])    Depression    Diabetes mellitus without complication (HCC)    Diverticulosis    Dysphagia    Eczema    GERD (gastroesophageal reflux disease)    Glaucoma    Hemorrhoids    Hyperlipidemia    Hypothyroidism     Seizures ([place])    none in over 10 yrs    Past Surgical History:  Procedure Laterality Date   BREAST BIOPSY Right 03/22/2020   stereo bx, ribbon clip, path pending    CATARACT EXTRACTION W/PHACO Right 02/06/2016   Procedure: CATARACT EXTRACTION PHACO AND INTRAOCULAR LENS PLACEMENT ([place]);  Surgeon: Leandrew Koyanagi, MD;  Location: [place];  Service: Ophthalmology;  Laterality: Right;   CATARACT EXTRACTION W/PHACO Left 03/05/2016   Procedure: CATARACT EXTRACTION PHACO AND INTRAOCULAR LENS PLACEMENT ([facility]);  Surgeon: Leandrew Koyanagi, MD;  Location: [place];  Service: Ophthalmology;  Laterality: Left;   CHOLECYSTECTOMY N/A 11/16/2015   Procedure: LAPAROSCOPIC CHOLECYSTECTOMY;  Surgeon: Dia Crawford III, MD;  Location: [facility];  Service: General;  Laterality: N/A;  attempted cholangiogram   COLONOSCOPY WITH PROPOFOL N/A 12/25/2014   Procedure: COLONOSCOPY WITH PROPOFOL;  Surgeon: Hulen Luster, MD;  Location: [facility] END[facility]COPY;  Service: Gastroenterology;  Laterality: N/A;   COLONOSCOPY WITH PROPOFOL N/A 04/14/2019   Procedure: COLONOSCOPY WITH PROPOFOL;  Surgeon: Toledo, Benay Pike, MD;  Location: [facility] ENDOSCOPY;  Service: Gastroenterology;  Laterality: N/A;   ENDOSCOPIC RETROGRADE CHOLANGIOPANCREATOGRAPHY (ERCP) WITH PROPOFOL N/A 11/18/2015   Procedure: ENDOSCOPIC RETROGRADE CHOLANGIOPANCREATOGRAPHY (ERCP) WITH PROPOFOL;  Surgeon: Lucilla Lame, MD;  Location: [facility] ENDOSCOPY;  Service: Endoscopy;  Laterality: N/A;   ESOPHAGOGASTRODUODENOSCOPY (EGD) WITH PROPOFOL N/A 04/14/2019   Procedure: ESOPHAGOGASTRODUODENOSCOPY (EGD) WITH PROPOFOL;  Surgeon: Alice Reichert, Benay Pike, MD;  Location:  [facility] ENDOSCOPY;  Service: Gastroenterology;  Laterality: N/A;   EYE SURGERY     Cataract Surgery    HERNIA REPAIR      Prior to Admission medications   Medication Sig Start Date End Date Taking? Authorizing Provider  acetaminophen (TYLENOL) 325 MG tablet Take 325 mg by mouth every 6 (six) hours as needed.    Yes [provider]  alendronate (FOSAMAX) 70 MG tablet Take 70 mg by mouth once a week. Take with a full glass of water on an empty stomach.   Yes [provider]  ALPRAZolam (XANAX) 0.25 MG tablet Take 0.25 mg by mouth as needed for anxiety. Before pap smears   Yes [provider]  atorvastatin (LIPITOR) 40 MG tablet Take 40 mg by mouth daily.   Yes [provider]  calcium citrate-vitamin D (CITRACAL+D) 315-200 MG-UNIT per tablet Take 1 tablet by mouth 2 (two) times daily.   Yes [provider]  carbamazepine (TEGRETOL) 200 MG tablet Take 200 mg by mouth 3 (three) times daily.   Yes [provider]  Cholecalciferol 100 MCG (4000 UT) TABS Take 1,000 Units by mouth.   Yes [provider]  esomeprazole (NEXIUM) 40 MG capsule Take 40 mg by mouth daily at 12 noon.   Yes [provider]  famotidine (PEPCID) 20 MG tablet Take 1 tablet by mouth 2 (two) times daily. 04/23/20  Yes [provider]  hydroxypropyl methylcellulose / hypromellose (ISOPTO TEARS / GONIOVISC) 2.5 % ophthalmic solution 1 drop as needed for dry eyes.   Yes [provider]  levothyroxine (SYNTHROID) 112 MCG tablet Take 112 mcg by mouth every morning. 03/04/21  Yes [provider]  magnesium oxide (MAG-OX) 400 MG tablet Take 1 tablet by mouth 3 (three) times daily. 01/13/22  Yes [provider]  naproxen (NAPROSYN) 375 MG tablet Take 375 mg by mouth 2 (two) times daily with a meal.   Yes [provider]  OLANZapine (ZYPREXA) 10 MG tablet Take 1 tablet (10 mg total) by mouth at bedtime. 04/17/22 07/16/22 Yes Hisada, Barbee Cough, MD  OLANZapine (ZYPREXA) 5 MG tablet Take 1 tablet (5 mg total) by mouth daily. 03/20/22 06/18/22 Yes Hisada, Barbee Cough, MD  PARoxetine (PAXIL) 20 MG tablet Take 1 tablet (20 mg total) by mouth daily. 11/04/21 04/18/22 Yes Hisada, Barbee Cough, MD  potassium chloride 20 MEQ/15ML (10%) SOLN Take 10 mEq by mouth daily.    Yes [provider]  vitamin E 400 UNIT capsule Take 400 Units by mouth daily.   Yes [provider]  magnesium oxide (MAG-OX) 400 (240 Mg) MG tablet Take 1 tablet by mouth 3 (three) times daily. 02/18/21   [provider]  Wheat Dextrin (BENEFIBER PO) Take by mouth. Patient not taking: Reported on 04/18/2022    [provider]    Family History  Problem Relation Age of Onset   High blood pressure Mother    Hyperlipidemia Mother    Diabetes Mellitus II Mother    Stroke Mother    Osteoporosis Mother    High blood pressure Father    Breast cancer Maternal Aunt    Stroke Maternal Grandfather    Diabetes Mellitus II Maternal Grandfather    Stroke Maternal Grandmother    Diabetes Mellitus II Maternal Grandmother      Social History   Tobacco Use   Smoking status: Former    Types: Cigarettes    Quit date: 06/20/1980    Years since quitting: 41.8   Smokeless  tobacco: Never   Tobacco comments:    smoked socially in early 1980s (reports for 4 months)  Vaping Use   Vaping Use: Never used  Substance Use Topics   Alcohol use: No   Drug use: No    Allergies as of 04/18/2022 - Review Complete 04/18/2022  Allergen Reaction Noted   Aminophylline Other (See Comments) 11/15/2015   Cinnamon Other (See Comments) 12/23/2017   Advil [ibuprofen] Itching and Rash 08/08/2013   Amoxicillin Rash 11/15/2015   Latex Rash 04/28/2016   Nsaids Rash 11/15/2015   Penicillins Nausea And Vomiting 01/02/2012   Potassium Nausea And Vomiting and Nausea Only    Potassium-containing compounds Nausea And Vomiting 11/15/2015   Septra [sulfamethoxazole-trimethoprim] Rash 11/15/2015   Sulfa antibiotics Rash 11/15/2015   Sulfasalazine Rash 11/15/2015   Tape Rash 02/04/2016   Theophyllines Other (See Comments) 11/15/2015    Review of Systems:    All systems reviewed and negative except where noted in HPI.   Physical Exam:  Vital signs in last 24 hours: Temp:  [97.6 F  (36.4 C)-98.3 F (36.8 C)] 97.9 F (36.6 C) (01/20 0722) Pulse Rate:  [72-92] 72 (01/20 0722) Resp:  [17-20] 18 (01/20 0722) BP: (106-153)/(50-93) 117/58 (01/20 0722) SpO2:  [96 %-100 %] 96 % (01/20 0722) Weight:  [59 kg] 59 kg (01/19 0937) Last BM Date : 04/18/22 General:   Pleasant, cooperative in NAD Head:  Normocephalic and atraumatic. Eyes:   No icterus.   Conjunctiva pink. PERRLA. Ears:  Normal auditory acuity. Neck:  Supple; no masses or thyroidomegaly Lungs: Respirations even and unlabored. Lungs clear to auscultation bilaterally.   No wheezes, crackles, or rhonchi.  Heart:  Regular rate and rhythm;  Without murmur, clicks, rubs or gallops Abdomen:  Soft, nondistended, nontender. Normal bowel sounds. No appreciable masses or hepatomegaly.  No rebound or guarding.  Neurologic:  Alert and oriented x3;  grossly normal neurologically.flexed wrists Psych:  Alert and cooperative. Normal affect.  LAB RESULTS: Recent Labs    04/18/22 0825 04/18/22 0942 04/18/22 2142 04/19/22 0452  WBC 5.3 5.6  --  4.5  HGB 6.9* 7.1* 8.2* 8.4*  HCT 23.7* 24.5* 26.3* 27.4*  PLT 425* 446*  --  397   BMET Recent Labs    04/18/22 0942 04/19/22 0452  NA 135 141  K 3.8 4.0  CL 101 109  CO2 26 26  GLUCOSE 121* 106*  BUN 14 8  CREATININE 0.54 0.35*  CALCIUM 8.5* 8.2*   LFT No results for input(s): "PROT", "ALBUMIN", "AST", "ALT", "ALKPHOS", "BILITOT", "BILIDIR", "IBILI" in the last 72 hours. PT/INR No results for input(s): "LABPROT", "INR" in the last 72 hours.  STUDIES: No results found.    Impression / Plan:   Michele James is a 67 y.o. y/o female with a longstanding history of iron deficiency anemia AVMs in the small bowel who follows with Dr. Alice Reichert as an outpatient lost to follow-up follows up with oncology sent to the emergency room for acute drop in hemoglobin from baseline of 12 g about a year back to 6.9 g.  Labs suggest severe iron deficiency.  Naproxen is listed as one of  her active medications on her chart.  Differentials include bleeding from ulcers related to NSAID use versus AVMs in the small bowel.  Plan 1.  Urine analysis, celiac serology in view of prior ulceration of the small bowel 2.  IV PPI 3.  EGD and colonoscopy and if negative will require repeat capsule study of small bowel  AVMs in the small bowel she will require further follow-up with her primary GI Dr. Alice Reichert for ablation of the same 4.  IV iron while in hospital as well as following discharge with Dr. Tasia Catchings 5. Stop naprosyn use   I have discussed alternative options, risks & benefits,  which include, but are not limited to, bleeding, infection, perforation,respiratory complication & drug reaction.  The patient agrees with this plan & written consent will be obtained.     Thank you for involving me in the care of this patient.      LOS: 0 days   Jonathon Bellows, MD  04/19/2022, 9:06 AM

## 2022-04-19 NOTE — Plan of Care (Signed)
  Problem: Clinical Measurements: Goal: Ability to maintain clinical measurements within normal limits will improve Outcome: Progressing   Problem: Activity: Goal: Risk for activity intolerance will decrease Outcome: Progressing   Problem: Safety: Goal: Ability to remain free from injury will improve Outcome: Progressing   

## 2022-04-19 NOTE — Plan of Care (Signed)
  Problem: Education: Goal: Knowledge of General Education information will improve Description Including pain rating scale, medication(s)/side effects and non-pharmacologic comfort measures Outcome: Progressing   Problem: Health Behavior/Discharge Planning: Goal: Ability to manage health-related needs will improve Outcome: Progressing   Problem: Nutrition: Goal: Adequate nutrition will be maintained Outcome: Progressing   Problem: Coping: Goal: Level of anxiety will decrease Outcome: Progressing   Problem: Elimination: Goal: Will not experience complications related to bowel motility Outcome: Progressing   Problem: Safety: Goal: Ability to remain free from injury will improve Outcome: Progressing   Problem: Skin Integrity: Goal: Risk for impaired skin integrity will decrease Outcome: Progressing   

## 2022-04-20 ENCOUNTER — Inpatient Hospital Stay: Payer: Medicare Other | Admitting: Anesthesiology

## 2022-04-20 ENCOUNTER — Encounter
Admission: EM | Disposition: A | Discharge status: Home / Self Care | Payer: Self-pay | Source: Home / Self Care | Attending: Student

## 2022-04-20 ENCOUNTER — Encounter: Payer: Self-pay | Admitting: Internal Medicine

## 2022-04-20 DIAGNOSIS — D500 Iron deficiency anemia secondary to blood loss (chronic): Secondary | ICD-10-CM | POA: Diagnosis not present

## 2022-04-20 HISTORY — PX: COLONOSCOPY WITH PROPOFOL: SHX5780

## 2022-04-20 HISTORY — PX: ESOPHAGOGASTRODUODENOSCOPY (EGD) WITH PROPOFOL: SHX5813

## 2022-04-20 LAB — BASIC METABOLIC PANEL WITH GFR
Anion gap: 7 (ref 5–15)
BUN: 5 mg/dL — ABNORMAL LOW (ref 8–23)
CO2: 23 mmol/L (ref 22–32)
Calcium: 8.2 mg/dL — ABNORMAL LOW (ref 8.9–10.3)
Chloride: 111 mmol/L (ref 98–111)
Creatinine, Ser: 0.38 mg/dL — ABNORMAL LOW (ref 0.44–1.00)
GFR, Estimated: >60 mL/min
Glucose, Bld: 101 mg/dL — ABNORMAL HIGH (ref 70–99)
Potassium: 3.6 mmol/L (ref 3.5–5.1)
Sodium: 141 mmol/L (ref 135–145)

## 2022-04-20 LAB — CBC
HCT: 26.4 % — ABNORMAL LOW (ref 36.0–46.0)
Hemoglobin: 8.1 g/dL — ABNORMAL LOW (ref 12.0–15.0)
MCH: 27.1 pg (ref 26.0–34.0)
MCHC: 30.7 g/dL (ref 30.0–36.0)
MCV: 88.3 fL (ref 80.0–100.0)
Platelets: 398 10*3/uL (ref 150–400)
RBC: 2.99 MIL/uL — ABNORMAL LOW (ref 3.87–5.11)
RDW: 14.8 % (ref 11.5–15.5)
WBC: 8.3 10*3/uL (ref 4.0–10.5)
nRBC: 0 % (ref 0.0–0.2)

## 2022-04-20 LAB — MAGNESIUM: Magnesium: 2 mg/dL (ref 1.7–2.4)

## 2022-04-20 LAB — PHOSPHORUS: Phosphorus: 2.9 mg/dL (ref 2.5–4.6)

## 2022-04-20 SURGERY — ESOPHAGOGASTRODUODENOSCOPY (EGD) WITH PROPOFOL
Anesthesia: General

## 2022-04-20 MED ORDER — PROPOFOL 1000 MG/100ML IV EMUL
INTRAVENOUS | Status: AC
  Filled 2022-04-20: qty 100

## 2022-04-20 MED ORDER — SODIUM CHLORIDE 0.9 % IV SOLN
200.0000 mg | Freq: Once | INTRAVENOUS | Status: AC
  Administered 2022-04-21: 200 mg via INTRAVENOUS
  Filled 2022-04-20: qty 200

## 2022-04-20 MED ORDER — LIDOCAINE 2% (20 MG/ML) 5 ML SYRINGE
INTRAMUSCULAR | Status: DC | PRN
  Administered 2022-04-20: 60 mg via INTRAVENOUS

## 2022-04-20 MED ORDER — PROPOFOL 500 MG/50ML IV EMUL
INTRAVENOUS | Status: DC | PRN
  Administered 2022-04-20: 140 ug/kg/min via INTRAVENOUS

## 2022-04-20 MED ORDER — SODIUM CHLORIDE 0.9 % IV SOLN
300.0000 mg | Freq: Once | INTRAVENOUS | Status: AC
  Administered 2022-04-20: 300 mg via INTRAVENOUS
  Filled 2022-04-20: qty 300

## 2022-04-20 MED ORDER — PROPOFOL 10 MG/ML IV BOLUS
INTRAVENOUS | Status: DC | PRN
  Administered 2022-04-20: 70 mg via INTRAVENOUS

## 2022-04-20 MED ORDER — SODIUM CHLORIDE 0.9 % IV SOLN: INTRAVENOUS | Status: DC

## 2022-04-20 NOTE — Op Note (Signed)
[facility] Gastroenterology Patient Name: Michele James Procedure Date: 04/20/2022 11:27 AM MRN: [id number] Account #: [id number] Date of Birth: 1955-07-27 Admit Type: Inpatient Age: 67 Room: [facility] ENDO ROOM 4 Gender: Female Note Status: Finalized Instrument Name: Peds Colonoscope [id number] Procedure:             Small bowel enteroscopy Indications:           Iron deficiency anemia Providers:             Jonathon Bellows MD, MD Referring MD:          Youlanda Roys. Lovie Macadamia, MD (Referring MD) Medicines:             Monitored Anesthesia Care Complications:         No immediate complications. Procedure:             Pre-Anesthesia Assessment:                        - Prior to the procedure, a History and Physical was                         performed, and patient medications, allergies and                         sensitivities were reviewed. The patient's tolerance                         of previous anesthesia was reviewed.                        - The risks and benefits of the procedure and the                         sedation options and risks were discussed with the                         patient. All questions were answered and informed                         consent was obtained.                        - ASA Grade Assessment: II - A patient with mild                         systemic disease.                        After obtaining informed consent, the endoscope was                         passed under direct vision. Throughout the procedure,                         the patient's blood pressure, pulse, and oxygen                         saturations were monitored continuously. The                         Colonoscope  was introduced through the mouth, and                         advanced to the proximal jejunum. The small bowel                         enteroscopy was accomplished with ease. The patient                         tolerated the procedure well. Findings:       The esophagus was normal.      A large hiatal hernia was present.      The examined duodenum was normal.      There was no evidence of significant pathology in the proximal jejunum.       Biopsies for histology were taken with a cold forceps for evaluation of       celiac disease. Impression:            - Normal esophagus.                        - Large hiatal hernia.                        - Normal examined duodenum.                        - The examined portion of the jejunum was normal.                         Biopsied. Recommendation:        - Await pathology results.                        - Perform a colonoscopy today. Procedure Code(s):     --- Professional ---                        [phone], Small intestinal endoscopy, enteroscopy beyond                         second portion of duodenum, not including ileum; with                         biopsy, single or multiple CPT copyright 2022 American Medical Association. All rights reserved. The codes documented in this report are preliminary and upon coder review may  be revised to meet current compliance requirements. Jonathon Bellows, MD Jonathon Bellows MD, MD 04/20/2022 11:46:36 AM This report has been signed electronically. Number of Addenda: 0 Note Initiated On: 04/20/2022 11:27 AM Estimated Blood Loss:  Estimated blood loss: none.      [facility]

## 2022-04-20 NOTE — Op Note (Signed)
[facility] Patient Name: Michele James Procedure Date: 04/20/2022 11:26 AM MRN: [id number] Account #: [id number] Date of Birth: Jan 09, 1956 Admit Type: Inpatient Age: 67 Room: [facility] ENDO ROOM 4 Gender: Female Note Status: Finalized Instrument Name: Peds Colonoscope [id number] Procedure:             Colonoscopy Indications:           Iron deficiency anemia Providers:             Wyline Mood MD, MD Referring MD:          Teena Irani. Terance Hart, MD (Referring MD) Medicines:             Monitored Anesthesia Care Complications:         No immediate complications. Procedure:             Pre-Anesthesia Assessment:                        - Prior to the procedure, a History and Physical was                         performed, and patient medications, allergies and                         sensitivities were reviewed. The patient's tolerance                         of previous anesthesia was reviewed.                        - The risks and benefits of the procedure and the                         sedation options and risks were discussed with the                         patient. All questions were answered and informed                         consent was obtained.                        - ASA Grade Assessment: II - A patient with mild                         systemic disease.                        After obtaining informed consent, the colonoscope was                         passed under direct vision. Throughout the procedure,                         the patient's blood pressure, pulse, and oxygen                         saturations were monitored continuously. The                         Colonoscope was introduced  through the anus and                         advanced to the the cecum, identified by the                         appendiceal orifice. The colonoscopy was performed                         with ease. The patient tolerated the procedure well.                          The quality of the bowel preparation was                         unsatisfactory. The ileocecal valve, appendiceal                         orifice, and rectum were photographed. Findings:      The perianal and digital rectal examinations were normal.      A large amount of semi-liquid stool was found in the entire colon,       interfering with visualization. Impression:            - Preparation of the colon was unsatisfactory.                        - Stool in the entire examined colon.                        - No specimens collected. Recommendation:        - Return patient to hospital ward for ongoing care.                        - Advance diet as tolerated.                        - No blood or black stool seen , repeat colonoscopy as                         outpatient with Dr Alice Reichert and if negative considewr                         repeating capsule study of small bowel .                        IV iron as outpatient                        GI will sign off Procedure Code(s):     --- Professional ---                        [phone], Colonoscopy, flexible; diagnostic, including                         collection of specimen(s) by brushing or washing, when                         performed (separate procedure) Diagnosis Code(s):     ---  Professional ---                        D50.9, Iron deficiency anemia, unspecified CPT copyright 2022 [employer]. All rights reserved. The codes documented in this report are preliminary and upon coder review may  be revised to meet current compliance requirements. Jonathon Bellows, MD Jonathon Bellows MD, MD 04/20/2022 12:00:55 PM This report has been signed electronically. Number of Addenda: 0 Note Initiated On: 04/20/2022 11:26 AM Scope Withdrawal Time: 0 hours 2 minutes 54 seconds  Total Procedure Duration: 0 hours 10 minutes 12 seconds  Estimated Blood Loss:  Estimated blood loss: none.      [name]

## 2022-04-20 NOTE — Anesthesia Postprocedure Evaluation (Signed)
Anesthesia Post Note  Patient: Michele James  Procedure(s) Performed: ESOPHAGOGASTRODUODENOSCOPY (EGD) WITH PROPOFOL COLONOSCOPY WITH PROPOFOL  Patient location during evaluation: Endoscopy Anesthesia Type: General Level of consciousness: awake and alert Pain management: pain level controlled Vital Signs Assessment: post-procedure vital signs reviewed and stable Respiratory status: spontaneous breathing, nonlabored ventilation, respiratory function stable and patient connected to nasal cannula oxygen Cardiovascular status: blood pressure returned to baseline and stable Postop Assessment: no apparent nausea or vomiting Anesthetic complications: no   No notable events documented.   Last Vitals:  Vitals:   04/20/22 1230 04/20/22 1301  BP: 128/76 118/61  Pulse:  88  Resp:  16  Temp:  36.5 C  SpO2: 97% 99%    Last Pain:  Vitals:   04/20/22 1230  TempSrc:   PainSc: 0-No pain                 Arita Miss

## 2022-04-20 NOTE — Plan of Care (Signed)
  Problem: Education: °Goal: Knowledge of General Education information will improve °Description: Including pain rating scale, medication(s)/side effects and non-pharmacologic comfort measures °Outcome: Progressing °  °Problem: Health Behavior/Discharge Planning: °Goal: Ability to manage health-related needs will improve °Outcome: Progressing °  °Problem: Clinical Measurements: °Goal: Ability to maintain clinical measurements within normal limits will improve °Outcome: Progressing °Goal: Will remain free from infection °Outcome: Progressing °Goal: Diagnostic test results will improve °Outcome: Progressing °Goal: Respiratory complications will improve °Outcome: Progressing °Goal: Cardiovascular complication will be avoided °Outcome: Progressing °  °Problem: Nutrition: °Goal: Adequate nutrition will be maintained °Outcome: Progressing °  °Problem: Coping: °Goal: Level of anxiety will decrease °Outcome: Progressing °  °Problem: Elimination: °Goal: Will not experience complications related to bowel motility °Outcome: Progressing °Goal: Will not experience complications related to urinary retention °Outcome: Progressing °  °Problem: Safety: °Goal: Ability to remain free from injury will improve °Outcome: Progressing °  °Problem: Skin Integrity: °Goal: Risk for impaired skin integrity will decrease °Outcome: Progressing °  °

## 2022-04-20 NOTE — Transfer of Care (Signed)
Immediate Anesthesia Transfer of Care Note  Patient: Michele James  Procedure(s) Performed: ESOPHAGOGASTRODUODENOSCOPY (EGD) WITH PROPOFOL COLONOSCOPY WITH PROPOFOL  Patient Location: Endoscopy Unit  Anesthesia Type:General  Level of Consciousness: awake, alert , and oriented  Airway & Oxygen Therapy: Patient Spontanous Breathing  Post-op Assessment: Report given to RN and Post -op Vital signs reviewed and stable  Post vital signs: Reviewed and stable  Last Vitals:  Vitals Value Taken Time  BP 121/56   Temp    Pulse 94 04/20/22 1210  Resp 18 04/20/22 1210  SpO2 96 % 04/20/22 1210  Vitals shown include unvalidated device data.  Last Pain:  Vitals:   04/20/22 1113  TempSrc: Temporal  PainSc: 0-No pain         Complications: No notable events documented.

## 2022-04-20 NOTE — Plan of Care (Signed)

## 2022-04-20 NOTE — Anesthesia Preprocedure Evaluation (Signed)
Anesthesia Evaluation  Patient identified by MRN, date of birth, ID band Patient awake    Reviewed: Allergy & Precautions, H&P , NPO status , Patient's Chart, lab work & pertinent test results  History of Anesthesia Complications Negative for: history of anesthetic complications  Airway Mallampati: III  TM Distance: <3 FB Neck ROM: full    Dental  (+) Chipped,    Pulmonary neg pulmonary ROS, neg sleep apnea, neg COPD, Patient abstained from smoking.Not current smoker, former smoker   Pulmonary exam normal breath sounds clear to auscultation       Cardiovascular Exercise Tolerance: Good METS(-) hypertension(-) CAD and (-) Past MI negative cardio ROS Normal cardiovascular exam(-) dysrhythmias  Rhythm:Regular Rate:Normal - Systolic murmurs    Neuro/Psych Seizures - (no seizures in 10 years),  PSYCHIATRIC DISORDERS  Depression       GI/Hepatic ,GERD  ,,(+)     (-) substance abuse    Endo/Other  diabetesHypothyroidism    Renal/GU negative Renal ROS  negative genitourinary   Musculoskeletal  (+) Arthritis ,    Abdominal   Peds  Hematology   Anesthesia Other Findings Past Medical History: No date: Arthritis     Comment:  knees No date: Cerebral palsy (HCC) No date: Depression No date: Diabetes mellitus without complication (HCC) No date: Diverticulosis No date: Dysphagia No date: Eczema No date: GERD (gastroesophageal reflux disease) No date: Glaucoma No date: Hemorrhoids No date: Hyperlipidemia No date: Hypothyroidism No date: Seizures ([place])     Comment:  none in over 10 yrs  Reproductive/Obstetrics                              Anesthesia Physical Anesthesia Plan  ASA: 3  Anesthesia Plan: General   Post-op Pain Management: Minimal or no pain anticipated   Induction: Intravenous  PONV Risk Score and Plan: 3 and Propofol infusion  Airway Management Planned: Nasal  Cannula  Additional Equipment: None  Intra-op Plan:   Post-operative Plan:   Informed Consent: I have reviewed the patients History and Physical, chart, labs and discussed the procedure including the risks, benefits and alternatives for the proposed anesthesia with the patient or authorized representative who has indicated his/her understanding and acceptance.     Dental advisory given  Plan Discussed with: CRNA and Surgeon  Anesthesia Plan Comments: (Discussed risks of anesthesia with patient, including possibility of difficulty with spontaneous ventilation under anesthesia necessitating airway intervention, PONV, and rare risks such as cardiac or respiratory or neurological events, and allergic reactions. Discussed the role of CRNA in patient's perioperative care. Patient understands.)         Anesthesia Quick Evaluation

## 2022-04-20 NOTE — Progress Notes (Signed)
[facility] Progress Note  Patient: Michele James    ZOX:[id number]  DOA: 04/18/2022     Date of Service: the patient was seen and examined on 04/20/2022  Chief Complaint  Patient presents with   Abnormal Labs   Brief hospital course: : Michele James is a 67 y.o. 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 Patient had blood work done in the cancer center and was found to have a hemoglobin of 6.9g/dl.  Chart review shows that her blood count has been low over the past several weeks.  Chart review shows she had a hemoglobin of 12.2 in 04/23.  She states that her stools have been dark but she denies having any abdominal pain.  She denies having any hematemesis or hematochezia but is on naproxen twice daily.  She complains of occasional shortness of breath but denies having any chest pain, no dizziness, no lightheadedness or any falls. She denies having any fever, no chills, no cough, no urinary symptoms, no focal deficit, no blurred vision, no headache Abnormal labs include an iron of 20, ferritin of 6, hemoglobin 7.1 Stools were heme positive in the ER 1 unit of packed RBC was ordered in the ER Should be referred to observation status for further evaluation.    Assessment and Plan:  # Iron deficiency anemia due to chronic blood loss Patient presents to the ER from the cancer center for evaluation of symptomatic iron deficiency anemia with a hemoglobin of 6.9 Patient has had melena stools but denies having any hematemesis or hematochezia and is on NSAIDs at home concerning for possible gastritis resulted in chronic blood loss versus peptic ulcer disease S/p 1 unit of PRBC transfusion Hb 8.4, stable Continue Protonix 40 mg IV  every 12 Consult GI, s/p EGD and colonoscopy, no source of bleeding was identified.  Patient was cleared by GI to follow as an outpatient, patient may need VCE  Follow-up with GI for biopsy report.  Iron deficiency, transferrin saturation 5% S/p Venofer 500 mg x 1 dose and Venofer 300 mg x 1 dose given,  Venofer 200 mg IV one-time dose ordered for tomorrow a.m. short oral supplement on discharge. Vitamin B12 level 429, started oral supplement to prevent deficiency.  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  Interventions:       Diet: Clear liquid diet, n.p.o. after midnight for endoscopy. DVT Prophylaxis: SCD, pharmacological prophylaxis contraindicated due to symptomatic anemia    Advance goals of care discussion: Full code  Family Communication: family was present at bedside, at the time of interview.  The pt provided permission to discuss medical plan with the family. Opportunity was given to ask question and all questions were answered satisfactorily.   Disposition:  Pt is from Home, admitted with symptomatic anemia, GI consulted s/p EGD and colonoscopy done today, patient will receive IV iron infusion today and tomorrow.  Plan is to discharge home tomorrow.  Discharge to Home, tomorrow a.m.   Subjective: No  significant events overnight, patient did mention that she did finish GI prep and she was asking about the time of the procedure.  I informed her that GI will come by and inform her.  Patient denies any active issues.    Physical Exam: General: NAD, lying comfortably Appear in no distress, affect appropriate Eyes: PERRLA ENT:  Oral Mucosa Clear, moist  Neck: no JVD,  Cardiovascular: S1 and S2 Present, no Murmur,  Respiratory: good respiratory effort, Bilateral Air entry equal and Decreased, no Crackles, no wheezes Abdomen: Bowel Sound present, Soft and no tenderness,  Skin: no rashes Extremities: no Pedal edema, no calf tenderness Neurologic: Cerebral palsy, left-sided contracture, imbalanced gait. Gait not checked due to patient safety concerns  Vitals:   04/20/22 1210 04/20/22 1220 04/20/22 1230 04/20/22 1301  BP: (!) 121/53 128/78 128/76 118/61  Pulse:  90  88  Resp:    16  Temp:    97.7 F (36.5 C)  TempSrc:      SpO2:  97% 97% 99%  Weight:      Height:        Intake/Output Summary (Last 24 hours) at 04/20/2022 1428 Last data filed at 04/20/2022 0700 Gross per 24 hour  Intake --  Output 650 ml  Net -650 ml   Filed Weights   04/18/22 0937  Weight: 59 kg    Data Reviewed: I have personally reviewed and interpreted daily labs, tele strips, imagings as discussed above. I reviewed all nursing notes, pharmacy notes, vitals, pertinent old records I have discussed plan of care as described above with RN and patient/family.  CBC: Recent Labs  Lab 04/18/22 0825 04/18/22 0942 04/18/22 2142 04/19/22 0452 04/20/22 0440  WBC 5.3 5.6  --  4.5 8.3  NEUTROABS 2.8 3.1  --   --   --   HGB 6.9* 7.1* 8.2* 8.4* 8.1*  HCT 23.7* 24.5* 26.3* 27.4* 26.4*  MCV 86.8 87.2  --  87.5 88.3  PLT 425* 446*  --  397 662   Basic Metabolic Panel: Recent Labs  Lab 04/18/22 0942 04/19/22 0452 04/20/22 0440  NA 135 141 141  K 3.8 4.0 3.6  CL 101 109 111  CO2 26 26 23   GLUCOSE 121* 106* 101*  BUN 14 8 5*  CREATININE 0.54 0.35* 0.38*  CALCIUM 8.5* 8.2* 8.2*  MG  --   --  2.0  PHOS  --   --  2.9    Studies: No results found.  Scheduled Meds:  atorvastatin  40 mg Oral Daily   calcium-vitamin D  1 tablet Oral BID   carbamazepine  200 mg Oral TID   cholecalciferol  1,000 Units Oral Daily   vitamin B-12   500 mcg Oral Daily   levothyroxine  112 mcg Oral QAC breakfast   magnesium oxide  400 mg Oral TID   OLANZapine  10 mg Oral QHS   OLANZapine  5 mg Oral Daily   pantoprazole (PROTONIX) IV  40 mg Intravenous Q12H   PARoxetine  20 mg Oral Daily   Continuous Infusions:  sodium chloride Stopped (04/18/22 1400)   sodium chloride 20 mL/hr at 04/20/22 1133   iron sucrose     PRN Meds: acetaminophen **OR** acetaminophen, ondansetron **OR** ondansetron (ZOFRAN) IV, polyvinyl alcohol  Time spent: 50 minutes  Author: Val Riles. MD [facility] 04/20/2022 2:28 PM  To reach On-call, see care teams to locate the attending and reach out to them via [email or web address]. If 7PM-7AM, please contact night-coverage If  you still have difficulty reaching the attending provider, please page the [facility] (Director on Call) for [facility] on amion for assistance.

## 2022-04-20 NOTE — H&P (Signed)
Wyline Mood, MD [address], [address], [place], [place], [address] [address], [place], [place], [address] Phone: [phone]  Fax: [phone]  Primary Care Physician:  Dorothey Baseman, MD   Pre-Procedure History & Physical: HPI:  Michele James is a 67 y.o. female is here for an endoscopy and colonoscopy    Past Medical History:  Diagnosis Date   Anemia    Arthritis    knees   Cerebral palsy (HCC)    Depression    Diabetes mellitus without complication (HCC)    Diverticulosis    Dysphagia    Eczema    GERD (gastroesophageal reflux disease)    Glaucoma    Hemorrhoids    Hyperlipidemia    Hypothyroidism    Seizures (HCC)    none in over 10 yrs    Past Surgical History:  Procedure Laterality Date   BREAST BIOPSY Right 03/22/2020   stereo bx, ribbon clip, path pending    CATARACT EXTRACTION W/PHACO Right 02/06/2016   Procedure: CATARACT EXTRACTION PHACO AND INTRAOCULAR LENS PLACEMENT (IOC);  Surgeon: Lockie Mola, MD;  Location: [facility];  Service: Ophthalmology;  Laterality: Right;   CATARACT EXTRACTION W/PHACO Left 03/05/2016   Procedure: CATARACT EXTRACTION PHACO AND INTRAOCULAR LENS PLACEMENT (IOC);  Surgeon: Lockie Mola, MD;  Location: [facility];  Service: Ophthalmology;  Laterality: Left;   CHOLECYSTECTOMY N/A 11/16/2015   Procedure: LAPAROSCOPIC CHOLECYSTECTOMY;  Surgeon: Tiney Rouge III, MD;  Location: [facility] OR[facility];  Service: General;  Laterality: N/A;  attempted cholangiogram   COLONOSCOPY WITH PROPOFOL N/A 12/25/2014   Procedure: COLONOSCOPY WITH PROPOFOL;  Surgeon: Wallace Cullens, MD;  Location: [facility];  Service: Gastroenterology;  Laterality: N/A;   COLONOSCOPY WITH PROPOFOL N/A 04/14/2019   Procedure: COLONOSCOPY WITH PROPOFOL;  Surgeon: Toledo, Boykin Nearing, MD;  Location: [facility] ENDOSCOPY;  Service: Gastroenterology;  Laterality: N/A;   ENDOSCOPIC RETROGRADE CHOLANGIOPANCREATOGRAPHY (ERCP) WITH PROPOFOL N/A  11/18/2015   Procedure: ENDOSCOPIC RETROGRADE CHOLANGIOPANCREATOGRAPHY (ERCP) WITH PROPOFOL;  Surgeon: Midge Minium, MD;  Location: [facility] ENDOSCOPY;  Service: Endoscopy;  Laterality: N/A;   ESOPHAGOGASTRODUODENOSCOPY (EGD) WITH PROPOFOL N/A 04/14/2019   Procedure: ESOPHAGOGASTRODUODENOSCOPY (EGD) WITH PROPOFOL;  Surgeon: Toledo, Boykin Nearing, MD;  Location: [facility] ENDOSCOPY;  Service: Gastroenterology;  Laterality: N/A;   EYE SURGERY     Cataract Surgery    HERNIA REPAIR      Prior to Admission medications   Medication Sig Start Date End Date Taking? Authorizing Provider  acetaminophen (TYLENOL) 325 MG tablet Take 325 mg by mouth every 6 (six) hours as needed.   Yes [provider]  alendronate (FOSAMAX) 70 MG tablet Take 70 mg by mouth once a week. Take with a full glass of water on an empty stomach.   Yes [provider]  ALPRAZolam (XANAX) 0.25 MG tablet Take 0.25 mg by mouth as needed for anxiety. Before pap smears   Yes [provider]  atorvastatin (LIPITOR) 40 MG tablet Take 40 mg by mouth daily.   Yes [provider]  calcium citrate-vitamin D (CITRACAL+D) 315-200 MG-UNIT per tablet Take 1 tablet by mouth 2 (two) times daily.   Yes [provider]  carbamazepine (TEGRETOL) 200 MG tablet Take 200 mg by mouth 3 (three) times daily.   Yes [provider]  Cholecalciferol 100 MCG (4000 UT) TABS Take 1,000 Units by mouth.   Yes [provider]  esomeprazole (NEXIUM) 40 MG capsule Take 40 mg by mouth daily at 12 noon.  Yes [provider]  famotidine (PEPCID) 20 MG tablet Take 1 tablet by mouth 2 (two) times daily. 04/23/20  Yes [provider]  hydroxypropyl methylcellulose / hypromellose (ISOPTO TEARS / GONIOVISC) 2.5 % ophthalmic solution 1 drop as needed for dry eyes.   Yes [provider]  levothyroxine (SYNTHROID) 112 MCG tablet Take 112 mcg by mouth every morning. 03/04/21  Yes [provider]   magnesium oxide (MAG-OX) 400 MG tablet Take 1 tablet by mouth 3 (three) times daily. 01/13/22  Yes [provider]  naproxen (NAPROSYN) 375 MG tablet Take 375 mg by mouth 2 (two) times daily with a meal.   Yes [provider]  OLANZapine (ZYPREXA) 10 MG tablet Take 1 tablet (10 mg total) by mouth at bedtime. 04/17/22 07/16/22 Yes Hisada, Elie Goody, MD  OLANZapine (ZYPREXA) 5 MG tablet Take 1 tablet (5 mg total) by mouth daily. 03/20/22 06/18/22 Yes Hisada, Elie Goody, MD  PARoxetine (PAXIL) 20 MG tablet Take 1 tablet (20 mg total) by mouth daily. 11/04/21 04/18/22 Yes Hisada, Elie Goody, MD  potassium chloride 20 MEQ/15ML (10%) SOLN Take 10 mEq by mouth daily.   Yes [provider]  vitamin E 400 UNIT capsule Take 400 Units by mouth daily.   Yes [provider]  magnesium oxide (MAG-OX) 400 (240 Mg) MG tablet Take 1 tablet by mouth 3 (three) times daily. 02/18/21   [provider]  Wheat Dextrin (BENEFIBER PO) Take by mouth. Patient not taking: Reported on 04/18/2022    [provider]    Allergies as of 04/18/2022 - Review Complete 04/18/2022  Allergen Reaction Noted   Aminophylline Other (See Comments) 11/15/2015   Cinnamon Other (See Comments) 12/23/2017   Advil [ibuprofen] Itching and Rash 08/08/2013   Amoxicillin Rash 11/15/2015   Latex Rash 04/28/2016   Nsaids Rash 11/15/2015   Penicillins Nausea And Vomiting 01/02/2012   Potassium Nausea And Vomiting and Nausea Only    Potassium-containing compounds Nausea And Vomiting 11/15/2015   Septra [sulfamethoxazole-trimethoprim] Rash 11/15/2015   Sulfa antibiotics Rash 11/15/2015   Sulfasalazine Rash 11/15/2015   Tape Rash 02/04/2016   Theophyllines Other (See Comments) 11/15/2015    Family History  Problem Relation Age of Onset   High blood pressure Mother    Hyperlipidemia Mother    Diabetes Mellitus II Mother    Stroke Mother    Osteoporosis Mother    High blood pressure Father    Breast  cancer Maternal Aunt    Stroke Maternal Grandfather    Diabetes Mellitus II Maternal Grandfather    Stroke Maternal Grandmother    Diabetes Mellitus II Maternal Grandmother     Social History   Socioeconomic History   Marital status: Single    Spouse name: Not on file   Number of children: Not on file   Years of education: Not on file   Highest education level: Not on file  Occupational History   Not on file  Tobacco Use   Smoking status: Former    Types: Cigarettes    Quit date: 06/20/1980    Years since quitting: 41.8   Smokeless tobacco: Never   Tobacco comments:    smoked socially in early 1980s (reports for 4 months)  Vaping Use   Vaping Use: Never used  Substance and Sexual Activity   Alcohol use: No   Drug use: No   Sexual activity: Not Currently  Other Topics Concern   Not on file  Social History Narrative   Not on file  Social Determinants of Health   Financial Resource Strain: Not on file  Food Insecurity: No Food Insecurity (04/18/2022)   Hunger Vital Sign    Worried About Running Out of Food in the Last Year: Never true    Ran Out of Food in the Last Year: Never true  Transportation Needs: No Transportation Needs (04/18/2022)   PRAPARE - Hydrologist (Medical): No    Lack of Transportation (Non-Medical): No  Physical Activity: Not on file  Stress: Not on file  Social Connections: Not on file  Intimate Partner Violence: Not At Risk (04/18/2022)   Humiliation, Afraid, Rape, and Kick questionnaire    Fear of Current or Ex-Partner: No    Emotionally Abused: No    Physically Abused: No    Sexually Abused: No    Review of Systems: See HPI, otherwise negative ROS  Physical Exam: BP 120/71 (BP Location: Right Arm)   Pulse 82   Temp 98.8 F (37.1 C)   Resp 16   Ht 5\' 4"  (1.626 m)   Wt 59 kg   SpO2 99%   BMI 22.31 kg/m  General:   Alert,  pleasant and cooperative in NAD Head:  Normocephalic and atraumatic. Neck:   Supple; no masses or thyromegaly. Lungs:  Clear throughout to auscultation, normal respiratory effort.    Heart:  +S1, +S2, Regular rate and rhythm, No edema. Abdomen:  Soft, nontender and nondistended. Normal bowel sounds, without guarding, and without rebound.   Neurologic:  Alert and  oriented x4;  grossly normal neurologically.  Impression/Plan: Michele James is here for a push enteroscopy and colonoscopy  to be performed for  evaluation of iron deficiency anemia     Risks, benefits, limitations, and alternatives regarding endoscopy have been reviewed with the patient.  Questions have been answered.  All parties agreeable.   Jonathon Bellows, MD  04/20/2022, 10:46 AM

## 2022-04-21 ENCOUNTER — Encounter: Payer: Self-pay | Admitting: Gastroenterology

## 2022-04-21 DIAGNOSIS — D500 Iron deficiency anemia secondary to blood loss (chronic): Secondary | ICD-10-CM | POA: Diagnosis not present

## 2022-04-21 LAB — CBC
HCT: 27.2 % — ABNORMAL LOW (ref 36.0–46.0)
Hemoglobin: 8.2 g/dL — ABNORMAL LOW (ref 12.0–15.0)
MCH: 27.1 pg (ref 26.0–34.0)
MCHC: 30.1 g/dL (ref 30.0–36.0)
MCV: 89.8 fL (ref 80.0–100.0)
Platelets: 391 10*3/uL (ref 150–400)
RBC: 3.03 MIL/uL — ABNORMAL LOW (ref 3.87–5.11)
RDW: 14.9 % (ref 11.5–15.5)
WBC: 8.3 10*3/uL (ref 4.0–10.5)
nRBC: 0 % (ref 0.0–0.2)

## 2022-04-21 LAB — BASIC METABOLIC PANEL
Anion gap: 6 (ref 5–15)
BUN: 5 mg/dL — ABNORMAL LOW (ref 8–23)
CO2: 25 mmol/L (ref 22–32)
Calcium: 8.3 mg/dL — ABNORMAL LOW (ref 8.9–10.3)
Chloride: 108 mmol/L (ref 98–111)
Creatinine, Ser: 0.43 mg/dL — ABNORMAL LOW (ref 0.44–1.00)
GFR, Estimated: >60 mL/min (ref >60)
Glucose, Bld: 118 mg/dL — ABNORMAL HIGH (ref 70–99)
Potassium: 3.4 mmol/L — ABNORMAL LOW (ref 3.5–5.1)
Sodium: 139 mmol/L (ref 135–145)

## 2022-04-21 LAB — GLUCOSE, CAPILLARY: Glucose-Capillary: 67 mg/dL — ABNORMAL LOW (ref 70–99)

## 2022-04-21 LAB — MAGNESIUM: Magnesium: 2.2 mg/dL (ref 1.7–2.4)

## 2022-04-21 LAB — PHOSPHORUS: Phosphorus: 3.5 mg/dL (ref 2.5–4.6)

## 2022-04-21 MED ORDER — POLYSACCHARIDE IRON COMPLEX 150 MG PO CAPS: ORAL_CAPSULE | ORAL | 0 refills | Status: AC

## 2022-04-21 MED ORDER — CYANOCOBALAMIN 500 MCG PO TABS: 500.0000 ug | ORAL_TABLET | Freq: Every day | ORAL | 0 refills | Status: AC

## 2022-04-21 MED ORDER — ASCORBIC ACID 500 MG PO TABS: 500.0000 mg | ORAL_TABLET | Freq: Every day | ORAL | 2 refills | Status: AC

## 2022-04-21 NOTE — Discharge Summary (Signed)
[facility] Discharge Summary   Patient: Michele James DTO:[id number]  PCP: Juluis Pitch, MD  Date of admission: 04/18/2022   Date of discharge:  04/21/2022     Discharge Diagnoses:  Principal Problem:   Iron deficiency anemia due to chronic blood loss Active Problems:   Cerebral palsy (HCC)   Depression   Hypothyroidism (acquired)   Seizure ([place])   Symptomatic anemia   Admitted From: Home Disposition:  Home   Recommendations for Outpatient Follow-up:  Follow with PCP in 1 week, repeat CBC after 1 week Follow-up with GI as an outpatient in few weeks for further workup of anemia and low iron.  Follow biopsy report, and patient may need VCE Follow up LABS/TEST:  CBC in  w   Diet recommendation: Regular diet  Activity: The patient is advised to gradually reintroduce usual activities, as tolerated  Discharge Condition: stable  Code Status: Full code   History of present illness: As per the H and P dictated on admission Hospital Course:  Michele James is a 67 y.o. 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 Patient had blood work done in the cancer center and was found to have a hemoglobin of 6.9g/dl.  Chart review shows that her blood count has been low over the past several weeks.  Chart review shows she had a hemoglobin of 12.2 in 04/23.  She states that her stools have been dark but she denies having any abdominal pain. She denies having any hematemesis or hematochezia but is on naproxen twice daily. She complains of occasional shortness of breath but denies having any chest pain, no dizziness, no lightheadedness or any falls. She denies having any fever, no chills, no cough, no urinary symptoms, no focal  deficit, no blurred vision, no headache.  Ed w/up: Abnormal labs include an iron of 20, ferritin of 6, hemoglobin 7.1 Stools were heme positive in the ER, 1 unit of packed RBC was ordered in the ER   Assessment and Plan: # Iron deficiency anemia due to chronic blood loss Patient presents to the ER from the cancer center for evaluation of symptomatic iron deficiency anemia with a hemoglobin of 6.9. Patient has had melena stools but denies having any hematemesis or hematochezia and is on NSAIDs at home concerning for possible gastritis resulted in chronic blood loss versus peptic ulcer disease. S/p 1 unit of PRBC transfusion. Hb 8.4, stable. S/p Protonix 40 mg IV every 12. Consult GI, s/p EGD and colonoscopy, no source of bleeding was identified.  Patient was cleared by GI to follow as an outpatient, patient may need VCE, Follow-up with GI for biopsy report. # Iron deficiency, transferrin saturation 5%, S/p Venofer 500 mg x 1 dose and Venofer 300 mg x 1 dose given, Venofer 200 mg IV one-time dose ordered for today, which was started by RN but patient lost peripheral IV so it was discontinued.  Patient was started on oral iron supplement. Vitamin B12 level 429, started oral supplement to prevent deficiency.  Folic acid level wnl  # Seizure: Continue Carbamazepine # Hypothyroidism (acquired), Continue Synthroid # Depression, Continue Zyprexa and Paxil # Cerebral palsy, With left upper and lower extremity contractures limiting mobility Patient is wheelchair-bound and requires assistance with ADLs   Body mass index is 22.31 kg/m.  Interventions:  On the day of the discharge the patient's vitals were stable, and no other acute medical condition were reported by patient. the patient was felt safe to be discharge at Home.  Continue home services.  Consultants: GI Procedures: s/p EGD and colonoscopy  Discharge Exam: General: Appear in no distress, no Rash; Oral Mucosa Clear, moist. Cardiovascular: S1  and S2 Present, no Murmur, Respiratory: normal respiratory effort, Bilateral Air entry present and no Crackles, no wheezes Abdomen: Bowel Sound present, Soft and no tenderness, no hernia Extremities: no Pedal edema, no calf tenderness Neurology: alert and oriented to time, place, and person. Cerebral palsy, left-sided contracture, imbalanced gait.  affect appropriate.  Filed Weights   04/18/22 0937  Weight: 59 kg   Vitals:   04/21/22 0003 04/21/22 0822  BP: 132/73 (!) 143/73  Pulse: 80 78  Resp: 20 16  Temp: 98.5 F (36.9 C) 97.9 F (36.6 C)  SpO2: 98% 100%    DISCHARGE MEDICATION: Allergies as of 04/21/2022       Reactions   Aminophylline Other (See Comments)   Headache   Cinnamon Other (See Comments)   Stomachache    Advil [ibuprofen] Itching, Rash   Pt states she can take ibuprofen   Amoxicillin Rash   Latex Rash   Nsaids Rash   Penicillins Nausea And Vomiting   Potassium Nausea And Vomiting, Nausea Only   Potassium-containing Compounds Nausea And Vomiting   Septra [sulfamethoxazole-trimethoprim] Rash   Sulfa Antibiotics Rash   Sulfasalazine Rash   Tape Rash   Paper tape ok   Theophyllines Other (See Comments)   Headache        Medication List     STOP taking these medications    BENEFIBER PO   magnesium oxide 400 (240 Mg) MG tablet Commonly known as: MAG-OX       TAKE these medications    acetaminophen 325 MG tablet Commonly known as: TYLENOL Take 325 mg by mouth every 6 (six) hours as needed.   alendronate 70 MG tablet Commonly known as: FOSAMAX Take 70 mg by mouth once a week. Take with a full glass of water on an empty stomach.   ALPRAZolam 0.25 MG tablet Commonly known as: XANAX Take 0.25 mg by mouth as needed for anxiety. Before pap smears   ascorbic acid 500 MG tablet Commonly known as: VITAMIN C Take 1 tablet (500 mg total) by mouth daily.   atorvastatin 40 MG tablet Commonly known as: LIPITOR Take 40 mg by mouth daily.    calcium citrate-vitamin D 315-200 MG-UNIT tablet Commonly known as: CITRACAL+D Take 1 tablet by mouth 2 (two) times daily.   carbamazepine 200 MG tablet Commonly known as: TEGRETOL Take 200 mg by mouth 3 (three) times daily.   Cholecalciferol 100 MCG (4000 UT) Tabs Take 1,000 Units by mouth.   cyanocobalamin 500 MCG tablet Commonly known as: VITAMIN B12 Take 1 tablet (500 mcg total) by mouth daily. Start taking on: April 22, 2022   esomeprazole 40 MG capsule Commonly known as: NEXIUM Take 40 mg by mouth daily at 12 noon.   famotidine 20 MG tablet Commonly known as: PEPCID Take 1 tablet by mouth 2 (two) times daily.   hydroxypropyl methylcellulose / hypromellose 2.5 % ophthalmic solution Commonly known as: ISOPTO TEARS / GONIOVISC 1 drop as needed for dry eyes.   iron polysaccharides 150 MG capsule Commonly known as: NIFEREX Take 1 capsule (150 mg total) by mouth 2 (two) times daily for 30 days, THEN 1  capsule (150 mg total) daily. Start taking on: April 21, 2022   levothyroxine 112 MCG tablet Commonly known as: SYNTHROID Take 112 mcg by mouth every morning.   magnesium oxide 400 MG tablet Commonly known as: MAG-OX Take 1 tablet by mouth 3 (three) times daily.   naproxen 375 MG tablet Commonly known as: NAPROSYN Take 375 mg by mouth 2 (two) times daily with a meal.   OLANZapine 5 MG tablet Commonly known as: ZYPREXA Take 1 tablet (5 mg total) by mouth daily.   OLANZapine 10 MG tablet Commonly known as: ZYPREXA Take 1 tablet (10 mg total) by mouth at bedtime.   PARoxetine 20 MG tablet Commonly known as: PAXIL Take 1 tablet (20 mg total) by mouth daily.   potassium chloride 20 MEQ/15ML (10%) Soln Take 10 mEq by mouth daily.   vitamin E 180 MG (400 UNITS) capsule Take 400 Units by mouth daily.       Allergies  Allergen Reactions   Aminophylline Other (See Comments)    Headache   Cinnamon Other (See Comments)    Stomachache    Advil [Ibuprofen]  Itching and Rash    Pt states she can take ibuprofen   Amoxicillin Rash   Latex Rash   Nsaids Rash   Penicillins Nausea And Vomiting   Potassium Nausea And Vomiting and Nausea Only   Potassium-Containing Compounds Nausea And Vomiting   Septra [Sulfamethoxazole-Trimethoprim] Rash   Sulfa Antibiotics Rash   Sulfasalazine Rash   Tape Rash    Paper tape ok   Theophyllines Other (See Comments)    Headache    Discharge Instructions     Call MD for:  difficulty breathing, headache or visual disturbances   Complete by: As directed    Call MD for:  extreme fatigue   Complete by: As directed    Call MD for:  persistant dizziness or light-headedness   Complete by: As directed    Call MD for:  persistant nausea and vomiting   Complete by: As directed    Call MD for:  severe uncontrolled pain   Complete by: As directed    Call MD for:  temperature >100.4   Complete by: As directed    Diet - low sodium heart healthy   Complete by: As directed    Discharge instructions   Complete by: As directed    Follow with PCP in 1 week, repeat CBC after 1 week Follow-up with GI as an outpatient in few weeks for further workup of anemia and low iron.  Follow biopsy report, and patient may need VCE   Increase activity slowly   Complete by: As directed        The results of significant diagnostics from this hospitalization (including imaging, microbiology, ancillary and laboratory) are listed below for reference.    Significant Diagnostic Studies: No results found.  Microbiology: No results found for this or any previous visit (from the past 240 hour(s)).   Labs: CBC: Recent Labs  Lab 04/18/22 0825 04/18/22 0942 04/18/22 2142 04/19/22 0452 04/20/22 0440 04/21/22 0348  WBC 5.3 5.6  --  4.5 8.3 8.3  NEUTROABS 2.8 3.1  --   --   --   --   HGB 6.9* 7.1* 8.2* 8.4* 8.1* 8.2*  HCT 23.7* 24.5* 26.3* 27.4* 26.4* 27.2*  MCV 86.8 87.2  --  87.5 88.3 89.8  PLT 425* 446*  --  397 398 540    Basic Metabolic Panel: Recent Labs  Lab 04/18/22 0942  04/19/22 0452 04/20/22 0440 04/21/22 0348  NA 135 141 141 139  K 3.8 4.0 3.6 3.4*  CL 101 109 111 108  CO2 26 26 23 25   GLUCOSE 121* 106* 101* 118*  BUN 14 8 5* 5*  CREATININE 0.54 0.35* 0.38* 0.43*  CALCIUM 8.5* 8.2* 8.2* 8.3*  MG  --   --  2.0 2.2  PHOS  --   --  2.9 3.5   Liver Function Tests: No results for input(s): "AST", "ALT", "ALKPHOS", "BILITOT", "PROT", "ALBUMIN" in the last 168 hours. No results for input(s): "LIPASE", "AMYLASE" in the last 168 hours. No results for input(s): "AMMONIA" in the last 168 hours. Cardiac Enzymes: No results for input(s): "CKTOTAL", "CKMB", "CKMBINDEX", "TROPONINI" in the last 168 hours. BNP (last 3 results) No results for input(s): "BNP" in the last 8760 hours. CBG: No results for input(s): "GLUCAP" in the last 168 hours.  Time spent: 35 minutes  Signed:   [facility] 04/21/2022 11:52 AM

## 2022-04-21 NOTE — Progress Notes (Signed)
  Transition of Care [facility]) Screening Note   Patient Details  Name: TANNYA GONET Date of Birth: [birth year]   Transition of Care [facility]) CM/SW Contact:    Quin Hoop, LCSW Phone Number: 04/21/2022, 10:46 AM    Transition of Care Department [facility]) has reviewed patient and no TOC needs have been identified at this time. We will continue to monitor patient advancement through interdisciplinary progression rounds. If new patient transition needs arise, please place a TOC consult.

## 2022-04-22 ENCOUNTER — Encounter: Payer: Self-pay | Admitting: Gastroenterology

## 2022-04-22 LAB — CELIAC DISEASE PANEL
Endomysial Ab, IgA: NEGATIVE
IgA: 485 mg/dL — ABNORMAL HIGH (ref 87–352)
Tissue Transglutaminase Ab, IgA: <2 U/mL (ref 0–3)

## 2022-04-22 LAB — SURGICAL PATHOLOGY

## 2022-04-22 LAB — MISC LABCORP TEST (SEND OUT): Labcorp test code: 81950

## 2022-04-22 MED FILL — Iron Sucrose Inj 20 MG/ML (Fe Equiv): INTRAVENOUS | Qty: 10 | Status: AC

## 2022-04-23 ENCOUNTER — Other Ambulatory Visit: Payer: Self-pay | Admitting: Psychiatry

## 2022-04-23 ENCOUNTER — Inpatient Hospital Stay: Payer: Medicare Other

## 2022-04-25 ENCOUNTER — Other Ambulatory Visit: Payer: Medicare Other

## 2022-04-25 ENCOUNTER — Other Ambulatory Visit: Payer: Self-pay | Admitting: Psychiatry

## 2022-04-29 ENCOUNTER — Ambulatory Visit: Payer: Medicare Other

## 2022-04-29 ENCOUNTER — Ambulatory Visit: Payer: Medicare Other | Admitting: Oncology

## 2022-05-11 ENCOUNTER — Other Ambulatory Visit: Payer: Self-pay | Admitting: Psychiatry

## 2022-05-14 ENCOUNTER — Other Ambulatory Visit: Payer: Self-pay | Admitting: Psychiatry

## 2022-05-18 ENCOUNTER — Other Ambulatory Visit: Payer: Self-pay | Admitting: Psychiatry

## 2022-05-20 ENCOUNTER — Encounter: Payer: Self-pay | Admitting: Oncology

## 2022-05-20 NOTE — Telephone Encounter (Signed)
  I believe she should have enough medication for both 5 mg and 10 mg for the next few months. Please contact the pharmacy and verify the order.

## 2022-05-20 NOTE — Telephone Encounter (Signed)
Patient husband stopped by office states she needs refill of Olanzapine 10 mg and 5 mg. Drug store ran out and he had to double up on the 5 to make 10. He tried to get the drug store to correct error but they will not. States refill has to be sent by office. Please call to clarify why pharmacy did this process and for refill.

## 2022-05-21 NOTE — Telephone Encounter (Signed)
Noted, thanks. Please inform the patient regarding the above information.

## 2022-05-21 NOTE — Telephone Encounter (Signed)
states that the pharmacy at the time did not have to 38m so they had to so he had to double up on the 511m and now he is out of the 47m39m

## 2022-05-21 NOTE — Telephone Encounter (Signed)
Looks like rx for the olanzapine 82m was sent to pharmacy on 05-18-22 and the olanzapine 139mwas sent on 04-17-22

## 2022-05-21 NOTE — Telephone Encounter (Signed)
called and spoke with a Michele James she states that the insurance will not pay for refill because it is too early for him to have filled.  she states that the 15m was filled for a 90 day supply on 03-17-22 and the 178mwas filled on 04-17-22

## 2022-06-03 ENCOUNTER — Other Ambulatory Visit: Payer: Self-pay | Admitting: Psychiatry

## 2022-06-16 ENCOUNTER — Ambulatory Visit: Payer: Medicare Other | Admitting: Dermatology

## 2022-06-23 ENCOUNTER — Other Ambulatory Visit: Payer: Self-pay | Admitting: Psychiatry

## 2022-06-25 ENCOUNTER — Other Ambulatory Visit: Payer: Self-pay | Admitting: Psychiatry

## 2022-07-08 ENCOUNTER — Encounter: Payer: Self-pay | Admitting: Oncology

## 2022-07-10 ENCOUNTER — Encounter: Payer: Self-pay | Admitting: Oncology

## 2022-07-10 ENCOUNTER — Ambulatory Visit: Payer: Medicare Other | Admitting: Psychiatry

## 2022-07-11 ENCOUNTER — Other Ambulatory Visit: Payer: Self-pay | Admitting: Psychiatry

## 2022-07-14 NOTE — Progress Notes (Signed)
BH MD/PA/NP OP Progress Note  07/17/2022 4:13 PM Michele James  MRN:  [id number]  Chief Complaint:  Chief Complaint  Patient presents with   Follow-up   HPI:  - since the last visit, she was admitted for iron deficiency anemia due to chronic blood loss.She underwent enteroscopy. The esophagus was normal.  A large hiatal hernia was present.The examined duodenum was normal.There was no evidence of significant pathology in the proximal jejunum. Biopsies for histology were taken with a cold forceps for evaluation of celiac disease  This is a follow-up appointment for schizophrenia and depression.  She states that she sees some people when she is awake.  It has been occurring for the past month.  She denies AH.  She abruptly brings a paper, asking "do you want me to work on this." (On paper, she wrote down "do not resuscitate").  She agrees to work on with her PCP.  She states that she lives with her parents and her aunt.  She is in her 65s, and her father is in 61's.  She denies feeling depressed or anxiety.  She sleeps well.  She denies SI.   Her parents presents to the visit.  Her father provides majority of the story.  He states that she has been doing fairly good.  He thinks her memory is slowing down.  Although he reminded her that today is 18th at least a few times prior to the visit, she was unable to tell it.  He denies any concern otherwise.  He states that she has cerebral palsy. (The patient abruptly states that her mother gave her pepto bisomo, and talks about the previous admission).  Although he was not aware of AH, he feels comfortable to stay on the current medication regimen at this time.    Wt Readings from Last 3 Encounters:  04/18/22 130 lb (59 kg)  04/18/22 130 lb (59 kg)  01/13/22 124 lb 14.4 oz (56.7 kg)      Visit Diagnosis:    ICD-10-CM   1. Schizophrenia, unspecified type  F20.9     2. 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 I have reviewed the history. No updates at this time.     Past Medical History:  Past Medical History:  Diagnosis Date   Anemia    Arthritis    knees   Cerebral palsy    Depression    Diabetes mellitus without complication    Diverticulosis    Dysphagia    Eczema    GERD (gastroesophageal reflux disease)    Glaucoma    Hemorrhoids    Hyperlipidemia    Hypothyroidism    Seizures    none in over 10 yrs    Past Surgical History:  Procedure Laterality Date   BREAST BIOPSY Right 03/22/2020   stereo bx, ribbon clip, path pending    CATARACT EXTRACTION W/PHACO Right 02/06/2016   Procedure: CATARACT EXTRACTION PHACO AND INTRAOCULAR LENS PLACEMENT (IOC);  Surgeon: Lockie Mola, MD;  Location: [facility];  Service: Ophthalmology;  Laterality: Right;   CATARACT EXTRACTION W/PHACO Left 03/05/2016   Procedure: CATARACT EXTRACTION PHACO AND INTRAOCULAR LENS PLACEMENT (IOC);  Surgeon: Lockie Mola, MD;  Location: [facility];  Service: Ophthalmology;  Laterality: Left;   CHOLECYSTECTOMY N/A 11/16/2015   Procedure: LAPAROSCOPIC CHOLECYSTECTOMY;  Surgeon: Tiney Rouge III, MD;  Location: [facility] OR[facility];  Service: General;  Laterality: N/A;  attempted cholangiogram   COLONOSCOPY WITH PROPOFOL N/A 12/25/2014   Procedure: COLONOSCOPY WITH PROPOFOL;  Surgeon: Wallace Cullens, MD;  Location: [facility];  Service: Gastroenterology;  Laterality: N/A;   COLONOSCOPY WITH PROPOFOL N/A 04/14/2019   Procedure: COLONOSCOPY WITH PROPOFOL;  Surgeon: Toledo, Boykin Nearing, MD;  Location: [facility] ENDOSCOPY;  Service: Gastroenterology;  Laterality: N/A;   COLONOSCOPY WITH PROPOFOL N/A 04/20/2022   Procedure: COLONOSCOPY WITH PROPOFOL;  Surgeon: Wyline Mood, MD;  Location: [facility];  Service: Gastroenterology;  Laterality: N/A;   ENDOSCOPIC RETROGRADE CHOLANGIOPANCREATOGRAPHY (ERCP) WITH PROPOFOL N/A 11/18/2015    Procedure: ENDOSCOPIC RETROGRADE CHOLANGIOPANCREATOGRAPHY (ERCP) WITH PROPOFOL;  Surgeon: Midge Minium, MD;  Location: [facility] ENDOSCOPY;  Service: Endoscopy;  Laterality: N/A;   ESOPHAGOGASTRODUODENOSCOPY (EGD) WITH PROPOFOL N/A 04/14/2019   Procedure: ESOPHAGOGASTRODUODENOSCOPY (EGD) WITH PROPOFOL;  Surgeon: Toledo, Boykin Nearing, MD;  Location: [facility] ENDOSCOPY;  Service: Gastroenterology;  Laterality: N/A;   ESOPHAGOGASTRODUODENOSCOPY (EGD) WITH PROPOFOL N/A 04/20/2022   Procedure: ESOPHAGOGASTRODUODENOSCOPY (EGD) WITH PROPOFOL;  Surgeon: Wyline Mood, MD;  Location: [facility] END[facility]COPY;  Service: Gastroenterology;  Laterality: N/A;   EYE SURGERY     Cataract Surgery    HERNIA REPAIR      Family Psychiatric History: Please see initial evaluation for full details. I have reviewed the history. No updates at this time.     Family History:  Family History  Problem Relation Age of Onset   High blood pressure Mother    Hyperlipidemia Mother    Diabetes Mellitus II Mother    Stroke Mother    Osteoporosis Mother    High blood pressure Father    Breast cancer Maternal Aunt    Stroke Maternal Grandfather    Diabetes Mellitus II Maternal Grandfather    Stroke Maternal Grandmother    Diabetes Mellitus II Maternal Grandmother     Social History:  Social History   Socioeconomic History   Marital status: Single    Spouse name: Not on file   Number of children: Not on file   Years of education: Not on file   Highest education level: Not on file  Occupational History   Not on file  Tobacco Use   Smoking status: Former    Types: Cigarettes    Quit date: 06/20/1980    Years since quitting: 42.1   Smokeless tobacco: Never   Tobacco comments:    smoked socially in early 1980s (reports for 4 months)  Vaping Use   Vaping Use: Never used  Substance and Sexual Activity   Alcohol use: No   Drug use: No   Sexual activity: Not Currently  Other Topics Concern   Not on file  Social History Narrative    Not on file   Social Determinants of Health   Financial Resource Strain: Not on file  Food Insecurity: No Food Insecurity (04/18/2022)   Hunger Vital Sign    Worried About Running Out of Food in the Last Year: Never true    Ran Out of Food in the Last Year: Never true  Transportation Needs: No Transportation Needs (04/18/2022)   PRAPARE - Administrator, Civil Service (Medical): No    Lack of Transportation (Non-Medical): No  Physical Activity: Not on file  Stress: Not on file  Social Connections: Not on file    Allergies:  Allergies  Allergen Reactions   Aminophylline Other (See Comments)    Headache   Cinnamon Other (See Comments)    Stomachache    Advil [Ibuprofen] Itching  and Rash    Pt states she can take ibuprofen   Amoxicillin Rash   Latex Rash   Nsaids Rash   Penicillins Nausea And Vomiting   Potassium Nausea And Vomiting and Nausea Only   Potassium-Containing Compounds Nausea And Vomiting   Septra [Sulfamethoxazole-Trimethoprim] Rash   Sulfa Antibiotics Rash   Sulfasalazine Rash   Tape Rash    Paper tape ok   Theophyllines Other (See Comments)    Headache     Metabolic Disorder Labs: No results found for: "HGBA1C", "MPG" No results found for: "PROLACTIN" No results found for: "CHOL", "TRIG", "HDL", "CHOLHDL", "VLDL", "LDLCALC" Lab Results  Component Value Date   TSH 0.686 03/17/2021   TSH 0.172 (L) 05/16/2020    Therapeutic Level Labs: No results found for: "LITHIUM" No results found for: "VALPROATE" No results found for: "CBMZ"  Current Medications: Current Outpatient Medications  Medication Sig Dispense Refill   acetaminophen (TYLENOL) 325 MG tablet Take 325 mg by mouth every 6 (six) hours as needed.     alendronate (FOSAMAX) 70 MG tablet Take 70 mg by mouth once a week. Take with a full glass of water on an empty stomach.     ALPRAZolam (XANAX) 0.25 MG tablet Take 0.25 mg by mouth as needed for anxiety. Before pap smears      ascorbic acid (VITAMIN C) 500 MG tablet Take 1 tablet (500 mg total) by mouth daily. 30 tablet 2   atorvastatin (LIPITOR) 40 MG tablet Take 40 mg by mouth daily.     calcium citrate-vitamin D (CITRACAL+D) 315-200 MG-UNIT per tablet Take 1 tablet by mouth 2 (two) times daily.     carbamazepine (TEGRETOL) 200 MG tablet Take 200 mg by mouth 3 (three) times daily.     Cholecalciferol 100 MCG (4000 UT) TABS Take 1,000 Units by mouth.     cyanocobalamin (VITAMIN B12) 500 MCG tablet Take 1 tablet (500 mcg total) by mouth daily. 90 tablet 0   esomeprazole (NEXIUM) 40 MG capsule Take 40 mg by mouth daily at 12 noon.     famotidine (PEPCID) 20 MG tablet Take 1 tablet by mouth 2 (two) times daily.     hydroxypropyl methylcellulose / hypromellose (ISOPTO TEARS / GONIOVISC) 2.5 % ophthalmic solution 1 drop as needed for dry eyes.     iron polysaccharides (NIFEREX) 150 MG capsule Take 1 capsule (150 mg total) by mouth 2 (two) times daily for 30 days, THEN 1 capsule (150 mg total) daily. 150 capsule 0   levothyroxine (SYNTHROID) 112 MCG tablet Take 112 mcg by mouth every morning.     magnesium oxide (MAG-OX) 400 MG tablet Take 1 tablet by mouth 3 (three) times daily.     naproxen (NAPROSYN) 375 MG tablet Take 375 mg by mouth 2 (two) times daily with a meal.     OLANZapine (ZYPREXA) 5 MG tablet Take 1 tablet (5 mg total) by mouth daily. 90 tablet 0   PARoxetine (PAXIL) 20 MG tablet Take 1 tablet (20 mg total) by mouth daily. 90 tablet 1   potassium chloride 20 MEQ/15ML (10%) SOLN Take 10 mEq by mouth daily.     vitamin E 400 UNIT capsule Take 400 Units by mouth daily.     OLANZapine (ZYPREXA) 10 MG tablet Take 1 tablet (10 mg total) by mouth at bedtime. 90 tablet 0   No current facility-administered medications for this visit.     Musculoskeletal: Strength & Muscle Tone: within normal limits Gait & Station:  in a  wheel chair Patient leans: N/A  Psychiatric Specialty Exam: Review of Systems   Psychiatric/Behavioral:  Positive for hallucinations. Negative for agitation, behavioral problems, confusion, decreased concentration, dysphoric mood, self-injury, sleep disturbance and suicidal ideas. The patient is not nervous/anxious and is not hyperactive.   All other systems reviewed and are negative.   Blood pressure 122/70, pulse 79, temperature 98.2 F (36.8 C), temperature source Skin, height 5\' 4"  (1.626 m).Body mass index is 22.31 kg/m.  General Appearance: Fairly Groomed   Eye Contact:  Good  Speech:  Clear and Coherent  Volume:  Normal  Mood:   good  Affect:  Appropriate, Congruent, and Restricted  Thought Process:  Coherent and Irrelevant  Orientation:  Full (Time, Place, and Person) except date  Thought Content: Logical   Suicidal Thoughts:  No  Homicidal Thoughts:  No  Memory:  Immediate;   Good  Judgement:  Fair  Insight:  Shallow  Psychomotor Activity:  Normal, (left arm is rotated.) no rigidity, tremors on right arm  Concentration:  Concentration: Good and Attention Span: Good  Recall:  Good  Fund of Knowledge: Good  Language: Good  Akathisia:  No  Handed:  Right  AIMS (if indicated): not done  Assets:  Communication Skills Desire for Improvement  ADL's:  Intact  Cognition: WNL  Sleep:  Good   Screenings: GAD-7    Flowsheet Row Office Visit from 12/23/2021 in [facility]  Total GAD-7 Score 1      PHQ2-9    Flowsheet Row Office Visit from 02/03/2022 in [facility] Office Visit from 12/23/2021 in [facility] Office Visit from 11/04/2021 in [facility]  PHQ-2 Total Score 0 0 0      Flowsheet Row ED to Hosp-Admission (Discharged) from 04/18/2022 in [facility] ([facility]) Office Visit from 02/03/2022 in [facility] ED from  03/17/2021 in [facility] at [facility]  C-SSRS RISK CATEGORY No Risk No Risk No Risk        Assessment and Plan:  DANAEJAH JUBY is a 67 y.o. year old female with a history of depression, anxiety, seizure disorder, cerebral palsy, GERD, osteoporosis , who presents for follow up appointment for below.    1. Schizophrenia, unspecified type 2. MDD (major depressive disorder), recurrent, in partial remission R/o schizophrenia Acute stressors include: n/a Chronic stressors include: unemployment, limited care (elderly father, RN twice a week)   Transferred from [facility]/Dr. Johny Drilling. Admitted a few times, first at age 54 for depression.  According to the chart review, her father reports history of schizophrenia. No known developmental or intellectual disability. Collateral is limited due to her elderly father, who has memory loss. (Originally on olanzapine 2.5 mg in AM, 10 mg qhs, paxil 20 mg daily)  She continues to demonstrate concrete thought process, occasional disorganization.  Although she has occasional AH, it is not debilitating, and there has been overall improvement in somatization symptoms since uptitration of olanzapine.  Will continue olanzapine to target schizophrenia, and Paxil for depression.    # cognition  Mini cog- 1/3 (unable to tell date, wrote two 12 in clock) Functional Status   IADL: Independent in the following:            Requires assistance with the following: managing finances, medications, driving ADL  Independent in the following: walking          Requires  assistance with the following:bathing and hygiene, feeding, continence, grooming and toileting,  Folate, Vitamin B12 (wnl 42/2024), TSH low, fT4 0.99 05/2012) Images Head CT 02/2021 Brain: Normal anatomic configuration. Moderate periventricular white matter changes are present likely reflecting the sequela of small vessel ischemia, stable since prior examination. 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 No abnormal mass effect or midline shift. No evidence of acute intracranial hemorrhage or infarct. Ventricular size is normal. Cerebellum unremarkable. Neuropsych assessment:  Etiology: r/o vascular   Her father reports some concern about memory loss. IADL is limited primarily due to her condition of cerebral palsy.  Will continue to assess as needed.    Plan Continue olanzapine 5 mg in the a.m., 10 mg at night - EKG QTc 386 msec 03/2022, hr 71 Continue Paxil 20 mg daily (has meds per patient) Next appointment: 7/1 at 3:30 for 30 mins, IP - on carbamazepine 200 mg TID for seizure    The patient demonstrates the following risk factors for suicide: Chronic risk factors for suicide include: psychiatric disorder of depression . Acute risk factors for suicide include: unemployment. Protective factors for this patient include: positive social support and hope for the future. Considering these factors, the overall suicide risk at this point appears to be low. Patient is appropriate for outpatient follow up.      Collaboration of Care: Collaboration of Care: Other reviewed notes in Epic  Patient/Guardian was advised Release of Information must be obtained prior to any record release in order to collaborate their care with an outside provider. Patient/Guardian was advised if they have not already done so to contact the registration department to sign all necessary forms in order for [place] to release information regarding their care.   Consent: Patient/Guardian gives verbal consent for treatment and assignment of benefits for services provided during this visit. Patient/Guardian expressed understanding and agreed to proceed.    Neysa Hotter, MD 07/17/2022, 4:13 PM

## 2022-07-15 ENCOUNTER — Ambulatory Visit
Admission: RE | Admit: 2022-07-15 | Discharge: 2022-07-15 | Disposition: A | Discharge status: Home / Self Care | Payer: Medicare Other | Source: Ambulatory Visit | Attending: Oncology | Admitting: Oncology

## 2022-07-15 DIAGNOSIS — R911 Solitary pulmonary nodule: Secondary | ICD-10-CM | POA: Diagnosis present

## 2022-07-17 ENCOUNTER — Encounter: Payer: Self-pay | Admitting: Psychiatry

## 2022-07-17 ENCOUNTER — Inpatient Hospital Stay: Payer: Medicare Other

## 2022-07-17 ENCOUNTER — Inpatient Hospital Stay: Payer: Medicare Other | Attending: Oncology | Admitting: Oncology

## 2022-07-17 ENCOUNTER — Ambulatory Visit (INDEPENDENT_AMBULATORY_CARE_PROVIDER_SITE_OTHER): Payer: Medicare Other | Admitting: Psychiatry

## 2022-07-17 ENCOUNTER — Encounter: Payer: Self-pay | Admitting: Oncology

## 2022-07-17 VITALS — BP 122/70 | HR 79 | Temp 98.2°F | Ht 64.0 in

## 2022-07-17 VITALS — BP 109/55 | HR 98 | Temp 97.3°F | Resp 18

## 2022-07-17 DIAGNOSIS — F209 Schizophrenia, unspecified: Secondary | ICD-10-CM | POA: Diagnosis not present

## 2022-07-17 DIAGNOSIS — Z87891 Personal history of nicotine dependence: Secondary | ICD-10-CM | POA: Insufficient documentation

## 2022-07-17 DIAGNOSIS — R918 Other nonspecific abnormal finding of lung field: Secondary | ICD-10-CM | POA: Insufficient documentation

## 2022-07-17 DIAGNOSIS — F3341 Major depressive disorder, recurrent, in partial remission: Secondary | ICD-10-CM | POA: Diagnosis not present

## 2022-07-17 DIAGNOSIS — R911 Solitary pulmonary nodule: Secondary | ICD-10-CM

## 2022-07-17 DIAGNOSIS — D500 Iron deficiency anemia secondary to blood loss (chronic): Secondary | ICD-10-CM

## 2022-07-17 LAB — IRON AND TIBC
Iron: 99 ug/dL (ref 28–170)
Saturation Ratios: 34 % — ABNORMAL HIGH (ref 10.4–31.8)
TIBC: 294 ug/dL (ref 250–450)
UIBC: 195 ug/dL

## 2022-07-17 LAB — CBC WITH DIFFERENTIAL/PLATELET
Abs Immature Granulocytes: 0.05 10*3/uL (ref 0.00–0.07)
Basophils Absolute: 0 10*3/uL (ref 0.0–0.1)
Basophils Relative: 0 %
Eosinophils Absolute: 0.4 10*3/uL (ref 0.0–0.5)
Eosinophils Relative: 4 %
HCT: 32.2 % — ABNORMAL LOW (ref 36.0–46.0)
Hemoglobin: 10.7 g/dL — ABNORMAL LOW (ref 12.0–15.0)
Immature Granulocytes: 1 %
Lymphocytes Relative: 13 %
Lymphs Abs: 1.3 10*3/uL (ref 0.7–4.0)
MCH: 33.4 pg (ref 26.0–34.0)
MCHC: 33.2 g/dL (ref 30.0–36.0)
MCV: 100.6 fL — ABNORMAL HIGH (ref 80.0–100.0)
Monocytes Absolute: 1 10*3/uL (ref 0.1–1.0)
Monocytes Relative: 10 %
Neutro Abs: 7.1 10*3/uL (ref 1.7–7.7)
Neutrophils Relative %: 72 %
Platelets: 264 10*3/uL (ref 150–400)
RBC: 3.2 MIL/uL — ABNORMAL LOW (ref 3.87–5.11)
RDW: 12.8 % (ref 11.5–15.5)
WBC: 9.8 10*3/uL (ref 4.0–10.5)
nRBC: 0 % (ref 0.0–0.2)

## 2022-07-17 LAB — RETIC PANEL
Immature Retic Fract: 11.9 % (ref 2.3–15.9)
RBC.: 3.2 MIL/uL — ABNORMAL LOW (ref 3.87–5.11)
Retic Count, Absolute: 115.2 10*3/uL (ref 19.0–186.0)
Retic Ct Pct: 3.6 % — ABNORMAL HIGH (ref 0.4–3.1)
Reticulocyte Hemoglobin: 36.3 pg (ref >27.9)

## 2022-07-17 LAB — FERRITIN: Ferritin: 12 ng/mL (ref 11–307)

## 2022-07-17 MED ORDER — OLANZAPINE 10 MG PO TABS: 10.0000 mg | ORAL_TABLET | Freq: Every day | ORAL | 0 refills | Status: DC

## 2022-07-17 NOTE — Assessment & Plan Note (Addendum)
CT scan was reviewed and discussed.  Stable or decreased lung nodules. Patchy ground-glass opacities in both lungs, likely nflammatory/postinflammatory

## 2022-07-17 NOTE — Assessment & Plan Note (Signed)
Repeat cbc , iron tibc ferritin today.  Hemoglobin has improved. Iron panel is pending at the time of dictation.

## 2022-07-17 NOTE — Progress Notes (Signed)
Hematology/Oncology Progress note [phone] [phone]        CHIEF COMPLAINTS/REASON FOR VISIT:  lung nodule  ASSESSMENT & PLAN:   Lung nodule CT scan was reviewed and discussed.  Stable or decreased lung nodules. Patchy ground-glass opacities in both lungs, likely nflammatory/postinflammatory   Iron deficiency anemia due to chronic blood loss Repeat cbc , iron tibc ferritin today.  Hemoglobin has improved. Iron panel is pending at the time of dictation.   Orders Placed This Encounter  Procedures   Iron and TIBC    Standing Status:   Future    Number of Occurrences:   1    Standing Expiration Date:   07/17/2023   Ferritin    Standing Status:   Future    Number of Occurrences:   1    Standing Expiration Date:   01/16/2023   CBC with Differential/Platelet    Standing Status:   Future    Number of Occurrences:   1    Standing Expiration Date:   07/17/2023   Retic Panel    Standing Status:   Future    Number of Occurrences:   1    Standing Expiration Date:   07/17/2023   CBC with Differential (Cancer Center Only)    Standing Status:   Future    Standing Expiration Date:   07/17/2023   Iron and TIBC    Standing Status:   Future    Standing Expiration Date:   07/17/2023   Ferritin    Standing Status:   Future    Standing Expiration Date:   07/17/2023   Retic Panel    Standing Status:   Future    Standing Expiration Date:   07/17/2023   Follow-up 6 months.  All questions were answered. The patient knows to call the clinic with any problems, questions or concerns.  Rickard Patience, MD, PhD [facility] Hematology Oncology 07/17/2022   HISTORY OF PRESENTING ILLNESS:   Patient has a history of seizure disorder/history of cerebral palsy Patient is a poor historian.  She was accompanied by her mother who has hearing deficiency and not able to provide much history.Marland Kitchen   History was mainly obtained from reviewing medical records.  06/14/2020, CT chest with  contrast showed a lobulated nodule with mildly spiculated margin in the posterior right chest persist and is suspicious for bronchogenic neoplasm based on morphologic repeat years Nodule along the minor fissure in the right chest measures 6 mm, nonspecific.  Moderately large hiatal hernia 11 mm distal common bile duct, previously 8 to 9 mm.  No pancreatic duct dilatation is visualized.  Prominence of the common bile duct on the prior study in this patient's post cholecystectomy.  Aortic atherosclerosis.  Patient was referred to establish care with oncology for further evaluation of the lung nodule.  She denies any cough, shortness of breath, hemoptysis, patient is a former smoker, and quit in 1982.  She reports only smoked for 4 months and mostly on weekends.  Patient has contractures and physical deformities which severely limits her  mobility.  She lives at home with parents.   Lung nodule  07/02/2020 no activity on PET scan. 01/04/2021, CT chest without contrast showed stable size of the lung nodule.  Likely benign. 01/07/22 CT chest without contrast showed showed stable/slightly smaller 1.4 cm posterior right lower lobe pulmonary nodule.  Likely benign.  New right upper lobe groundglass nodule with patchy groundglass opacity.   07/17/2022 CT chest wo contrast showed  1.  No suspicious pulmonary nodules. Previously demonstrated nodules are stable to decreased in size, consistent with benign findings. 2. Patchy ground-glass opacities in both lungs have mildly progressed posteriorly in the right upper lobe, but are non focal  and likely inflammatory/postinflammatory. 3. Moderate to large hiatal hernia, mildly increased in size from previous imaging. 4. No adenopathy or pleural effusion. 5. Aortic Atherosclerosis (ICD10-I70.0) and Emphysema   INTERVAL HISTORY JASMINEMARIE SHERRARD is a 67 y.o. female who has above history reviewed by me today presents for acute visit for anemia and lung nodule.   Today she  was accompanied by he partner.  She reports taking oral iron supplementation.  04/13/22 EGD showed gastritis, 04/21/22 colonoscopy negative. Small bowel enteroscopy negative.    Review of Systems  Unable to perform ROS: Other  Constitutional:  Positive for appetite change. Negative for fatigue and unexpected weight change.  Respiratory:  Negative for cough and shortness of breath.   Cardiovascular:  Negative for chest pain.  Gastrointestinal:  Negative for abdominal pain.  Musculoskeletal:  Negative for back pain.  Hematological:  Does not bruise/bleed easily.  Psychiatric/Behavioral:  Negative for confusion.     MEDICAL HISTORY:  Past Medical History:  Diagnosis Date   Anemia    Arthritis    knees   Cerebral palsy    Depression    Diabetes mellitus without complication    Diverticulosis    Dysphagia    Eczema    GERD (gastroesophageal reflux disease)    Glaucoma    Hemorrhoids    Hyperlipidemia    Hypothyroidism    Seizures    none in over 10 yrs    SURGICAL HISTORY: Past Surgical History:  Procedure Laterality Date   BREAST BIOPSY Right 03/22/2020   stereo bx, ribbon clip, path pending    CATARACT EXTRACTION W/PHACO Right 02/06/2016   Procedure: CATARACT EXTRACTION PHACO AND INTRAOCULAR LENS PLACEMENT (IOC);  Surgeon: Lockie Mola, MD;  Location: [facility];  Service: Ophthalmology;  Laterality: Right;   CATARACT EXTRACTION W/PHACO Left 03/05/2016   Procedure: CATARACT EXTRACTION PHACO AND INTRAOCULAR LENS PLACEMENT (IOC);  Surgeon: Lockie Mola, MD;  Location: [facility];  Service: Ophthalmology;  Laterality: Left;   CHOLECYSTECTOMY N/A 11/16/2015   Procedure: LAPAROSCOPIC CHOLECYSTECTOMY;  Surgeon: Tiney Rouge III, MD;  Location: [facility];  Service: General;  Laterality: N/A;  attempted cholangiogram   COLONOSCOPY WITH PROPOFOL N/A 12/25/2014   Procedure: COLONOSCOPY WITH PROPOFOL;  Surgeon: Wallace Cullens, MD;  Location: [facility];   Service: Gastroenterology;  Laterality: N/A;   COLONOSCOPY WITH PROPOFOL N/A 04/14/2019   Procedure: COLONOSCOPY WITH PROPOFOL;  Surgeon: Toledo, Boykin Nearing, MD;  Location: [facility] ENDOSCOPY;  Service: Gastroenterology;  Laterality: N/A;   COLONOSCOPY WITH PROPOFOL N/A 04/20/2022   Procedure: COLONOSCOPY WITH PROPOFOL;  Surgeon: Wyline Mood, MD;  Location: [facility] END[facility];  Service: Gastroenterology;  Laterality: N/A;   ENDOSCOPIC RETROGRADE CHOLANGIOPANCREATOGRAPHY (ERCP) WITH PROPOFOL N/A 11/18/2015   Procedure: ENDOSCOPIC RETROGRADE CHOLANGIOPANCREATOGRAPHY (ERCP) WITH PROPOFOL;  Surgeon: Midge Minium, MD;  Location: [facility] ENDOSCOPY;  Service: Endoscopy;  Laterality: N/A;   ESOPHAGOGASTRODUODENOSCOPY (EGD) WITH PROPOFOL N/A 04/14/2019   Procedure: ESOPHAGOGASTRODUODENOSCOPY (EGD) WITH PROPOFOL;  Surgeon: Toledo, Boykin Nearing, MD;  Location: [facility] END[facility];  Service: Gastroenterology;  Laterality: N/A;   ESOPHAGOGASTRODUODENOSCOPY (EGD) WITH PROPOFOL N/A 04/20/2022   Procedure: ESOPHAGOGASTRODUODENOSCOPY (EGD) WITH PROPOFOL;  Surgeon: Wyline Mood, MD;  Location: [facility] END[facility];  Service: Gastroenterology;  Laterality: N/A;   EYE SURGERY     Cataract Surgery  HERNIA REPAIR      SOCIAL HISTORY: Social History   Socioeconomic History   Marital status: Single    Spouse name: Not on file   Number of children: Not on file   Years of education: Not on file   Highest education level: Not on file  Occupational History   Not on file  Tobacco Use   Smoking status: Former    Types: Cigarettes    Quit date: 06/20/1980    Years since quitting: 42.1   Smokeless tobacco: Never   Tobacco comments:    smoked socially in early 1980s (reports for 4 months)  Vaping Use   Vaping Use: Never used  Substance and Sexual Activity   Alcohol use: No   Drug use: No   Sexual activity: Not Currently  Other Topics Concern   Not on file  Social History Narrative   Not on file   Social Determinants of Health    Financial Resource Strain: Not on file  Food Insecurity: No Food Insecurity (04/18/2022)   Hunger Vital Sign    Worried About Running Out of Food in the Last Year: Never true    Ran Out of Food in the Last Year: Never true  Transportation Needs: No Transportation Needs (04/18/2022)   PRAPARE - Administrator, Civil Service (Medical): No    Lack of Transportation (Non-Medical): No  Physical Activity: Not on file  Stress: Not on file  Social Connections: Not on file  Intimate Partner Violence: Not At Risk (04/18/2022)   Humiliation, Afraid, Rape, and Kick questionnaire    Fear of Current or Ex-Partner: No    Emotionally Abused: No    Physically Abused: No    Sexually Abused: No    FAMILY HISTORY: Family History  Problem Relation Age of Onset   High blood pressure Mother    Hyperlipidemia Mother    Diabetes Mellitus II Mother    Stroke Mother    Osteoporosis Mother    High blood pressure Father    Breast cancer Maternal Aunt    Stroke Maternal Grandfather    Diabetes Mellitus II Maternal Grandfather    Stroke Maternal Grandmother    Diabetes Mellitus II Maternal Grandmother     ALLERGIES:  is allergic to aminophylline, cinnamon, advil [ibuprofen], amoxicillin, latex, nsaids, penicillins, potassium, potassium-containing compounds, septra [sulfamethoxazole-trimethoprim], sulfa antibiotics, sulfasalazine, tape, and theophyllines.  MEDICATIONS:  Current Outpatient Medications  Medication Sig Dispense Refill   acetaminophen (TYLENOL) 325 MG tablet Take 325 mg by mouth every 6 (six) hours as needed.     alendronate (FOSAMAX) 70 MG tablet Take 70 mg by mouth once a week. Take with a full glass of water on an empty stomach.     ALPRAZolam (XANAX) 0.25 MG tablet Take 0.25 mg by mouth as needed for anxiety. Before pap smears     ascorbic acid (VITAMIN C) 500 MG tablet Take 1 tablet (500 mg total) by mouth daily. 30 tablet 2   atorvastatin (LIPITOR) 40 MG tablet Take 40 mg  by mouth daily.     calcium citrate-vitamin D (CITRACAL+D) 315-200 MG-UNIT per tablet Take 1 tablet by mouth 2 (two) times daily.     carbamazepine (TEGRETOL) 200 MG tablet Take 200 mg by mouth 3 (three) times daily.     Cholecalciferol 100 MCG (4000 UT) TABS Take 1,000 Units by mouth.     cyanocobalamin (VITAMIN B12) 500 MCG tablet Take 1 tablet (500 mcg total) by mouth daily. 90 tablet 0  esomeprazole (NEXIUM) 40 MG capsule Take 40 mg by mouth daily at 12 noon.     famotidine (PEPCID) 20 MG tablet Take 1 tablet by mouth 2 (two) times daily.     hydroxypropyl methylcellulose / hypromellose (ISOPTO TEARS / GONIOVISC) 2.5 % ophthalmic solution 1 drop as needed for dry eyes.     iron polysaccharides (NIFEREX) 150 MG capsule Take 1 capsule (150 mg total) by mouth 2 (two) times daily for 30 days, THEN 1 capsule (150 mg total) daily. 150 capsule 0   levothyroxine (SYNTHROID) 112 MCG tablet Take 112 mcg by mouth every morning.     magnesium oxide (MAG-OX) 400 MG tablet Take 1 tablet by mouth 3 (three) times daily.     naproxen (NAPROSYN) 375 MG tablet Take 375 mg by mouth 2 (two) times daily with a meal.     OLANZapine (ZYPREXA) 5 MG tablet Take 1 tablet (5 mg total) by mouth daily. 90 tablet 0   PARoxetine (PAXIL) 20 MG tablet Take 1 tablet (20 mg total) by mouth daily. 90 tablet 1   potassium chloride 20 MEQ/15ML (10%) SOLN Take 10 mEq by mouth daily.     vitamin E 400 UNIT capsule Take 400 Units by mouth daily.     OLANZapine (ZYPREXA) 10 MG tablet Take 1 tablet (10 mg total) by mouth at bedtime. 90 tablet 0   No current facility-administered medications for this visit.     PHYSICAL EXAMINATION: ECOG PERFORMANCE STATUS: 2 - Symptomatic, <50% confined to bed Vitals:   07/17/22 1042  BP: (!) 109/55  Pulse: 98  Resp: 18  Temp: (!) 97.3 F (36.3 C)  SpO2: 97%   There were no vitals filed for this visit.    Physical Exam Constitutional:      General: She is not in acute distress.     Comments: Patient sits in the wheelchair  HENT:     Head: Normocephalic and atraumatic.  Eyes:     General: No scleral icterus. Cardiovascular:     Rate and Rhythm: Normal rate and regular rhythm.     Heart sounds: Normal heart sounds.  Pulmonary:     Effort: Pulmonary effort is normal. No respiratory distress.     Breath sounds: No wheezing.  Abdominal:     General: Bowel sounds are normal. There is no distension.     Palpations: Abdomen is soft.  Musculoskeletal:        General: Deformity present.     Cervical back: Normal range of motion and neck supple.     Comments: Contracture of left upper extremity in the left lower extremity.  Skin:    General: Skin is warm and dry.     Coloration: Skin is pale.     Findings: No erythema or rash.  Neurological:     Mental Status: She is alert. Mental status is at baseline.     LABORATORY DATA:  I have reviewed the data as listed    Latest Ref Rng & Units 07/17/2022   11:17 AM 04/21/2022    3:48 AM 04/20/2022    4:40 AM  CBC  WBC 4.0 - 10.5 K/uL 9.8  8.3  8.3   Hemoglobin 12.0 - 15.0 g/dL 16.1  8.2  8.1   Hematocrit 36.0 - 46.0 % 32.2  27.2  26.4   Platelets 150 - 400 K/uL 264  391  398       Latest Ref Rng & Units 04/21/2022    3:48 AM 04/20/2022  4:40 AM 04/19/2022    4:52 AM  CMP  Glucose 70 - 99 mg/dL 409  811  914   BUN 8 - 23 mg/dL Creatinine 0.44 - 1.00 mg/dL 7.82  9.56  2.13   Sodium 135 - 145 mmol/L 139  141  141   Potassium 3.5 - 5.1 mmol/L 3.4  3.6  4.0   Chloride 98 - 111 mmol/L 108  111  109   CO2 22 - 32 mmol/L Calcium 8.9 - 10.3 mg/dL 8.3  8.2  8.2       RADIOGRAPHIC STUDIES: I have personally reviewed the radiological images as listed and agreed with the findings in the report. CT Chest Wo Contrast  Result Date: 07/17/2022 CLINICAL DATA:  Follow up pulmonary nodules. Intermittent chest congestion. No history of malignancy. Former smoker. EXAM: CT CHEST WITHOUT CONTRAST TECHNIQUE:  Multidetector CT imaging of the chest was performed following the standard protocol without IV contrast. RADIATION DOSE REDUCTION: This exam was performed according to the departmental dose-optimization program which includes automated exposure control, adjustment of the mA and/or kV according to patient size and/or use of iterative reconstruction technique. COMPARISON:  Chest CT 01/06/2021 and 01/04/2021. Older chest CTs are correlated as well as a PET-CT 07/02/2020. FINDINGS: Cardiovascular: Mild aortic and great vessel atherosclerosis again noted. The heart size is normal. There is no pericardial effusion. Mediastinum/Nodes: There are no enlarged mediastinal, hilar or axillary lymph nodes.Hilar assessment is limited by the lack of intravenous contrast, although the hilar contours appear unchanged. Moderate to large hiatal hernia has mildly enlarged. The thyroid gland and trachea appear unremarkable. Lungs/Pleura: No pleural effusion or pneumothorax. Mild centrilobular emphysema. A well-circumscribed 4 mm right upper lobe nodule on image 72/3 is unchanged from previous imaging, consistent with a benign finding. The subpleural density peripherally in the right upper lobe along the posterior aspect of the minor fissure has not enlarged from 06/14/2020 and peers triangular on sagittal image 25/7, favored to reflect an area of postinflammatory scarring. Partially calcified subpleural nodule in the right lower lobe measuring 10 mm on image 108/3 has decreased in size from baseline abdominal CT of 03/28/2020. No new, enlarging or suspicious pulmonary nodules are identified. Subpleural scarring is present anteriorly in the left upper lobe, unchanged. There are patchy, non focal ground-glass opacities in both lungs which have mildly progressed posteriorly in the right upper lobe. No confluent airspace disease. Upper abdomen: The visualized upper abdomen appears stable, without significant findings. Musculoskeletal/Chest  wall: There is no chest wall mass or suspicious osseous finding. Mild multilevel spondylosis. IMPRESSION: 1. No suspicious pulmonary nodules. Previously demonstrated nodules are stable to decreased in size, consistent with benign findings. 2. Patchy ground-glass opacities in both lungs have mildly progressed posteriorly in the right upper lobe, but are non focal and likely inflammatory/postinflammatory. 3. Moderate to large hiatal hernia, mildly increased in size from previous imaging. 4. No adenopathy or pleural effusion. 5. Aortic Atherosclerosis (ICD10-I70.0) and Emphysema (ICD10-J43.9). Electronically Signed   By: Carey Bullocks M.D.   On: 07/17/2022 08:52

## 2022-07-24 ENCOUNTER — Other Ambulatory Visit: Payer: Self-pay | Admitting: Psychiatry

## 2022-07-29 ENCOUNTER — Other Ambulatory Visit: Payer: Self-pay | Admitting: Psychiatry

## 2022-08-04 ENCOUNTER — Other Ambulatory Visit: Payer: Self-pay | Admitting: Psychiatry

## 2022-08-30 ENCOUNTER — Encounter: Payer: Self-pay | Admitting: Oncology

## 2022-09-02 ENCOUNTER — Other Ambulatory Visit: Payer: Self-pay | Admitting: Psychiatry

## 2022-09-06 ENCOUNTER — Other Ambulatory Visit: Payer: Self-pay | Admitting: Psychiatry

## 2022-09-08 ENCOUNTER — Other Ambulatory Visit: Payer: Self-pay | Admitting: Psychiatry

## 2022-09-08 ENCOUNTER — Telehealth: Payer: Self-pay

## 2022-09-08 MED ORDER — OLANZAPINE 5 MG PO TABS: 5.0000 mg | ORAL_TABLET | Freq: Every day | ORAL | 0 refills | Status: DC

## 2022-09-08 NOTE — Telephone Encounter (Signed)
  pt left a message that she needs a refill on the olanxapine 5mg . pt was last seen on 4-23 next appt 6-13   Prescribing Provider Encounter Provider  Neysa Hotter, MD Neysa Hotter, MD   Outpatient Medication Detail   Disp Refills Start End   OLANZapine [facility]) 5 MG tablet 90 tablet 0 06/15/2022 09/13/2022   Sig - Route: Take 1 tablet (5 mg total) by mouth daily. - Oral   Sent to pharmacy as: OLANZapine (ZYPREXA) 5 MG tablet   Notes to Pharmacy: This medication is scheduled to be started on 3/17. Please fill it after 3/10 to avoid accumulating excess medication.   E-Prescribing Status: Receipt confirmed by pharmacy (05/18/2022 10:59 AM EST)

## 2022-09-08 NOTE — Telephone Encounter (Signed)
Ordered

## 2022-09-09 NOTE — Telephone Encounter (Signed)
pt notified that rx sent

## 2022-09-19 ENCOUNTER — Other Ambulatory Visit: Payer: Self-pay | Admitting: Psychiatry

## 2022-09-25 ENCOUNTER — Encounter: Payer: Self-pay | Admitting: Oncology

## 2022-09-25 NOTE — Progress Notes (Signed)
BH MD/PA/NP OP Progress Note  09/29/2022 4:08 PM Michele James  MRN:  [id number]  Chief Complaint:  Chief Complaint  Patient presents with   Follow-up   HPI:  This is a follow-up appointment for schizophrenia, depression.  She states that she has strange experience.  She sleeps during the daytime.  She wonders if it is seizure or blackout.  Her father has to wake her up.  She bites her tongue sometimes.  She has not seen her neurologist.  Although she feels wary of contacting her neurology, she agrees to discuss this with her PCP.  She spends most of the time watching TV.  She thinks her mood is good, and denies any significant anxiety.  When she was asked if any concern, she abruptly asks to see her screening sheets. She also brings paper printed out in 2023 (medication list) and shares it with this provider.  Although she has occasional AH of hearing people talking outside, she denies any current concern about this.  She denies VH.  She has good appetite.  She denies feeling depressed or anxiety.  She denies SI.   Her father presents to the visit.  He does not thinks she has seizure. However, he thinks that "function of her mind is going fast".  When he was asked to elaborate, he states that she asked him the same questions 15 times a day.  She also tends to have memory issues.  He states that she was brought to the bed around 4 pm by the caregiver, although he asks to do it later in the day. She sleeps with snoring in a second, and sleeps until in the morning. Although reducing the olanzapine dose was suggested, he feels comfortable staying on the same dose at this time.   Wt Readings from Last 3 Encounters:  09/29/22 130 lb (59 kg)  04/18/22 130 lb (59 kg)  04/18/22 130 lb (59 kg)    Visit Diagnosis:    ICD-10-CM   1. Schizophrenia, unspecified type (HCC)  F20.9     2. MDD (major depressive disorder), recurrent, in partial remission (HCC)  F33.41       Past Psychiatric History:  Please see initial evaluation for full details. I have reviewed the history. No updates at this time.     Past Medical History:  Past Medical History:  Diagnosis Date   Anemia    Arthritis    knees   Cerebral palsy (HCC)    Depression    Diabetes mellitus without complication (HCC)    Diverticulosis    Dysphagia    Eczema    GERD (gastroesophageal reflux disease)    Glaucoma    Hemorrhoids    Hyperlipidemia    Hypothyroidism    Seizures (HCC)    none in over 10 yrs    Past Surgical History:  Procedure Laterality Date   BREAST BIOPSY Right 03/22/2020   stereo bx, ribbon clip, path pending    CATARACT EXTRACTION W/PHACO Right 02/06/2016   Procedure: CATARACT EXTRACTION PHACO AND INTRAOCULAR LENS PLACEMENT (IOC);  Surgeon: Lockie Mola, MD;  Location: [facility];  Service: Ophthalmology;  Laterality: Right;   CATARACT EXTRACTION W/PHACO Left 03/05/2016   Procedure: CATARACT EXTRACTION PHACO AND INTRAOCULAR LENS PLACEMENT (IOC);  Surgeon: Lockie Mola, MD;  Location: [facility];  Service: Ophthalmology;  Laterality: Left;   CHOLECYSTECTOMY N/A 11/16/2015   Procedure: LAPAROSCOPIC CHOLECYSTECTOMY;  Surgeon: Tiney Rouge III, MD;  Location: [facility] ORS;  Service: General;  Laterality:  N/A;  attempted cholangiogram   COLONOSCOPY WITH PROPOFOL N/A 12/25/2014   Procedure: COLONOSCOPY WITH PROPOFOL;  Surgeon: Wallace Cullens, MD;  Location: [facility];  Service: Gastroenterology;  Laterality: N/A;   COLONOSCOPY WITH PROPOFOL N/A 04/14/2019   Procedure: COLONOSCOPY WITH PROPOFOL;  Surgeon: Toledo, Boykin Nearing, MD;  Location: [facility] ENDOSCOPY;  Service: Gastroenterology;  Laterality: N/A;   COLONOSCOPY WITH PROPOFOL N/A 04/20/2022   Procedure: COLONOSCOPY WITH PROPOFOL;  Surgeon: Wyline Mood, MD;  Location: [facility] ENDOSCOPY;  Service: Gastroenterology;  Laterality: N/A;   ENDOSCOPIC RETROGRADE CHOLANGIOPANCREATOGRAPHY (ERCP) WITH PROPOFOL N/A 11/18/2015   Procedure: ENDOSCOPIC  RETROGRADE CHOLANGIOPANCREATOGRAPHY (ERCP) WITH PROPOFOL;  Surgeon: Midge Minium, MD;  Location: [facility] ENDOSCOPY;  Service: Endoscopy;  Laterality: N/A;   ESOPHAGOGASTRODUODENOSCOPY (EGD) WITH PROPOFOL N/A 04/14/2019   Procedure: ESOPHAGOGASTRODUODENOSCOPY (EGD) WITH PROPOFOL;  Surgeon: Toledo, Boykin Nearing, MD;  Location: [facility] ENDOSCOPY;  Service: Gastroenterology;  Laterality: N/A;   ESOPHAGOGASTRODUODENOSCOPY (EGD) WITH PROPOFOL N/A 04/20/2022   Procedure: ESOPHAGOGASTRODUODENOSCOPY (EGD) WITH PROPOFOL;  Surgeon: Wyline Mood, MD;  Location: [facility] END[facility]COPY;  Service: Gastroenterology;  Laterality: N/A;   EYE SURGERY     Cataract Surgery    HERNIA REPAIR      Family Psychiatric History: Please see initial evaluation for full details. I have reviewed the history. No updates at this time.     Family History:  Family History  Problem Relation Age of Onset   High blood pressure Mother    Hyperlipidemia Mother    Diabetes Mellitus II Mother    Stroke Mother    Osteoporosis Mother    High blood pressure Father    Breast cancer Maternal Aunt    Stroke Maternal Grandfather    Diabetes Mellitus II Maternal Grandfather    Stroke Maternal Grandmother    Diabetes Mellitus II Maternal Grandmother     Social History:  Social History   Socioeconomic History   Marital status: Single    Spouse name: Not on file   Number of children: Not on file   Years of education: Not on file   Highest education level: Not on file  Occupational History   Not on file  Tobacco Use   Smoking status: Former    Types: Cigarettes    Quit date: 06/20/1980    Years since quitting: 42.3   Smokeless tobacco: Never   Tobacco comments:    smoked socially in early 1980s (reports for 4 months)  Vaping Use   Vaping Use: Never used  Substance and Sexual Activity   Alcohol use: No   Drug use: No   Sexual activity: Not Currently  Other Topics Concern   Not on file  Social History Narrative   Not on file   Social  Determinants of Health   Financial Resource Strain: Not on file  Food Insecurity: No Food Insecurity (04/18/2022)   Hunger Vital Sign    Worried About Running Out of Food in the Last Year: Never true    Ran Out of Food in the Last Year: Never true  Transportation Needs: No Transportation Needs (04/18/2022)   PRAPARE - Administrator, Civil Service (Medical): No    Lack of Transportation (Non-Medical): No  Physical Activity: Not on file  Stress: Not on file  Social Connections: Not on file    Allergies:  Allergies  Allergen Reactions   Aminophylline Other (See Comments)    Headache   Cinnamon Other (See Comments)    Stomachache    Advil [Ibuprofen] Itching and  Rash    Pt states she can take ibuprofen   Amoxicillin Rash   Latex Rash   Nsaids Rash   Penicillins Nausea And Vomiting   Potassium Nausea And Vomiting and Nausea Only   Potassium-Containing Compounds Nausea And Vomiting   Septra [Sulfamethoxazole-Trimethoprim] Rash   Sulfa Antibiotics Rash   Sulfasalazine Rash   Tape Rash    Paper tape ok   Theophyllines Other (See Comments)    Headache     Metabolic Disorder Labs: No results found for: "HGBA1C", "MPG" No results found for: "PROLACTIN" No results found for: "CHOL", "TRIG", "HDL", "CHOLHDL", "VLDL", "LDLCALC" Lab Results  Component Value Date   TSH 0.686 03/17/2021   TSH 0.172 (L) 05/16/2020    Therapeutic Level Labs: No results found for: "LITHIUM" No results found for: "VALPROATE" No results found for: "CBMZ"  Current Medications: Current Outpatient Medications  Medication Sig Dispense Refill   acetaminophen (TYLENOL) 325 MG tablet Take 325 mg by mouth every 6 (six) hours as needed.     alendronate (FOSAMAX) 70 MG tablet Take 70 mg by mouth once a week. Take with a full glass of water on an empty stomach.     ALPRAZolam (XANAX) 0.25 MG tablet Take 0.25 mg by mouth as needed for anxiety. Before pap smears     atorvastatin (LIPITOR) 40 MG  tablet Take 40 mg by mouth daily.     calcium citrate-vitamin D (CITRACAL+D) 315-200 MG-UNIT per tablet Take 1 tablet by mouth 2 (two) times daily.     carbamazepine (TEGRETOL) 200 MG tablet Take 200 mg by mouth 3 (three) times daily.     Cholecalciferol 100 MCG (4000 UT) TABS Take 1,000 Units by mouth.     esomeprazole (NEXIUM) 40 MG capsule Take 40 mg by mouth daily at 12 noon.     famotidine (PEPCID) 20 MG tablet Take 1 tablet by mouth 2 (two) times daily.     hydroxypropyl methylcellulose / hypromellose (ISOPTO TEARS / GONIOVISC) 2.5 % ophthalmic solution 1 drop as needed for dry eyes.     levothyroxine (SYNTHROID) 112 MCG tablet Take 112 mcg by mouth every morning.     magnesium oxide (MAG-OX) 400 MG tablet Take 1 tablet by mouth 3 (three) times daily.     naproxen (NAPROSYN) 375 MG tablet Take 375 mg by mouth 2 (two) times daily with a meal.     OLANZapine (ZYPREXA) 5 MG tablet Take 1 tablet (5 mg total) by mouth daily. 90 tablet 0   PARoxetine (PAXIL) 20 MG tablet Take 1 tablet (20 mg total) by mouth daily. 90 tablet 1   potassium chloride 20 MEQ/15ML (10%) SOLN Take 10 mEq by mouth daily.     vitamin E 400 UNIT capsule Take 400 Units by mouth daily.     iron polysaccharides (NIFEREX) 150 MG capsule Take 1 capsule (150 mg total) by mouth 2 (two) times daily for 30 days, THEN 1 capsule (150 mg total) daily. 150 capsule 0   [START ON 10/15/2022] OLANZapine (ZYPREXA) 10 MG tablet Take 1 tablet (10 mg total) by mouth at bedtime. 90 tablet 0   No current facility-administered medications for this visit.     Musculoskeletal: Strength & Muscle Tone: within normal limits Gait & Station:  in a wheel chair Patient leans: N/A  Psychiatric Specialty Exam: Review of Systems  Psychiatric/Behavioral:  Positive for hallucinations. Negative for agitation, behavioral problems, confusion, decreased concentration, dysphoric mood, self-injury, sleep disturbance and suicidal ideas. The patient is not  nervous/anxious and is not hyperactive.   All other systems reviewed and are negative.   Blood pressure 114/68, pulse 86, temperature 98 F (36.7 C), temperature source Skin, height 5\' 4"  (1.626 m), weight 130 lb (59 kg).Body mass index is 22.31 kg/m.  General Appearance: Fairly Groomed  Eye Contact:  Good  Speech:  Clear and Coherent  Volume:  Normal  Mood:   good  Affect:  Appropriate, Blunt, and Congruent  Thought Process:  Coherent  Orientation:  Full (Time, Place, and Person)  Thought Content: Logical and Illogical at times   Suicidal Thoughts:  No  Homicidal Thoughts:  No  Memory:  Immediate;   Good  Judgement:  Fair  Insight:  Shallow  Psychomotor Activity:  Normal  Concentration:  Concentration: Good and Attention Span: Good  Recall:  Good  Fund of Knowledge: Good  Language: Good  Akathisia:  No  Handed:  Right  AIMS (if indicated): not done  Assets:  Communication Skills Desire for Improvement  ADL's:  Intact  Cognition: WNL  Sleep:  Good   Screenings: GAD-7    Flowsheet Row Office Visit from 12/23/2021 in [facility]  Total GAD-7 Score 1      PHQ2-9    Flowsheet Row Office Visit from 02/03/2022 in [facility] Office Visit from 12/23/2021 in [facility] Office Visit from 11/04/2021 in [facility]  PHQ-2 Total Score 0 0 0      Flowsheet Row ED to Hosp-Admission (Discharged) from 04/18/2022 in [facility] (1[facility]) Office Visit from 02/03/2022 in [facility] ED from 03/17/2021 in [facility] Emergency Department at [facility]  C-SSRS RISK CATEGORY No Risk No Risk No Risk        Assessment and Plan:  Michele James is a 67 y.o. year old female with a history of depression, anxiety, seizure disorder, cerebral palsy, GERD,  osteoporosis , who presents for follow up appointment for below.   1. Schizophrenia, unspecified type (HCC) 2. MDD (major depressive disorder), recurrent, in partial remission (HCC) Acute stressors include: n/a Chronic stressors include: unemployment, limited care (elderly father, RN twice a week)   Transferred from [facility]/Dr. Johny Drilling. Admitted a few times, first at age 50 for depression.  According to the chart review, her father reports history of schizophrenia. No known developmental or intellectual disability. Collateral is limited due to her elderly father, who has memory loss. (Originally on olanzapine 2.5 mg in AM, 10 mg qhs, Paxil 20 mg daily)   She demonstrates concrete thought process, and occasional disorganization, although she is calm through the entire interview.  Although she has occasional AH, it is not debilitating, and that there has been overall improvement in somatization since uptitration of olanzapine.  Although there is a concern of possible drowsiness from this medication, the patient has strong preference still stay at the current dose.  Will continue dose of olanzapine to target schizophrenia, and Paxil for depression.    # cognition  Mini cog- 1/3 (unable to tell date, wrote two 12 in clock) Functional Status   IADL: Independent in the following:            Requires assistance with the following: managing finances, medications, driving ADL  Independent in the following: walking          Requires assistance with the following:bathing and hygiene, feeding, continence, grooming and toileting,  Folate,  Vitamin B12 (wnl 42/2024), TSH low, fT4 0.99 05/2012) Images Head CT 02/2021 Brain: Normal anatomic configuration. Moderate periventricular white matter changes are present likely reflecting the sequela of small vessel ischemia, stable since prior examination. 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 No abnormal mass effect or midline shift. No evidence of acute intracranial hemorrhage or infarct. Ventricular size is normal. Cerebellum unremarkable. Neuropsych assessment:  Etiology: r/o vascular   No change. Her father reports some concern about memory loss. IADL is limited primarily due to her condition of cerebral palsy.  Will continue to assess as needed.    Plan Continue olanzapine 5 mg in the a.m., 10 mg at night - EKG QTc 386 msec 03/2022, hr 71 Continue Paxil 20 mg daily (has meds per patient) Next appointment: 8/22 at 10 am, IP - on carbamazepine 200 mg TID for seizure    The patient demonstrates the following risk factors for suicide: Chronic risk factors for suicide include: psychiatric disorder of depression . Acute risk factors for suicide include: unemployment. Protective factors for this patient include: positive social support and hope for the future. Considering these factors, the overall suicide risk at this point appears to be low. Patient is appropriate for outpatient follow up.   Collaboration of Care: Collaboration of Care: Other reviewed notes in Epic  Patient/Guardian was advised Release of Information must be obtained prior to any record release in order to collaborate their care with an outside provider. Patient/Guardian was advised if they have not already done so to contact the registration department to sign all necessary forms in order for [place] to release information regarding their care.   Consent: Patient/Guardian gives verbal consent for treatment and assignment of benefits for services provided during this visit. Patient/Guardian expressed understanding and agreed to proceed.    Neysa Hotter, MD 09/29/2022, 4:08 PM

## 2022-09-29 ENCOUNTER — Ambulatory Visit (INDEPENDENT_AMBULATORY_CARE_PROVIDER_SITE_OTHER): Payer: Medicare Other | Admitting: Psychiatry

## 2022-09-29 ENCOUNTER — Encounter: Payer: Self-pay | Admitting: Psychiatry

## 2022-09-29 VITALS — BP 114/68 | HR 86 | Temp 98.0°F | Ht 64.0 in | Wt 130.0 lb

## 2022-09-29 DIAGNOSIS — F209 Schizophrenia, unspecified: Secondary | ICD-10-CM | POA: Diagnosis not present

## 2022-09-29 DIAGNOSIS — F3341 Major depressive disorder, recurrent, in partial remission: Secondary | ICD-10-CM | POA: Diagnosis not present

## 2022-09-29 MED ORDER — OLANZAPINE 10 MG PO TABS: 10.0000 mg | ORAL_TABLET | Freq: Every day | ORAL | 0 refills | Status: DC

## 2022-10-07 ENCOUNTER — Other Ambulatory Visit: Payer: Self-pay | Admitting: Family Medicine

## 2022-10-07 ENCOUNTER — Encounter: Payer: Self-pay | Admitting: Oncology

## 2022-10-07 DIAGNOSIS — Z1231 Encounter for screening mammogram for malignant neoplasm of breast: Secondary | ICD-10-CM

## 2022-10-18 ENCOUNTER — Emergency Department: Payer: Medicare Other

## 2022-10-18 ENCOUNTER — Encounter: Payer: Self-pay | Admitting: Emergency Medicine

## 2022-10-18 ENCOUNTER — Other Ambulatory Visit: Payer: Self-pay

## 2022-10-18 ENCOUNTER — Inpatient Hospital Stay
Admission: EM | Admit: 2022-10-18 | Discharge: 2022-10-22 | DRG: 189 | Disposition: A | Discharge status: Home Health | Payer: Medicare Other | Attending: Internal Medicine | Admitting: Internal Medicine

## 2022-10-18 DIAGNOSIS — R569 Unspecified convulsions: Secondary | ICD-10-CM

## 2022-10-18 DIAGNOSIS — E039 Hypothyroidism, unspecified: Secondary | ICD-10-CM | POA: Diagnosis present

## 2022-10-18 DIAGNOSIS — Z880 Allergy status to penicillin: Secondary | ICD-10-CM

## 2022-10-18 DIAGNOSIS — Z888 Allergy status to other drugs, medicaments and biological substances status: Secondary | ICD-10-CM

## 2022-10-18 DIAGNOSIS — J9601 Acute respiratory failure with hypoxia: Principal | ICD-10-CM | POA: Diagnosis present

## 2022-10-18 DIAGNOSIS — Z9842 Cataract extraction status, left eye: Secondary | ICD-10-CM

## 2022-10-18 DIAGNOSIS — A419 Sepsis, unspecified organism: Principal | ICD-10-CM

## 2022-10-18 DIAGNOSIS — D500 Iron deficiency anemia secondary to blood loss (chronic): Secondary | ICD-10-CM | POA: Diagnosis not present

## 2022-10-18 DIAGNOSIS — M810 Age-related osteoporosis without current pathological fracture: Secondary | ICD-10-CM | POA: Diagnosis present

## 2022-10-18 DIAGNOSIS — K921 Melena: Secondary | ICD-10-CM | POA: Diagnosis not present

## 2022-10-18 DIAGNOSIS — Z83438 Family history of other disorder of lipoprotein metabolism and other lipidemia: Secondary | ICD-10-CM

## 2022-10-18 DIAGNOSIS — Z961 Presence of intraocular lens: Secondary | ICD-10-CM | POA: Diagnosis present

## 2022-10-18 DIAGNOSIS — E872 Acidosis, unspecified: Secondary | ICD-10-CM

## 2022-10-18 DIAGNOSIS — Z91040 Latex allergy status: Secondary | ICD-10-CM

## 2022-10-18 DIAGNOSIS — I10 Essential (primary) hypertension: Secondary | ICD-10-CM | POA: Diagnosis present

## 2022-10-18 DIAGNOSIS — Z79899 Other long term (current) drug therapy: Secondary | ICD-10-CM

## 2022-10-18 DIAGNOSIS — Z87891 Personal history of nicotine dependence: Secondary | ICD-10-CM

## 2022-10-18 DIAGNOSIS — R1310 Dysphagia, unspecified: Secondary | ICD-10-CM | POA: Diagnosis not present

## 2022-10-18 DIAGNOSIS — M25562 Pain in left knee: Secondary | ICD-10-CM | POA: Diagnosis present

## 2022-10-18 DIAGNOSIS — R197 Diarrhea, unspecified: Secondary | ICD-10-CM

## 2022-10-18 DIAGNOSIS — W19XXXA Unspecified fall, initial encounter: Secondary | ICD-10-CM | POA: Diagnosis present

## 2022-10-18 DIAGNOSIS — Z823 Family history of stroke: Secondary | ICD-10-CM

## 2022-10-18 DIAGNOSIS — F2081 Schizophreniform disorder: Secondary | ICD-10-CM | POA: Insufficient documentation

## 2022-10-18 DIAGNOSIS — Z9841 Cataract extraction status, right eye: Secondary | ICD-10-CM

## 2022-10-18 DIAGNOSIS — E785 Hyperlipidemia, unspecified: Secondary | ICD-10-CM | POA: Diagnosis present

## 2022-10-18 DIAGNOSIS — E119 Type 2 diabetes mellitus without complications: Secondary | ICD-10-CM

## 2022-10-18 DIAGNOSIS — Z91018 Allergy to other foods: Secondary | ICD-10-CM

## 2022-10-18 DIAGNOSIS — J189 Pneumonia, unspecified organism: Secondary | ICD-10-CM | POA: Insufficient documentation

## 2022-10-18 DIAGNOSIS — Z9049 Acquired absence of other specified parts of digestive tract: Secondary | ICD-10-CM

## 2022-10-18 DIAGNOSIS — Z803 Family history of malignant neoplasm of breast: Secondary | ICD-10-CM

## 2022-10-18 DIAGNOSIS — Z8262 Family history of osteoporosis: Secondary | ICD-10-CM

## 2022-10-18 DIAGNOSIS — R7989 Other specified abnormal findings of blood chemistry: Secondary | ICD-10-CM | POA: Insufficient documentation

## 2022-10-18 DIAGNOSIS — Z833 Family history of diabetes mellitus: Secondary | ICD-10-CM

## 2022-10-18 DIAGNOSIS — Z1152 Encounter for screening for COVID-19: Secondary | ICD-10-CM

## 2022-10-18 DIAGNOSIS — G809 Cerebral palsy, unspecified: Secondary | ICD-10-CM | POA: Diagnosis present

## 2022-10-18 DIAGNOSIS — Z7983 Long term (current) use of bisphosphonates: Secondary | ICD-10-CM

## 2022-10-18 DIAGNOSIS — G40909 Epilepsy, unspecified, not intractable, without status epilepticus: Secondary | ICD-10-CM | POA: Diagnosis present

## 2022-10-18 DIAGNOSIS — Z79890 Hormone replacement therapy (postmenopausal): Secondary | ICD-10-CM

## 2022-10-18 LAB — CBC WITH DIFFERENTIAL/PLATELET
Abs Immature Granulocytes: 0.11 10*3/uL — ABNORMAL HIGH (ref 0.00–0.07)
Basophils Absolute: 0.1 10*3/uL (ref 0.0–0.1)
Basophils Relative: 1 %
Eosinophils Absolute: 0.3 10*3/uL (ref 0.0–0.5)
Eosinophils Relative: 3 %
HCT: 25.6 % — ABNORMAL LOW (ref 36.0–46.0)
Hemoglobin: 8.1 g/dL — ABNORMAL LOW (ref 12.0–15.0)
Immature Granulocytes: 1 %
Lymphocytes Relative: 7 %
Lymphs Abs: 0.8 10*3/uL (ref 0.7–4.0)
MCH: 29.6 pg (ref 26.0–34.0)
MCHC: 31.6 g/dL (ref 30.0–36.0)
MCV: 93.4 fL (ref 80.0–100.0)
Monocytes Absolute: 2 10*3/uL — ABNORMAL HIGH (ref 0.1–1.0)
Monocytes Relative: 17 %
Neutro Abs: 9 10*3/uL — ABNORMAL HIGH (ref 1.7–7.7)
Neutrophils Relative %: 71 %
Platelets: 394 10*3/uL (ref 150–400)
RBC: 2.74 MIL/uL — ABNORMAL LOW (ref 3.87–5.11)
RDW: 13.2 % (ref 11.5–15.5)
WBC: 12.3 10*3/uL — ABNORMAL HIGH (ref 4.0–10.5)
nRBC: 0 % (ref 0.0–0.2)

## 2022-10-18 LAB — COMPREHENSIVE METABOLIC PANEL
ALT: 18 U/L (ref 0–44)
AST: 19 U/L (ref 15–41)
Albumin: 2.5 g/dL — ABNORMAL LOW (ref 3.5–5.0)
Alkaline Phosphatase: 82 U/L (ref 38–126)
Anion gap: 6 (ref 5–15)
BUN: 18 mg/dL (ref 8–23)
CO2: 26 mmol/L (ref 22–32)
Calcium: 8.1 mg/dL — ABNORMAL LOW (ref 8.9–10.3)
Chloride: 99 mmol/L (ref 98–111)
Creatinine, Ser: 0.43 mg/dL — ABNORMAL LOW (ref 0.44–1.00)
GFR, Estimated: >60 mL/min (ref >60)
Glucose, Bld: 139 mg/dL — ABNORMAL HIGH (ref 70–99)
Potassium: 3.9 mmol/L (ref 3.5–5.1)
Sodium: 131 mmol/L — ABNORMAL LOW (ref 135–145)
Total Bilirubin: 0.6 mg/dL (ref 0.3–1.2)
Total Protein: 6.3 g/dL — ABNORMAL LOW (ref 6.5–8.1)

## 2022-10-18 LAB — PROCALCITONIN: Procalcitonin: <0.1 ng/mL

## 2022-10-18 LAB — IRON AND TIBC
Iron: 24 ug/dL — ABNORMAL LOW (ref 28–170)
Saturation Ratios: 9 % — ABNORMAL LOW (ref 10.4–31.8)
TIBC: 274 ug/dL (ref 250–450)
UIBC: 250 ug/dL

## 2022-10-18 LAB — RESP PANEL BY RT-PCR (RSV, FLU A&B, COVID)  RVPGX2
Influenza A by PCR: NEGATIVE
Influenza B by PCR: NEGATIVE
Resp Syncytial Virus by PCR: NEGATIVE
SARS Coronavirus 2 by RT PCR: NEGATIVE

## 2022-10-18 LAB — LACTIC ACID, PLASMA
Lactic Acid, Venous: 2.2 mmol/L (ref 0.5–1.9)
Lactic Acid, Venous: 0.8 mmol/L (ref 0.5–1.9)

## 2022-10-18 LAB — TROPONIN I (HIGH SENSITIVITY): Troponin I (High Sensitivity): 34 ng/L — ABNORMAL HIGH (ref <18)

## 2022-10-18 LAB — PROTIME-INR
INR: 1.1 (ref 0.8–1.2)
Prothrombin Time: 14 seconds (ref 11.4–15.2)

## 2022-10-18 LAB — FERRITIN: Ferritin: 25 ng/mL (ref 11–307)

## 2022-10-18 MED ORDER — IOHEXOL 350 MG/ML SOLN
75.0000 mL | Freq: Once | INTRAVENOUS | Status: AC | PRN
  Administered 2022-10-18: 75 mL via INTRAVENOUS

## 2022-10-18 MED ORDER — PAROXETINE HCL 20 MG PO TABS
20.0000 mg | ORAL_TABLET | Freq: Every day | ORAL | Status: DC
  Administered 2022-10-19 – 2022-10-22 (×4): 20 mg via ORAL
  Filled 2022-10-18 (×4): qty 1

## 2022-10-18 MED ORDER — SODIUM CHLORIDE 0.9 % IV SOLN
500.0000 mg | INTRAVENOUS | Status: DC
  Administered 2022-10-18: 500 mg via INTRAVENOUS
  Filled 2022-10-18 (×2): qty 5

## 2022-10-18 MED ORDER — SODIUM CHLORIDE 0.9 % IV SOLN
2.0000 g | INTRAVENOUS | Status: DC
  Administered 2022-10-18: 2 g via INTRAVENOUS
  Filled 2022-10-18: qty 20

## 2022-10-18 MED ORDER — GUAIFENESIN ER 600 MG PO TB12
600.0000 mg | ORAL_TABLET | Freq: Two times a day (BID) | ORAL | Status: DC
  Administered 2022-10-18 – 2022-10-22 (×8): 600 mg via ORAL
  Filled 2022-10-18 (×8): qty 1

## 2022-10-18 MED ORDER — ATORVASTATIN CALCIUM 20 MG PO TABS
40.0000 mg | ORAL_TABLET | Freq: Every day | ORAL | Status: DC
  Administered 2022-10-19 – 2022-10-22 (×4): 40 mg via ORAL
  Filled 2022-10-18 (×4): qty 2

## 2022-10-18 MED ORDER — SODIUM CHLORIDE 0.9 % IV BOLUS (SEPSIS)
1000.0000 mL | Freq: Once | INTRAVENOUS | Status: AC
  Administered 2022-10-18: 1000 mL via INTRAVENOUS

## 2022-10-18 MED ORDER — ACETAMINOPHEN 160 MG/5ML PO SOLN: 650.0000 mg | Freq: Four times a day (QID) | ORAL | Status: DC | PRN

## 2022-10-18 MED ORDER — LEVOTHYROXINE SODIUM 50 MCG PO TABS
100.0000 ug | ORAL_TABLET | Freq: Every day | ORAL | Status: DC
  Administered 2022-10-19 – 2022-10-22 (×4): 100 ug via ORAL
  Filled 2022-10-18 (×4): qty 2

## 2022-10-18 MED ORDER — ONDANSETRON HCL 4 MG/2ML IJ SOLN: 4.0000 mg | Freq: Four times a day (QID) | INTRAMUSCULAR | Status: DC | PRN

## 2022-10-18 MED ORDER — OLANZAPINE 10 MG PO TABS
10.0000 mg | ORAL_TABLET | Freq: Every day | ORAL | Status: DC
  Administered 2022-10-18 – 2022-10-21 (×4): 10 mg via ORAL
  Filled 2022-10-18 (×5): qty 1

## 2022-10-18 MED ORDER — ACETAMINOPHEN 325 MG PO TABS: 650.0000 mg | ORAL_TABLET | Freq: Four times a day (QID) | ORAL | Status: DC | PRN

## 2022-10-18 MED ORDER — POLYSACCHARIDE IRON COMPLEX 150 MG PO CAPS
150.0000 mg | ORAL_CAPSULE | Freq: Every day | ORAL | Status: DC
  Administered 2022-10-18 – 2022-10-22 (×5): 150 mg via ORAL
  Filled 2022-10-18 (×6): qty 1

## 2022-10-18 MED ORDER — CARBAMAZEPINE 200 MG PO TABS
200.0000 mg | ORAL_TABLET | Freq: Three times a day (TID) | ORAL | Status: DC
  Administered 2022-10-18 – 2022-10-22 (×11): 200 mg via ORAL
  Filled 2022-10-18 (×10): qty 1

## 2022-10-18 MED ORDER — SODIUM CHLORIDE 0.9 % IV SOLN
3.0000 g | Freq: Four times a day (QID) | INTRAVENOUS | Status: DC
  Administered 2022-10-18 – 2022-10-21 (×11): 3 g via INTRAVENOUS
  Filled 2022-10-18 (×12): qty 8

## 2022-10-18 MED ORDER — OLANZAPINE 5 MG PO TABS
5.0000 mg | ORAL_TABLET | Freq: Every day | ORAL | Status: DC
  Administered 2022-10-19 – 2022-10-22 (×4): 5 mg via ORAL
  Filled 2022-10-18 (×4): qty 1

## 2022-10-18 MED ORDER — PANTOPRAZOLE SODIUM 40 MG IV SOLR
40.0000 mg | Freq: Two times a day (BID) | INTRAVENOUS | Status: DC
  Administered 2022-10-18 – 2022-10-20 (×3): 40 mg via INTRAVENOUS
  Filled 2022-10-18 (×3): qty 10

## 2022-10-18 MED ORDER — BENZONATATE 100 MG PO CAPS
200.0000 mg | ORAL_CAPSULE | Freq: Three times a day (TID) | ORAL | Status: DC
  Administered 2022-10-18 – 2022-10-22 (×12): 200 mg via ORAL
  Filled 2022-10-18 (×12): qty 2

## 2022-10-18 NOTE — Assessment & Plan Note (Signed)
Speech therapy wants to do modified swallow evaluation tomorrow

## 2022-10-18 NOTE — ED Provider Notes (Signed)
[facility] Provider Note    Event Date/Time   First MD Initiated Contact with Patient 10/18/22 1137     (approximate)   History   Fall and Shortness of Breath   HPI  Michele James is a 67 y.o. female  here with SOB. Pt states that over the past several days she has had cough, rhinorrhea, and SOB. She has been fatigued, had poor appetite. She says she couldn't catch her breath today so she presents for evaluation. Has had fatigue, low energy. Denies any abd pain, n/v/d. No other compliants.       Physical Exam   Triage Vital Signs: ED Triage Vitals  Encounter Vitals Group     BP 10/18/22 1032 (!) 105/56     Systolic BP Percentile --      Diastolic BP Percentile --      Pulse Rate 10/18/22 1032 93     Resp 10/18/22 1032 (!) 24     Temp 10/18/22 1032 99.3 F (37.4 C)     Temp src --      SpO2 10/18/22 1032 99 %     Weight 10/18/22 1040 132 lb 4.4 oz (60 kg)     Height 10/18/22 1040 5\' 3"  (1.6 m)     Head Circumference --      Peak Flow --      Pain Score 10/18/22 1039 5     Pain Loc --      Pain Education --      Exclude from Growth Chart --     Most recent vital signs: Vitals:   10/18/22 1330 10/18/22 1400  BP: 130/63 112/62  Pulse:  83  Resp: (!) 26 (!) 23  Temp:  97.8 F (36.6 C)  SpO2:  93%     General: Awake, no distress.  CV:  Good peripheral perfusion. RRR. Resp:  Increased WOB, tachypnea, and rales. Abd:  No distention. No tenderness. No guarding or rebound. Other:  Trace bilateral LE edema.   ED Results / Procedures / Treatments   Labs (all labs ordered are listed, but only abnormal results are displayed) Labs Reviewed  COMPREHENSIVE METABOLIC PANEL - Abnormal; Notable for the following components:      Result Value   Sodium 131 (*)    Glucose, Bld 139 (*)    Creatinine, Ser 0.43 (*)    Calcium 8.1 (*)    Total Protein 6.3 (*)    Albumin 2.5 (*)    All other components within normal limits  LACTIC ACID,  PLASMA - Abnormal; Notable for the following components:   Lactic Acid, Venous 2.2 (*)    All other components within normal limits  CBC WITH DIFFERENTIAL/PLATELET - Abnormal; Notable for the following components:   WBC 12.3 (*)    RBC 2.74 (*)    Hemoglobin 8.1 (*)    HCT 25.6 (*)    Neutro Abs 9.0 (*)    Monocytes Absolute 2.0 (*)    Abs Immature Granulocytes 0.11 (*)    All other components within normal limits  TROPONIN I (HIGH SENSITIVITY) - Abnormal; Notable for the following components:   Troponin I (High Sensitivity) 34 (*)    All other components within normal limits  RESP PANEL BY RT-PCR (RSV, FLU A&B, COVID)  [id number]  CULTURE, BLOOD (ROUTINE X 2)  CULTURE, BLOOD (ROUTINE X 2)  LACTIC ACID, PLASMA  PROTIME-INR  PROCALCITONIN  URINALYSIS, ROUTINE W REFLEX MICROSCOPIC     EKG  Normal sinus rhythm, VR 86. PR 152, QRS 82, QTc 380. NO acute ST elevations or depressions. No ischemia or infarct.  RADIOLOGY CXR: No acute abnormality   I also independently reviewed and agree with radiologist interpretations.   PROCEDURES:  Critical Care performed: Yes, see critical care procedure note(s)  .Critical Care  Performed by: Shaune Pollack, MD Authorized by: Shaune Pollack, MD   Critical care provider statement:    Critical care time (minutes):  30   Critical care time was exclusive of:  Separately billable procedures and treating other patients   Critical care was necessary to treat or prevent imminent or life-threatening deterioration of the following conditions:  Cardiac failure, circulatory failure, respiratory failure and sepsis   Critical care was time spent personally by me on the following activities:  Development of treatment plan with patient or surrogate, discussions with consultants, evaluation of patient's response to treatment, examination of patient, ordering and review of laboratory studies, ordering and review of radiographic studies, ordering and  performing treatments and interventions, pulse oximetry, re-evaluation of patient's condition and review of old charts     MEDICATIONS ORDERED IN ED: Medications  cefTRIAXone (ROCEPHIN) 2 g in sodium chloride 0.9 % 100 mL IVPB (0 g Intravenous Stopped 10/18/22 1450)  azithromycin (ZITHROMAX) 500 mg in sodium chloride 0.9 % 250 mL IVPB (500 mg Intravenous New Bag/Given 10/18/22 1450)  sodium chloride 0.9 % bolus 1,000 mL (1,000 mLs Intravenous New Bag/Given 10/18/22 1222)    And  sodium chloride 0.9 % bolus 1,000 mL (1,000 mLs Intravenous New Bag/Given 10/18/22 1223)  iohexol (OMNIPAQUE) 350 MG/ML injection 75 mL (75 mLs Intravenous Contrast Given 10/18/22 1433)     IMPRESSION / MDM / ASSESSMENT AND PLAN / ED COURSE  I reviewed the triage vital signs and the nursing notes.                              Differential diagnosis includes, but is not limited to, PNA, PE, CHF, ACS, anemia  Patient's presentation is most consistent with acute presentation with potential threat to life or bodily function.  The patient is on the cardiac monitor to evaluate for evidence of arrhythmia and/or significant heart rate changes  67 yo F here with cough, SOB, hypoxia. Exam and history is c/f acute hypoxic resp failure 2/2 PNA, though PE, CHF a consideration. CT Angio obtained and per my review, is concerning for bilateral lower lobe patchy PNA. COVID is negative. LA initially elevated but normalized with IVF, and pt received IVF 30 cc/kg with broad-spectrum ABX. WIll admit to hospitalist service.    FINAL CLINICAL IMPRESSION(S) / ED DIAGNOSES   Final diagnoses:  Sepsis due to pneumonia [facility])     Rx / DC Orders   ED Discharge Orders     None        Note:  This document was prepared using Dragon voice recognition software and may include unintentional dictation errors.   Shaune Pollack, MD 10/18/22 [phone]

## 2022-10-18 NOTE — Assessment & Plan Note (Signed)
Continue Synthroid °

## 2022-10-18 NOTE — H&P (Signed)
History and Physical    Patient: Michele James VHQ:[id number] DOB: [birth year] DOA: 10/18/2022 DOS: the patient was seen and examined on 10/18/2022 PCP: Dorothey Baseman, MD  Patient coming from: Home  Chief Complaint:  Chief Complaint  Patient presents with   Fall   Shortness of Breath   HPI: Michele James is a 67 y.o. 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  [name] states that she has been experiencing a runny nose with congestion, productive cough for at least over 1 week now.  She cannot recall exactly when shortness of breath began.  She denies any chest pain or palpitations.  She denies any nausea, vomiting, diarrhea.  She notes she has chronically black stools that are intermittent with her last episode yesterday.  She believes her bowel movement today was brown.  She denies any dizziness.  She notes that earlier today, she was sitting on the commode and tried to get up but was unable to do so.  Her mother tried to help her then but they both fell down onto the floor and she hit her left knee.  She denies any loss of consciousness or other injuries.  ED course: On arrival to the ED, patient's blood pressure was 105/56 with heart rate of 93.  She was saturating at 99% on 2 L due to hypoxia as low as 87 on room air.  She was afebrile at 99.3.  Patient's blood pressure has fluctuated in the ED from as low as 95/55 to 158/80.  Initial workup notable for WBC of 12.3, hemoglobin of 8.1, sodium of 131, glucose 139, creatinine 0.43, albumin 2.5, GFR above 60.  Troponin 34.  Lactic acid 2.2 with improvement to 0.8.  Procalcitonin less than 0.1.   COVID-19, influenza and RSV PCR negative.  CTA was obtained that demonstrated patchy lung opacities in the right upper, right  lower, and left lower lobes concerning for multifocal pneumonia, but no evidence of PE.  Patient started on azithromycin and ceftriaxone.  TR[facility] contacted for admission.  Review of Systems: As mentioned in the history of present illness. All other systems reviewed and are negative.  Past Medical History:  Diagnosis Date   Anemia    Arthritis    knees   Cerebral palsy (HCC)    Depression    Diabetes mellitus without complication (HCC)    Diverticulosis    Dysphagia    Eczema    GERD (gastroesophageal reflux disease)    Glaucoma    Hemorrhoids    Hyperlipidemia    Hypothyroidism    Seizures (HCC)    none in over 10 yrs   Past Surgical History:  Procedure Laterality Date   BREAST BIOPSY Right 03/22/2020   stereo bx, ribbon clip, path pending    CATARACT EXTRACTION W/PHACO Right 02/06/2016   Procedure: CATARACT EXTRACTION PHACO AND INTRAOCULAR LENS PLACEMENT (IOC);  Surgeon: Lockie Mola, MD;  Location: [facility];  Service: Ophthalmology;  Laterality: Right;   CATARACT EXTRACTION W/PHACO Left 03/05/2016   Procedure: CATARACT EXTRACTION PHACO AND INTRAOCULAR LENS PLACEMENT (IOC);  Surgeon: Lockie Mola, MD;  Location: [facility];  Service: Ophthalmology;  Laterality: Left;   CHOLECYSTECTOMY N/A 11/16/2015   Procedure: LAPAROSCOPIC CHOLECYSTECTOMY;  Surgeon: Tiney Rouge III, MD;  Location: [facility] ORS;  Service: General;  Laterality: N/A;  attempted cholangiogram  COLONOSCOPY WITH PROPOFOL N/A 12/25/2014   Procedure: COLONOSCOPY WITH PROPOFOL;  Surgeon: Wallace Cullens, MD;  Location: [facility];  Service: Gastroenterology;  Laterality: N/A;   COLONOSCOPY WITH PROPOFOL N/A 04/14/2019   Procedure: COLONOSCOPY WITH PROPOFOL;  Surgeon: Toledo, Boykin Nearing, MD;  Location: [facility] ENDOSCOPY;  Service: Gastroenterology;  Laterality: N/A;   COLONOSCOPY WITH PROPOFOL N/A 04/20/2022   Procedure: COLONOSCOPY WITH PROPOFOL;  Surgeon: Wyline Mood, MD;  Location: [facility] END[facility]COPY;   Service: Gastroenterology;  Laterality: N/A;   ENDOSCOPIC RETROGRADE CHOLANGIOPANCREATOGRAPHY (ERCP) WITH PROPOFOL N/A 11/18/2015   Procedure: ENDOSCOPIC RETROGRADE CHOLANGIOPANCREATOGRAPHY (ERCP) WITH PROPOFOL;  Surgeon: Midge Minium, MD;  Location: [facility] ENDOSCOPY;  Service: Endoscopy;  Laterality: N/A;   ESOPHAGOGASTRODUODENOSCOPY (EGD) WITH PROPOFOL N/A 04/14/2019   Procedure: ESOPHAGOGASTRODUODENOSCOPY (EGD) WITH PROPOFOL;  Surgeon: Toledo, Boykin Nearing, MD;  Location: [facility] ENDOSCOPY;  Service: Gastroenterology;  Laterality: N/A;   ESOPHAGOGASTRODUODENOSCOPY (EGD) WITH PROPOFOL N/A 04/20/2022   Procedure: ESOPHAGOGASTRODUODENOSCOPY (EGD) WITH PROPOFOL;  Surgeon: Wyline Mood, MD;  Location: [facility] ENDOSCOPY;  Service: Gastroenterology;  Laterality: N/A;   EYE SURGERY     Cataract Surgery    HERNIA REPAIR     Social History:  reports that she quit smoking about 42 years ago. Her smoking use included cigarettes. She has never used smokeless tobacco. She reports that she does not drink alcohol and does not use drugs.  Allergies  Allergen Reactions   Aminophylline Other (See Comments)    Headache   Cinnamon Other (See Comments)    Stomachache    Advil [Ibuprofen] Itching and Rash    Pt states she can take ibuprofen   Amoxicillin Rash   Latex Rash   Nsaids Rash   Penicillins Nausea And Vomiting   Potassium Nausea And Vomiting and Nausea Only   Potassium-Containing Compounds Nausea And Vomiting   Septra [Sulfamethoxazole-Trimethoprim] Rash   Sulfa Antibiotics Rash   Sulfasalazine Rash   Tape Rash    Paper tape ok   Theophyllines Other (See Comments)    Headache     Family History  Problem Relation Age of Onset   High blood pressure Mother    Hyperlipidemia Mother    Diabetes Mellitus II Mother    Stroke Mother    Osteoporosis Mother    High blood pressure Father    Breast cancer Maternal Aunt    Stroke Maternal Grandfather    Diabetes Mellitus II Maternal Grandfather    Stroke  Maternal Grandmother    Diabetes Mellitus II Maternal Grandmother     Prior to Admission medications   Medication Sig Start Date End Date Taking? Authorizing Provider  acetaminophen (TYLENOL) 325 MG tablet Take 325 mg by mouth every 6 (six) hours as needed.    [provider]  alendronate (FOSAMAX) 70 MG tablet Take 70 mg by mouth once a week. Take with a full glass of water on an empty stomach.    [provider]  ALPRAZolam Prudy Feeler) 0.25 MG tablet Take 0.25 mg by mouth as needed for anxiety. Before pap smears    [provider]  atorvastatin (LIPITOR) 40 MG tablet Take 40 mg by mouth daily.    [provider]  calcium citrate-vitamin D (CITRACAL+D) 315-200 MG-UNIT per tablet Take 1 tablet by mouth 2 (two) times daily.    [provider]  carbamazepine (TEGRETOL) 200 MG tablet Take 200 mg by mouth 3 (three) times daily.    [provider]  Cholecalciferol 100 MCG (4000 UT) TABS Take 1,000 Units by mouth.  [provider]  esomeprazole (NEXIUM) 40 MG capsule Take 40 mg by mouth daily at 12 noon.    [provider]  famotidine (PEPCID) 20 MG tablet Take 1 tablet by mouth 2 (two) times daily. 04/23/20   [provider]  hydroxypropyl methylcellulose / hypromellose (ISOPTO TEARS / GONIOVISC) 2.5 % ophthalmic solution 1 drop as needed for dry eyes.    [provider]  iron polysaccharides (NIFEREX) 150 MG capsule Take 1 capsule (150 mg total) by mouth 2 (two) times daily for 30 days, THEN 1 capsule (150 mg total) daily. 04/21/22 08/19/22  Gillis Santa, MD  levothyroxine (SYNTHROID) 112 MCG tablet Take 112 mcg by mouth every morning. 03/04/21   [provider]  magnesium oxide (MAG-OX) 400 MG tablet Take 1 tablet by mouth 3 (three) times daily. 01/13/22   [provider]  naproxen (NAPROSYN) 375 MG tablet Take 375 mg by mouth 2 (two) times daily with a meal.    [provider]  OLANZapine  (ZYPREXA) 10 MG tablet Take 1 tablet (10 mg total) by mouth at bedtime. 10/15/22 01/13/23  Neysa Hotter, MD  OLANZapine (ZYPREXA) 5 MG tablet Take 1 tablet (5 mg total) by mouth daily. 09/08/22 12/07/22  Neysa Hotter, MD  PARoxetine (PAXIL) 20 MG tablet Take 1 tablet (20 mg total) by mouth daily. 05/14/22 11/10/22  Neysa Hotter, MD  potassium chloride 20 MEQ/15ML (10%) SOLN Take 10 mEq by mouth daily.    [provider]  vitamin E 400 UNIT capsule Take 400 Units by mouth daily.    [provider]    Physical Exam: Vitals:   10/18/22 1230 10/18/22 1300 10/18/22 1330 10/18/22 1400  BP: 119/68 (!) 158/80 130/63 112/62  Pulse: 85   83  Resp: (!) 34 (!) 33 (!) 26 (!) 23  Temp:    97.8 F (36.6 C)  TempSrc:    Oral  SpO2: 100%   93%  Weight:      Height:       Physical Exam Vitals and nursing note reviewed.  Constitutional:      General: She is not in acute distress. HENT:     Head: Normocephalic and atraumatic.  Eyes:     Extraocular Movements: Extraocular movements intact.     Pupils: Pupils are equal, round, and reactive to light.  Cardiovascular:     Rate and Rhythm: Normal rate and regular rhythm.     Heart sounds: No murmur heard. Pulmonary:     Effort: Pulmonary effort is normal. Tachypnea present. No accessory muscle usage or respiratory distress.     Breath sounds: Rhonchi present. No wheezing or rales.  Abdominal:     General: Bowel sounds are normal.     Palpations: Abdomen is soft.     Tenderness: There is no abdominal tenderness. There is no guarding.  Musculoskeletal:     Right lower leg: No tenderness. No edema.     Left lower leg: No tenderness. No edema.     Comments: No knee pain on palpation, left  Skin:    General: Skin is warm and dry.     Coloration: Skin is pale.  Neurological:     Mental Status: She is alert.     Comments:  Patient is alert and oriented, appears to be at her baseline.  Left upper arm and bilateral lower extremity  contractures noted  Psychiatric:        Mood and Affect: Mood normal.  Behavior: Behavior normal.    Data Reviewed: CBC with WBC of 12.3, hemoglobin of 8.1, MCV of 93, platelets of 394 CMP with sodium of 131, potassium 3.9, bicarb 26, glucose 139, BUN 18, creatinine 0.43, albumin 2.5, AST 19, ALT 18 and GFR above 60 Troponin 34 Lactic acid 2.2 with improvement to 0.8 Procalcitonin less than 0.10 COVID-19, influenza and RSV PCR negative INR 1.1  EKG personally reviewed.  Sinus rhythm with rate of 86.  No ST or T wave changes concerning for acute ischemia.  Nonspecific ST inversions noted though  CT Angio Chest PE W and/or Wo Contrast  Result Date: 10/18/2022 CLINICAL DATA:  Pulmonary embolism suspected. Fall in the bathroom today. Ongoing shortness of breath. EXAM: CT ANGIOGRAPHY CHEST WITH CONTRAST TECHNIQUE: Multidetector CT imaging of the chest was performed using the standard protocol during bolus administration of intravenous contrast. Multiplanar CT image reconstructions and MIPs were obtained to evaluate the vascular anatomy. RADIATION DOSE REDUCTION: This exam was performed according to the departmental dose-optimization program which includes automated exposure control, adjustment of the mA and/or kV according to patient size and/or use of iterative reconstruction technique. CONTRAST:  75mL OMNIPAQUE IOHEXOL 350 MG/ML SOLN COMPARISON:  None Available. FINDINGS: Cardiovascular: Satisfactory opacification of the pulmonary arteries to the segmental level. No evidence of pulmonary embolism. Normal heart size. No pericardial effusion. Mediastinum/Nodes: No enlarged mediastinal, hilar, or axillary lymph nodes. Thyroid gland, trachea, and esophagus demonstrate no significant findings. Lungs/Pleura: Centrilobular emphysematous changes. Patchy lung opacities in the dependent portions of the right upper and right lower lobe. Patchy lung opacities in the left lower lobe with atelectasis. No  pleural effusion. Upper Abdomen: Moderate size hiatal hernia. 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 Review of the MIP images confirms the above findings. IMPRESSION: 1. No evidence of pulmonary embolism. 2. Patchy lung opacities in the dependent portions of the right upper and right lower lobe and left lower lobe with atelectasis. The findings are consistent with multifocal pneumonia involving bilateral lungs predominantly lower lobes, follow-up examination to resolution is recommended. 3. Moderate size hiatal hernia. 4. 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 5. Emphysema. Emphysema (ICD10-J43.9). Electronically Signed   By: Larose Hires D.O.   On: 10/18/2022 15:01   DG Chest 2 View  Result Date: 10/18/2022 CLINICAL DATA:  Suspected sepsis EXAM: CHEST - 2 VIEW COMPARISON:  05/16/2020 FINDINGS: Retrocardiac opacity that is a hiatal hernia by April 2024 chest CT. Low volume chest. There is no edema, consolidation, effusion, or pneumothorax. Hazy obscuration of the lateral left diaphragm attributed to mediastinal fat by prior CT. Remote left rib fractures. IMPRESSION: 1. No acute finding. 2. Moderate hiatal hernia. Electronically Signed   By: Tiburcio Pea M.D.   On: 10/18/2022 12:10    Results are pending, will review when available.  Assessment and Plan:  * CAP (community acquired pneumonia) Patient is presenting with cough, congestion, tachypnea and shortness of breath with symptoms beginning over 1 week ago.  Multifocal pneumonia seen on CTA.  Given these findings and negative procalcitonin, most likely viral in etiology.  RSV, influenza and COVID-19 PCR negative.  Given chronic dysphagia and possible aspiration event today, will cover for aspiration pneumonia.  No history of COPD.  - Continue supplemental oxygen to maintain oxygen saturation above 88% - Wean as tolerated - Respiratory viral panel pending -  Supportive management with Mucinex, Tessalon Perles, IV fluids - Unasyn per pharmacy dosing  Melena Patient reports chronic melena that is intermittent.  Last episode was yesterday, with normal bowel movement today.  Hospital admission in January 2024 for similar symptoms at which time EGD and colonoscopy was negative.  Patient is currently on PPI, once daily  - Start IV Protonix twice daily with transition to p.o. on discharge - Management of anemia as noted below  Iron deficiency anemia due to chronic blood loss Patient has a history of iron deficiency anemia in the setting of chronic GI blood loss, with hospitalization in January 2024 at which time EGD and colonoscopy did not identify source of bleeding.  She received IV iron at that time.  Hemoglobin today is lower than measurement in April 2024.  - Iron panel - Daily CBC - Transfuse for hemoglobin less than 7  Schizophreniform disorder (HCC) - Continue home Zyprexa, Paxil  Essential (primary) hypertension History of hypertension listed in chart, however patient is not currently on any antihypertensives.  Blood pressure is on the lower end of normal at this time.  Will continue to monitor while admitted.  Type 2 diabetes mellitus without complication, without long-term current use of insulin (HCC) History of type 2 diabetes, currently diet controlled.  Last A1c of 5.3% approximately 2 weeks ago.  No indication for SSI at this time.  Seizure [facility]) Per chart review, no seizures in over 10 years.  - Continue home Tegretol  Hypothyroidism (acquired) - Continue home Synthroid  Cerebral palsy (HCC) Patient has a history of cerebral palsy with multiple contractures that make mobility difficult.  She did have a fall today resulting in left knee pain but no other injuries.  - PT/OT  Advance Care Planning:   Code Status: Full Code   Consults: None  Family Communication: No family at bedside  Severity of Illness: The appropriate  patient status for this patient is OBSERVATION. Observation status is judged to be reasonable and necessary in order to provide the required intensity of service to ensure the patient's safety. The patient's presenting symptoms, physical exam findings, and initial radiographic and laboratory data in the context of their medical condition is felt to place them at decreased risk for further clinical deterioration. Furthermore, it is anticipated that the patient will be medically stable for discharge from the hospital within 2 midnights of admission.   Author: Verdene Lennert, MD 10/18/2022 4:04 PM  For on call review [email or web address].

## 2022-10-18 NOTE — Assessment & Plan Note (Deleted)
History of hypertension listed in chart, however patient is not currently on any antihypertensives.  Blood pressure is on the lower end of normal at this time.  Will continue to monitor while admitted.

## 2022-10-18 NOTE — Assessment & Plan Note (Addendum)
Switched Unasyn to cefuroxime with diarrhea.  Continue Zithromax.  Sepsis ruled out.

## 2022-10-18 NOTE — Assessment & Plan Note (Signed)
Patient has a history of iron deficiency anemia in the setting of chronic GI blood loss, with hospitalization in January 2024 at which time EGD and colonoscopy did not identify source of bleeding.  She received IV iron at that time.  Hemoglobin today is lower than measurement in April 2024.  - Iron panel - Daily CBC - Transfuse for hemoglobin less than 7

## 2022-10-18 NOTE — Consult Note (Signed)
Pharmacy Antibiotic Note  Michele James is a 67 y.o. female admitted on 10/18/2022 with pneumonia. PMH significant for cerebral palsy, hypothyroidism, seizure, T2DM, HTN, schizophrenia. Patient presenting with concern for pneumonia in the setting of chronic dysphasia. Pharmacy has been consulted for Unasyn dosing.  Plan: Day 1 of antibiotics Start Unasyn 3 g IV Q6H Continue to monitor renal function and follow culture results   Height: 5\' 3"  (160 cm) Weight: 60 kg (132 lb 4.4 oz) IBW/kg (Calculated) : 52.4  Temp (24hrs), Avg:98.9 F (37.2 C), Min:97.8 F (36.6 C), Max:99.6 F (37.6 C)  Recent Labs  Lab 10/18/22 1048 10/18/22 1344  WBC 12.3*  --   CREATININE 0.43*  --   LATICACIDVEN 2.2* 0.8    Estimated Creatinine Clearance: 58 mL/min (A) (by C-G formula based on SCr of 0.43 mg/dL (L)).    Allergies  Allergen Reactions   Aminophylline Other (See Comments)    Headache   Cinnamon Other (See Comments)    Stomachache    Advil [Ibuprofen] Itching and Rash    Pt states she can take ibuprofen   Amoxicillin Rash   Latex Rash   Nsaids Rash   Penicillins Nausea And Vomiting   Potassium Nausea And Vomiting and Nausea Only   Potassium-Containing Compounds Nausea And Vomiting   Septra [Sulfamethoxazole-Trimethoprim] Rash   Sulfa Antibiotics Rash   Sulfasalazine Rash   Tape Rash    Paper tape ok   Theophyllines Other (See Comments)    Headache     Antimicrobials this admission: 7/20 Azithromycin x1 7/20 Ceftriaxone x1 7/20 Unasyn >>   Dose adjustments this admission: N/A  Microbiology results: 7/20 BCx: IP  Thank you for allowing pharmacy to be a part of this patient's care.  Celene Squibb, PharmD Clinical Pharmacist 10/18/2022 4:44 PM

## 2022-10-18 NOTE — ED Notes (Addendum)
FIRST NURSE: Pt to ER via EMS from home with reports of cough and congestion for over a week.  COVID negative 1 week ago.  Seen at PCP and told to continue OTC medications.  Today with SHOB, tachypnea.  RA sats 88%, placed on 2L [place] sats increased to 94-95%

## 2022-10-18 NOTE — Assessment & Plan Note (Addendum)
Per chart review, no seizures in over 10 years.  - Continue home Tegretol

## 2022-10-18 NOTE — ED Notes (Signed)
Pt cleaned of incontinence. Incontinence pad and brief changed.

## 2022-10-18 NOTE — Assessment & Plan Note (Signed)
Patient has a history of cerebral palsy with multiple contractures that make mobility difficult.  She did have a fall today resulting in left knee pain but no other injuries.  - PT/OT

## 2022-10-18 NOTE — Assessment & Plan Note (Addendum)
Hemoglobin on the lower side but stable.  Continue PPI.  Can have black stools with iron supplementation.

## 2022-10-18 NOTE — ED Triage Notes (Signed)
Pt in via [facility] from home; reports falling in the bathroom today and family was unable to help her up.  Patient also complains of ongoing SOB x approximately 1 week.  Productive cough, tachypnea noted in triage.  Only complaint from fall is left knee pain.    Patient arrives on 2L nasal cannula per EMS; reports decreased saturation on scene.

## 2022-10-18 NOTE — Progress Notes (Signed)
eLINK following for sepsis protocol. ?

## 2022-10-18 NOTE — Assessment & Plan Note (Signed)
Continue home Zyprexa, Paxil

## 2022-10-18 NOTE — Assessment & Plan Note (Signed)
History of type 2 diabetes, currently diet controlled.  Last A1c of 5.3% approximately 2 weeks ago.  No indication for SSI at this time.

## 2022-10-18 NOTE — Progress Notes (Signed)
CODE SEPSIS - PHARMACY COMMUNICATION  **Broad Spectrum Antibiotics should be administered within 1 hour of Sepsis diagnosis**  Time Code Sepsis Called/Page Received: 1208  Antibiotics Ordered: Ceftriaxone and Azithromycin   Time of 1st antibiotic administration: 1223  Additional action taken by pharmacy: N/A  If necessary, Name of Provider/Nurse Contacted: N/A   Gardner Candle, PharmD, BCPS Clinical Pharmacist 10/18/2022 12:11 PM

## 2022-10-19 DIAGNOSIS — K921 Melena: Secondary | ICD-10-CM | POA: Diagnosis present

## 2022-10-19 DIAGNOSIS — R0609 Other forms of dyspnea: Secondary | ICD-10-CM | POA: Diagnosis not present

## 2022-10-19 DIAGNOSIS — G40909 Epilepsy, unspecified, not intractable, without status epilepticus: Secondary | ICD-10-CM | POA: Diagnosis present

## 2022-10-19 DIAGNOSIS — I10 Essential (primary) hypertension: Secondary | ICD-10-CM | POA: Diagnosis present

## 2022-10-19 DIAGNOSIS — R197 Diarrhea, unspecified: Secondary | ICD-10-CM | POA: Diagnosis present

## 2022-10-19 DIAGNOSIS — D500 Iron deficiency anemia secondary to blood loss (chronic): Secondary | ICD-10-CM

## 2022-10-19 DIAGNOSIS — R569 Unspecified convulsions: Secondary | ICD-10-CM

## 2022-10-19 DIAGNOSIS — F2081 Schizophreniform disorder: Secondary | ICD-10-CM

## 2022-10-19 DIAGNOSIS — W19XXXA Unspecified fall, initial encounter: Secondary | ICD-10-CM | POA: Diagnosis present

## 2022-10-19 DIAGNOSIS — G809 Cerebral palsy, unspecified: Secondary | ICD-10-CM | POA: Diagnosis present

## 2022-10-19 DIAGNOSIS — G808 Other cerebral palsy: Secondary | ICD-10-CM

## 2022-10-19 DIAGNOSIS — J9601 Acute respiratory failure with hypoxia: Secondary | ICD-10-CM | POA: Diagnosis present

## 2022-10-19 DIAGNOSIS — R1310 Dysphagia, unspecified: Secondary | ICD-10-CM | POA: Diagnosis present

## 2022-10-19 DIAGNOSIS — E039 Hypothyroidism, unspecified: Secondary | ICD-10-CM | POA: Diagnosis present

## 2022-10-19 DIAGNOSIS — Z91018 Allergy to other foods: Secondary | ICD-10-CM | POA: Diagnosis not present

## 2022-10-19 DIAGNOSIS — E785 Hyperlipidemia, unspecified: Secondary | ICD-10-CM | POA: Diagnosis present

## 2022-10-19 DIAGNOSIS — E872 Acidosis, unspecified: Secondary | ICD-10-CM

## 2022-10-19 DIAGNOSIS — Z9049 Acquired absence of other specified parts of digestive tract: Secondary | ICD-10-CM | POA: Diagnosis not present

## 2022-10-19 DIAGNOSIS — E119 Type 2 diabetes mellitus without complications: Secondary | ICD-10-CM | POA: Diagnosis present

## 2022-10-19 DIAGNOSIS — Z9841 Cataract extraction status, right eye: Secondary | ICD-10-CM | POA: Diagnosis not present

## 2022-10-19 DIAGNOSIS — Z888 Allergy status to other drugs, medicaments and biological substances status: Secondary | ICD-10-CM | POA: Diagnosis not present

## 2022-10-19 DIAGNOSIS — Z1152 Encounter for screening for COVID-19: Secondary | ICD-10-CM | POA: Diagnosis not present

## 2022-10-19 DIAGNOSIS — Z880 Allergy status to penicillin: Secondary | ICD-10-CM | POA: Diagnosis not present

## 2022-10-19 DIAGNOSIS — J189 Pneumonia, unspecified organism: Secondary | ICD-10-CM | POA: Diagnosis present

## 2022-10-19 DIAGNOSIS — Z87891 Personal history of nicotine dependence: Secondary | ICD-10-CM | POA: Diagnosis not present

## 2022-10-19 DIAGNOSIS — Z91040 Latex allergy status: Secondary | ICD-10-CM | POA: Diagnosis not present

## 2022-10-19 DIAGNOSIS — Z79890 Hormone replacement therapy (postmenopausal): Secondary | ICD-10-CM | POA: Diagnosis not present

## 2022-10-19 DIAGNOSIS — Z961 Presence of intraocular lens: Secondary | ICD-10-CM | POA: Diagnosis present

## 2022-10-19 LAB — CBC WITH DIFFERENTIAL/PLATELET
Abs Immature Granulocytes: 0.19 10*3/uL — ABNORMAL HIGH (ref 0.00–0.07)
Basophils Absolute: 0.1 10*3/uL (ref 0.0–0.1)
Basophils Relative: 1 %
Eosinophils Absolute: 0.6 10*3/uL — ABNORMAL HIGH (ref 0.0–0.5)
Eosinophils Relative: 5 %
HCT: 26.8 % — ABNORMAL LOW (ref 36.0–46.0)
Hemoglobin: 8.1 g/dL — ABNORMAL LOW (ref 12.0–15.0)
Immature Granulocytes: 2 %
Lymphocytes Relative: 12 %
Lymphs Abs: 1.3 10*3/uL (ref 0.7–4.0)
MCH: 28.6 pg (ref 26.0–34.0)
MCHC: 30.2 g/dL (ref 30.0–36.0)
MCV: 94.7 fL (ref 80.0–100.0)
Monocytes Absolute: 1.7 10*3/uL — ABNORMAL HIGH (ref 0.1–1.0)
Monocytes Relative: 16 %
Neutro Abs: 7.2 10*3/uL (ref 1.7–7.7)
Neutrophils Relative %: 64 %
Platelets: 410 10*3/uL — ABNORMAL HIGH (ref 150–400)
RBC: 2.83 MIL/uL — ABNORMAL LOW (ref 3.87–5.11)
RDW: 13.2 % (ref 11.5–15.5)
WBC: 11 10*3/uL — ABNORMAL HIGH (ref 4.0–10.5)
nRBC: 0 % (ref 0.0–0.2)

## 2022-10-19 LAB — BASIC METABOLIC PANEL
Anion gap: 6 (ref 5–15)
BUN: 11 mg/dL (ref 8–23)
CO2: 26 mmol/L (ref 22–32)
Calcium: 8.1 mg/dL — ABNORMAL LOW (ref 8.9–10.3)
Chloride: 105 mmol/L (ref 98–111)
Creatinine, Ser: 0.39 mg/dL — ABNORMAL LOW (ref 0.44–1.00)
GFR, Estimated: >60 mL/min (ref >60)
Glucose, Bld: 133 mg/dL — ABNORMAL HIGH (ref 70–99)
Potassium: 3.8 mmol/L (ref 3.5–5.1)
Sodium: 137 mmol/L (ref 135–145)

## 2022-10-19 LAB — HIV ANTIBODY (ROUTINE TESTING W REFLEX): HIV Screen 4th Generation wRfx: NONREACTIVE

## 2022-10-19 MED ORDER — AZITHROMYCIN 500 MG PO TABS
250.0000 mg | ORAL_TABLET | ORAL | Status: AC
  Administered 2022-10-19 – 2022-10-22 (×4): 250 mg via ORAL
  Filled 2022-10-19 (×4): qty 1

## 2022-10-19 MED ORDER — ACETAMINOPHEN 160 MG/5ML PO SOLN: 325.0000 mg | Freq: Four times a day (QID) | ORAL | Status: DC | PRN

## 2022-10-19 MED ORDER — SODIUM CHLORIDE 0.9 % IV SOLN
300.0000 mg | Freq: Once | INTRAVENOUS | Status: AC
  Administered 2022-10-19: 300 mg via INTRAVENOUS
  Filled 2022-10-19: qty 100

## 2022-10-19 MED ORDER — ACETAMINOPHEN 325 MG PO TABS
325.0000 mg | ORAL_TABLET | Freq: Four times a day (QID) | ORAL | Status: DC | PRN
  Administered 2022-10-21: 325 mg via ORAL
  Filled 2022-10-19: qty 1

## 2022-10-19 NOTE — Assessment & Plan Note (Signed)
Improved

## 2022-10-19 NOTE — Evaluation (Signed)
Clinical/Bedside Swallow Evaluation Patient Details  Name: Michele James MRN: [id number] Date of Birth: 1955-04-20  Today's Date: 10/19/2022 Time: SLP Start Time (ACUTE ONLY): 0940 SLP Stop Time (ACUTE ONLY): 0955 SLP Time Calculation (min) (ACUTE ONLY): 15 min  Past Medical History:  Past Medical History:  Diagnosis Date   Anemia    Arthritis    knees   Cerebral palsy (HCC)    Depression    Diabetes mellitus without complication (HCC)    Diverticulosis    Dysphagia    Eczema    GERD (gastroesophageal reflux disease)    Glaucoma    Hemorrhoids    Hyperlipidemia    Hypothyroidism    Seizures (HCC)    none in over 10 yrs   Past Surgical History:  Past Surgical History:  Procedure Laterality Date   BREAST BIOPSY Right 03/22/2020   stereo bx, ribbon clip, path pending    CATARACT EXTRACTION W/PHACO Right 02/06/2016   Procedure: CATARACT EXTRACTION PHACO AND INTRAOCULAR LENS PLACEMENT (IOC);  Surgeon: Lockie Mola, MD;  Location: [facility];  Service: Ophthalmology;  Laterality: Right;   CATARACT EXTRACTION W/PHACO Left 03/05/2016   Procedure: CATARACT EXTRACTION PHACO AND INTRAOCULAR LENS PLACEMENT (IOC);  Surgeon: Lockie Mola, MD;  Location: [facility];  Service: Ophthalmology;  Laterality: Left;   CHOLECYSTECTOMY N/A 11/16/2015   Procedure: LAPAROSCOPIC CHOLECYSTECTOMY;  Surgeon: Tiney Rouge III, MD;  Location: [facility] ORS;  Service: General;  Laterality: N/A;  attempted cholangiogram   COLONOSCOPY WITH PROPOFOL N/A 12/25/2014   Procedure: COLONOSCOPY WITH PROPOFOL;  Surgeon: Wallace Cullens, MD;  Location: [facility] ENDOSCOPY;  Service: Gastroenterology;  Laterality: N/A;   COLONOSCOPY WITH PROPOFOL N/A 04/14/2019   Procedure: COLONOSCOPY WITH PROPOFOL;  Surgeon: Toledo, Boykin Nearing, MD;  Location: [facility] ENDOSCOPY;  Service: Gastroenterology;  Laterality: N/A;   COLONOSCOPY WITH PROPOFOL N/A 04/20/2022   Procedure: COLONOSCOPY WITH PROPOFOL;  Surgeon: Wyline Mood,  MD;  Location: [facility] ENDOSCOPY;  Service: Gastroenterology;  Laterality: N/A;   ENDOSCOPIC RETROGRADE CHOLANGIOPANCREATOGRAPHY (ERCP) WITH PROPOFOL N/A 11/18/2015   Procedure: ENDOSCOPIC RETROGRADE CHOLANGIOPANCREATOGRAPHY (ERCP) WITH PROPOFOL;  Surgeon: Midge Minium, MD;  Location: [facility] ENDOSCOPY;  Service: Endoscopy;  Laterality: N/A;   ESOPHAGOGASTRODUODENOSCOPY (EGD) WITH PROPOFOL N/A 04/14/2019   Procedure: ESOPHAGOGASTRODUODENOSCOPY (EGD) WITH PROPOFOL;  Surgeon: Toledo, Boykin Nearing, MD;  Location: [facility] ENDOSCOPY;  Service: Gastroenterology;  Laterality: N/A;   ESOPHAGOGASTRODUODENOSCOPY (EGD) WITH PROPOFOL N/A 04/20/2022   Procedure: ESOPHAGOGASTRODUODENOSCOPY (EGD) WITH PROPOFOL;  Surgeon: Wyline Mood, MD;  Location: [facility] ENDOSCOPY;  Service: Gastroenterology;  Laterality: N/A;   EYE SURGERY     Cataract Surgery    HERNIA REPAIR     HPI:  Per H&P "Michele James is a 67 y.o. 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     Michele James states that she has been experiencing a runny nose with congestion, productive cough for at least over 1 week now.  She cannot recall exactly when shortness of breath began.  She denies any chest pain or palpitations.  She denies any nausea, vomiting, diarrhea.  She notes she has chronically black stools that are intermittent with her last episode yesterday.  She believes her bowel movement today was brown.  She denies any dizziness.  She notes that earlier today, she was sitting on the commode and tried to get up but was unable  to do so.  Her mother tried to help her then but they both fell down onto the floor and she hit her left knee.  She denies any loss of consciousness or other injuries." Chest CT "1. No evidence of pulmonary embolism.  2. Patchy lung  opacities in the dependent portions of the right  upper and right lower lobe and left lower lobe with atelectasis. The  findings are consistent with multifocal pneumonia involving  bilateral lungs predominantly lower lobes, follow-up examination to  resolution is recommended.  3. Moderate size hiatal hernia.  4. 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  5. Emphysema. "    Assessment / Plan / Recommendation  Clinical Impression  Pt seen for clinical swallowing evaluation. Pt consuming breakfast upon SLP entrance to room. Pt with L arm contracture. Per chart review, pt has been consuming a puree diet and thin liquids during previous hospitalizations. Suspect pt's baseline diet; however, pt providing inconsistent answers to diagnostic interview questions, so unclear as to pt's PLOF. Trials limited to thin liquids and puree given suspected baseline diet. Oral phase was functional across trials. Pharyngeally, pt with delayed cough x1 with puree. Pt endorses frequent "choking" PTA. Pt not able to provide specific details however. Given pt's risk factors (hiatal hernia, GERD, CP, cerebrovascular disease) in setting of PNA, will proceed with MBSS tomorrow to further evaluate pt's oropharyngeal swallow function and screen pharyngoesophageal swallow function. Pt OK to continue a puree diet with thin liquids with safe swallowing strategies/aspiration precautions and reflux precautions as outlined below. Given hx of GERD and hiatal hernia, suspect esophageal component to pt's dysphagia. Further recommendations to follow pending completion of MBSS. SLP Visit Diagnosis: Dysphagia, unspecified (R13.10)    Aspiration Risk  Mild aspiration risk    Diet Recommendation Dysphagia 1 (Puree);Thin liquid    Medication Administration: Crushed with puree Supervision: Patient able to self feed (set up) Compensations: Slow rate;Small sips/bites (reflux precautions) Postural Changes: Seated  upright at 90 degrees (remain upright at least 60 minutes after intake)    Other  Recommendations Recommended Consults: Consider GI evaluation    Recommendations for follow up therapy are one component of a multi-disciplinary discharge planning process, led by the attending physician.  Recommendations may be updated based on patient status, additional functional criteria and insurance authorization.  Follow up Recommendations  (TBD)         Functional Status Assessment Patient has had a recent decline in their functional status and demonstrates the ability to make significant improvements in function in a reasonable and predictable amount of time.         Prognosis Prognosis for improved oropharyngeal function: Fair Barriers to Reach Goals:  (comorbidities; hx)      Swallow Study   General Date of Onset: 10/17/22 HPI: Per H&P "Michele James is a 67 y.o. 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     Ms. Dembeck states that she has been experiencing a runny nose with congestion, productive cough for at least over 1 week now.  She cannot recall exactly when shortness of breath began.  She denies any chest pain or palpitations.  She denies any nausea, vomiting, diarrhea.  She notes she has chronically black stools that are intermittent with her last episode yesterday.  She believes her bowel movement today was brown.  She denies any dizziness.  She notes that earlier today, she was sitting on the commode and tried to get up but was unable to do so.  Her mother tried to help her then but they both fell down onto the floor and she hit her left knee.  She denies any loss of consciousness or other injuries." Chest CT "1. No evidence of pulmonary embolism.  2. Patchy lung opacities in  the dependent portions of the right  upper and right lower lobe and left lower lobe with atelectasis. The  findings are consistent with multifocal pneumonia involving  bilateral lungs predominantly lower lobes, follow-up examination to  resolution is recommended.  3. Moderate size hiatal hernia.  4. 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  5. Emphysema. " Type of Study: Bedside Swallow Evaluation Previous Swallow Assessment: hx of EGD; no CSE or MBSS noted Diet Prior to this Study: Dysphagia 1 (pureed);Thin liquids (Level 0) Respiratory Status: Nasal cannula History of Recent Intubation: No Behavior/Cognition: Alert;Cooperative;Pleasant mood Oral Cavity Assessment: Within Functional Limits Oral Care Completed by SLP: Recent completion by staff Oral Cavity - Dentition: Adequate natural dentition (some chipped teeth) Self-Feeding Abilities: Able to feed self;Needs set up Patient Positioning: Upright in bed Baseline Vocal Quality: Normal Volitional Cough: Strong Volitional Swallow: Able to elicit    Oral/Motor/Sensory Function Overall Oral Motor/Sensory Function: Within functional limits   Ice Chips Ice chips: Not tested   Thin Liquid Thin Liquid: Within functional limits Presentation: Cup;Straw;Self Fed    Nectar Thick Nectar Thick Liquid: Not tested   Honey Thick Honey Thick Liquid: Not tested   Puree Puree: Within functional limits Presentation: Self Fed Other Comments: delayed cough x1   Solid     Solid: Not tested     Clyde Canterbury, M.S., CCC-SLP Speech-Language Pathologist [facility] [phone] (ASCOM)  Alessandra Bevels Caio Devera 10/19/2022,10:56 AM

## 2022-10-19 NOTE — Assessment & Plan Note (Signed)
Pulse ox 88% in triage.  Patient qualifies for nocturnal oxygen with overnight oximetry test.  Continue oxygen at night.  Patient able to come off oxygen during the day.

## 2022-10-19 NOTE — Hospital Course (Signed)
67 y.o. 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   Ms. Pfefferkorn states that she has been experiencing a runny nose with congestion, productive cough for at least over 1 week now.  She cannot recall exactly when shortness of breath began.  She denies any chest pain or palpitations.  She denies any nausea, vomiting, diarrhea.  She notes she has chronically black stools that are intermittent with her last episode yesterday.  She believes her bowel movement today was brown  On arrival to the ED, patient's blood pressure was 105/56 with heart rate of 93.  She was saturating at 99% on 2 L due to hypoxia as low as 87 on room air.  She was afebrile at 99.3.  Patient's blood pressure has fluctuated in the ED from as low as 95/55 to 158/80.  Initial workup notable for WBC of 12.3, hemoglobin of 8.1, sodium of 131, glucose 139, creatinine 0.43, albumin 2.5, GFR above 60.  Troponin 34.  Lactic acid 2.2 with improvement to 0.8.  Procalcitonin less than 0.1.   COVID-19, influenza and RSV PCR negative.  CTA was obtained that demonstrated patchy lung opacities in the right upper, right lower, and left lower lobes concerning for multifocal pneumonia, but no evidence of PE.  Patient started on azithromycin and ceftriaxone.  TRH contacted for admission.   7/21.  Speech pathology wants to do a modified barium swallow tomorrow.  Patient still on 2 L of oxygen this morning.  Continue antibiotics.  PT and OT recommending a hospital bed and home health.

## 2022-10-19 NOTE — Progress Notes (Signed)
PT right forearm infiltrated, IV removed pharmacy contacted per pharmacist  apply warm compress. PT reports no pain or symptoms

## 2022-10-19 NOTE — Evaluation (Signed)
Occupational Therapy Evaluation Patient Details Name: Michele James MRN: [id number] DOB: Nov 05, 1955 Today's Date: 10/19/2022   History of Present Illness Michele James is a 67 y/o F with PMH including cerebral palsy, DM2, seizure, depression, aphasia. Admitted with CAP; had a fall at home while trying to tnransfer from toilet with mother assistance; pt hit L knee during fall.   Clinical Impression   Patient received for OT evaluation. See flowsheet below for details of function. Generally, patient requiring MAX A for bed mobility to EOB (MAX A x2 sit to supine), unable to perform scoot transfer even with MOD-MAX A, and set up-MAX A for ADLs. Patient will benefit from continued OT while in acute care.       Recommendations for follow up therapy are one component of a multi-disciplinary discharge planning process, led by the attending physician.  Recommendations may be updated based on patient status, additional functional criteria and insurance authorization.   Assistance Recommended at Discharge Intermittent Supervision/Assistance  Patient can return home with the following Two people to help with walking and/or transfers;A lot of help with bathing/dressing/bathroom;Assistance with cooking/housework;Direct supervision/assist for medications management;Direct supervision/assist for financial management;Assist for transportation;Help with stairs or ramp for entrance    Functional Status Assessment  Patient has had a recent decline in their functional status and demonstrates the ability to make significant improvements in function in a reasonable and predictable amount of time.  Equipment Recommendations  None recommended by OT    Recommendations for Other Services       Precautions / Restrictions Precautions Precautions: Fall Precaution Comments: watch O2 Restrictions Weight Bearing Restrictions: No      Mobility Bed Mobility Overal bed mobility: Needs Assistance Bed Mobility:  Supine to Sit, Sit to Supine     Supine to sit: Max assist Sit to supine: Max assist, +2 for physical assistance   General bed mobility comments: Pt using RUE on bedrail; needs significant assist    Transfers                   General transfer comment: Attempted to scoot at EOB today; unable to scoot with MOD A of OT using chucks. required +2 assist back into bed.      Balance Overall balance assessment: Needs assistance Sitting-balance support: Single extremity supported, Feet supported Sitting balance-Leahy Scale: Fair Sitting balance - Comments: Sat at EOB with distant supervision for grooming; sat up for approx 15 minutes.                                   ADL either performed or assessed with clinical judgement   ADL Overall ADL's : Needs assistance/impaired   Eating/Feeding Details (indicate cue type and reason): anticipate set up; pt able to use RUE functionally today Grooming: Set up;Sitting;Oral care;Brushing hair Grooming Details (indicate cue type and reason): seated EOB, pt brushed teeth with OT managing toothpaste due to pt holding toothbrush in R hand (LUE nonfunctional). combed hair while seated.   Upper Body Bathing Details (indicate cue type and reason): not tested; anticipate assistance as baseline   Lower Body Bathing Details (indicate cue type and reason): not tested; anticipate assistance as baseline   Upper Body Dressing Details (indicate cue type and reason): not tested; anticipate assistance as baseline Lower Body Dressing: Total assistance;Bed level Lower Body Dressing Details (indicate cue type and reason): OT dependently donned pt's socks   Toilet Transfer Details (indicate  cue type and reason): not attempted due to safety; pt unable to assist meaningfully in scooting at EOB; MAX A to scoot slightly at EOB.   Toileting - Clothing Manipulation Details (indicate cue type and reason): not tested; anticipate max a bed level at this  time   Tub/Shower Transfer Details (indicate cue type and reason): not tested; anticipate MAX-total A Functional mobility during ADLs:  (did not mobilize) General ADL Comments: Pt is able to complete seated grooming set up with RUE; anticipate MAX A for transfers and LB ADLs.     Vision         Perception     Praxis      Pertinent Vitals/Pain Pain Assessment Pain Assessment: 0-10 Pain Score:  (unrated; moderate) Pain Location: feet Pain Descriptors / Indicators: Aching Pain Intervention(s): Limited activity within patient's tolerance, Monitored during session     Hand Dominance  (Pt uses R hand 2/2 L hand being contracted.)   Extremity/Trunk Assessment Upper Extremity Assessment Upper Extremity Assessment: LUE deficits/detail (R shoulder AROM to approx 120 degrees flexion.) LUE Deficits / Details: contracture (baseline)   Lower Extremity Assessment Lower Extremity Assessment: Defer to PT evaluation   Cervical / Trunk Assessment Cervical / Trunk Assessment: Kyphotic   Communication Communication Communication: No difficulties   Cognition Arousal/Alertness: Awake/alert Behavior During Therapy: WFL for tasks assessed/performed Overall Cognitive Status: No family/caregiver present to determine baseline cognitive functioning                                 General Comments: Pt is A+Ox4, socially pleasant, agreeable to therapy. Pt has some difficulty providing details of home transfers/ADL performance, but overall is able to follow commands to best of her ability. Unknown cognitive baseline.     General Comments  Pt sounding congested. On 2L O2 (at baseline does not use O2) at when semi-reclined was 96% saturation; when seated EOB pulseox not reading well, but variable 88-94%.    Exercises     Shoulder Instructions      Home Living Family/patient expects to be discharged to:: Private residence Living Arrangements: Parent (mother and father) Available  Help at Discharge: Family;Personal care attendant (Pt states she has a Estate agent" who comes in 2x day in morning and evening to assist with ADLs) Type of Home: House Home Access: Ramped entrance     Home Layout: One level     Bathroom Shower/Tub: Walk-in shower (handicapped accessible shower)   Bathroom Toilet: Standard Bathroom Accessibility: Yes How Accessible: Accessible via wheelchair Home Equipment: Shower seat;Wheelchair - manual (axillary crutches (not using anymore))   Additional Comments: Pt provided home DME information; difficulty with providing exact informatino at times.      Prior Functioning/Environment Prior Level of Function : Needs assist             Mobility Comments: Pt states that she requires "a lot" of help with transfers (sounds as if she does scoot or squat pivot transfers at baseline). Uses manual w/c for mobility around the house; states that she is able to mobilize the w/c herself. ADLs Comments: assist for showering, dressing, toilet transfers at baseline. grooming and eating from w/c level. Pt states she enjoys watching gameshows on TV. Pt has LUE contracture at baseline.        OT Problem List: Decreased strength;Decreased activity tolerance;Impaired balance (sitting and/or standing);Impaired UE functional use      OT Treatment/Interventions: Self-care/ADL training;Therapeutic exercise;Patient/family education;Therapeutic  activities    OT Goals(Current goals can be found in the care plan section) Acute Rehab OT Goals Patient Stated Goal: Get better; go home OT Goal Formulation: With patient Time For Goal Achievement: 11/02/22 Potential to Achieve Goals: Good ADL Goals Pt Will Transfer to Toilet: with mod assist;regular height toilet;squat pivot transfer Pt Will Perform Toileting - Clothing Manipulation and hygiene: with min assist;sitting/lateral leans Pt Will Perform Tub/Shower Transfer: with mod assist;Squat pivot transfer;shower seat  OT  Frequency: Min 1X/week    Co-evaluation              AM-PAC OT "6 Clicks" Daily Activity     Outcome Measure Help from another person eating meals?: None Help from another person taking care of personal grooming?: A Little Help from another person toileting, which includes using toliet, bedpan, or urinal?: Total Help from another person bathing (including washing, rinsing, drying)?: A Lot Help from another person to put on and taking off regular upper body clothing?: A Little Help from another person to put on and taking off regular lower body clothing?: Total 6 Click Score: 14   End of Session Nurse Communication: Mobility status  Activity Tolerance: Patient limited by fatigue Patient left: in bed;with call bell/phone within reach;with bed alarm set  OT Visit Diagnosis: Muscle weakness (generalized) (M62.81)                Time: 1610-9604 OT Time Calculation (min): 29 min Charges:  OT General Charges $OT Visit: 1 Visit OT Evaluation $OT Eval Moderate Complexity: 1 Mod OT Treatments $Self Care/Home Management : 8-22 mins  Linward Foster, MS, OTR/L  Alvester Morin 10/19/2022, 9:47 AM

## 2022-10-19 NOTE — Progress Notes (Signed)
Progress Note   Patient: Michele James ZOX:[id number] DOB: 1955/08/30 DOA: 10/18/2022     0 DOS: the patient was seen and examined on 10/19/2022   Brief hospital course: 67 y.o. 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   [name] states that she has been experiencing a runny nose with congestion, productive cough for at least over 1 week now.  She cannot recall exactly when shortness of breath began.  She denies any chest pain or palpitations.  She denies any nausea, vomiting, diarrhea.  She notes she has chronically black stools that are intermittent with her last episode yesterday.  She believes her bowel movement today was brown  On arrival to the ED, patient's blood pressure was 105/56 with heart rate of 93.  She was saturating at 99% on 2 L due to hypoxia as low as 87 on room air.  She was afebrile at 99.3.  Patient's blood pressure has fluctuated in the ED from as low as 95/55 to 158/80.  Initial workup notable for WBC of 12.3, hemoglobin of 8.1, sodium of 131, glucose 139, creatinine 0.43, albumin 2.5, GFR above 60.  Troponin 34.  Lactic acid 2.2 with improvement to 0.8.  Procalcitonin less than 0.1.   COVID-19, influenza and RSV PCR negative.  CTA was obtained that demonstrated patchy lung opacities in the right upper, right lower, and left lower lobes concerning for multifocal pneumonia, but no evidence of PE.  Patient started on azithromycin and ceftriaxone.  TRH contacted for admission.   7/21.  Speech pathology wants to do a modified barium swallow tomorrow.  Patient still on 2 L of oxygen this morning.  Continue antibiotics.  PT and OT recommending a hospital bed and home health.  Assessment and Plan: * Acute respiratory failure with hypoxia (HCC) Pulse ox 88% in  triage.  On 2 L of oxygen.  See if we can taper off.  CAP (community acquired pneumonia) Patient on Unasyn.  I added Zithromax to cover atypicals.  Sepsis ruled out.  Lactic acidosis Improved  Melena Hemoglobin on the lower side.  Continue PPI.  Can have black stools with iron supplementation.  Will give IV iron.  Iron deficiency anemia due to chronic blood loss Last hemoglobin 8.1 with a ferritin of 25.  Will give iron  Dysphagia Speech therapy wants to do modified swallow evaluation tomorrow  Schizophreniform disorder (HCC) Continue home Zyprexa, Paxil  Type 2 diabetes mellitus without complication, without long-term current use of insulin (HCC) History of type 2 diabetes, currently diet controlled.  Last A1c of 5.3% approximately 2 weeks ago.  No indication for SSI at this time.  Seizure (HCC) Continue Tegretol  Hypothyroidism (acquired) Continue Synthroid  Cerebral palsy (HCC) Patient has a history of cerebral palsy with multiple contractures that make mobility difficult.  PT and OT recommending home health and a hospital bed.        Subjective: Patient coughing up some greenish phlegm.  Admitted with pneumonia.  Some shortness of breath.  On 2 L and she does not wear oxygen at home.  Physical Exam: Vitals:   10/18/22 1929 10/19/22 0526 10/19/22 1618 10/19/22 1619  BP: (!) 109/51 (!) 125/54 (!) 115/50 (!) 115/50  Pulse: 91 (!) 101 91 95  Resp: (!) 24 18 16 16   Temp: 98.4 F (36.9 C) 98.1 F (36.7  C) 98.3 F (36.8 C) 98.3 F (36.8 C)  TempSrc: Oral Oral Oral Oral  SpO2: 95% 93% 97% 97%  Weight:      Height:       Physical Exam HENT:     Head: Normocephalic.     Mouth/Throat:     Pharynx: No oropharyngeal exudate.  Eyes:     General: Lids are normal.     Conjunctiva/sclera: Conjunctivae normal.  Cardiovascular:     Rate and Rhythm: Normal rate and regular rhythm.     Heart sounds: Normal heart sounds, S1 normal and S2 normal.  Pulmonary:     Breath  sounds: Examination of the right-lower field reveals decreased breath sounds. Examination of the left-lower field reveals decreased breath sounds. Decreased breath sounds present. No wheezing, rhonchi or rales.  Abdominal:     Palpations: Abdomen is soft.     Tenderness: There is no abdominal tenderness.  Musculoskeletal:     Right lower leg: Swelling present.     Left lower leg: Swelling present.  Skin:    General: Skin is warm.     Findings: No rash.  Neurological:     Mental Status: She is alert.     Comments: Answers all questions appropriately.  Left arm contracted.  Barely able to straight leg raise either leg.     Data Reviewed: Creatinine 0.39, ferritin 25, hemoglobin 8.1, white blood cell count 11.0, platelet count 410  Family Communication: Spoke with father on the phone  Disposition: Status is: Inpatient Remains inpatient appropriate because: 28 minutes  Planned Discharge Destination: Home with home health    Time spent: 28 minutes  Author: Alford Highland, MD 10/19/2022 4:34 PM  For on call review [email or web address].

## 2022-10-19 NOTE — Evaluation (Signed)
Physical Therapy Evaluation Patient Details Name: Michele James MRN: [id number] DOB: June 26, 1955 Today's Date: 10/19/2022  History of Present Illness  Patient is a 67 year old female who presents with shortness of breath.She was sitting on commode and unable to get up, mother tried to help her and they both fell onto the floor and she hit her L knee.  PMH includes type II DM, CP with contractures limiting ability to ambulate, seizure disorder, schizophreniform disorder, osteoporosis, hypothyroidism, depression, GERD, arthritis,and HLD.   Clinical Impression  Patient is a pleasant 67 year old female who presents with generalized weakness and limited mobility. She does not have family present to assist with history. Prior to hospital admission, per patient, pt has an aide that comes twice a day and requires assistance with all transfers. She is wheelchair dependent and pushes with her R arm as she has baseline contractures of L arm and BLE.  Patient lives with her parents.  Patient requires max A for all transfers and bed mobility performed today including: rolling L and R, scooting in bed, transferring to EOB, transferring back to supine position. She is able to maintain seated position with UE support for prolonged periods with CGA. She is not able to don socks and/or gown requiring assistance with performance of max A. Scooting up in bed requires max x 2 assistance.  Pt would benefit from skilled PT to address noted impairments and functional limitations (see below for any additional details).         Assistance Recommended at Discharge Frequent or constant Supervision/Assistance  If plan is discharge home, recommend the following:  Can travel by private vehicle  Two people to help with walking and/or transfers;Two people to help with bathing/dressing/bathroom;Direct supervision/assist for medications management;Assistance with cooking/housework;Direct supervision/assist for financial  management;Assist for transportation;Help with stairs or ramp for entrance        Equipment Recommendations Other (comment);Hospital bed (patient may benefit from hospital bed and hoyer lift at home)  Recommendations for Other Services       Functional Status Assessment Patient has had a recent decline in their functional status and demonstrates the ability to make significant improvements in function in a reasonable and predictable amount of time.     Precautions / Restrictions Precautions Precautions: Fall Precaution Comments: watch O2 Restrictions Weight Bearing Restrictions: No      Mobility  Bed Mobility Overal bed mobility: Needs Assistance Bed Mobility: Supine to Sit, Sit to Supine Rolling: Max assist   Supine to sit: Max assist Sit to supine: Max assist   General bed mobility comments: patient helps with RUE but need significant assistance    Transfers Overall transfer level: Needs assistance                 General transfer comment: unable to scoot forward or backwards, or lateral    Ambulation/Gait               General Gait Details: patient is nonambulatory at baseline  Stairs            Wheelchair Mobility     Tilt Bed    Modified Rankin (Stroke Patients Only)       Balance Overall balance assessment: Needs assistance Sitting-balance support: Feet supported, Bilateral upper extremity supported Sitting balance-Leahy Scale: Fair Sitting balance - Comments: sat EOB with supervision, L contracture arm pressing into bed, R arm holding onto rail, prolonged sit for core stability and strength  Pertinent Vitals/Pain Pain Assessment Pain Assessment: No/denies pain    Home Living Family/patient expects to be discharged to:: Private residence Living Arrangements: Parent (mother and father) Available Help at Discharge: Family;Personal care attendant (Pt states she has a Estate agent"  who comes in 2x day in morning and evening to assist with ADLs) Type of Home: House Home Access: Ramped entrance       Home Layout: One level Home Equipment: Shower seat;Wheelchair - manual (axillary crutches (not using anymore)) Additional Comments: Pt provided home DME information;    Prior Function Prior Level of Function : Needs assist             Mobility Comments: Pt states that she requires "a lot" of help with transfers, has not stood in years per patient report. Uses manual w/c for mobility around the house; states that she is able to mobilize the w/c herself with her R arm. ADLs Comments: assist for showering, dressing, toilet transfers at baseline. grooming and eating from w/c level. Pt states she enjoys watching gameshows on TV. Pt has LUE and BLE contracture at baseline.     Hand Dominance   Dominant Hand:  (L hand contractures lead to R primary use)    Extremity/Trunk Assessment   Upper Extremity Assessment Upper Extremity Assessment: Defer to OT evaluation    Lower Extremity Assessment Lower Extremity Assessment: RLE deficits/detail;LLE deficits/detail RLE Deficits / Details: contractures of ankles and hips ; 2/5 RLE Sensation: decreased light touch RLE Coordination: decreased gross motor LLE Deficits / Details: contractures of ankles and hips; grossly 2/5 LLE Sensation: decreased light touch LLE Coordination: decreased gross motor    Cervical / Trunk Assessment Cervical / Trunk Assessment: Kyphotic  Communication   Communication: No difficulties  Cognition Arousal/Alertness: Awake/alert Behavior During Therapy: WFL for tasks assessed/performed Overall Cognitive Status: No family/caregiver present to determine baseline cognitive functioning                                 General Comments: Pt is A+Ox4, socially pleasant, agreeable to therapy. Follows commands but does repeat self frequently throughout session, intermittent confusion.         General Comments General comments (skin integrity, edema, etc.): Patient SP02 dropped to 88% on 2 L 02 during sitting, returned to 94% supine.    Exercises General Exercises - Lower Extremity Ankle Circles/Pumps: AROM, Strengthening, Both, 15 reps, Supine Heel Slides: Strengthening, Both, 10 reps, Supine Other Exercises Other Exercises: Patient educated on role of PT in acute care setting, safe mobility, assisted with doffing and donning new gown, positioning in bed, and seated posture.   Assessment/Plan    PT Assessment Patient needs continued PT services  PT Problem List Decreased strength;Decreased range of motion;Decreased activity tolerance;Decreased mobility;Decreased balance;Impaired sensation       PT Treatment Interventions DME instruction;Therapeutic exercise;Therapeutic activities;Functional mobility training;Neuromuscular re-education;Balance training;Patient/family education;Wheelchair mobility training;Manual techniques    PT Goals (Current goals can be found in the Care Plan section)  Acute Rehab PT Goals Patient Stated Goal: to return home PT Goal Formulation: With patient Time For Goal Achievement: 11/02/22 Potential to Achieve Goals: Fair    Frequency Min 2X/week     Co-evaluation               AM-PAC PT "6 Clicks" Mobility  Outcome Measure Help needed turning from your back to your side while in a flat bed without using bedrails?: A Lot  Help needed moving to and from a bed to a chair (including a wheelchair)?: A Lot Help needed standing up from a chair using your arms (e.g., wheelchair or bedside chair)?: Total Help needed to walk in hospital room?: Total Help needed climbing 3-5 steps with a railing? : Total 6 Click Score: 7    End of Session Equipment Utilized During Treatment: Gait belt;Oxygen (2 L via nasal cannula) Activity Tolerance: Patient tolerated treatment well;Patient limited by fatigue Patient left: in bed;with call bell/phone  within reach;with bed alarm set Nurse Communication: Mobility status PT Visit Diagnosis: Other abnormalities of gait and mobility (R26.89);Muscle weakness (generalized) (M62.81)    Time: 1610-9604 PT Time Calculation (min) (ACUTE ONLY): 38 min   Charges:   PT Evaluation $PT Eval Moderate Complexity: 1 Mod PT Treatments $Therapeutic Activity: 23-37 mins PT General Charges $$ ACUTE PT VISIT: 1 Visit         Precious Bard, DPT 10/19/2022, 1:35 PM

## 2022-10-20 ENCOUNTER — Inpatient Hospital Stay: Payer: Medicare Other

## 2022-10-20 DIAGNOSIS — E872 Acidosis, unspecified: Secondary | ICD-10-CM | POA: Diagnosis not present

## 2022-10-20 DIAGNOSIS — G809 Cerebral palsy, unspecified: Secondary | ICD-10-CM

## 2022-10-20 DIAGNOSIS — E119 Type 2 diabetes mellitus without complications: Secondary | ICD-10-CM

## 2022-10-20 DIAGNOSIS — K921 Melena: Secondary | ICD-10-CM | POA: Diagnosis not present

## 2022-10-20 DIAGNOSIS — R1319 Other dysphagia: Secondary | ICD-10-CM

## 2022-10-20 DIAGNOSIS — J9601 Acute respiratory failure with hypoxia: Secondary | ICD-10-CM | POA: Diagnosis not present

## 2022-10-20 DIAGNOSIS — J189 Pneumonia, unspecified organism: Secondary | ICD-10-CM | POA: Diagnosis not present

## 2022-10-20 LAB — CBC
HCT: 25.7 % — ABNORMAL LOW (ref 36.0–46.0)
Hemoglobin: 8 g/dL — ABNORMAL LOW (ref 12.0–15.0)
MCH: 29.4 pg (ref 26.0–34.0)
MCHC: 31.1 g/dL (ref 30.0–36.0)
MCV: 94.5 fL (ref 80.0–100.0)
Platelets: 389 10*3/uL (ref 150–400)
RBC: 2.72 MIL/uL — ABNORMAL LOW (ref 3.87–5.11)
RDW: 13.3 % (ref 11.5–15.5)
WBC: 10.4 10*3/uL (ref 4.0–10.5)
nRBC: 0.2 % (ref 0.0–0.2)

## 2022-10-20 LAB — CULTURE, BLOOD (ROUTINE X 2)

## 2022-10-20 MED ORDER — SODIUM CHLORIDE 0.9% FLUSH: 10.0000 mL | INTRAVENOUS | Status: DC | PRN

## 2022-10-20 MED ORDER — PANTOPRAZOLE SODIUM 40 MG IV SOLR
40.0000 mg | Freq: Two times a day (BID) | INTRAVENOUS | Status: DC
  Administered 2022-10-20 – 2022-10-22 (×5): 40 mg via INTRAVENOUS
  Filled 2022-10-20 (×5): qty 10

## 2022-10-20 MED ORDER — PANTOPRAZOLE SODIUM 40 MG PO TBEC: 40.0000 mg | DELAYED_RELEASE_TABLET | Freq: Two times a day (BID) | ORAL | Status: DC

## 2022-10-20 MED ORDER — SODIUM CHLORIDE 0.9% FLUSH
10.0000 mL | Freq: Two times a day (BID) | INTRAVENOUS | Status: DC
  Administered 2022-10-20 – 2022-10-22 (×6): 10 mL

## 2022-10-20 NOTE — Progress Notes (Signed)
Patient is not able to walk the distance required to go the bathroom, or he/she is unable to safely negotiate stairs required to access the bathroom.  A 3in1 BSC will alleviate this problem  

## 2022-10-20 NOTE — Progress Notes (Signed)
Progress Note   Patient: Michele James KVQ:[id number] DOB: Aug 17, 1955 DOA: 10/18/2022     1 DOS: the patient was seen and examined on 10/20/2022   Brief hospital course: 67 y.o. 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   Michele James states that she has been experiencing a runny nose with congestion, productive cough for at least over 1 week now.  She cannot recall exactly when shortness of breath began.  She denies any chest pain or palpitations.  She denies any nausea, vomiting, diarrhea.  She notes she has chronically black stools that are intermittent with her last episode yesterday.  She believes her bowel movement today was brown  On arrival to the ED, patient's blood pressure was 105/56 with heart rate of 93.  She was saturating at 99% on 2 L due to hypoxia as low as 87 on room air.  She was afebrile at 99.3.  Patient's blood pressure has fluctuated in the ED from as low as 95/55 to 158/80.  Initial workup notable for WBC of 12.3, hemoglobin of 8.1, sodium of 131, glucose 139, creatinine 0.43, albumin 2.5, GFR above 60.  Troponin 34.  Lactic acid 2.2 with improvement to 0.8.  Procalcitonin less than 0.1.   COVID-19, influenza and RSV PCR negative.  CTA was obtained that demonstrated patchy lung opacities in the right upper, right lower, and left lower lobes concerning for multifocal pneumonia, but no evidence of PE.  Patient started on azithromycin and ceftriaxone.  TRH contacted for admission.   7/21.  Speech pathology wants to do a modified barium swallow tomorrow.  Patient still on 2 L of oxygen this morning.  Continue antibiotics.  PT and OT recommending a hospital bed and home health.  Received IV iron. 7/22.  Did okay with modified barium swallow.  Had hypoxia overnight.   Will get an overnight pulse oximetry.  Try to get off oxygen during the day.  Continue IV Unasyn.  Assessment and Plan: * Acute respiratory failure with hypoxia (HCC) Pulse ox 88% in triage.  On 2 L of oxygen this morning with low pulse ox overnight.  Will taper to room air again during the day.  Get an overnight oximetry on room air at night.  Multifocal pneumonia Patient on Unasyn.  I added Zithromax to cover atypicals.  Sepsis ruled out.  Lactic acidosis Improved  Melena Hemoglobin on the lower side but stable.  Continue PPI.  Can have black stools with iron supplementation.    Iron deficiency anemia due to chronic blood loss Last hemoglobin 8.0 with a ferritin of 25.  Patient given IV iron overnight.  Continue oral iron.  Dysphagia Speech therapy placed back on dysphagia 1 diet with thin liquids because she did well with the swallow test today.  Schizophreniform disorder (HCC) Continue home Zyprexa, Paxil  Type 2 diabetes mellitus without complication, without long-term current use of insulin (HCC) History of type 2 diabetes, currently diet controlled.  Last A1c of 5.3% approximately 2 weeks ago.  No indication for SSI at this time.  Seizure (HCC) Continue Tegretol  Hypothyroidism (acquired) Continue Synthroid  Cerebral palsy (HCC) Patient has a history of cerebral palsy with multiple contractures that make mobility difficult.  PT and OT recommending home health and a hospital bed.        Subjective: Patient still with cough.  Had hypoxia overnight requiring oxygen to be placed back on.  Admitted with multifocal pneumonia.  Physical Exam: Vitals:   10/19/22 1915 10/19/22 2020 10/20/22 0351 10/20/22 0759  BP: (!) 109/50 129/67 (!) 124/55 (!) 136/51  Pulse: 98  94 94  Resp:  (!) 22 (!) 22 20  Temp: 98.7 F (37.1 C) 99.6 F (37.6 C) 97.7 F (36.5 C) 98.8 F (37.1 C)  TempSrc: Oral Oral Oral   SpO2: 90% (!) 85% 93% 95%  Weight:      Height:       Physical  Exam HENT:     Head: Normocephalic.     Mouth/Throat:     Pharynx: No oropharyngeal exudate.  Eyes:     General: Lids are normal.     Conjunctiva/sclera: Conjunctivae normal.  Cardiovascular:     Rate and Rhythm: Normal rate and regular rhythm.     Heart sounds: Normal heart sounds, S1 normal and S2 normal.  Pulmonary:     Breath sounds: Examination of the right-lower field reveals decreased breath sounds. Examination of the left-lower field reveals decreased breath sounds. Decreased breath sounds present. No wheezing, rhonchi or rales.  Abdominal:     Palpations: Abdomen is soft.     Tenderness: There is no abdominal tenderness.  Musculoskeletal:     Right lower leg: Swelling present.     Left lower leg: Swelling present.  Skin:    General: Skin is warm.     Findings: No rash.  Neurological:     Mental Status: She is alert.     Comments: Answers all questions appropriately.  Left arm contracted.  Barely able to straight leg raise either leg.     Data Reviewed: White blood cell count 10.4, hemoglobin 8.0, platelet count 389  Family Communication: Spoke with husband on the phone  Disposition: Status is: Inpatient Remains inpatient appropriate because: Patient was hypoxic overnight we will give an overnight oximetry test.  Planned Discharge Destination: Home with Home Health    Time spent: 28 minutes  Author: Alford Highland, MD 10/20/2022 2:30 PM  For on call review [email or web address].

## 2022-10-20 NOTE — Progress Notes (Signed)
Modified Barium Swallow Study  Patient Details  Name: Michele James MRN: [id number] Date of Birth: Jan 07, 1956  Today's Date: 10/20/2022  Modified Barium Swallow completed.  Full report located under Chart Review in the Imaging Section.  History of Present Illness Per H&P "Michele James is a 67 y.o. 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     Michele James states that she has been experiencing a runny nose with congestion, productive cough for at least over 1 week now.  She cannot recall exactly when shortness of breath began.  She denies any chest pain or palpitations.  She denies any nausea, vomiting, diarrhea.  She notes she has chronically black stools that are intermittent with her last episode yesterday.  She believes her bowel movement today was brown.  She denies any dizziness.  She notes that earlier today, she was sitting on the commode and tried to get up but was unable to do so.  Her mother tried to help her then but they both fell down onto the floor and she hit her left knee.  She denies any loss of consciousness or other injuries." Chest CT "1. No evidence of pulmonary embolism.  2. Patchy lung opacities in the dependent portions of the right  upper and right lower lobe and left lower lobe with atelectasis. The  findings are consistent with multifocal pneumonia involving  bilateral lungs predominantly lower lobes, follow-up examination to  resolution is recommended.  3. Moderate size hiatal hernia.  4. 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  5. Emphysema. "   Clinical Impression Pt presents with minimal oropharyngeal dysphagia as evidenced by reduced base of tongue retraction and partial epiglottic inversion, leading to trace  lingual/vallecular/UES residue. Residue cleared with independent subsequent swallow. No observed aspiration/penetration across consistencies. Pt demonstrating independent oral control and manipulation. Based on baseline diet and pt preference for avoiding advanced solids, cracker not assessed this date. Upper esophageal retention noted with higher viscosity trials, recommend further GI work up. Recommend continuation of current puree and thin liquids diet with aspiration/esophageal precautions (slow rate, small bites, elevated HOB during and 60 min following intake, and alert for PO intake). No further SLP services indicated at this time.  Factors that may increase risk of adverse event in presence of aspiration Michele James [employer] Michele James 2021): Frail or deconditioned;Respiratory or GI disease  Swallow Evaluation Recommendations Recommendations: PO diet PO Diet Recommendation: Dysphagia 1 (Pureed);Thin liquids (Level 0) Liquid Administration via: Straw;Cup Medication Administration: Crushed with puree Supervision: Staff to assist with self-feeding;Intermittent supervision/cueing for swallowing strategies Swallowing strategies  : Slow rate;Small bites/sips Postural changes: Stay upright 30-60 min after meals;Position pt fully upright for meals Oral care recommendations: Oral care BID (2x/day) Recommended consults: Consider GI consultation    [place] Asir Bingley Clapp  MS [facility] SLP   [place] J Clapp 10/20/2022,9:09 AM

## 2022-10-20 NOTE — Plan of Care (Signed)

## 2022-10-20 NOTE — Progress Notes (Signed)

## 2022-10-20 NOTE — Progress Notes (Signed)
Patient has (Cerebral Palsy with contractures)  which requires (all body parts ) to be positioned in ways not feasible with a normal bed.  Patient has left arm contracture and BLE contractures.  Head must be elevated at least (30 degrees) to prevent  aspiration. (Cerebral Palsy  with contractures requires  frequent/immediate) changes in body position which cannot be achieved with a normal bed.

## 2022-10-21 ENCOUNTER — Inpatient Hospital Stay: Payer: Medicare Other

## 2022-10-21 DIAGNOSIS — D500 Iron deficiency anemia secondary to blood loss (chronic): Secondary | ICD-10-CM | POA: Diagnosis not present

## 2022-10-21 DIAGNOSIS — E872 Acidosis, unspecified: Secondary | ICD-10-CM | POA: Diagnosis not present

## 2022-10-21 DIAGNOSIS — J189 Pneumonia, unspecified organism: Secondary | ICD-10-CM | POA: Diagnosis not present

## 2022-10-21 DIAGNOSIS — R197 Diarrhea, unspecified: Secondary | ICD-10-CM

## 2022-10-21 DIAGNOSIS — R7989 Other specified abnormal findings of blood chemistry: Secondary | ICD-10-CM

## 2022-10-21 DIAGNOSIS — J9601 Acute respiratory failure with hypoxia: Secondary | ICD-10-CM | POA: Diagnosis not present

## 2022-10-21 LAB — CBC
HCT: 25.9 % — ABNORMAL LOW (ref 36.0–46.0)
Hemoglobin: 8.1 g/dL — ABNORMAL LOW (ref 12.0–15.0)
MCH: 29.1 pg (ref 26.0–34.0)
MCHC: 31.3 g/dL (ref 30.0–36.0)
MCV: 93.2 fL (ref 80.0–100.0)
Platelets: 407 10*3/uL — ABNORMAL HIGH (ref 150–400)
RBC: 2.78 MIL/uL — ABNORMAL LOW (ref 3.87–5.11)
RDW: 13.5 % (ref 11.5–15.5)
WBC: 9.6 10*3/uL (ref 4.0–10.5)
nRBC: 0 % (ref 0.0–0.2)

## 2022-10-21 LAB — CULTURE, BLOOD (ROUTINE X 2): Culture: NO GROWTH

## 2022-10-21 LAB — URINALYSIS, ROUTINE W REFLEX MICROSCOPIC
Bilirubin Urine: NEGATIVE
Glucose, UA: NEGATIVE mg/dL
Hgb urine dipstick: NEGATIVE
Ketones, ur: NEGATIVE mg/dL
Leukocytes,Ua: NEGATIVE
Nitrite: NEGATIVE
Protein, ur: NEGATIVE mg/dL
Specific Gravity, Urine: 1.01 (ref 1.005–1.030)
pH: 7 (ref 5.0–8.0)

## 2022-10-21 LAB — BRAIN NATRIURETIC PEPTIDE: B Natriuretic Peptide: 439.9 pg/mL — ABNORMAL HIGH (ref 0.0–100.0)

## 2022-10-21 MED ORDER — CEFUROXIME AXETIL 500 MG PO TABS
500.0000 mg | ORAL_TABLET | Freq: Two times a day (BID) | ORAL | Status: DC
  Administered 2022-10-21 – 2022-10-22 (×2): 500 mg via ORAL
  Filled 2022-10-21 (×3): qty 1

## 2022-10-21 MED ORDER — FUROSEMIDE 10 MG/ML IJ SOLN
20.0000 mg | Freq: Once | INTRAMUSCULAR | Status: AC
  Administered 2022-10-21: 20 mg via INTRAVENOUS
  Filled 2022-10-21: qty 2

## 2022-10-21 NOTE — Progress Notes (Signed)
Progress Note   Patient: Michele James GNF:[id number] DOB: 02/18/1956 DOA: 10/18/2022     2 DOS: the patient was seen and examined on 10/21/2022   Brief hospital course: 67 y.o. 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   Ms. Low states that she has been experiencing a runny nose with congestion, productive cough for at least over 1 week now.  She cannot recall exactly when shortness of breath began.  She denies any chest pain or palpitations.  She denies any nausea, vomiting, diarrhea.  She notes she has chronically black stools that are intermittent with her last episode yesterday.  She believes her bowel movement today was brown  On arrival to the ED, patient's blood pressure was 105/56 with heart rate of 93.  She was saturating at 99% on 2 L due to hypoxia as low as 87 on room air.  She was afebrile at 99.3.  Patient's blood pressure has fluctuated in the ED from as low as 95/55 to 158/80.  Initial workup notable for WBC of 12.3, hemoglobin of 8.1, sodium of 131, glucose 139, creatinine 0.43, albumin 2.5, GFR above 60.  Troponin 34.  Lactic acid 2.2 with improvement to 0.8.  Procalcitonin less than 0.1.   COVID-19, influenza and RSV PCR negative.  CTA was obtained that demonstrated patchy lung opacities in the right upper, right lower, and left lower lobes concerning for multifocal pneumonia, but no evidence of PE.  Patient started on azithromycin and ceftriaxone.  [facility] contacted for admission.   7/21.  Speech pathology wants to do a modified barium swallow tomorrow.  Patient still on 2 L of oxygen this morning.  Continue antibiotics.  PT and OT recommending a hospital bed and home health.  Received IV iron. 7/22.  Did okay with modified barium swallow.  Had hypoxia overnight.   Will get an overnight pulse oximetry.  Try to get off oxygen during the day.  Continue IV Unasyn. 7/23.  Patient had quite a few episodes of diarrhea overnight.  Abdominal x-ray ordered.  Assessment and Plan: * Acute respiratory failure with hypoxia (HCC) Pulse ox 88% in triage.  Patient qualifies for nocturnal oxygen with overnight oximetry test.  Continue oxygen at night.  Patient able to come off oxygen during the day.  Diarrhea Could be secondary to antibiotics.  Abdominal x-ray ordered to rule out constipation since she does not walk around.  Multifocal pneumonia Switched Unasyn to cefuroxime with diarrhea.  Continue Zithromax.  Sepsis ruled out.  Lactic acidosis Improved  Melena Hemoglobin on the lower side but stable.  Continue PPI.  Can have black stools with iron supplementation.   Iron deficiency anemia due to chronic blood loss Last hemoglobin 8.1 with a ferritin of 25.  Patient given IV during the hospital course.  Continue oral iron.  Dysphagia Speech therapy placed back on dysphagia 1 diet with thin liquids because she did well with the swallow test today.  Elevated brain natriuretic peptide (BNP) level Will check an echocardiogram and give a little low-dose Lasix.  Schizophreniform disorder (HCC) Continue home Zyprexa, Paxil  Type 2 diabetes mellitus without complication, without long-term current use of insulin (HCC) History of type 2 diabetes, currently diet controlled.  Last A1c of 5.3% approximately 2 weeks ago.  No indication for SSI at this time.  Seizure (HCC) Continue Tegretol  Hypothyroidism (acquired) Continue Synthroid  Cerebral palsy (HCC) Patient has a history of cerebral palsy with multiple contractures that make mobility difficult.  PT and OT recommending home health and a hospital bed.        Subjective: Patient having a lot of diarrhea overnight and today.  Will hold off on disposition.  Patient feels okay.  Still has a little bit of  cough and some phlegm.  Physical Exam: Vitals:   10/20/22 1712 10/20/22 1944 10/21/22 0445 10/21/22 0807  BP: (!) 117/53 (!) 108/48 121/61 (!) 134/53  Pulse: (!) 106 100 84 89  Resp: 18 18 18 16   Temp: 98.6 F (37 C) 98.7 F (37.1 C) 97.7 F (36.5 C) 98.2 F (36.8 C)  TempSrc: Oral Oral Oral   SpO2: (!) 83% (!) 87% 92% 94%  Weight:      Height:       Physical Exam HENT:     Head: Normocephalic.     Mouth/Throat:     Pharynx: No oropharyngeal exudate.  Eyes:     General: Lids are normal.     Conjunctiva/sclera: Conjunctivae normal.  Cardiovascular:     Rate and Rhythm: Normal rate and regular rhythm.     Heart sounds: Normal heart sounds, S1 normal and S2 normal.  Pulmonary:     Breath sounds: Examination of the right-lower field reveals decreased breath sounds. Examination of the left-lower field reveals decreased breath sounds. Decreased breath sounds present. No wheezing, rhonchi or rales.  Abdominal:     Palpations: Abdomen is soft.     Tenderness: There is no abdominal tenderness.  Musculoskeletal:     Right lower leg: Swelling present.     Left lower leg: Swelling present.  Skin:    General: Skin is warm.     Findings: No rash.  Neurological:     Mental Status: She is alert.     Comments: Answers all questions appropriately.  Left arm contracted.  Barely able to straight leg raise either leg.     Data Reviewed: BNP 439.9, hemoglobin 8.1, platelet count 4 7  Family Communication: Updated patient's father on the phone  Disposition: Status is: Inpatient Remains inpatient appropriate because: Patient with quite a few episodes of diarrhea overnight and again this morning.  Will watch another night in the hospital.  Planned Discharge Destination: Home with Home Health    Time spent: 28 minutes  Author: Alford Highland, MD 10/21/2022 2:36 PM  For on call review [email or web address].

## 2022-10-21 NOTE — Progress Notes (Signed)
Physical Therapy Treatment Patient Details Name: Michele James MRN: [id number] DOB: 04-05-1955 Today's Date: 10/21/2022   History of Present Illness Patient is a 67 year old female who presents with shortness of breath.She was sitting on commode and unable to get up, mother tried to help her and they both fell onto the floor and she hit her L knee.  PMH includes type II DM, CP with contractures limiting ability to ambulate, seizure disorder, schizophreniform disorder, osteoporosis, hypothyroidism, depression, GERD, arthritis,and HLD.    PT Comments  Patient is agreeable to PT. She requested to sit up on edge of bed. Max A required for bed mobility with cues for sequencing. Sitting balance is fair with unilateral UE support. Sp02 96% on room air while sitting. Patient is fatigued with activity. Recommend to continue PT to maximize independence and facilitate return to prior level of function.     Assistance Recommended at Discharge Frequent or constant Supervision/Assistance  If plan is discharge home, recommend the following:  Can travel by private vehicle    Two people to help with walking and/or transfers;Two people to help with bathing/dressing/bathroom;Direct supervision/assist for medications management;Assistance with cooking/housework;Direct supervision/assist for financial management;Assist for transportation;Help with stairs or ramp for entrance      Equipment Recommendations  Other (comment);Hospital bed    Recommendations for Other Services       Precautions / Restrictions Precautions Precautions: Fall Restrictions Weight Bearing Restrictions: No     Mobility  Bed Mobility Overal bed mobility: Needs Assistance Bed Mobility: Supine to Sit, Sit to Supine Rolling: Mod assist   Supine to sit: Max assist Sit to supine: Max assist   General bed mobility comments: assistance for trunk and BLE support. verbal cues for sequencing    Transfers Overall transfer level:  Needs assistance Equipment used: Rolling walker (2 wheels) Transfers: Bed to chair/wheelchair/BSC            Lateral/Scoot Transfers: Max assist General transfer comment: assistance required for very limited lateral scooting to the right. activity tolerance is limited by fatigue. anticipate the need for +2 person for OOB activity which was deferred at this time due to frequency of bowel movements and need for frequent hygiene assistance    Ambulation/Gait                   Stairs             Wheelchair Mobility     Tilt Bed    Modified Rankin (Stroke Patients Only)       Balance Overall balance assessment: Needs assistance Sitting-balance support: Single extremity supported, Feet supported Sitting balance-Leahy Scale: Fair Sitting balance - Comments: no loss of balance while sitting. patient was able to brush her hair and teeth using RUE with set-up and no loss of balance                                    Cognition Arousal/Alertness: Awake/alert Behavior During Therapy: WFL for tasks assessed/performed Overall Cognitive Status: No family/caregiver present to determine baseline cognitive functioning                                 General Comments: Patient able to follow single step commands consistently        Exercises      General Comments General comments (skin integrity, edema, etc.):  Sp02 96% on room air while sitting      Pertinent Vitals/Pain Pain Assessment Pain Assessment: No/denies pain    Home Living                          Prior Function            PT Goals (current goals can now be found in the care plan section) Acute Rehab PT Goals Patient Stated Goal: to return home PT Goal Formulation: With patient Time For Goal Achievement: 11/02/22 Potential to Achieve Goals: Fair Progress towards PT goals: Progressing toward goals    Frequency    Min 1X/week      PT Plan Frequency  needs to be updated (frequency adjusted for patient centered delivery of care model implementation)    Co-evaluation              AM-PAC PT "6 Clicks" Mobility   Outcome Measure  Help needed turning from your back to your side while in a flat bed without using bedrails?: A Lot Help needed moving from lying on your back to sitting on the side of a flat bed without using bedrails?: A Lot Help needed moving to and from a bed to a chair (including a wheelchair)?: Total Help needed standing up from a chair using your arms (e.g., wheelchair or bedside chair)?: Total Help needed to walk in hospital room?: Total Help needed climbing 3-5 steps with a railing? : Total 6 Click Score: 8    End of Session   Activity Tolerance: Patient tolerated treatment well Patient left: in bed;with call bell/phone within reach;with bed alarm set Nurse Communication: Mobility status PT Visit Diagnosis: Other abnormalities of gait and mobility (R26.89);Muscle weakness (generalized) (M62.81)     Time: 1660-6301 PT Time Calculation (min) (ACUTE ONLY): 30 min  Charges:    $Therapeutic Activity: 23-37 mins PT General Charges $$ ACUTE PT VISIT: 1 Visit                     Donna Bernard, PT, MPT    Ina Homes 10/21/2022, 12:26 PM

## 2022-10-21 NOTE — Assessment & Plan Note (Signed)
Will check an echocardiogram and give a little low-dose Lasix.

## 2022-10-21 NOTE — Assessment & Plan Note (Signed)
Could be secondary to antibiotics.  Abdominal x-ray ordered to rule out constipation since she does not walk around.

## 2022-10-22 ENCOUNTER — Inpatient Hospital Stay (HOSPITAL_COMMUNITY)
Admit: 2022-10-22 | Discharge: 2022-10-22 | Disposition: A | Discharge status: Home / Self Care | Payer: Medicare Other | Attending: Internal Medicine | Admitting: Internal Medicine

## 2022-10-22 DIAGNOSIS — J9601 Acute respiratory failure with hypoxia: Secondary | ICD-10-CM | POA: Diagnosis not present

## 2022-10-22 DIAGNOSIS — R0609 Other forms of dyspnea: Secondary | ICD-10-CM | POA: Diagnosis not present

## 2022-10-22 LAB — CBC
HCT: 24.9 % — ABNORMAL LOW (ref 36.0–46.0)
Hemoglobin: 8.2 g/dL — ABNORMAL LOW (ref 12.0–15.0)
MCH: 29.5 pg (ref 26.0–34.0)
MCHC: 32.9 g/dL (ref 30.0–36.0)
MCV: 89.6 fL (ref 80.0–100.0)
Platelets: 288 10*3/uL (ref 150–400)
RBC: 2.78 MIL/uL — ABNORMAL LOW (ref 3.87–5.11)
RDW: 13.5 % (ref 11.5–15.5)
WBC: 6.9 10*3/uL (ref 4.0–10.5)
nRBC: 0 % (ref 0.0–0.2)

## 2022-10-22 LAB — CULTURE, BLOOD (ROUTINE X 2)

## 2022-10-22 LAB — ECHOCARDIOGRAM COMPLETE
AR max vel: 2.62 cm2
AV Area VTI: 2.7 cm2
AV Area mean vel: 2.38 cm2
AV Mean grad: 4.5 mmHg
AV Peak grad: 7.7 mmHg
Ao pk vel: 1.39 m/s
Area-P 1/2: 4.77 cm2
Height: 63 in
MV VTI: 2.28 cm2
S' Lateral: 2 cm
Weight: 2116.42 oz

## 2022-10-22 LAB — BASIC METABOLIC PANEL
Anion gap: 7 (ref 5–15)
BUN: 8 mg/dL (ref 8–23)
CO2: 26 mmol/L (ref 22–32)
Calcium: 8.1 mg/dL — ABNORMAL LOW (ref 8.9–10.3)
Chloride: 104 mmol/L (ref 98–111)
Creatinine, Ser: 0.33 mg/dL — ABNORMAL LOW (ref 0.44–1.00)
GFR, Estimated: >60 mL/min (ref >60)
Glucose, Bld: 124 mg/dL — ABNORMAL HIGH (ref 70–99)
Potassium: 4.1 mmol/L (ref 3.5–5.1)
Sodium: 137 mmol/L (ref 135–145)

## 2022-10-22 MED ORDER — GUAIFENESIN ER 600 MG PO TB12: 600.0000 mg | ORAL_TABLET | Freq: Two times a day (BID) | ORAL | 0 refills | Status: AC

## 2022-10-22 MED ORDER — CEFUROXIME AXETIL 500 MG PO TABS: 500.0000 mg | ORAL_TABLET | Freq: Two times a day (BID) | ORAL | 0 refills | Status: AC

## 2022-10-22 NOTE — Discharge Summary (Signed)
Physician Discharge Summary   Patient: Michele James MRN: [id number] DOB: Dec 11, 1955  Admit date:     10/18/2022  Discharge date: 10/22/22  Discharge Physician: Enedina Finner   PCP: Dorothey Baseman, MD   Recommendations at discharge:   F/u PCP in 1-2 weeks  Discharge Diagnoses: Principal Problem:   Acute respiratory failure with hypoxia [facility]) Active Problems:   Multifocal pneumonia   Diarrhea   Lactic acidosis   Iron deficiency anemia due to chronic blood loss   Melena   Dysphagia   Cerebral palsy (HCC)   Hypothyroidism (acquired)   Seizure (HCC)   Type 2 diabetes mellitus without complication, without long-term current use of insulin (HCC)   Schizophreniform disorder (HCC)   Elevated brain natriuretic peptide (BNP) level  67 y.o. 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  CTA was obtained that demonstrated patchy lung opacities in the right upper, right lower, and left lower lobes concerning for multifocal pneumonia, but no evidence of PE. Patient started on azithromycin and ceftriaxone.   Acute respiratory failure with hypoxia (HCC) Pulse ox 88% in triage.  On 2 L of oxygen this morning with low pulse ox overnight.  Will taper to room air again during the day.  Get an overnight oximetry on room air at night. --pt did qualify for home oxygen. --oxygen and hospital bed has been set up for home --HHPT/OT    Multifocal pneumonia --Patient on Unasyn--change to po cefuroxime due to diarrea. Completed Zithromax to cover atypicals.  Sepsis ruled out.   Lactic acidosis --Improved   Melena --Hemoglobin on the lower side but stable.  Continue PPI.  Can have black stools with iron supplementation.     Iron deficiency anemia due to chronic blood loss --Last  hemoglobin 8.0 with a ferritin of 25.  Patient given IV iron overnight.  Continue oral iron.   Dysphagia --Speech therapy placed back on dysphagia 1 diet with thin liquids because she did well with the swallow test today.   Schizophreniform disorder (HCC) --Continue home Zyprexa, Paxil   Type 2 diabetes mellitus without complication, without long-term current use of insulin (HCC) --History of type 2 diabetes, currently diet controlled.  Last A1c of 5.3% approximately 2 weeks ago.  No indication for SSI at this time.   Seizure (HCC) --Continue Tegretol   Hypothyroidism (acquired) --Continue Synthroid   Cerebral palsy (HCC) Patient has a history of cerebral palsy with multiple contractures that make mobility difficult.  PT and OT recommending home health and a hospital bed--delivered to home  Patient overall at baseline. She will go home with oxygen. Will defer to PCP regarding weaning her off oxygen if possible as outpatient. Patient's diarrhea improved.     Pain control - [employer] Controlled Substance Reporting System database was reviewed. and patient was instructed, not to drive, operate heavy machinery, perform activities at heights, swimming or participation in water activities or provide baby-sitting services while on Pain, Sleep and Anxiety Medications; until their outpatient Physician has advised to do so again. Also recommended to not to take more than prescribed Pain, Sleep and Anxiety Medications.  Consultants: none Disposition: Home health Diet recommendation:  Discharge Diet Orders (From admission, onward)     Start     Ordered   10/22/22 0000  Diet - low sodium heart healthy  10/22/22 1140           Cardiac diet DISCHARGE MEDICATION: Allergies as of 10/22/2022       Reactions   Aminophylline Other (See Comments)   Headache   Cinnamon Other (See Comments)   Stomachache    Amoxicillin Rash   Latex Rash   Nsaids Rash   Penicillins Nausea And  Vomiting   Potassium Nausea And Vomiting, Nausea Only   Potassium-containing Compounds Nausea And Vomiting   Septra [sulfamethoxazole-trimethoprim] Rash   Sulfa Antibiotics Rash   Sulfasalazine Rash   Tape Rash   Paper tape ok   Theophyllines Other (See Comments)   Headache        Medication List     TAKE these medications    acetaminophen 325 MG tablet Commonly known as: TYLENOL Take 325 mg by mouth every 6 (six) hours as needed.   alendronate 70 MG tablet Commonly known as: FOSAMAX Take 70 mg by mouth once a week. Take with a full glass of water on an empty stomach.   ALPRAZolam 0.25 MG tablet Commonly known as: XANAX Take 0.25 mg by mouth as needed for anxiety. Before pap smears   atorvastatin 40 MG tablet Commonly known as: LIPITOR Take 40 mg by mouth daily.   calcium citrate-vitamin D 315-200 MG-UNIT tablet Commonly known as: CITRACAL+D Take 1 tablet by mouth 2 (two) times daily.   carbamazepine 200 MG tablet Commonly known as: TEGRETOL Take 200 mg by mouth 3 (three) times daily.   cefUROXime 500 MG tablet Commonly known as: CEFTIN Take 1 tablet (500 mg total) by mouth 2 (two) times daily with a meal for 3 doses.   Cholecalciferol 100 MCG (4000 UT) Tabs Take 1,000 Units by mouth.   esomeprazole 40 MG capsule Commonly known as: NEXIUM Take 40 mg by mouth daily at 12 noon.   famotidine 20 MG tablet Commonly known as: PEPCID Take 20 mg by mouth 2 (two) times daily.   guaiFENesin 600 MG 12 hr tablet Commonly known as: MUCINEX Take 1 tablet (600 mg total) by mouth 2 (two) times daily for 3 days.   hydroxypropyl methylcellulose / hypromellose 2.5 % ophthalmic solution Commonly known as: ISOPTO TEARS / GONIOVISC 1 drop as needed for dry eyes.   iron polysaccharides 150 MG capsule Commonly known as: NIFEREX Take 1 capsule (150 mg total) by mouth 2 (two) times daily for 30 days, THEN 1 capsule (150 mg total) daily. Start taking on: April 21, 2022    levothyroxine 100 MCG tablet Commonly known as: SYNTHROID Take 100 mcg by mouth every morning.   magnesium oxide 400 MG tablet Commonly known as: MAG-OX Take 1 tablet by mouth 3 (three) times daily.   naproxen 375 MG tablet Commonly known as: NAPROSYN Take 375 mg by mouth 2 (two) times daily with a meal.   OLANZapine 5 MG tablet Commonly known as: ZYPREXA Take 1 tablet (5 mg total) by mouth daily.   OLANZapine 10 MG tablet Commonly known as: ZYPREXA Take 1 tablet (10 mg total) by mouth at bedtime.   PARoxetine 20 MG tablet Commonly known as: PAXIL Take 1 tablet (20 mg total) by mouth daily.   potassium chloride 20 MEQ/15ML (10%) Soln Take 10 mEq by mouth daily.   vitamin E 180 MG (400 UNITS) capsule Take 400 Units by mouth daily.               Durable Medical Equipment  (From admission, onward)  Start     Ordered   10/21/22 1023  For home use only DME oxygen  Once       Question Answer Comment  Length of Need Lifetime   Mode or (Route) Nasal cannula   Liters per Minute 2   Frequency Only at night (stationary unit needed)   Oxygen conserving device Yes   Oxygen delivery system Gas      10/21/22 1022   10/20/22 1611  For home use only DME 3 n 1  Once        10/20/22 1610   10/19/22 1339  For home use only DME Hospital bed  Once       Question Answer Comment  Length of Need Lifetime   Patient has (list medical condition): Cerebral palsy   The above medical condition requires: Patient requires the ability to reposition frequently   Head must be elevated greater than: 30 degrees   Bed type Semi-electric   Hoyer Lift Yes   Support Surface: Low Air loss Mattress      10/19/22 1338            Follow-up Information     Dorothey Baseman, MD. Schedule an appointment as soon as possible for a visit in 1 week(s).   Specialty: Family Medicine Contact information: [address] [place] [address] [phone]                 Discharge Exam: Filed Weights   10/18/22 1040  Weight: 60 kg  alert and oriented times three. Not in acute distress. Respiratory clear to auscultation decreased breath sounds basis. No wheezing. Cardiovascular both heart sounds normal rate rhythm regular. Abdomen soft benign. Extremities chronic contractors both upper and lower extremity.   Condition at discharge: fair  The results of significant diagnostics from this hospitalization (including imaging, microbiology, ancillary and laboratory) are listed below for reference.   Imaging Studies: DG Abd 2 Views  Result Date: 10/21/2022 CLINICAL DATA:  10056 Diarrhea 10056 EXAM: ABDOMEN - 2 VIEW COMPARISON:  11/15/2015 FINDINGS: New left retrocardiac consolidation with possible small left effusion. Cholecystectomy clips. No free air. Stomach and small bowel decompressed. Residual oral contrast material in the colon. Calcified mesenteric lymph nodes as before. Mild lumbar levoscoliosis with multilevel spondylitic change. IMPRESSION: 1. No acute findings. 2. New left retrocardiac consolidation with possible small left effusion. Electronically Signed   By: Corlis Leak M.D.   On: 10/21/2022 14:33   DG Swallowing Func-Speech Pathology  Result Date: 10/20/2022 Table formatting from the original result was not included. Modified Barium Swallow Study Patient Details Name: Michele James MRN: [id number] Date of Birth: [birth year] Today's Date: 10/20/2022 HPI/PMH: HPI: Per H&P "Michele James is a 67 y.o. 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     Ms. Bow states that she has been experiencing a runny nose with congestion, productive cough for at least over 1 week now.  She cannot recall exactly when shortness of breath began.  She  denies any chest pain or palpitations.  She denies any nausea, vomiting, diarrhea.  She notes she has chronically black stools that are intermittent with her last episode yesterday.  She believes her bowel movement today was brown.  She denies any dizziness.  She notes that earlier today, she was sitting on the commode and tried to  get up but was unable to do so.  Her mother tried to help her then but they both fell down onto the floor and she hit her left knee.  She denies any loss of consciousness or other injuries." Chest CT "1. No evidence of pulmonary embolism.  2. Patchy lung opacities in the dependent portions of the right  upper and right lower lobe and left lower lobe with atelectasis. The  findings are consistent with multifocal pneumonia involving  bilateral lungs predominantly lower lobes, follow-up examination to  resolution is recommended.  3. Moderate size hiatal hernia.  4. 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  5. Emphysema. " Clinical Impression: Clinical Impression: Pt presents with minimal oropharyngeal dysphagia as evidenced by reduced base of tongue retraction and partial epiglottic inversion, leading to trace lingual/vallecular residue/UES residue. Residue cleared with independent subsequent swallow. No observed aspiration/penetration across consistencies. Pt demonstrating independent oral control and manipulation. Based on baseline diet and pt preference for avoiding advanced solids, cracker not assessed this date. Upper esophageal retention noted with higher viscosity trials, recommend further GI work up. Recommend continuation of current puree and thin liquids diet with aspiration/esophageal precautions (slow rate, small bites, elevated HOB during and 60 min following intake, and alert for PO intake). No further SLP services indicated at this time. Factors that may increase risk of adverse event in presence of aspiration Michele Oaks [employer] Clearance Coots 2021):  Factors that may increase risk of adverse event in presence of aspiration Michele [employer]aks [employer] Clearance Coots 2021): Frail or deconditioned; Respiratory or GI disease Recommendations/Plan: Swallowing Evaluation Recommendations Swallowing Evaluation Recommendations Recommendations: PO diet PO Diet Recommendation: Dysphagia 1 (Pureed); Thin liquids (Level 0) Liquid Administration via: Straw; Cup Medication Administration: Crushed with puree Supervision: Staff to assist with self-feeding; Intermittent supervision/cueing for swallowing strategies Swallowing strategies  : Slow rate; Small bites/sips Postural changes: Stay upright 30-60 min after meals; Position pt fully upright for meals Oral care recommendations: Oral care BID (2x/day) Recommended consults: Consider GI consultation Treatment Plan Treatment Plan Treatment recommendations: No treatment recommended at this time Follow-up recommendations: No SLP follow up Recommendations Comment: pending results of MBSS Functional status assessment: Patient has not had a recent decline in their functional status. Recommendations Recommendations for follow up therapy are one component of a multi-disciplinary discharge planning process, led by the attending physician.  Recommendations may be updated based on patient status, additional functional criteria and insurance authorization. Assessment: Orofacial Exam: Orofacial Exam Oral Cavity - Dentition: Adequate natural dentition (some chipped teeth) Anatomy: Anatomy: WFL Boluses Administered: Boluses Administered Boluses Administered: Thin liquids (Level 0); Mildly thick liquids (Level 2, nectar thick); Puree; Moderately thick liquids (Level 3, honey thick)  Oral Impairment Domain: Oral Impairment Domain Lip Closure: No labial escape Tongue control during bolus hold: Cohesive bolus between tongue to palatal seal Bolus preparation/mastication: Timely and efficient chewing and mashing Bolus transport/lingual motion: Brisk tongue motion Oral  residue: Trace residue lining oral structures Location of oral residue : Tongue Initiation of pharyngeal swallow : Valleculae  Pharyngeal Impairment Domain: Pharyngeal Impairment Domain Soft palate elevation: No bolus between soft palate (SP)/pharyngeal wall (PW) Laryngeal elevation: Partial superior movement of thyroid cartilage/partial approximation of arytenoids to epiglottic petiole Anterior hyoid excursion: Complete anterior movement Epiglottic movement: Partial inversion Laryngeal vestibule closure: Complete, no air/contrast in laryngeal vestibule Pharyngeal stripping wave : Present - complete Pharyngeal contraction (A/P view only): N/A Pharyngoesophageal segment opening: Partial distention/partial duration, partial obstruction of flow Tongue base retraction: Trace column of contrast or air between  tongue base and PPW Pharyngeal residue: Trace residue within or on pharyngeal structures Location of pharyngeal residue: Valleculae  Esophageal Impairment Domain: Esophageal Impairment Domain Esophageal clearance upright position: Esophageal retention (noted with honey thick liquids) Pill: Pill Consistency administered: -- (n/a) Penetration/Aspiration Scale Score: Penetration/Aspiration Scale Score 1.  Material does not enter airway: Thin liquids (Level 0); Mildly thick liquids (Level 2, nectar thick); Moderately thick liquids (Level 3, honey thick); Puree Compensatory Strategies: Compensatory Strategies Compensatory strategies: No   General Information: No data recorded Diet Prior to this Study: Dysphagia 1 (pureed); Thin liquids (Level 0)   Temperature : Normal (WBC 11.0)   Respiratory Status: WFL   Supplemental O2: Nasal cannula (2L)   History of Recent Intubation: No  Behavior/Cognition: Alert; Cooperative; Pleasant mood Self-Feeding Abilities: Able to self-feed Baseline vocal quality/speech: Normal No data recorded Volitional Swallow: Able to elicit Exam Limitations: No limitations Goal Planning: Prognosis for  improved oropharyngeal function: Fair No data recorded No data recorded Patient/Family Stated Goal: to stop "choking" Consulted and agree with results and recommendations: Patient; Physician; Nurse Pain: Pain Assessment Pain Assessment: No/denies pain Pain Score: -- (unrated; moderate) Pain Location: feet Pain Descriptors / Indicators: Aching Pain Intervention(s): Limited activity within patient's tolerance; Monitored during session End of Session: Start Time:SLP Start Time (ACUTE ONLY): 0815 Stop Time: SLP Stop Time (ACUTE ONLY): 0845 Time Calculation:SLP Time Calculation (min) (ACUTE ONLY): 30 min Charges: SLP Evaluations $ SLP Speech Visit: 1 Visit SLP Evaluations $BSS Swallow: 1 Procedure $MBS Swallow: 1 Procedure SLP visit diagnosis: SLP Visit Diagnosis: Dysphagia, oropharyngeal phase (R13.12) Past Medical History: Past Medical History: Diagnosis Date  Anemia   Arthritis   knees  Cerebral palsy (HCC)   Depression   Diabetes mellitus without complication (HCC)   Diverticulosis   Dysphagia   Eczema   GERD (gastroesophageal reflux disease)   Glaucoma   Hemorrhoids   Hyperlipidemia   Hypothyroidism   Seizures (HCC)   none in over 10 yrs Past Surgical History: Past Surgical History: Procedure Laterality Date  BREAST BIOPSY Right 03/22/2020  stereo bx, ribbon clip, path pending   CATARACT EXTRACTION W/PHACO Right 02/06/2016  Procedure: CATARACT EXTRACTION PHACO AND INTRAOCULAR LENS PLACEMENT (IOC);  Surgeon: Lockie Mola, MD;  Location: [facility];  Service: Ophthalmology;  Laterality: Right;  CATARACT EXTRACTION W/PHACO Left 03/05/2016  Procedure: CATARACT EXTRACTION PHACO AND INTRAOCULAR LENS PLACEMENT (IOC);  Surgeon: Lockie Mola, MD;  Location: [facility];  Service: Ophthalmology;  Laterality: Left;  CHOLECYSTECTOMY N/A 11/16/2015  Procedure: LAPAROSCOPIC CHOLECYSTECTOMY;  Surgeon: Tiney Rouge III, MD;  Location: [facility];  Service: General;  Laterality: N/A;  attempted cholangiogram   COLONOSCOPY WITH PROPOFOL N/A 12/25/2014  Procedure: COLONOSCOPY WITH PROPOFOL;  Surgeon: Wallace Cullens, MD;  Location: [facility];  Service: Gastroenterology;  Laterality: N/A;  COLONOSCOPY WITH PROPOFOL N/A 04/14/2019  Procedure: COLONOSCOPY WITH PROPOFOL;  Surgeon: Toledo, Boykin Nearing, MD;  Location: [facility];  Service: Gastroenterology;  Laterality: N/A;  COLONOSCOPY WITH PROPOFOL N/A 04/20/2022  Procedure: COLONOSCOPY WITH PROPOFOL;  Surgeon: Wyline Mood, MD;  Location: [facility];  Service: Gastroenterology;  Laterality: N/A;  ENDOSCOPIC RETROGRADE CHOLANGIOPANCREATOGRAPHY (ERCP) WITH PROPOFOL N/A 11/18/2015  Procedure: ENDOSCOPIC RETROGRADE CHOLANGIOPANCREATOGRAPHY (ERCP) WITH PROPOFOL;  Surgeon: Midge Minium, MD;  Location: [facility];  Service: Endoscopy;  Laterality: N/A;  ESOPHAGOGASTRODUODENOSCOPY (EGD) WITH PROPOFOL N/A 04/14/2019  Procedure: ESOPHAGOGASTRODUODENOSCOPY (EGD) WITH PROPOFOL;  Surgeon: Toledo, Boykin Nearing, MD;  Location: [facility];  Service: Gastroenterology;  Laterality: N/A;  ESOPHAGOGASTRODUODENOSCOPY (EGD) WITH PROPOFOL N/A 04/20/2022  Procedure: ESOPHAGOGASTRODUODENOSCOPY (EGD) WITH PROPOFOL;  Surgeon: Wyline Mood, MD;  Location: [facility];  Service: Gastroenterology;  Laterality: N/A;  EYE SURGERY    Cataract Surgery   HERNIA REPAIR   [place] Jarrett Clapp MS [facility] SLP [place] J Clapp 10/20/2022, 2:38 PM  CT Angio Chest PE W and/or Wo Contrast  Result Date: 10/18/2022 CLINICAL DATA:  Pulmonary embolism suspected. Fall in the bathroom today. Ongoing shortness of breath. EXAM: CT ANGIOGRAPHY CHEST WITH CONTRAST TECHNIQUE: Multidetector CT imaging of the chest was performed using the standard protocol during bolus administration of intravenous contrast. Multiplanar CT image reconstructions and MIPs were obtained to evaluate the vascular anatomy. RADIATION DOSE REDUCTION: This exam was performed according to the departmental dose-optimization program which includes automated  exposure control, adjustment of the mA and/or kV according to patient size and/or use of iterative reconstruction technique. CONTRAST:  75mL OMNIPAQUE IOHEXOL 350 MG/ML SOLN COMPARISON:  None Available. FINDINGS: Cardiovascular: Satisfactory opacification of the pulmonary arteries to the segmental level. No evidence of pulmonary embolism. Normal heart size. No pericardial effusion. Mediastinum/Nodes: No enlarged mediastinal, hilar, or axillary lymph nodes. Thyroid gland, trachea, and esophagus demonstrate no significant findings. Lungs/Pleura: Centrilobular emphysematous changes. Patchy lung opacities in the dependent portions of the right upper and right lower lobe. Patchy lung opacities in the left lower lobe with atelectasis. No pleural effusion. Upper Abdomen: Moderate size hiatal hernia. 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 Review of the MIP images confirms the above findings. IMPRESSION: 1. No evidence of pulmonary embolism. 2. Patchy lung opacities in the dependent portions of the right upper and right lower lobe and left lower lobe with atelectasis. The findings are consistent with multifocal pneumonia involving bilateral lungs predominantly lower lobes, follow-up examination to resolution is recommended. 3. Moderate size hiatal hernia. 4. 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 5. Emphysema. Emphysema (ICD10-J43.9). Electronically Signed   By: Larose Hires D.O.   On: 10/18/2022 15:01   DG Chest 2 View  Result Date: 10/18/2022 CLINICAL DATA:  Suspected sepsis EXAM: CHEST - 2 VIEW COMPARISON:  05/16/2020 FINDINGS: Retrocardiac opacity that is a hiatal hernia by April 2024 chest CT. Low volume chest. There is no edema, consolidation, effusion, or pneumothorax. Hazy obscuration of the lateral left diaphragm attributed to mediastinal fat by prior CT. Remote left rib fractures. IMPRESSION: 1. No acute finding. 2.  Moderate hiatal hernia. Electronically Signed   By: Tiburcio Pea M.D.   On: 10/18/2022 12:10    Microbiology: Results for orders placed or performed during the hospital encounter of 10/18/22  Culture, blood (Routine x 2)     Status: None (Preliminary result)   Collection Time: 10/18/22 10:48 AM   Specimen: BLOOD  Result Value Ref Range Status   Specimen Description BLOOD BLOOD RIGHT FOREARM  Final   Special Requests   Final    BOTTLES DRAWN AEROBIC AND ANAEROBIC Blood Culture adequate volume   Culture   Final    NO GROWTH 4 DAYS Performed at [facility], [address], [place], [place] [address]    Report Status PENDING  Incomplete  Resp panel by RT-PCR (RSV, Flu A&B, Covid) Anterior Nasal Swab     Status: None   Collection Time: 10/18/22 12:08 PM   Specimen: Anterior Nasal Swab  Result Value Ref Range Status   SARS Coronavirus 2 by RT PCR NEGATIVE NEGATIVE Final    Comment: (NOTE) SARS-CoV-2 target nucleic acids are NOT DETECTED.  The SARS-CoV-2 RNA is generally  detectable in upper respiratory specimens during the acute phase of infection. The lowest concentration of SARS-CoV-2 viral copies this assay can detect is 138 copies/mL. A negative result does not preclude SARS-Cov-2 infection and should not be used as the sole basis for treatment or other patient management decisions. A negative result may occur with  improper specimen collection/handling, submission of specimen other than nasopharyngeal swab, presence of viral mutation(s) within the areas targeted by this assay, and inadequate number of viral copies(<138 copies/mL). A negative result must be combined with clinical observations, patient history, and epidemiological information. The expected result is Negative.  Fact Sheet for Patients:  BloggerCourse.com  Fact Sheet for Healthcare Providers:  SeriousBroker.it  This test is no t yet approved or  cleared by the [place] FDA and  has been authorized for detection and/or diagnosis of SARS-CoV-2 by FDA under an Emergency Use Authorization (EUA). This EUA will remain  in effect (meaning this test can be used) for the duration of the COVID-19 declaration under Section 564(b)(1) of the Act, 21 U.S.C.section 360bbb-3(b)(1), unless the authorization is terminated  or revoked sooner.       Influenza A by PCR NEGATIVE NEGATIVE Final   Influenza B by PCR NEGATIVE NEGATIVE Final    Comment: (NOTE) The Xpert Xpress SARS-CoV-2/FLU/RSV plus assay is intended as an aid in the diagnosis of influenza from Nasopharyngeal swab specimens and should not be used as a sole basis for treatment. Nasal washings and aspirates are unacceptable for Xpert Xpress SARS-CoV-2/FLU/RSV testing.  Fact Sheet for Patients: BloggerCourse.com  Fact Sheet for Healthcare Providers: SeriousBroker.it  This test is not yet approved or cleared by the [place] FDA and has been authorized for detection and/or diagnosis of SARS-CoV-2 by FDA under an Emergency Use Authorization (EUA). This EUA will remain in effect (meaning this test can be used) for the duration of the COVID-19 declaration under Section 564(b)(1) of the Act, 21 U.S.C. section 360bbb-3(b)(1), unless the authorization is terminated or revoked.     Resp Syncytial Virus by PCR NEGATIVE NEGATIVE Final    Comment: (NOTE) Fact Sheet for Patients: BloggerCourse.com  Fact Sheet for Healthcare Providers: SeriousBroker.it  This test is not yet approved or cleared by the [place] FDA and has been authorized for detection and/or diagnosis of SARS-CoV-2 by FDA under an Emergency Use Authorization (EUA). This EUA will remain in effect (meaning this test can be used) for the duration of the COVID-19 declaration under Section 564(b)(1) of the Act, 21  U.S.C. section 360bbb-3(b)(1), unless the authorization is terminated or revoked.  Performed at [facility], [address] [place], [place] [address]   Culture, blood (Routine x 2)     Status: None (Preliminary result)   Collection Time: 10/18/22  7:11 PM   Specimen: BLOOD  Result Value Ref Range Status   Specimen Description BLOOD BLOOD RIGHT HAND  Final   Special Requests   Final    IN PEDIATRIC BOTTLE Blood Culture results may not be optimal due to an inadequate volume of blood received in culture bottles   Culture   Final    NO GROWTH 4 DAYS Performed at [facility], [address] [place], [place] [address]    Report Status PENDING  Incomplete    Labs: CBC: Recent Labs  Lab 10/18/22 1048 10/19/22 0340 10/20/22 0932 10/21/22 0317 10/22/22 0702  WBC 12.3* 11.0* 10.4 9.6 6.9  NEUTROABS 9.0* 7.2  --   --   --   HGB  8.1* 8.1* 8.0* 8.1* 8.2*  HCT 25.6* 26.8* 25.7* 25.9* 24.9*  MCV 93.4 94.7 94.5 93.2 89.6  PLT 394 410* 389 407* 288   Basic Metabolic Panel: Recent Labs  Lab 10/18/22 1048 10/19/22 0340 10/22/22 0702  NA 131* 137 137  K 3.9 3.8 4.1  CL 99 105 104  CO2 26 26 26   GLUCOSE 139* 133* 124*  BUN 18 11 8   CREATININE 0.43* 0.39* 0.33*  CALCIUM 8.1* 8.1* 8.1*   Liver Function Tests: Recent Labs  Lab 10/18/22 1048  AST 19  ALT 18  ALKPHOS 82  BILITOT 0.6  PROT 6.3*  ALBUMIN 2.5*    Discharge time spent: greater than 30 minutes.  Signed: Enedina Finner, MD [facility] 10/22/2022

## 2022-10-22 NOTE — Care Management Important Message (Signed)
Important Message  Patient Details  Name: Michele James MRN: [id number] Date of Birth: Dec 15, 1955   Medicare Important Message Given:  Yes  I reviewed the form with the patient and obtained her signature. Stated she understood her rights. Will send the signed copy to Health Information Management to be scanned into the patient's medical record.   Olegario Messier A Michaela Broski 10/22/2022, 11:13 AM

## 2022-10-22 NOTE — Plan of Care (Addendum)
Patient family and care taker called and said they will take her in wheelchair. Nurse informed pt was scheduled to pickup via EMS. Family said they don't want wait anymore. Caretaker arrived at bedside.PIV removed. Pt discharge home.   Problem: Education: Goal: Knowledge of General Education information will improve Description: Including pain rating scale, medication(s)/side effects and non-pharmacologic comfort measures Outcome: Adequate for Discharge   Problem: Health Behavior/Discharge Planning: Goal: Ability to manage health-related needs will improve Outcome: Adequate for Discharge   Problem: Clinical Measurements: Goal: Ability to maintain clinical measurements within normal limits will improve Outcome: Adequate for Discharge Goal: Will remain free from infection Outcome: Adequate for Discharge Goal: Diagnostic test results will improve Outcome: Adequate for Discharge Goal: Respiratory complications will improve Outcome: Adequate for Discharge Goal: Cardiovascular complication will be avoided Outcome: Adequate for Discharge   Problem: Activity: Goal: Risk for activity intolerance will decrease Outcome: Adequate for Discharge   Problem: Nutrition: Goal: Adequate nutrition will be maintained Outcome: Adequate for Discharge   Problem: Coping: Goal: Level of anxiety will decrease Outcome: Adequate for Discharge   Problem: Elimination: Goal: Will not experience complications related to bowel motility Outcome: Adequate for Discharge Goal: Will not experience complications related to urinary retention Outcome: Adequate for Discharge   Problem: Pain Managment: Goal: General experience of comfort will improve Outcome: Adequate for Discharge   Problem: Safety: Goal: Ability to remain free from injury will improve Outcome: Adequate for Discharge   Problem: Skin Integrity: Goal: Risk for impaired skin integrity will decrease Outcome: Adequate for Discharge   Problem:  Acute Rehab OT Goals (only OT should resolve) Goal: Pt. Will Transfer To Toilet Outcome: Adequate for Discharge Goal: Pt. Will Perform Toileting-Clothing Manipulation Outcome: Adequate for Discharge Goal: Pt. Will Perform Tub/Shower Transfer Outcome: Adequate for Discharge   Problem: Acute Rehab PT Goals(only PT should resolve) Goal: Pt will Roll Supine to Side Outcome: Adequate for Discharge Goal: Pt Will Go Supine/Side To Sit Outcome: Adequate for Discharge Goal: Patient Will Perform Sitting Balance Outcome: Adequate for Discharge

## 2022-10-22 NOTE — TOC Progression Note (Signed)
Transition of Care [facility]) - Progression Note    Patient Details  Name: Michele James MRN: [id number] Date of Birth: March 29, 1956  [facility]) CM/SW Contact  Garret Reddish, RN Phone Number: 10/22/2022, 12:13 PM  Clinical Narrative:   I have spoken with [name] he reports that hospital bed and home 02 was delivered to the home on yesterday.  He has requested ambulance transport for [name] today.    I will arrange ambulance transport to patients home for today via [facility].      Expected Discharge Plan: Home w Home Health Services Barriers to Discharge: No Barriers Identified  Expected Discharge Plan and Services   Discharge Planning Services: CM Consult Post Acute Care Choice:  (Father has declined home health services.) Living arrangements for the past 2 months: Single Family Home Expected Discharge Date: 10/22/22                 DME Agency: [employer] Date DME Agency Contacted: 10/20/22   Representative spoke with at DME Agency: Esperanza Richters Arranged:  (Patient's father has declined home health)           Social Determinants of Health (SDOH) Interventions SDOH Screenings   Food Insecurity: No Food Insecurity (10/01/2022)   Received from [facility], [facility]  Housing: Low Risk  (04/18/2022)  Transportation Needs: No Transportation Needs (10/01/2022)   Received from [facility], [facility]  Utilities: Not At Risk (10/01/2022)   Received from [facility], [facility]  Depression [phone]): Low Risk  (02/03/2022)  Financial Resource Strain: Low Risk  (10/01/2022)   Received from [facility], [facility]  Tobacco Use: Medium Risk (10/18/2022)    Readmission Risk Interventions     No data to display

## 2022-10-22 NOTE — TOC Initial Note (Signed)
Transition of Care [facility]) - Initial/Assessment Note    Patient Details  Name: Michele James MRN: [id number] Date of Birth: January 19, 1956  Transition of Care [facility]) CM/SW Contact:    Garret Reddish, RN Phone Number: 10/22/2022, 11:45 AM  Clinical Narrative:   I have spoken with patient's father and caregiver Michele James.  Michele James reports that prior to admission Michele James lived at home with him and his wife.  He informs me that patient has Cerebral Palsy.  He reports that patient is not able to walk.  Patient gets around in a wheelchair.  Michele James informs me that he has caregiver for Michele James and his wife who also requires care at home.  The caregiver is an [employer] fundraiser and comes twice daily, 7 days a week to assist with the care of Michele James her mother. The care giver assist with ADL's, meals, and other task per the family request.  The nurse also transports patient to appointments.    Michele James reports that patient has a BSC, Hoyer lift, and wheelchair at home.  Michele James has requested a hospital bed for home use.    I have ordered Hospital bed from [employer].  I have given referral to Michele James with [employer]. Michele James also requested a BSC.  I informed Michele James that the patient had a BSC in the last five years and insurance would not cover.  Michele James reports that he has another [facility] at home that he will use.  Patient will also require nocturnal home 02.  I have asked Michele James with [employer] to arranged for home use.   I have spoken with Michele James about Home Health recommendation and he has declined. He does not feel like home health benefits Michele James.    TOC will continue to follow for discharge planning.      Expected Discharge Plan: Home w Home Health Services Barriers to Discharge: No Barriers Identified   Patient Goals and CMS Choice   CMS Medicare.gov Compare Post Acute Care list provided to:: Other (Comment Required) (Patient 's father) Choice offered to / list presented to :  Parent      Expected Discharge Plan and Services   Discharge Planning Services: CM Consult Post Acute Care Choice:  (Father has declined home health services.) Living arrangements for the past 2 months: Single Family Home Expected Discharge Date: 10/22/22                 DME Agency: [employer] Date DME Agency Contacted: 10/20/22   Representative spoke with at DME Agency: Esperanza Richters Arranged:  (Patient's father has declined home health)          Prior Living Arrangements/Services Living arrangements for the past 2 months: Single Family Home Lives with:: Parents   Do you feel safe going back to the place where you live?: Yes        Care giver support system in place?: Yes (comment) (Patient has supportive parents and caregiver) Current home services: DME (Patient has a wheelchair, Nurse, adult, and a bedside commode.)    Activities of Daily Living      Permission Sought/Granted                  Emotional Assessment Appearance:: Appears stated age   Affect (typically observed): Appropriate Orientation: : Oriented to Self, Oriented to Place, Oriented to  Time, Oriented to Situation      Admission diagnosis:  CAP (community acquired pneumonia) [J18.9] Sepsis due  to pneumonia (HCC) [J18.9, A41.9] Acute respiratory failure with hypoxia (HCC) [J96.01] Patient Active Problem List   Diagnosis Date Noted   Diarrhea 10/21/2022   Elevated brain natriuretic peptide (BNP) level 10/21/2022   Acute respiratory failure with hypoxia (HCC) 10/19/2022   Lactic acidosis 10/19/2022   Multifocal pneumonia 10/18/2022   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   Melena 10/18/2022   Lung nodule 04/18/2022   Symptomatic anemia 04/18/2022   Iron deficiency anemia due to chronic blood loss 04/18/2022   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   Visual hallucinations 03/18/2021   Type 2 diabetes  mellitus without complication, without long-term current use of insulin (HCC) 12/23/2017   Calculus of common duct    Elevated bilirubin    Dilated cbd, acquired    Cholecystitis with cholelithiasis 11/15/2015   RUQ pain    Cerebral palsy (HCC) 11/08/2014   Osteoporosis, 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 [place] [address] Phone: [phone] Fax: [phone]  [id number] [place],  - [address] [place] [address] Phone: [phone] Fax: [phone]     Social Determinants of Health (SDOH) Social History: SDOH Screenings   Food Insecurity: No Food Insecurity (10/01/2022)   Received from [facility], [facility]  Housing: Low Risk  (04/18/2022)  Transportation Needs: No Transportation Needs (10/01/2022)   Received from [facility], [facility]  Utilities: Not At Risk (10/01/2022)   Received from [facility], [facility]  Depression [phone]): Low Risk  (02/03/2022)  Financial Resource Strain: Low Risk  (10/01/2022)   Received from [facility], [facility]  Tobacco Use: Medium Risk (10/18/2022)   SDOH Interventions:     Readmission Risk Interventions     No data to display

## 2022-10-22 NOTE — Progress Notes (Signed)
*  PRELIMINARY RESULTS* Echocardiogram 2D Echocardiogram has been performed.  Michele James 10/22/2022, 1:59 PM

## 2022-10-23 ENCOUNTER — Other Ambulatory Visit: Payer: Self-pay | Admitting: Psychiatry

## 2022-10-23 LAB — CULTURE, BLOOD (ROUTINE X 2)
Culture: NO GROWTH
Special Requests: ADEQUATE

## 2022-11-06 ENCOUNTER — Encounter: Payer: Self-pay | Admitting: Oncology

## 2022-11-16 NOTE — Progress Notes (Unsigned)
BH MD/PA/NP OP Progress Note  11/20/2022 10:23 AM Michele James  MRN:  [id number]  Chief Complaint:  Chief Complaint  Patient presents with   Follow-up   HPI:  According to the chart review, the following events have occurred since the last visit: The patient was admitted for acute respiratory failure with hypoxia in the setting of multifocal pneumonia.   This is a follow-up appointment for schizophrenia, depression.  She states that she is doing good.  She had leg tremor and last night.  She was fixated on this topic and would abruptly mention "leg tremors, left" whenever she was asked other questions during the visit.  She states that she had seizure when she was living however several years ago.  She enjoys watching TV.  When she was asked about recent admission, she states that she does not know why she was admitted.  She has been doing good since discharged.  When she was asked about her mood, she answers "I don't know what xray show," referring to her recent admission.   Orientation- "I don't know." Spring, 2024  Her father presents to the visit.  She sleeps from 330 pm through 7 AM.  Although she may knock from her room at night, it happened only a few times.  Her father states that she had a vision of seeing a boy, and asked him to pick up in the yard, although nobody was there.  There was an episode of her staring in the yard for 2.5 hours without doing anything else, or not responding.  Her father is unsure whether she had seizure disorder.  She does not have any neurologist for follow-up.    Wt Readings from Last 3 Encounters:  10/18/22 132 lb 4.4 oz (60 kg)  09/29/22 130 lb (59 kg)  04/18/22 130 lb (59 kg)     Visit Diagnosis: No diagnosis found.  Past Psychiatric History: Please see initial evaluation for full details. I have reviewed the history. No updates at this time.     Past Medical History:  Past Medical History:  Diagnosis Date   Anemia    Arthritis    knees    Cerebral palsy (HCC)    Depression    Diabetes mellitus without complication (HCC)    Diverticulosis    Dysphagia    Eczema    GERD (gastroesophageal reflux disease)    Glaucoma    Hemorrhoids    Hyperlipidemia    Hypothyroidism    Seizures (HCC)    none in over 10 yrs    Past Surgical History:  Procedure Laterality Date   BREAST BIOPSY Right 03/22/2020   stereo bx, ribbon clip, path pending    CATARACT EXTRACTION W/PHACO Right 02/06/2016   Procedure: CATARACT EXTRACTION PHACO AND INTRAOCULAR LENS PLACEMENT (IOC);  Surgeon: Lockie Mola, MD;  Location: [facility];  Service: Ophthalmology;  Laterality: Right;   CATARACT EXTRACTION W/PHACO Left 03/05/2016   Procedure: CATARACT EXTRACTION PHACO AND INTRAOCULAR LENS PLACEMENT (IOC);  Surgeon: Lockie Mola, MD;  Location: [facility];  Service: Ophthalmology;  Laterality: Left;   CHOLECYSTECTOMY N/A 11/16/2015   Procedure: LAPAROSCOPIC CHOLECYSTECTOMY;  Surgeon: Tiney Rouge III, MD;  Location: [facility] OR[facility];  Service: General;  Laterality: N/A;  attempted cholangiogram   COLONOSCOPY WITH PROPOFOL N/A 12/25/2014   Procedure: COLONOSCOPY WITH PROPOFOL;  Surgeon: Wallace Cullens, MD;  Location: [facility];  Service: Gastroenterology;  Laterality: N/A;   COLONOSCOPY WITH PROPOFOL N/A 04/14/2019   Procedure: COLONOSCOPY WITH PROPOFOL;  Surgeon: Toledo, Boykin Nearing, MD;  Location: [facility] ENDOSCOPY;  Service: Gastroenterology;  Laterality: N/A;   COLONOSCOPY WITH PROPOFOL N/A 04/20/2022   Procedure: COLONOSCOPY WITH PROPOFOL;  Surgeon: Wyline Mood, MD;  Location: [facility] ENDOSCOPY;  Service: Gastroenterology;  Laterality: N/A;   ENDOSCOPIC RETROGRADE CHOLANGIOPANCREATOGRAPHY (ERCP) WITH PROPOFOL N/A 11/18/2015   Procedure: ENDOSCOPIC RETROGRADE CHOLANGIOPANCREATOGRAPHY (ERCP) WITH PROPOFOL;  Surgeon: Midge Minium, MD;  Location: [facility] ENDOSCOPY;  Service: Endoscopy;  Laterality: N/A;   ESOPHAGOGASTRODUODENOSCOPY (EGD) WITH PROPOFOL N/A  04/14/2019   Procedure: ESOPHAGOGASTRODUODENOSCOPY (EGD) WITH PROPOFOL;  Surgeon: Toledo, Boykin Nearing, MD;  Location: [facility] ENDOSCOPY;  Service: Gastroenterology;  Laterality: N/A;   ESOPHAGOGASTRODUODENOSCOPY (EGD) WITH PROPOFOL N/A 04/20/2022   Procedure: ESOPHAGOGASTRODUODENOSCOPY (EGD) WITH PROPOFOL;  Surgeon: Wyline Mood, MD;  Location: [facility] ENDOSCOPY;  Service: Gastroenterology;  Laterality: N/A;   EYE SURGERY     Cataract Surgery    HERNIA REPAIR      Family Psychiatric History: Please see initial evaluation for full details. I have reviewed the history. No updates at this time.     Family History:  Family History  Problem Relation Age of Onset   High blood pressure Mother    Hyperlipidemia Mother    Diabetes Mellitus II Mother    Stroke Mother    Osteoporosis Mother    High blood pressure Father    Breast cancer Maternal Aunt    Stroke Maternal Grandfather    Diabetes Mellitus II Maternal Grandfather    Stroke Maternal Grandmother    Diabetes Mellitus II Maternal Grandmother     Social History:  Social History   Socioeconomic History   Marital status: Single    Spouse name: Not on file   Number of children: Not on file   Years of education: Not on file   Highest education level: Not on file  Occupational History   Not on file  Tobacco Use   Smoking status: Former    Current packs/day: 0.00    Types: Cigarettes    Quit date: 06/20/1980    Years since quitting: 42.4   Smokeless tobacco: Never   Tobacco comments:    smoked socially in early 1980s (reports for 4 months)  Vaping Use   Vaping status: Never Used  Substance and Sexual Activity   Alcohol use: No   Drug use: No   Sexual activity: Not Currently  Other Topics Concern   Not on file  Social History Narrative   Not on file   Social Determinants of Health   Financial Resource Strain: Low Risk  (10/01/2022)   Received from [facility], [facility]   Overall Financial  Resource Strain (CARDIA)    Difficulty of Paying Living Expenses: Not hard at all  Food Insecurity: No Food Insecurity (10/01/2022)   Received from [facility], [facility]   Hunger Vital Sign    Worried About Running Out of Food in the Last Year: Never true    Ran Out of Food in the Last Year: Never true  Transportation Needs: No Transportation Needs (10/01/2022)   Received from [facility], [facility]   [facility] - Transportation    In the past 12 months, has lack of transportation kept you from medical appointments or from getting medications?: No    Lack of Transportation (Non-Medical): No  Physical Activity: Not on file  Stress: Not on file  Social Connections: Not on file    Allergies:  Allergies  Allergen Reactions   Aminophylline Other (See Comments)    Headache   Cinnamon Other (See Comments)    Stomachache    Amoxicillin Rash   Latex Rash   Nsaids Rash   Penicillins Nausea And Vomiting   Potassium Nausea And Vomiting and Nausea Only   Potassium-Containing Compounds Nausea And Vomiting   Septra [Sulfamethoxazole-Trimethoprim] Rash   Sulfa Antibiotics Rash   Sulfasalazine Rash   Tape Rash    Paper tape ok   Theophyllines Other (See Comments)    Headache     Metabolic Disorder Labs: No results found for: "HGBA1C", "MPG" No results found for: "PROLACTIN" No results found for: "CHOL", "TRIG", "HDL", "CHOLHDL", "VLDL", "LDLCALC" Lab Results  Component Value Date   TSH 0.686 03/17/2021   TSH 0.172 (L) 05/16/2020    Therapeutic Level Labs: No results found for: "LITHIUM" No results found for: "VALPROATE" No results found for: "CBMZ"  Current Medications: Current Outpatient Medications  Medication Sig Dispense Refill   acetaminophen (TYLENOL) 325 MG tablet Take 325 mg by mouth every 6 (six) hours as needed.     alendronate (FOSAMAX) 70 MG tablet Take 70 mg by mouth once a week. Take with a  full glass of water on an empty stomach.     ALPRAZolam (XANAX) 0.25 MG tablet Take 0.25 mg by mouth as needed for anxiety. Before pap smears     atorvastatin (LIPITOR) 40 MG tablet Take 40 mg by mouth daily.     calcium citrate-vitamin D (CITRACAL+D) 315-200 MG-UNIT per tablet Take 1 tablet by mouth 2 (two) times daily.     carbamazepine (TEGRETOL) 200 MG tablet Take 200 mg by mouth 3 (three) times daily.     Cholecalciferol 100 MCG (4000 UT) TABS Take 1,000 Units by mouth.     esomeprazole (NEXIUM) 40 MG capsule Take 40 mg by mouth daily at 12 noon.     famotidine (PEPCID) 20 MG tablet Take 20 mg by mouth 2 (two) times daily.     hydroxypropyl methylcellulose / hypromellose (ISOPTO TEARS / GONIOVISC) 2.5 % ophthalmic solution 1 drop as needed for dry eyes.     levothyroxine (SYNTHROID) 100 MCG tablet Take 100 mcg by mouth every morning.     magnesium oxide (MAG-OX) 400 MG tablet Take 1 tablet by mouth 3 (three) times daily.     naproxen (NAPROSYN) 375 MG tablet Take 375 mg by mouth 2 (two) times daily with a meal.     OLANZapine (ZYPREXA) 10 MG tablet Take 1 tablet (10 mg total) by mouth at bedtime. 90 tablet 0   OLANZapine (ZYPREXA) 5 MG tablet Take 1 tablet (5 mg total) by mouth daily. 90 tablet 0   PARoxetine (PAXIL) 20 MG tablet Take 1 tablet (20 mg total) by mouth daily. 90 tablet 1   potassium chloride 20 MEQ/15ML (10%) SOLN Take 10 mEq by mouth daily.     vitamin E 400 UNIT capsule Take 400 Units by mouth daily.     iron polysaccharides (NIFEREX) 150 MG capsule Take 1 capsule (150 mg total) by mouth 2 (two) times daily for 30 days, THEN 1 capsule (150 mg total) daily. 150 capsule 0   No current facility-administered medications for this visit.     Musculoskeletal: Strength & Muscle Tone:  Normal Gait & Station:  in a wheel chair Patient leans: N/A  Psychiatric Specialty Exam: Review of Systems  Psychiatric/Behavioral:  Positive for dysphoric mood and hallucinations. Negative  for agitation, behavioral problems, confusion, decreased concentration,  self-injury, sleep disturbance and suicidal ideas. The patient is nervous/anxious. The patient is not hyperactive.   All other systems reviewed and are negative.   Blood pressure 119/60, pulse 95, temperature 98.2 F (36.8 C), temperature source Skin, height 5\' 3"  (1.6 m).Body mass index is 23.43 kg/m.  General Appearance: Fairly Groomed  Eye Contact:  Good  Speech:  Clear and Coherent  Volume:  Normal  Mood:   good  Affect:  Appropriate, Blunt, and Congruent- slightly brighter compared to before  Thought Process:  Disorganized  Orientation:  Other:  oriented to self  Thought Content: Rumination   Suicidal Thoughts:  No  Homicidal Thoughts:  No  Memory:  Immediate;   Good  Judgement:  Fair  Insight:  Lacking  Psychomotor Activity:  Normal, left arm internally rotated, right arm- no rigidity, no tremors, normal tone, no TD  Concentration:  Concentration: Good and Attention Span: Good  Recall:  Good  Fund of Knowledge: Good  Language: Good  Akathisia:  No  Handed:  Right  AIMS (if indicated): not done  Assets:  Desire for Improvement  ADL's:  Intact  Cognition: WNL  Sleep:  Good   Screenings: GAD-7    Flowsheet Row Office Visit from 12/23/2021 in [facility]  Total GAD-7 Score 1      PHQ2-9    Flowsheet Row Office Visit from 02/03/2022 in [facility] Office Visit from 12/23/2021 in [facility] Office Visit from 11/04/2021 in [facility]  PHQ-2 Total Score 0 0 0      Flowsheet Row ED to Hosp-Admission (Discharged) from 10/18/2022 in [facility] MEDICAL TELEMETRY ED to Hosp-Admission (Discharged) from 04/18/2022 in [facility] (1A) Office Visit from 02/03/2022 in [facility]  C-SSRS RISK CATEGORY No Risk No Risk No Risk        Assessment and Plan:  Michele James is a 67 y.o. year old female with a history of depression, anxiety, seizure disorder, cerebral palsy, GERD, osteoporosis , who presents for follow up appointment for below.   1. Schizophrenia, unspecified type (HCC) 2. MDD (major depressive disorder), recurrent, in partial remission (HCC) Acute stressors include: n/a Chronic stressors include: unemployment, limited care (elderly father, RN twice a week)   Transferred from [facility]/Dr. Johny Drilling. Admitted a few times, first at age 12 for depression.  According to the chart review, her father reports history of schizophrenia. No known developmental or intellectual disability. Collateral is limited due to her elderly father, who has memory loss. (Originally on olanzapine 2.5 mg in AM, 10 mg qhs, Paxil 20 mg daily)   Exam is notable for slightly less blunt affect, and she reports overall improvement in her mood symptoms.  She continues to demonstrate irrelevant, disorganized thought process, and rumination on somatic symptoms, although she has been calmer on most recent visits including today.  Although uptitration of olanzapine to be considered, will not do it at this time given the hallucinations is not debilitating, and to mitigate risk of drowsiness.  Will continue olanzapine to target schizophrenia along with Paxil for depression.   3. Seizure disorder (HCC) According to the chart review, she has a diagnosis of seizure disorder and has been on carbamazepine.  Her father reports episode of staring without responding, and it is not clear whether this is related to seizure disorder, and/or schizophrenia.  Given she does  not have any follow-up with neurologist, will make this referral to establish the care.      # cognition  Mini cog- 1/3 (unable to tell date, wrote two 12 in clock) Functional Status   IADL: Independent in the following:             Requires assistance with the following: managing finances, medications, driving ADL  Independent in the following: walking          Requires assistance with the following:bathing and hygiene, feeding, continence, grooming and toileting,  Folate, Vitamin B12 (wnl 42/2024), TSH low, fT4 0.99 05/2012) Images Head CT 02/2021 Brain: Normal anatomic configuration. Moderate periventricular white matter changes are present likely reflecting the sequela of small vessel ischemia, stable since prior examination. 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 No abnormal mass effect or midline shift. No evidence of acute intracranial hemorrhage or infarct. Ventricular size is normal. Cerebellum unremarkable. Neuropsych assessment:  Etiology: r/o vascular   No change. Her father reports some concern about memory loss. IADL is limited primarily due to her condition of cerebral palsy.  Will continue to assess as needed.    Plan Continue olanzapine 5 mg in the a.m., 10 mg at night - EKG QTc 386 msec 03/2022, hr 71 Continue Paxil 20 mg daily  Next appointment: 11/11 at 11 AM, IP Referral to neurology - on carbamazepine 200 mg TID for seizure    The patient demonstrates the following risk factors for suicide: Chronic risk factors for suicide include: psychiatric disorder of depression . Acute risk factors for suicide include: unemployment. Protective factors for this patient include: positive social support and hope for the future. Considering these factors, the overall suicide risk at this point appears to be low. Patient is appropriate for outpatient follow up.   Collaboration of Care: Collaboration of Care: Other reviewed notes in Epic  Patient/Guardian was advised Release of Information must be obtained prior to any record release in order to collaborate their care with an  outside provider. Patient/Guardian was advised if they have not already done so to contact the registration department to sign all necessary forms in order for [place] to release information regarding their care.   Consent: Patient/Guardian gives verbal consent for treatment and assignment of benefits for services provided during this visit. Patient/Guardian expressed understanding and agreed to proceed.    Neysa Hotter, MD 11/20/2022, 10:23 AM

## 2022-11-20 ENCOUNTER — Ambulatory Visit (INDEPENDENT_AMBULATORY_CARE_PROVIDER_SITE_OTHER): Payer: Medicare Other | Admitting: Psychiatry

## 2022-11-20 ENCOUNTER — Encounter: Payer: Self-pay | Admitting: Psychiatry

## 2022-11-20 VITALS — BP 119/60 | HR 95 | Temp 98.2°F | Ht 63.0 in

## 2022-11-20 DIAGNOSIS — F209 Schizophrenia, unspecified: Secondary | ICD-10-CM | POA: Diagnosis not present

## 2022-11-20 DIAGNOSIS — G40909 Epilepsy, unspecified, not intractable, without status epilepticus: Secondary | ICD-10-CM | POA: Diagnosis not present

## 2022-11-20 DIAGNOSIS — F3341 Major depressive disorder, recurrent, in partial remission: Secondary | ICD-10-CM

## 2022-11-20 MED ORDER — OLANZAPINE 10 MG PO TABS: 10.0000 mg | ORAL_TABLET | Freq: Every day | ORAL | 1 refills | Status: DC

## 2022-11-20 MED ORDER — OLANZAPINE 5 MG PO TABS: 5.0000 mg | ORAL_TABLET | Freq: Every day | ORAL | 0 refills | Status: DC

## 2022-11-20 NOTE — Patient Instructions (Addendum)
Continue olanzapine 5 mg in the a.m., 10 mg at night  Continue Paxil 20 mg daily  Next appointment: 11/11 at 11 AM

## 2022-11-22 ENCOUNTER — Other Ambulatory Visit: Payer: Self-pay

## 2022-11-22 ENCOUNTER — Encounter: Payer: Self-pay | Admitting: Oncology

## 2022-11-22 ENCOUNTER — Emergency Department
Admission: EM | Admit: 2022-11-22 | Discharge: 2022-11-22 | Disposition: A | Discharge status: Home / Self Care | Payer: Medicare Other | Attending: Emergency Medicine | Admitting: Emergency Medicine

## 2022-11-22 DIAGNOSIS — N390 Urinary tract infection, site not specified: Secondary | ICD-10-CM

## 2022-11-22 DIAGNOSIS — E119 Type 2 diabetes mellitus without complications: Secondary | ICD-10-CM | POA: Insufficient documentation

## 2022-11-22 DIAGNOSIS — R3981 Functional urinary incontinence: Secondary | ICD-10-CM | POA: Diagnosis present

## 2022-11-22 DIAGNOSIS — Z1152 Encounter for screening for COVID-19: Secondary | ICD-10-CM | POA: Diagnosis not present

## 2022-11-22 LAB — BASIC METABOLIC PANEL
Anion gap: 9 (ref 5–15)
BUN: 12 mg/dL (ref 8–23)
CO2: 27 mmol/L (ref 22–32)
Calcium: 8.4 mg/dL — ABNORMAL LOW (ref 8.9–10.3)
Chloride: 100 mmol/L (ref 98–111)
Creatinine, Ser: 0.42 mg/dL — ABNORMAL LOW (ref 0.44–1.00)
GFR, Estimated: >60 mL/min (ref >60)
Glucose, Bld: 97 mg/dL (ref 70–99)
Potassium: 3.9 mmol/L (ref 3.5–5.1)
Sodium: 136 mmol/L (ref 135–145)

## 2022-11-22 LAB — URINALYSIS, ROUTINE W REFLEX MICROSCOPIC
Bilirubin Urine: NEGATIVE
Glucose, UA: NEGATIVE mg/dL
Ketones, ur: NEGATIVE mg/dL
Nitrite: POSITIVE — AB
Protein, ur: 30 mg/dL — AB
Specific Gravity, Urine: 1.008 (ref 1.005–1.030)
Squamous Epithelial / HPF: NONE SEEN /HPF (ref 0–5)
WBC, UA: >50 WBC/hpf (ref 0–5)
pH: 9 — ABNORMAL HIGH (ref 5.0–8.0)

## 2022-11-22 LAB — CBC
HCT: 29.2 % — ABNORMAL LOW (ref 36.0–46.0)
Hemoglobin: 8.6 g/dL — ABNORMAL LOW (ref 12.0–15.0)
MCH: 27 pg (ref 26.0–34.0)
MCHC: 29.5 g/dL — ABNORMAL LOW (ref 30.0–36.0)
MCV: 91.5 fL (ref 80.0–100.0)
Platelets: 472 10*3/uL — ABNORMAL HIGH (ref 150–400)
RBC: 3.19 MIL/uL — ABNORMAL LOW (ref 3.87–5.11)
RDW: 15.1 % (ref 11.5–15.5)
WBC: 7.1 10*3/uL (ref 4.0–10.5)
nRBC: 0 % (ref 0.0–0.2)

## 2022-11-22 LAB — SARS CORONAVIRUS 2 BY RT PCR: SARS Coronavirus 2 by RT PCR: NEGATIVE

## 2022-11-22 MED ORDER — NITROFURANTOIN MONOHYD MACRO 100 MG PO CAPS: 100.0000 mg | ORAL_CAPSULE | Freq: Two times a day (BID) | ORAL | 0 refills | Status: AC

## 2022-11-22 MED ORDER — NITROFURANTOIN MONOHYD MACRO 100 MG PO CAPS
100.0000 mg | ORAL_CAPSULE | Freq: Once | ORAL | Status: AC
  Administered 2022-11-22: 100 mg via ORAL
  Filled 2022-11-22: qty 1

## 2022-11-22 NOTE — Discharge Instructions (Addendum)
Your blood test today were reassuring, your vital signs were reassuring, your urinalysis is consistent with a urinary tract infection, please take the full course of antibiotics

## 2022-11-22 NOTE — ED Notes (Signed)
Family updated as to patient's status.This RN spoke with Cheri Rous, patient's father, to let him know that pt was being discharged and antibiotics had been sent to pharmacy.

## 2022-11-22 NOTE — ED Provider Notes (Signed)
[facility] Provider Note    Event Date/Time   First MD Initiated Contact with Patient 11/22/22 [phone]     (approximate)   History   Possible urinary tract infection   HPI  Michele James is a 67 y.o. female with a history of diabetes, cerebral palsy, schizophrenia presents for evaluation.  Patient reports that she has been having discomfort with urination.  Last night she had an episode of urinary incontinence.  She denies abdominal pain.  No fevers reported.  Review of records demonstrates she called her primary care office yesterday regarding dysuria.     Physical Exam   Triage Vital Signs: ED Triage Vitals  Encounter Vitals Group     BP      Systolic BP Percentile      Diastolic BP Percentile      Pulse      Resp      Temp      Temp src      SpO2      Weight      Height      Head Circumference      Peak Flow      Pain Score      Pain Loc      Pain Education      Exclude from Growth Chart     Most recent vital signs: Vitals:   11/22/22 0930 11/22/22 1000  BP: 137/69 115/60  Pulse: 83 84  Resp: 19 18  Temp:    SpO2: 95% 98%     General: Awake, no distress.  Overall well-appearing CV:  Good peripheral perfusion.  Resp:  Normal effort.  Abd:  No distention.  Soft, nontender, no CVA tenderness Other:     ED Results / Procedures / Treatments   Labs (all labs ordered are listed, but only abnormal results are displayed) Labs Reviewed  CBC - Abnormal; Notable for the following components:      Result Value   RBC 3.19 (*)    Hemoglobin 8.6 (*)    HCT 29.2 (*)    MCHC 29.5 (*)    Platelets 472 (*)    All other components within normal limits  BASIC METABOLIC PANEL - Abnormal; Notable for the following components:   Creatinine, Ser 0.42 (*)    Calcium 8.4 (*)    All other components within normal limits  URINALYSIS, ROUTINE W REFLEX MICROSCOPIC - Abnormal; Notable for the following components:   Color, Urine YELLOW (*)     APPearance CLOUDY (*)    pH 9.0 (*)    Hgb urine dipstick MODERATE (*)    Protein, ur 30 (*)    Nitrite POSITIVE (*)    Leukocytes,Ua LARGE (*)    Bacteria, UA RARE (*)    All other components within normal limits  SARS CORONAVIRUS 2 BY RT PCR     EKG     RADIOLOGY     PROCEDURES:  Critical Care performed:   Procedures   MEDICATIONS ORDERED IN ED: Medications  nitrofurantoin (macrocrystal-monohydrate) (MACROBID) capsule 100 mg (has no administration in time range)     IMPRESSION / MDM / ASSESSMENT AND PLAN / ED COURSE  I reviewed the triage vital signs and the nursing notes. Patient's presentation is most consistent with acute illness / injury with system symptoms.  Patient presents with urinary incontinence, dysuria in the setting of diabetes.  Review of records demonstrates admission for sepsis secondary to pneumonia 1 month ago.  Today her  primary complaint is the incontinence and dysuria, certainly suspicious for urinary tract infection.  Overall well-appearing vital signs reassuring, doubt sepsis will check basic labs, urinalysis  Urinalysis is consistent with UTI, lab work is reassuring, patient well-appearing appears to be at her baseline, will treat with antibiotics, appropriate discharge at this time, prescription provided, return precautions discussed      FINAL CLINICAL IMPRESSION(S) / ED DIAGNOSES   Final diagnoses:  Lower urinary tract infectious disease     Rx / DC Orders   ED Discharge Orders          Ordered    nitrofurantoin, macrocrystal-monohydrate, (MACROBID) 100 MG capsule  2 times daily        11/22/22 1022             Note:  This document was prepared using Dragon voice recognition software and may include unintentional dictation errors.   Jene Every, MD 11/22/22 1026

## 2022-11-22 NOTE — ED Triage Notes (Signed)
Pt brought in by EMS after being called by caregiver for altered mental status and visual hallucinations x 2 weeks. Per EMS, pt alert and oriented x 3-4 en route in to hospital.Upon arrival to ED,  Pt complains of UTI; states she wet the bed last night. She is also concerned that she may have COVID; states the lady at the dentist who cleaned her teeth had covid.

## 2022-12-03 ENCOUNTER — Other Ambulatory Visit: Payer: Self-pay | Admitting: Psychiatry

## 2022-12-11 ENCOUNTER — Other Ambulatory Visit: Payer: Self-pay | Admitting: Psychiatry

## 2022-12-12 ENCOUNTER — Other Ambulatory Visit: Payer: Self-pay | Admitting: Psychiatry

## 2022-12-13 ENCOUNTER — Other Ambulatory Visit: Payer: Self-pay | Admitting: Psychiatry

## 2022-12-15 ENCOUNTER — Other Ambulatory Visit: Payer: Self-pay | Admitting: Psychiatry

## 2022-12-15 NOTE — Telephone Encounter (Signed)
ordered

## 2022-12-15 NOTE — Telephone Encounter (Signed)
I've been receiving multiple refill requests from this pharmacy. Could you verify with them that they have both olanzapine and paroxetine refills to last until her next visit? Thanks.

## 2022-12-15 NOTE — Telephone Encounter (Signed)
pt husband called and wanted to know why you stopped Michele James medication. he was told that no medication had been stopped and he states that the pharmacy has sent two request for her medication and she keeps reget the request. he states pharmacy does not have rx

## 2022-12-15 NOTE — Telephone Encounter (Signed)
called pharmacy they they confirmed they DID Not Received the olanzapine 10mg  , did not received the olanzapine 5mg  and they did not received the paroxetine 20mg .   Please resend today

## 2022-12-17 NOTE — Telephone Encounter (Signed)
Called pharmacy as instructed by the provider spoke to [facility] she stated that the patient picked up the paroxetine and the olanzapine on 12/16/22 90 day supply for both medications

## 2023-01-01 ENCOUNTER — Other Ambulatory Visit: Payer: Self-pay | Admitting: Psychiatry

## 2023-01-08 ENCOUNTER — Ambulatory Visit
Admission: RE | Admit: 2023-01-08 | Discharge: 2023-01-08 | Disposition: A | Discharge status: Home / Self Care | Payer: Medicare Other | Source: Ambulatory Visit | Attending: Family Medicine | Admitting: Family Medicine

## 2023-01-08 DIAGNOSIS — Z1231 Encounter for screening mammogram for malignant neoplasm of breast: Secondary | ICD-10-CM | POA: Diagnosis present

## 2023-01-14 ENCOUNTER — Inpatient Hospital Stay: Payer: Medicare Other | Attending: Oncology

## 2023-01-14 DIAGNOSIS — D500 Iron deficiency anemia secondary to blood loss (chronic): Secondary | ICD-10-CM | POA: Insufficient documentation

## 2023-01-14 LAB — CBC WITH DIFFERENTIAL (CANCER CENTER ONLY)
Abs Immature Granulocytes: 0.1 10*3/uL — ABNORMAL HIGH (ref 0.00–0.07)
Basophils Absolute: 0.1 10*3/uL (ref 0.0–0.1)
Basophils Relative: 1 %
Eosinophils Absolute: 0.5 10*3/uL (ref 0.0–0.5)
Eosinophils Relative: 6 %
HCT: 27.6 % — ABNORMAL LOW (ref 36.0–46.0)
Hemoglobin: 8.1 g/dL — ABNORMAL LOW (ref 12.0–15.0)
Immature Granulocytes: 1 %
Lymphocytes Relative: 21 %
Lymphs Abs: 1.6 10*3/uL (ref 0.7–4.0)
MCH: 24.7 pg — ABNORMAL LOW (ref 26.0–34.0)
MCHC: 29.3 g/dL — ABNORMAL LOW (ref 30.0–36.0)
MCV: 84.1 fL (ref 80.0–100.0)
Monocytes Absolute: 0.9 10*3/uL (ref 0.1–1.0)
Monocytes Relative: 13 %
Neutro Abs: 4.3 10*3/uL (ref 1.7–7.7)
Neutrophils Relative %: 58 %
Platelet Count: 471 10*3/uL — ABNORMAL HIGH (ref 150–400)
RBC: 3.28 MIL/uL — ABNORMAL LOW (ref 3.87–5.11)
RDW: 17 % — ABNORMAL HIGH (ref 11.5–15.5)
WBC Count: 7.4 10*3/uL (ref 4.0–10.5)
nRBC: 0 % (ref 0.0–0.2)

## 2023-01-14 LAB — RETIC PANEL
Immature Retic Fract: 27.1 % — ABNORMAL HIGH (ref 2.3–15.9)
RBC.: 3.3 MIL/uL — ABNORMAL LOW (ref 3.87–5.11)
Retic Count, Absolute: 65.7 10*3/uL (ref 19.0–186.0)
Retic Ct Pct: 2 % (ref 0.4–3.1)
Reticulocyte Hemoglobin: 24 pg — ABNORMAL LOW (ref >27.9)

## 2023-01-14 LAB — FERRITIN: Ferritin: 29 ng/mL (ref 11–307)

## 2023-01-14 LAB — IRON AND TIBC
Iron: 22 ug/dL — ABNORMAL LOW (ref 28–170)
Saturation Ratios: 6 % — ABNORMAL LOW (ref 10.4–31.8)
TIBC: 353 ug/dL (ref 250–450)
UIBC: 331 ug/dL

## 2023-01-21 ENCOUNTER — Inpatient Hospital Stay: Payer: Medicare Other

## 2023-01-21 ENCOUNTER — Inpatient Hospital Stay (HOSPITAL_BASED_OUTPATIENT_CLINIC_OR_DEPARTMENT_OTHER): Payer: Medicare Other | Admitting: Oncology

## 2023-01-21 ENCOUNTER — Encounter: Payer: Self-pay | Admitting: Oncology

## 2023-01-21 VITALS — BP 86/57 | HR 89 | Temp 98.8°F

## 2023-01-21 VITALS — BP 114/59 | HR 85 | Temp 98.3°F

## 2023-01-21 DIAGNOSIS — D500 Iron deficiency anemia secondary to blood loss (chronic): Secondary | ICD-10-CM

## 2023-01-21 MED ORDER — SODIUM CHLORIDE 0.9% FLUSH
10.0000 mL | Freq: Once | INTRAVENOUS | Status: AC | PRN
Start: 2023-01-21 — End: 2023-01-21
  Administered 2023-01-21: 10 mL
  Filled 2023-01-21: qty 10

## 2023-01-21 MED ORDER — SODIUM CHLORIDE 0.9 % IV SOLN
200.0000 mg | Freq: Once | INTRAVENOUS | Status: DC
Start: 2023-01-21 — End: 2023-01-21
  Filled 2023-01-21: qty 10

## 2023-01-21 MED ORDER — IRON SUCROSE 20 MG/ML IV SOLN
200.0000 mg | Freq: Once | INTRAVENOUS | Status: AC
  Administered 2023-01-21: 200 mg via INTRAVENOUS
  Filled 2023-01-21: qty 10

## 2023-01-21 NOTE — Progress Notes (Signed)
Hematology/Oncology Progress note [phone] [phone]        CHIEF COMPLAINTS/REASON FOR VISIT:  IDA  ASSESSMENT & PLAN:   Iron deficiency anemia due to chronic blood loss Lab Results  Component Value Date   HGB 8.1 (L) 01/14/2023   TIBC 353 01/14/2023   IRONPCTSAT 6 (L) 01/14/2023   FERRITIN 29 01/14/2023    IV Venofer weekly x 4 Recommend patient to follow up with GI for evaluation of blood loss  Orders Placed This Encounter  Procedures   CBC with Differential (Cancer Center Only)    Standing Status:   Future    Standing Expiration Date:   01/21/2024   Iron and TIBC    Standing Status:   Future    Standing Expiration Date:   01/21/2024   Ferritin    Standing Status:   Future    Standing Expiration Date:   01/21/2024   Follow-up 6 months.  All questions were answered. The patient knows to call the clinic with any problems, questions or concerns.  Rickard Patience, MD, PhD [facility] Hematology Oncology 01/21/2023   HISTORY OF PRESENTING ILLNESS:   Patient has a history of seizure disorder/history of cerebral palsy Patient is a poor historian.  She was accompanied by her mother who has hearing deficiency and not able to provide much history.Marland Kitchen   History was mainly obtained from reviewing medical records.  06/14/2020, CT chest with contrast showed a lobulated nodule with mildly spiculated margin in the posterior right chest persist and is suspicious for bronchogenic neoplasm based on morphologic repeat years Nodule along the minor fissure in the right chest measures 6 mm, nonspecific.  Moderately large hiatal hernia 11 mm distal common bile duct, previously 8 to 9 mm.  No pancreatic duct dilatation is visualized.  Prominence of the common bile duct on the prior study in this patient's post cholecystectomy.  Aortic atherosclerosis.  Patient was referred to establish care with oncology for further evaluation of the lung nodule.  She denies any cough,  shortness of breath, hemoptysis, patient is a former smoker, and quit in 1982.  She reports only smoked for 4 months and mostly on weekends.  Patient has contractures and physical deformities which severely limits her  mobility.  She lives at home with parents.   Lung nodule  07/02/2020 no activity on PET scan. 01/04/2021, CT chest without contrast showed stable size of the lung nodule.  Likely benign. 01/07/22 CT chest without contrast showed showed stable/slightly smaller 1.4 cm posterior right lower lobe pulmonary nodule.  Likely benign.  New right upper lobe groundglass nodule with patchy groundglass opacity.   07/17/2022 CT chest wo contrast showed  1. No suspicious pulmonary nodules. Previously demonstrated nodules are stable to decreased in size, consistent with benign findings. 2. Patchy ground-glass opacities in both lungs have mildly progressed posteriorly in the right upper lobe, but are non focal  and likely inflammatory/postinflammatory. 3. Moderate to large hiatal hernia, mildly increased in size from previous imaging. 4. No adenopathy or pleural effusion. 5. Aortic Atherosclerosis (ICD10-I70.0) and Emphysema   04/13/22 EGD showed gastritis, 04/21/22 colonoscopy negative. Small bowel enteroscopy negative.   INTERVAL HISTORY KEMIYA RAMSBURG is a 67 y.o. female who has above history reviewed by me today presents for acute visit for anemia   Today she was accompanied by her partner. She is a poor historian.  She reports taking oral iron supplementation. Denies blood in stool, abdominal pain, weight loss    Review of Systems  Unable to perform ROS: Other  Constitutional:  Positive for appetite change. Negative for fatigue and unexpected weight change.  Respiratory:  Negative for cough and shortness of breath.   Cardiovascular:  Negative for chest pain.  Gastrointestinal:  Negative for abdominal pain.  Musculoskeletal:  Negative for back pain.  Hematological:  Does not bruise/bleed  easily.  Psychiatric/Behavioral:  Negative for confusion.     MEDICAL HISTORY:  Past Medical History:  Diagnosis Date   Anemia    Arthritis    knees   Cerebral palsy (HCC)    Depression    Diabetes mellitus without complication (HCC)    Diverticulosis    Dysphagia    Eczema    GERD (gastroesophageal reflux disease)    Glaucoma    Hemorrhoids    Hyperlipidemia    Hypothyroidism    Seizures (HCC)    none in over 10 yrs    SURGICAL HISTORY: Past Surgical History:  Procedure Laterality Date   BREAST BIOPSY Right 03/22/2020   stereo bx, ribbon clip,  FAT NECROSIS AND FIBROSIS   CATARACT EXTRACTION W/PHACO Right 02/06/2016   Procedure: CATARACT EXTRACTION PHACO AND INTRAOCULAR LENS PLACEMENT (IOC);  Surgeon: Lockie Mola, MD;  Location: [facility];  Service: Ophthalmology;  Laterality: Right;   CATARACT EXTRACTION W/PHACO Left 03/05/2016   Procedure: CATARACT EXTRACTION PHACO AND INTRAOCULAR LENS PLACEMENT (IOC);  Surgeon: Lockie Mola, MD;  Location: [facility];  Service: Ophthalmology;  Laterality: Left;   CHOLECYSTECTOMY N/A 11/16/2015   Procedure: LAPAROSCOPIC CHOLECYSTECTOMY;  Surgeon: Tiney Rouge III, MD;  Location: [facility] ORS;  Service: General;  Laterality: N/A;  attempted cholangiogram   COLONOSCOPY WITH PROPOFOL N/A 12/25/2014   Procedure: COLONOSCOPY WITH PROPOFOL;  Surgeon: Wallace Cullens, MD;  Location: [facility] ENDOSCOPY;  Service: Gastroenterology;  Laterality: N/A;   COLONOSCOPY WITH PROPOFOL N/A 04/14/2019   Procedure: COLONOSCOPY WITH PROPOFOL;  Surgeon: Toledo, Boykin Nearing, MD;  Location: [facility] ENDOSCOPY;  Service: Gastroenterology;  Laterality: N/A;   COLONOSCOPY WITH PROPOFOL N/A 04/20/2022   Procedure: COLONOSCOPY WITH PROPOFOL;  Surgeon: Wyline Mood, MD;  Location: [facility] ENDOSCOPY;  Service: Gastroenterology;  Laterality: N/A;   ENDOSCOPIC RETROGRADE CHOLANGIOPANCREATOGRAPHY (ERCP) WITH PROPOFOL N/A 11/18/2015   Procedure: ENDOSCOPIC  RETROGRADE CHOLANGIOPANCREATOGRAPHY (ERCP) WITH PROPOFOL;  Surgeon: Midge Minium, MD;  Location: [facility] ENDOSCOPY;  Service: Endoscopy;  Laterality: N/A;   ESOPHAGOGASTRODUODENOSCOPY (EGD) WITH PROPOFOL N/A 04/14/2019   Procedure: ESOPHAGOGASTRODUODENOSCOPY (EGD) WITH PROPOFOL;  Surgeon: Toledo, Boykin Nearing, MD;  Location: [facility] ENDOSCOPY;  Service: Gastroenterology;  Laterality: N/A;   ESOPHAGOGASTRODUODENOSCOPY (EGD) WITH PROPOFOL N/A 04/20/2022   Procedure: ESOPHAGOGASTRODUODENOSCOPY (EGD) WITH PROPOFOL;  Surgeon: Wyline Mood, MD;  Location: [facility] ENDOSCOPY;  Service: Gastroenterology;  Laterality: N/A;   EYE SURGERY     Cataract Surgery    HERNIA REPAIR      SOCIAL HISTORY: Social History   Socioeconomic History   Marital status: Single    Spouse name: Not on file   Number of children: Not on file   Years of education: Not on file   Highest education level: Not on file  Occupational History   Not on file  Tobacco Use   Smoking status: Former    Current packs/day: 0.00    Types: Cigarettes    Quit date: 06/20/1980    Years since quitting: 42.6   Smokeless tobacco: Never   Tobacco comments:    smoked socially in early 1980s (reports for 4 months)  Vaping Use   Vaping status: Never Used  Substance and  Sexual Activity   Alcohol use: No   Drug use: No   Sexual activity: Not Currently  Other Topics Concern   Not on file  Social History Narrative   Not on file   Social Determinants of Health   Financial Resource Strain: Low Risk  (10/01/2022)   Received from [facility], [facility]   Overall Financial Resource Strain (CARDIA)    Difficulty of Paying Living Expenses: Not hard at all  Food Insecurity: No Food Insecurity (10/01/2022)   Received from [facility], [facility]   Hunger Vital Sign    Worried About Running Out of Food in the Last Year: Never true    Ran Out of Food in the Last Year: Never true   Transportation Needs: No Transportation Needs (10/01/2022)   Received from [facility], [facility]   [facility] - Transportation    In the past 12 months, has lack of transportation kept you from medical appointments or from getting medications?: No    Lack of Transportation (Non-Medical): No  Physical Activity: Not on file  Stress: Not on file  Social Connections: Not on file  Intimate Partner Violence: Not At Risk (04/18/2022)   Humiliation, Afraid, Rape, and Kick questionnaire    Fear of Current or Ex-Partner: No    Emotionally Abused: No    Physically Abused: No    Sexually Abused: No    FAMILY HISTORY: Family History  Problem Relation Age of Onset   High blood pressure Mother    Hyperlipidemia Mother    Diabetes Mellitus II Mother    Stroke Mother    Osteoporosis Mother    High blood pressure Father    Breast cancer Maternal Aunt    Stroke Maternal Grandfather    Diabetes Mellitus II Maternal Grandfather    Stroke Maternal Grandmother    Diabetes Mellitus II Maternal Grandmother     ALLERGIES:  is allergic to aminophylline, cinnamon, amoxicillin, latex, nsaids, penicillins, potassium, potassium-containing compounds, septra [sulfamethoxazole-trimethoprim], sulfa antibiotics, sulfasalazine, tape, and theophyllines.  MEDICATIONS:  Current Outpatient Medications  Medication Sig Dispense Refill   acetaminophen (TYLENOL) 325 MG tablet Take 325 mg by mouth every 6 (six) hours as needed.     alendronate (FOSAMAX) 70 MG tablet Take 70 mg by mouth once a week. Take with a full glass of water on an empty stomach.     ALPRAZolam (XANAX) 0.25 MG tablet Take 0.25 mg by mouth as needed for anxiety. Before pap smears     atorvastatin (LIPITOR) 40 MG tablet Take 40 mg by mouth daily.     calcium citrate-vitamin D (CITRACAL+D) 315-200 MG-UNIT per tablet Take 1 tablet by mouth 2 (two) times daily.     carbamazepine (TEGRETOL) 200 MG tablet Take 200 mg by  mouth 3 (three) times daily.     Cholecalciferol 100 MCG (4000 UT) TABS Take 1,000 Units by mouth.     esomeprazole (NEXIUM) 40 MG capsule Take 40 mg by mouth daily at 12 noon.     famotidine (PEPCID) 20 MG tablet Take 20 mg by mouth 2 (two) times daily.     hydroxypropyl methylcellulose / hypromellose (ISOPTO TEARS / GONIOVISC) 2.5 % ophthalmic solution 1 drop as needed for dry eyes.     levothyroxine (SYNTHROID) 100 MCG tablet Take 100 mcg by mouth every morning.     magnesium oxide (MAG-OX) 400 MG tablet Take 1 tablet by mouth 3 (three) times daily.  naproxen (NAPROSYN) 375 MG tablet Take 375 mg by mouth 2 (two) times daily with a meal.     OLANZapine (ZYPREXA) 10 MG tablet Take 1 tablet (10 mg total) by mouth at bedtime. 90 tablet 1   OLANZapine (ZYPREXA) 5 MG tablet Take 1 tablet (5 mg total) by mouth daily. 90 tablet 0   PARoxetine (PAXIL) 20 MG tablet Take 1 tablet (20 mg total) by mouth daily. 90 tablet 1   potassium chloride 20 MEQ/15ML (10%) SOLN Take 10 mEq by mouth daily.     vitamin E 400 UNIT capsule Take 400 Units by mouth daily.     iron polysaccharides (NIFEREX) 150 MG capsule Take 1 capsule (150 mg total) by mouth 2 (two) times daily for 30 days, THEN 1 capsule (150 mg total) daily. 150 capsule 0   No current facility-administered medications for this visit.     PHYSICAL EXAMINATION: ECOG PERFORMANCE STATUS: 2 - Symptomatic, <50% confined to bed Vitals:   01/21/23 1323  BP: (!) 86/57  Pulse: 89  Temp: 98.8 F (37.1 C)   Filed Weights     Physical Exam Constitutional:      General: She is not in acute distress.    Comments: Patient sits in the wheelchair  HENT:     Head: Normocephalic and atraumatic.  Eyes:     General: No scleral icterus. Cardiovascular:     Rate and Rhythm: Normal rate and regular rhythm.     Heart sounds: Normal heart sounds.  Pulmonary:     Effort: Pulmonary effort is normal. No respiratory distress.     Breath sounds: No wheezing.   Abdominal:     General: Bowel sounds are normal. There is no distension.     Palpations: Abdomen is soft.  Musculoskeletal:        General: Deformity present.     Cervical back: Normal range of motion and neck supple.     Comments: Contracture of left upper extremity in the left lower extremity.  Skin:    General: Skin is warm and dry.     Coloration: Skin is pale.     Findings: No erythema or rash.  Neurological:     Mental Status: She is alert. Mental status is at baseline.     LABORATORY DATA:  I have reviewed the data as listed    Latest Ref Rng & Units 01/14/2023   10:22 AM 11/22/2022    7:48 AM 10/22/2022    7:02 AM  CBC  WBC 4.0 - 10.5 K/uL 7.4  7.1  6.9   Hemoglobin 12.0 - 15.0 g/dL 8.1  8.6  8.2   Hematocrit 36.0 - 46.0 % 27.6  29.2  24.9   Platelets 150 - 400 K/uL 471  472  288       Latest Ref Rng & Units 11/22/2022    7:48 AM 10/22/2022    7:02 AM 10/19/2022    3:40 AM  CMP  Glucose 70 - 99 mg/dL 97  865  784   BUN 8 - 23 mg/dL 12  8  11    Creatinine 0.44 - 1.00 mg/dL 6.96  2.95  2.84   Sodium 135 - 145 mmol/L 136  137  137   Potassium 3.5 - 5.1 mmol/L 3.9  4.1  3.8   Chloride 98 - 111 mmol/L 100  104  105   CO2 22 - 32 mmol/L 27  26  26    Calcium 8.9 - 10.3 mg/dL 8.4  8.1  8.1       RADIOGRAPHIC STUDIES: I have personally reviewed the radiological images as listed and agreed with the findings in the report. MM 3D SCREENING MAMMOGRAM BILATERAL BREAST  Result Date: 01/12/2023 CLINICAL DATA:  Screening. EXAM: DIGITAL SCREENING BILATERAL MAMMOGRAM WITH TOMOSYNTHESIS AND CAD TECHNIQUE: Bilateral screening digital craniocaudal and mediolateral oblique mammograms were obtained. Bilateral screening digital breast tomosynthesis was performed. The images were evaluated with computer-aided detection. COMPARISON:  Previous exam(s). ACR Breast Density Category b: There are scattered areas of fibroglandular density. FINDINGS: There are no findings suspicious for  malignancy. IMPRESSION: No mammographic evidence of malignancy. A result letter of this screening mammogram will be mailed directly to the patient. RECOMMENDATION: Screening mammogram in one year. (Code:SM-B-01Y) BI-RADS CATEGORY  1: Negative. Electronically Signed   By: Amie Portland M.D.   On: 01/12/2023 15:24

## 2023-01-21 NOTE — Progress Notes (Signed)
Pt here for follow up. Pt's husband reports that pt has been having increased confusion.

## 2023-01-21 NOTE — Assessment & Plan Note (Addendum)
Lab Results  Component Value Date   HGB 8.1 (L) 01/14/2023   TIBC 353 01/14/2023   IRONPCTSAT 6 (L) 01/14/2023   FERRITIN 29 01/14/2023    IV Venofer weekly x 4 Recommend patient to follow up with GI for evaluation of blood loss

## 2023-01-28 ENCOUNTER — Inpatient Hospital Stay: Payer: Medicare Other

## 2023-01-29 ENCOUNTER — Inpatient Hospital Stay: Payer: Medicare Other

## 2023-01-29 VITALS — BP 125/67 | HR 93 | Temp 100.0°F | Resp 18

## 2023-01-29 DIAGNOSIS — D500 Iron deficiency anemia secondary to blood loss (chronic): Secondary | ICD-10-CM

## 2023-01-29 MED ORDER — SODIUM CHLORIDE 0.9% FLUSH
10.0000 mL | Freq: Once | INTRAVENOUS | Status: AC | PRN
Start: 2023-01-29 — End: 2023-01-29
  Administered 2023-01-29: 10 mL
  Filled 2023-01-29: qty 10

## 2023-01-29 MED ORDER — IRON SUCROSE 20 MG/ML IV SOLN
200.0000 mg | Freq: Once | INTRAVENOUS | Status: AC
  Administered 2023-01-29: 200 mg via INTRAVENOUS
  Filled 2023-01-29: qty 10

## 2023-02-04 ENCOUNTER — Inpatient Hospital Stay: Payer: Medicare Other | Attending: Oncology

## 2023-02-04 VITALS — BP 113/54 | HR 89 | Temp 99.2°F | Resp 18

## 2023-02-04 DIAGNOSIS — Z23 Encounter for immunization: Secondary | ICD-10-CM | POA: Insufficient documentation

## 2023-02-04 DIAGNOSIS — D500 Iron deficiency anemia secondary to blood loss (chronic): Secondary | ICD-10-CM | POA: Insufficient documentation

## 2023-02-04 MED ORDER — INFLUENZA VAC A&B SURF ANT ADJ 0.5 ML IM SUSY
0.5000 mL | PREFILLED_SYRINGE | Freq: Once | INTRAMUSCULAR | Status: AC
  Administered 2023-02-04: 0.5 mL via INTRAMUSCULAR
  Filled 2023-02-04: qty 0.5

## 2023-02-04 MED ORDER — IRON SUCROSE 20 MG/ML IV SOLN
200.0000 mg | Freq: Once | INTRAVENOUS | Status: AC
  Administered 2023-02-04: 200 mg via INTRAVENOUS

## 2023-02-04 NOTE — Progress Notes (Deleted)
BH MD/PA/NP OP Progress Note  02/04/2023 8:51 AM FERNANDE TREIBER  MRN:  [id number]  Chief Complaint: No chief complaint on file.  HPI:  - According to the chart review, the following events have occurred since the last visit: The patient was seen at ED for dysuria. Treated for UTI. - she was seen by Dr. Cathie Hoops for evaluation of lung nodule, iron deficiency anemia. Recommended IV Venofer weekly x 4, and follow up with GI.  Colonoscopy 1/24- Dr Tobi Bastos- bad prep, no gross lesions Upper enterscopy- Dr Tobi Bastos- normal exam. Biopsies of small intestine negative  EGD 04/14/19- Dr Norma Fredrickson- Benign esophageal stenosis, gastritis, duodenal diverticulum. There was no H pylori, Barretts, dysplasia/malignancy.  - she was seen by neurology. Scheduled for routine, and 3 hour EEG.   Visit Diagnosis: No diagnosis found.  Past Psychiatric History: Please see initial evaluation for full details. I have reviewed the history. No updates at this time.     Past Medical History:  Past Medical History:  Diagnosis Date   Anemia    Arthritis    knees   Cerebral palsy (HCC)    Depression    Diabetes mellitus without complication (HCC)    Diverticulosis    Dysphagia    Eczema    GERD (gastroesophageal reflux disease)    Glaucoma    Hemorrhoids    Hyperlipidemia    Hypothyroidism    Seizures (HCC)    none in over 10 yrs    Past Surgical History:  Procedure Laterality Date   BREAST BIOPSY Right 03/22/2020   stereo bx, ribbon clip,  FAT NECROSIS AND FIBROSIS   CATARACT EXTRACTION W/PHACO Right 02/06/2016   Procedure: CATARACT EXTRACTION PHACO AND INTRAOCULAR LENS PLACEMENT (IOC);  Surgeon: Lockie Mola, MD;  Location: [facility];  Service: Ophthalmology;  Laterality: Right;   CATARACT EXTRACTION W/PHACO Left 03/05/2016   Procedure: CATARACT EXTRACTION PHACO AND INTRAOCULAR LENS PLACEMENT (IOC);  Surgeon: Lockie Mola, MD;  Location: [facility];  Service: Ophthalmology;  Laterality:  Left;   CHOLECYSTECTOMY N/A 11/16/2015   Procedure: LAPAROSCOPIC CHOLECYSTECTOMY;  Surgeon: Tiney Rouge III, MD;  Location: [facility];  Service: General;  Laterality: N/A;  attempted cholangiogram   COLONOSCOPY WITH PROPOFOL N/A 12/25/2014   Procedure: COLONOSCOPY WITH PROPOFOL;  Surgeon: Wallace Cullens, MD;  Location: [facility];  Service: Gastroenterology;  Laterality: N/A;   COLONOSCOPY WITH PROPOFOL N/A 04/14/2019   Procedure: COLONOSCOPY WITH PROPOFOL;  Surgeon: Toledo, Boykin Nearing, MD;  Location: [facility] END[facility];  Service: Gastroenterology;  Laterality: N/A;   COLONOSCOPY WITH PROPOFOL N/A 04/20/2022   Procedure: COLONOSCOPY WITH PROPOFOL;  Surgeon: Wyline Mood, MD;  Location: [facility] END[facility];  Service: Gastroenterology;  Laterality: N/A;   ENDOSCOPIC RETROGRADE CHOLANGIOPANCREATOGRAPHY (ERCP) WITH PROPOFOL N/A 11/18/2015   Procedure: ENDOSCOPIC RETROGRADE CHOLANGIOPANCREATOGRAPHY (ERCP) WITH PROPOFOL;  Surgeon: Midge Minium, MD;  Location: [facility] ENDOSCOPY;  Service: Endoscopy;  Laterality: N/A;   ESOPHAGOGASTRODUODENOSCOPY (EGD) WITH PROPOFOL N/A 04/14/2019   Procedure: ESOPHAGOGASTRODUODENOSCOPY (EGD) WITH PROPOFOL;  Surgeon: Toledo, Boykin Nearing, MD;  Location: [facility] END[facility]COPY;  Service: Gastroenterology;  Laterality: N/A;   ESOPHAGOGASTRODUODENOSCOPY (EGD) WITH PROPOFOL N/A 04/20/2022   Procedure: ESOPHAGOGASTRODUODENOSCOPY (EGD) WITH PROPOFOL;  Surgeon: Wyline Mood, MD;  Location: [facility] END[facility];  Service: Gastroenterology;  Laterality: N/A;   EYE SURGERY     Cataract Surgery    HERNIA REPAIR      Family Psychiatric History: Please see initial evaluation for full details. I have reviewed the history. No updates at this time.  Family History:  Family History  Problem Relation Age of Onset   High blood pressure Mother    Hyperlipidemia Mother    Diabetes Mellitus II Mother    Stroke Mother    Osteoporosis Mother    High blood pressure Father    Breast cancer Maternal Aunt    Stroke  Maternal Grandfather    Diabetes Mellitus II Maternal Grandfather    Stroke Maternal Grandmother    Diabetes Mellitus II Maternal Grandmother     Social History:  Social History   Socioeconomic History   Marital status: Single    Spouse name: Not on file   Number of children: Not on file   Years of education: Not on file   Highest education level: Not on file  Occupational History   Not on file  Tobacco Use   Smoking status: Former    Current packs/day: 0.00    Types: Cigarettes    Quit date: 06/20/1980    Years since quitting: 42.6   Smokeless tobacco: Never   Tobacco comments:    smoked socially in early 1980s (reports for 4 months)  Vaping Use   Vaping status: Never Used  Substance and Sexual Activity   Alcohol use: No   Drug use: No   Sexual activity: Not Currently  Other Topics Concern   Not on file  Social History Narrative   Not on file   Social Determinants of Health   Financial Resource Strain: Low Risk  (10/01/2022)   Received from [facility], [facility]   Overall Financial Resource Strain (CARDIA)    Difficulty of Paying Living Expenses: Not hard at all  Food Insecurity: No Food Insecurity (10/01/2022)   Received from [facility], [facility]   Hunger Vital Sign    Worried About Running Out of Food in the Last Year: Never true    Ran Out of Food in the Last Year: Never true  Transportation Needs: No Transportation Needs (10/01/2022)   Received from [facility], [facility]   [facility] - Transportation    In the past 12 months, has lack of transportation kept you from medical appointments or from getting medications?: No    Lack of Transportation (Non-Medical): No  Physical Activity: Not on file  Stress: Not on file  Social Connections: Not on file    Allergies:  Allergies  Allergen Reactions   Aminophylline Other (See Comments)    Headache    Cinnamon Other (See Comments)    Stomachache    Amoxicillin Rash   Latex Rash   Nsaids Rash   Penicillins Nausea And Vomiting   Potassium Nausea And Vomiting and Nausea Only   Potassium-Containing Compounds Nausea And Vomiting   Septra [Sulfamethoxazole-Trimethoprim] Rash   Sulfa Antibiotics Rash   Sulfasalazine Rash   Tape Rash    Paper tape ok   Theophyllines Other (See Comments)    Headache     Metabolic Disorder Labs: No results found for: "HGBA1C", "MPG" No results found for: "PROLACTIN" No results found for: "CHOL", "TRIG", "HDL", "CHOLHDL", "VLDL", "LDLCALC" Lab Results  Component Value Date   TSH 0.686 03/17/2021   TSH 0.172 (L) 05/16/2020    Therapeutic Level Labs: No results found for: "LITHIUM" No results found for: "VALPROATE" No results found for: "CBMZ"  Current Medications: Current Outpatient Medications  Medication Sig Dispense Refill   acetaminophen (TYLENOL) 325 MG tablet Take 325 mg by mouth every 6 (  six) hours as needed.     alendronate (FOSAMAX) 70 MG tablet Take 70 mg by mouth once a week. Take with a full glass of water on an empty stomach.     ALPRAZolam (XANAX) 0.25 MG tablet Take 0.25 mg by mouth as needed for anxiety. Before pap smears     atorvastatin (LIPITOR) 40 MG tablet Take 40 mg by mouth daily.     calcium citrate-vitamin D (CITRACAL+D) 315-200 MG-UNIT per tablet Take 1 tablet by mouth 2 (two) times daily.     carbamazepine (TEGRETOL) 200 MG tablet Take 200 mg by mouth 3 (three) times daily.     Cholecalciferol 100 MCG (4000 UT) TABS Take 1,000 Units by mouth.     esomeprazole (NEXIUM) 40 MG capsule Take 40 mg by mouth daily at 12 noon.     famotidine (PEPCID) 20 MG tablet Take 20 mg by mouth 2 (two) times daily.     hydroxypropyl methylcellulose / hypromellose (ISOPTO TEARS / GONIOVISC) 2.5 % ophthalmic solution 1 drop as needed for dry eyes.     iron polysaccharides (NIFEREX) 150 MG capsule Take 1 capsule (150 mg total) by mouth 2  (two) times daily for 30 days, THEN 1 capsule (150 mg total) daily. 150 capsule 0   levothyroxine (SYNTHROID) 100 MCG tablet Take 100 mcg by mouth every morning.     magnesium oxide (MAG-OX) 400 MG tablet Take 1 tablet by mouth 3 (three) times daily.     naproxen (NAPROSYN) 375 MG tablet Take 375 mg by mouth 2 (two) times daily with a meal.     OLANZapine (ZYPREXA) 10 MG tablet Take 1 tablet (10 mg total) by mouth at bedtime. 90 tablet 1   OLANZapine (ZYPREXA) 5 MG tablet Take 1 tablet (5 mg total) by mouth daily. 90 tablet 0   PARoxetine (PAXIL) 20 MG tablet Take 1 tablet (20 mg total) by mouth daily. 90 tablet 1   potassium chloride 20 MEQ/15ML (10%) SOLN Take 10 mEq by mouth daily.     vitamin E 400 UNIT capsule Take 400 Units by mouth daily.     No current facility-administered medications for this visit.     Musculoskeletal: Strength & Muscle Tone: decreased Gait & Station:  in a wheel chair Patient leans: N/A  Psychiatric Specialty Exam: Review of Systems  There were no vitals taken for this visit.There is no height or weight on file to calculate BMI.  General Appearance: {Appearance:22683}  Eye Contact:  {BHH EYE CONTACT:22684}  Speech:  Normal Rate  Volume:  Normal  Mood:  {BHH MOOD:22306}  Affect:  {Affect (PAA):22687}  Thought Process:  Irrelevant  Orientation:  {BHH ORIENTATION (PAA):22689}  Thought Content: Logical   Suicidal Thoughts:  {ST/HT (PAA):22692}  Homicidal Thoughts:  {ST/HT (PAA):22692}  Memory:  Immediate;   Fair  Judgement:  {Judgement (PAA):22694}  Insight:  {Insight (PAA):22695}  Psychomotor Activity:  Normal  Concentration:  Concentration: Fair and Attention Span: Fair  Recall:  {BHH GOOD/FAIR/POOR:22877}  Fund of Knowledge: Fair  Language: Fair  Akathisia:  No  Handed:  Right  AIMS (if indicated): {Desc; done/not:10129}  Assets:  Social Support  ADL's:  {BHH ZOX'W:96045}  Cognition: {chl bhh cognition:304700322}  Sleep:  {BHH  GOOD/FAIR/POOR:22877}   Screenings: GAD-7    Garment/textile technologist Visit from 12/23/2021 in [facility]  Total GAD-7 Score 1      PHQ2-9    Flowsheet Row Office Visit from 02/03/2022 in [facility] Office Visit from 12/23/2021 in [facility] Office Visit from 11/04/2021 in [facility]  PHQ-2 Total Score 0 0 0      Flowsheet Row ED from 11/22/2022 in [facility] at [facility] ED to Hosp-Admission (Discharged) from 10/18/2022 in [facility] MEDICAL TELEMETRY ED to Hosp-Admission (Discharged) from 04/18/2022 in [facility] (1A)  C-SSRS RISK CATEGORY No Risk No Risk No Risk        Assessment and Plan:  CHELAN HERINGER is a 67 y.o. year old female with a history of depression, anxiety, seizure disorder, cerebral palsy, GERD, osteoporosis , who presents for follow up appointment for below.    1. Schizophrenia, unspecified type (HCC) 2. MDD (major depressive disorder), recurrent, in partial remission (HCC) Acute stressors include: n/a Chronic stressors include: unemployment, limited care (elderly father, RN twice a week)   Transferred from [facility]/Dr. Johny Drilling. Admitted a few times, first at age 55 for depression.  According to the chart review, her father reports history of schizophrenia. No known developmental or intellectual disability. Collateral is limited due to her elderly father, who has memory loss. (Originally on olanzapine 2.5 mg in AM, 10 mg qhs, Paxil 20 mg daily)   Exam is notable for slightly less blunt affect, and she reports overall improvement in her mood symptoms.  She continues to demonstrate irrelevant, disorganized thought process, and rumination on somatic symptoms, although she has been calmer on most recent visits including today.  Although  uptitration of olanzapine to be considered, will not do it at this time given the hallucinations is not debilitating, and to mitigate risk of drowsiness.  Will continue olanzapine to target schizophrenia along with Paxil for depression.    3. Seizure disorder (HCC) According to the chart review, she has a diagnosis of seizure disorder and has been on carbamazepine.  Her father reports episode of staring without responding, and it is not clear whether this is related to seizure disorder, and/or schizophrenia.  Given she does not have any follow-up with neurologist, will make this referral to establish the care.      # cognition  Mini cog- 1/3 (unable to tell date, wrote two 12 in clock) Functional Status   IADL: Independent in the following:            Requires assistance with the following: managing finances, medications, driving ADL  Independent in the following: walking          Requires assistance with the following:bathing and hygiene, feeding, continence, grooming and toileting,  Folate, Vitamin B12 (wnl 42/2024), TSH low, fT4 0.99 05/2012) Images Head CT 02/2021 Brain: Normal anatomic configuration. Moderate periventricular white matter changes are present likely reflecting the sequela of small vessel ischemia, stable since prior examination. 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 No abnormal mass effect or midline shift. No evidence of acute intracranial hemorrhage or infarct. Ventricular size is normal. Cerebellum unremarkable. Neuropsych assessment:  Etiology: r/o vascular   No change. Her father reports some concern about memory loss. IADL is limited primarily due to her condition of cerebral palsy.  Will continue to assess as needed.    Plan Continue olanzapine 5 mg in the a.m., 10 mg at night - EKG QTc 386 msec 03/2022, hr 71 Continue Paxil 20 mg  daily  Next appointment: 11/11 at 11 AM, IP Referral to neurology - on carbamazepine 200 mg TID for seizure      The patient demonstrates the following risk factors for suicide: Chronic risk factors for suicide include: psychiatric disorder of depression . Acute risk factors for suicide include: unemployment. Protective factors for this patient include: positive social support and hope for the future. Considering these factors, the overall suicide risk at this point appears to be low. Patient is appropriate for outpatient follow up.   Collaboration of Care: Collaboration of Care: {BH OP Collaboration of Care:21014065}  Patient/Guardian was advised Release of Information must be obtained prior to any record release in order to collaborate their care with an outside provider. Patient/Guardian was advised if they have not already done so to contact the registration department to sign all necessary forms in order for [place] to release information regarding their care.   Consent: Patient/Guardian gives verbal consent for treatment and assignment of benefits for services provided during this visit. Patient/Guardian expressed understanding and agreed to proceed.    Neysa Hotter, MD 02/04/2023, 8:51 AM

## 2023-02-04 NOTE — Patient Instructions (Signed)
Iron Sucrose Injection What is this medication? IRON SUCROSE (EYE ern SOO krose) treats low levels of iron (iron deficiency anemia) in people with kidney disease. Iron is a mineral that plays an important role in making red blood cells, which carry oxygen from your lungs to the rest of your body. This medicine may be used for other purposes; ask your health care provider or pharmacist if you have questions. COMMON BRAND NAME(S): Venofer What should I tell my care team before I take this medication? They need to know if you have any of these conditions: Anemia not caused by low iron levels Heart disease High levels of iron in the blood Kidney disease Liver disease An unusual or allergic reaction to iron, other medications, foods, dyes, or preservatives Pregnant or trying to get pregnant Breastfeeding How should I use this medication? This medication is for infusion into a vein. It is given in a hospital or clinic setting. Talk to your care team about the use of this medication in children. While this medication may be prescribed for children as young as 2 years for selected conditions, precautions do apply. Overdosage: If you think you have taken too much of this medicine contact a poison control center or emergency room at once. NOTE: This medicine is only for you. Do not share this medicine with others. What if I miss a dose? Keep appointments for follow-up doses. It is important not to miss your dose. Call your care team if you are unable to keep an appointment. What may interact with this medication? Do not take this medication with any of the following: Deferoxamine Dimercaprol Other iron products This medication may also interact with the following: Chloramphenicol Deferasirox This list may not describe all possible interactions. Give your health care provider a list of all the medicines, herbs, non-prescription drugs, or dietary supplements you use. Also tell them if you smoke,  drink alcohol, or use illegal drugs. Some items may interact with your medicine. What should I watch for while using this medication? Visit your care team regularly. Tell your care team if your symptoms do not start to get better or if they get worse. You may need blood work done while you are taking this medication. You may need to follow a special diet. Talk to your care team. Foods that contain iron include: whole grains/cereals, dried fruits, beans, or peas, leafy green vegetables, and organ meats (liver, kidney). What side effects may I notice from receiving this medication? Side effects that you should report to your care team as soon as possible: Allergic reactions--skin rash, itching, hives, swelling of the face, lips, tongue, or throat Low blood pressure--dizziness, feeling faint or lightheaded, blurry vision Shortness of breath Side effects that usually do not require medical attention (report to your care team if they continue or are bothersome): Flushing Headache Joint pain Muscle pain Nausea Pain, redness, or irritation at injection site This list may not describe all possible side effects. Call your doctor for medical advice about side effects. You may report side effects to FDA at 1-800-FDA-1088. Where should I keep my medication? This medication is given in a hospital or clinic. It will not be stored at home. NOTE: This sheet is a summary. It may not cover all possible information. If you have questions about this medicine, talk to your doctor, pharmacist, or health care provider.  2024 Elsevier/Gold Standard (2022-08-22 00:00:00)

## 2023-02-06 ENCOUNTER — Emergency Department: Payer: Medicare Other

## 2023-02-06 ENCOUNTER — Other Ambulatory Visit: Payer: Self-pay

## 2023-02-06 ENCOUNTER — Inpatient Hospital Stay
Admission: EM | Admit: 2023-02-06 | Discharge: 2023-02-11 | DRG: 689 | Disposition: A | Discharge status: Home / Self Care | Payer: Medicare Other | Attending: Internal Medicine | Admitting: Internal Medicine

## 2023-02-06 DIAGNOSIS — G809 Cerebral palsy, unspecified: Secondary | ICD-10-CM | POA: Diagnosis present

## 2023-02-06 DIAGNOSIS — Z882 Allergy status to sulfonamides status: Secondary | ICD-10-CM | POA: Diagnosis not present

## 2023-02-06 DIAGNOSIS — Z8262 Family history of osteoporosis: Secondary | ICD-10-CM

## 2023-02-06 DIAGNOSIS — Z886 Allergy status to analgesic agent status: Secondary | ICD-10-CM | POA: Diagnosis not present

## 2023-02-06 DIAGNOSIS — F32A Depression, unspecified: Secondary | ICD-10-CM | POA: Diagnosis present

## 2023-02-06 DIAGNOSIS — Z79899 Other long term (current) drug therapy: Secondary | ICD-10-CM

## 2023-02-06 DIAGNOSIS — Z91040 Latex allergy status: Secondary | ICD-10-CM | POA: Diagnosis not present

## 2023-02-06 DIAGNOSIS — Z880 Allergy status to penicillin: Secondary | ICD-10-CM | POA: Diagnosis not present

## 2023-02-06 DIAGNOSIS — M810 Age-related osteoporosis without current pathological fracture: Secondary | ICD-10-CM | POA: Diagnosis present

## 2023-02-06 DIAGNOSIS — G40909 Epilepsy, unspecified, not intractable, without status epilepticus: Secondary | ICD-10-CM | POA: Diagnosis present

## 2023-02-06 DIAGNOSIS — L89152 Pressure ulcer of sacral region, stage 2: Secondary | ICD-10-CM | POA: Diagnosis present

## 2023-02-06 DIAGNOSIS — E785 Hyperlipidemia, unspecified: Secondary | ICD-10-CM | POA: Diagnosis present

## 2023-02-06 DIAGNOSIS — G9341 Metabolic encephalopathy: Secondary | ICD-10-CM | POA: Diagnosis present

## 2023-02-06 DIAGNOSIS — E119 Type 2 diabetes mellitus without complications: Secondary | ICD-10-CM | POA: Diagnosis present

## 2023-02-06 DIAGNOSIS — J189 Pneumonia, unspecified organism: Secondary | ICD-10-CM | POA: Diagnosis present

## 2023-02-06 DIAGNOSIS — Z9102 Food additives allergy status: Secondary | ICD-10-CM

## 2023-02-06 DIAGNOSIS — Z79890 Hormone replacement therapy (postmenopausal): Secondary | ICD-10-CM | POA: Diagnosis not present

## 2023-02-06 DIAGNOSIS — R569 Unspecified convulsions: Secondary | ICD-10-CM | POA: Diagnosis not present

## 2023-02-06 DIAGNOSIS — Z8249 Family history of ischemic heart disease and other diseases of the circulatory system: Secondary | ICD-10-CM

## 2023-02-06 DIAGNOSIS — R4182 Altered mental status, unspecified: Principal | ICD-10-CM

## 2023-02-06 DIAGNOSIS — Z833 Family history of diabetes mellitus: Secondary | ICD-10-CM

## 2023-02-06 DIAGNOSIS — H409 Unspecified glaucoma: Secondary | ICD-10-CM | POA: Diagnosis present

## 2023-02-06 DIAGNOSIS — Z8673 Personal history of transient ischemic attack (TIA), and cerebral infarction without residual deficits: Secondary | ICD-10-CM

## 2023-02-06 DIAGNOSIS — E039 Hypothyroidism, unspecified: Secondary | ICD-10-CM | POA: Diagnosis present

## 2023-02-06 DIAGNOSIS — B9689 Other specified bacterial agents as the cause of diseases classified elsewhere: Secondary | ICD-10-CM | POA: Diagnosis present

## 2023-02-06 DIAGNOSIS — Z823 Family history of stroke: Secondary | ICD-10-CM

## 2023-02-06 DIAGNOSIS — Z83438 Family history of other disorder of lipoprotein metabolism and other lipidemia: Secondary | ICD-10-CM

## 2023-02-06 DIAGNOSIS — K219 Gastro-esophageal reflux disease without esophagitis: Secondary | ICD-10-CM | POA: Diagnosis present

## 2023-02-06 DIAGNOSIS — Z888 Allergy status to other drugs, medicaments and biological substances status: Secondary | ICD-10-CM | POA: Diagnosis not present

## 2023-02-06 DIAGNOSIS — Z91048 Other nonmedicinal substance allergy status: Secondary | ICD-10-CM

## 2023-02-06 DIAGNOSIS — E669 Obesity, unspecified: Secondary | ICD-10-CM | POA: Diagnosis present

## 2023-02-06 DIAGNOSIS — Z6828 Body mass index (BMI) 28.0-28.9, adult: Secondary | ICD-10-CM

## 2023-02-06 DIAGNOSIS — G9340 Encephalopathy, unspecified: Secondary | ICD-10-CM | POA: Insufficient documentation

## 2023-02-06 DIAGNOSIS — N390 Urinary tract infection, site not specified: Principal | ICD-10-CM | POA: Diagnosis present

## 2023-02-06 DIAGNOSIS — L89610 Pressure ulcer of right heel, unstageable: Secondary | ICD-10-CM | POA: Diagnosis present

## 2023-02-06 DIAGNOSIS — L8990 Pressure ulcer of unspecified site, unspecified stage: Secondary | ICD-10-CM | POA: Insufficient documentation

## 2023-02-06 DIAGNOSIS — Z87891 Personal history of nicotine dependence: Secondary | ICD-10-CM

## 2023-02-06 DIAGNOSIS — Z803 Family history of malignant neoplasm of breast: Secondary | ICD-10-CM

## 2023-02-06 DIAGNOSIS — F2081 Schizophreniform disorder: Secondary | ICD-10-CM | POA: Diagnosis present

## 2023-02-06 DIAGNOSIS — Z7983 Long term (current) use of bisphosphonates: Secondary | ICD-10-CM

## 2023-02-06 LAB — COMPREHENSIVE METABOLIC PANEL
ALT: 11 U/L (ref 0–44)
AST: 15 U/L (ref 15–41)
Albumin: 3.2 g/dL — ABNORMAL LOW (ref 3.5–5.0)
Alkaline Phosphatase: 89 U/L (ref 38–126)
Anion gap: 9 (ref 5–15)
BUN: 24 mg/dL — ABNORMAL HIGH (ref 8–23)
CO2: 26 mmol/L (ref 22–32)
Calcium: 8.5 mg/dL — ABNORMAL LOW (ref 8.9–10.3)
Chloride: 105 mmol/L (ref 98–111)
Creatinine, Ser: 0.41 mg/dL — ABNORMAL LOW (ref 0.44–1.00)
GFR, Estimated: >60 mL/min (ref >60)
Glucose, Bld: 100 mg/dL — ABNORMAL HIGH (ref 70–99)
Potassium: 4.7 mmol/L (ref 3.5–5.1)
Sodium: 140 mmol/L (ref 135–145)
Total Bilirubin: <0.2 mg/dL (ref <1.2)
Total Protein: 6.7 g/dL (ref 6.5–8.1)

## 2023-02-06 LAB — URINALYSIS, W/ REFLEX TO CULTURE (INFECTION SUSPECTED)
Bilirubin Urine: NEGATIVE
Glucose, UA: NEGATIVE mg/dL
Ketones, ur: NEGATIVE mg/dL
Nitrite: POSITIVE — AB
Protein, ur: 30 mg/dL — AB
Specific Gravity, Urine: 1.018 (ref 1.005–1.030)
Squamous Epithelial / HPF: 0 /[HPF] (ref 0–5)
WBC, UA: >50 WBC/hpf (ref 0–5)
pH: 7 (ref 5.0–8.0)

## 2023-02-06 LAB — CBC
HCT: 34.8 % — ABNORMAL LOW (ref 36.0–46.0)
Hemoglobin: 9.9 g/dL — ABNORMAL LOW (ref 12.0–15.0)
MCH: 27 pg (ref 26.0–34.0)
MCHC: 28.4 g/dL — ABNORMAL LOW (ref 30.0–36.0)
MCV: 95.1 fL (ref 80.0–100.0)
Platelets: 428 10*3/uL — ABNORMAL HIGH (ref 150–400)
RBC: 3.66 MIL/uL — ABNORMAL LOW (ref 3.87–5.11)
RDW: 24.6 % — ABNORMAL HIGH (ref 11.5–15.5)
WBC: 7.3 10*3/uL (ref 4.0–10.5)
nRBC: 0 % (ref 0.0–0.2)

## 2023-02-06 LAB — LACTIC ACID, PLASMA
Lactic Acid, Venous: 1.5 mmol/L (ref 0.5–1.9)
Lactic Acid, Venous: 1.4 mmol/L (ref 0.5–1.9)

## 2023-02-06 LAB — CARBAMAZEPINE LEVEL, TOTAL: Carbamazepine Lvl: 6 ug/mL (ref 4.0–12.0)

## 2023-02-06 LAB — PROCALCITONIN: Procalcitonin: <0.1 ng/mL

## 2023-02-06 LAB — AMMONIA: Ammonia: 36 umol/L — ABNORMAL HIGH (ref 9–35)

## 2023-02-06 MED ORDER — DEXTROSE 5 % IV SOLN
500.0000 mg | INTRAVENOUS | Status: DC
  Administered 2023-02-06 – 2023-02-07 (×2): 500 mg via INTRAVENOUS
  Filled 2023-02-06 (×2): qty 5

## 2023-02-06 MED ORDER — SODIUM CHLORIDE 0.9 % IV SOLN
1.0000 g | INTRAVENOUS | Status: AC
  Administered 2023-02-06: 1 g via INTRAVENOUS
  Filled 2023-02-06: qty 10

## 2023-02-06 MED ORDER — ONDANSETRON HCL 4 MG PO TABS: 4.0000 mg | ORAL_TABLET | Freq: Four times a day (QID) | ORAL | Status: DC | PRN

## 2023-02-06 MED ORDER — ENOXAPARIN SODIUM 40 MG/0.4ML IJ SOSY
40.0000 mg | PREFILLED_SYRINGE | INTRAMUSCULAR | Status: DC
  Administered 2023-02-06 – 2023-02-10 (×5): 40 mg via SUBCUTANEOUS
  Filled 2023-02-06 (×5): qty 0.4

## 2023-02-06 MED ORDER — ONDANSETRON HCL 4 MG/2ML IJ SOLN: 4.0000 mg | Freq: Four times a day (QID) | INTRAMUSCULAR | Status: DC | PRN

## 2023-02-06 MED ORDER — LEVOTHYROXINE SODIUM 100 MCG PO TABS
100.0000 ug | ORAL_TABLET | Freq: Every morning | ORAL | Status: DC
  Administered 2023-02-07 – 2023-02-11 (×5): 100 ug via ORAL
  Filled 2023-02-06 (×5): qty 1

## 2023-02-06 MED ORDER — SODIUM CHLORIDE 0.9 % IV SOLN
2.0000 g | INTRAVENOUS | Status: AC
  Administered 2023-02-07 – 2023-02-11 (×5): 2 g via INTRAVENOUS
  Filled 2023-02-06 (×5): qty 20

## 2023-02-06 MED ORDER — CARBAMAZEPINE 200 MG PO TABS
200.0000 mg | ORAL_TABLET | Freq: Three times a day (TID) | ORAL | Status: DC
  Administered 2023-02-06 – 2023-02-11 (×15): 200 mg via ORAL
  Filled 2023-02-06 (×17): qty 1

## 2023-02-06 NOTE — ED Provider Notes (Signed)
[facility] Provider Note    Event Date/Time   First MD Initiated Contact with Patient 02/06/23 [phone]     (approximate)   History   Altered Mental Status  EM caveat: Confusion  HPI  Michele James is a 67 y.o. 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 Advises some confusion, intermittently for 3 days. But, has also been seeing a psych MD and neuro MD for ongoing testing. Supposed to see psych MD at noon. Last few days, she has not fatigued, not feeding herself well, and sometimes seems like she may be having some trouble taking her medications.   Physical Exam   Triage Vital Signs: ED Triage Vitals  Encounter Vitals Group     BP 02/06/23 0718 112/71     Systolic BP Percentile --      Diastolic BP Percentile --      Pulse Rate 02/06/23 0718 88     Resp 02/06/23 0718 18     Temp 02/06/23 0718 97.8 F (36.6 C)     Temp Source 02/06/23 0718 Oral     SpO2 02/06/23 0718 95 %     Weight 02/06/23 0729 158 lb 11.7 oz (72 kg)     Height 02/06/23 0729 5\' 3"  (1.6 m)     Head Circumference --      Peak Flow --      Pain Score 02/06/23 0718 0     Pain Loc --      Pain Education --      Exclude from Growth Chart --     Most recent vital signs: Vitals:   02/06/23 0718  BP: 112/71  Pulse: 88  Resp: 18  Temp: 97.8 F (36.6 C)  SpO2: 95%     General: Awake, no distress.  She has somewhat disoriented recognizes her name answer simple questions.  Does not speak with normal fluency but has a history of similar.  She does seem to be a little bit disoriented at times picking at things.  Perhaps a very mild delirium is present CV:  Good peripheral perfusion.  Normal tones and rate Resp:  Normal effort.  Clear bilateral with normal work of breathing Abd:  No distention.  Soft nontender  nondistended.  No costovertebral angle tenderness Other:  No rashes or skin lesions with exception to a very shallow, slowly healing ulcer over the right heel that has no drainage or purulence surrounding.  Elevated   ED Results / Procedures / Treatments   Labs (all labs ordered are listed, but only abnormal results are displayed) Labs Reviewed  COMPREHENSIVE METABOLIC PANEL - Abnormal; Notable for the following components:      Result Value   Glucose, Bld 100 (*)    BUN 24 (*)    Creatinine, Ser 0.41 (*)    Calcium 8.5 (*)    Albumin 3.2 (*)    All other components within normal limits  CBC - Abnormal; Notable for the following components:   RBC 3.66 (*)    Hemoglobin 9.9 (*)    HCT 34.8 (*)    MCHC 28.4 (*)    RDW 24.6 (*)    Platelets 428 (*)    All other components within normal limits  URINALYSIS, W/ REFLEX TO CULTURE (INFECTION SUSPECTED) - Abnormal; Notable for the following components:   Color, Urine YELLOW (*)  APPearance TURBID (*)    Hgb urine dipstick SMALL (*)    Protein, ur 30 (*)    Nitrite POSITIVE (*)    Leukocytes,Ua LARGE (*)    Bacteria, UA MANY (*)    All other components within normal limits  URINE CULTURE  CULTURE, BLOOD (ROUTINE X 2)  CULTURE, BLOOD (ROUTINE X 2)  LACTIC ACID, PLASMA  LACTIC ACID, PLASMA  CBG MONITORING, ED   Labs notable for anemia that appears to be chronic.  Comprehensive metabolic panel without acute abnormality except noted hypoalbuminemia, this also appears to be chronic  EKG  And interpreted by me at 7:40 AM heart rate 90 QRS 80 QTc 420 Slight artifact, normal sinus rhythm, mild nonspecific T wave abnormality possibly artifact.  No evidence of obvious ischemia   RADIOLOGY  DG Chest 2 View  Result Date: 02/06/2023 CLINICAL DATA:  Provided history: Confusion (non-ambulatory). EXAM: CHEST - 2 VIEW COMPARISON:  Prior chest radiographs 10/18/2022 and earlier. Chest CT 10/18/2022. FINDINGS: The cardiomediastinal  silhouette is unchanged. Aortic atherosclerosis. Moderate-sized hiatal hernia. Ill-defined opacity at the left lung base (best appreciated on the lateral view radiograph). No appreciable airspace consolidation on the right. No evidence of pleural effusion or pneumothorax. No acute osseous abnormality identified. Spondylosis and dextrocurvature of the thoracic spine. Degenerative changes of the right acromioclavicular joint. Chronic left rib fracture deformities. IMPRESSION: 1. Ill-defined opacity within the left lung base, which may reflect atelectasis or pneumonia. 2. Moderate-sized hiatal hernia. 3. Aortic Atherosclerosis (ICD10-I70.0). Electronically Signed   By: Jackey Loge D.O.   On: 02/06/2023 10:24    By clinical exam no obvious findings suggest a significant or severe pneumonia, I am most suspicious of urinary tract infection as causation at this point given the clinical history and also review of urinalysis.     CT Head Wo Contrast  Result Date: 02/06/2023 CLINICAL DATA:  Altered mental status, nontraumatic (Ped 0-17y) EXAM: CT HEAD WITHOUT CONTRAST TECHNIQUE: Contiguous axial images were obtained from the base of the skull through the vertex without intravenous contrast. RADIATION DOSE REDUCTION: This exam was performed according to the departmental dose-optimization program which includes automated exposure control, adjustment of the mA and/or kV according to patient size and/or use of iterative reconstruction technique. COMPARISON:  CT head 03/17/21 FINDINGS: Brain: No hemorrhage. No hydrocephalus. No extra-axial fluid collection. No CT evidence of an acute cortical infarct. No mass effect. No mass lesion. Vascular: No hyperdense vessel or unexpected calcification. Skull: Normal. Negative for fracture or focal lesion. Sinuses/Orbits: No middle ear or mastoid effusion. Paranasal sinuses are clear. Orbits are notable for bilateral lens replacement, otherwise unremarkable. Other: None IMPRESSION:  No CT etiology for altered mental status identified Electronically Signed   By: Lorenza Cambridge M.D.   On: 02/06/2023 10:22        PROCEDURES:  Critical Care performed: No  Procedures   MEDICATIONS ORDERED IN ED: Medications  cefTRIAXone (ROCEPHIN) 1 g in sodium chloride 0.9 % 100 mL IVPB (has no administration in time range)     IMPRESSION / MDM / ASSESSMENT AND PLAN / ED COURSE  I reviewed the triage vital signs and the nursing notes.                              Differential diagnosis includes, but is not limited to, delirium, encephalopathy metabolic infectious and other causes considered.  No obvious central localizing symptoms but given the clinical history and confusion  CT of the head was performed which shows no acute finding  Further workup demonstrates reassuring labs but evidence of obvious urinary tract infection without clear evidence of sepsis at this time.  Blood culture urine culture ordered.  Chest x-ray reviewed I suspect atelectasis is no obvious hypoxia, increased work of breathing or report of concern of pulmonary symptoms but consideration for pneumonia must also be given and monitored as an inpatient.  Patient's presentation is most consistent with acute complicated illness / injury requiring diagnostic workup.        ----------------------------------------- 10:54 AM on 02/06/2023 ----------------------------------------- Discussed with patient's father, understanding agreeable to plan for admission  Consulted with our hospitalist patient accepted to hospital service by Dr. Alvester Morin  FINAL CLINICAL IMPRESSION(S) / ED DIAGNOSES   Final diagnoses:  Altered mental status, unspecified altered mental status type  Urinary tract infection, acute  Encephalopathy, unspecified type     Rx / DC Orders   ED Discharge Orders     None        Note:  This document was prepared using Dragon voice recognition software and may include unintentional  dictation errors.   Sharyn Creamer, MD 02/06/23 [phone]

## 2023-02-06 NOTE — Assessment & Plan Note (Signed)
No reported seizure activity Cont home tegretol  Check tegretol level in setting of encephalopathy

## 2023-02-06 NOTE — Assessment & Plan Note (Addendum)
Worsening generalized confusion in setting of baseline cerebral palsy, schizophrenia, seizure disorder, history of CVA with noted UTI and pneumonia Unclear of general baseline CT head within normal No reported seizure activity  Will plan to treat UTI pneumonia-IV Rocephin azithromycin Check ammonia level Check tegretol level  Will otherwise continue to monitor mentation Reassess if there is any clinical decline

## 2023-02-06 NOTE — Assessment & Plan Note (Signed)
Chest imaging with Ill-defined opacity within the left lung base in setting of encephalopathy and concurrent UTI  No hypoxia at present PCT pending  Will expand antibiotics to IV rocephin and azithromycin  Urine strep and legionella  Blood and resp cultures  Follow

## 2023-02-06 NOTE — H&P (Signed)
History and Physical    Patient: Michele James:[id number] DOB: February 14, 1956 DOA: 02/06/2023 DOS: the patient was seen and examined on 02/06/2023 PCP: Dorothey Baseman, MD  Patient coming from: Home  Chief Complaint:  Chief Complaint  Patient presents with   Altered Mental Status   HPI: Michele James is a 67 y.o. 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  Limited history in setting of cephalopathy.  Per report, patient found to be confused by caretaker.  Has had cloudy malodorous urine for the past 3 days.  Per report, patient usually alert and oriented.  But only oriented to self.  No chest pain or shortness of breath.  No nausea or vomiting.  No reported fevers or chills.  No reported abdominal pain or diarrhea. Presented to the ER afebrile, hemodynamically stable.  Satting well on room air.  White count 7.3, hemoglobin 9.9, platelets 428, VBG stable, ammonia 36, lactate 1.5, urinalysis nitrite and leukocyte positive.  Creatinine 0.41.  Glucose 100.  Chest x-ray with ill-defined right lung base opacity concerning for pneumonia.  CT head stable. Review of Systems: As mentioned in the history of present illness. All other systems reviewed and are negative. Past Medical History:  Diagnosis Date   Anemia    Arthritis    knees   Cerebral palsy (HCC)    Depression    Diabetes mellitus without complication (HCC)    Diverticulosis    Dysphagia    Eczema    GERD (gastroesophageal reflux disease)    Glaucoma    Hemorrhoids    Hyperlipidemia    Hypothyroidism    Seizures (HCC)    none in over 10 yrs   Past Surgical History:  Procedure Laterality Date   BREAST BIOPSY Right 03/22/2020   stereo bx, ribbon clip,  FAT NECROSIS AND FIBROSIS   CATARACT EXTRACTION W/PHACO Right  02/06/2016   Procedure: CATARACT EXTRACTION PHACO AND INTRAOCULAR LENS PLACEMENT (IOC);  Surgeon: Lockie Mola, MD;  Location: [facility];  Service: Ophthalmology;  Laterality: Right;   CATARACT EXTRACTION W/PHACO Left 03/05/2016   Procedure: CATARACT EXTRACTION PHACO AND INTRAOCULAR LENS PLACEMENT (IOC);  Surgeon: Lockie Mola, MD;  Location: [facility];  Service: Ophthalmology;  Laterality: Left;   CHOLECYSTECTOMY N/A 11/16/2015   Procedure: LAPAROSCOPIC CHOLECYSTECTOMY;  Surgeon: Tiney Rouge III, MD;  Location: [facility] ORS;  Service: General;  Laterality: N/A;  attempted cholangiogram   COLONOSCOPY WITH PROPOFOL N/A 12/25/2014   Procedure: COLONOSCOPY WITH PROPOFOL;  Surgeon: Wallace Cullens, MD;  Location: [facility] END[facility];  Service: Gastroenterology;  Laterality: N/A;   COLONOSCOPY WITH PROPOFOL N/A 04/14/2019   Procedure: COLONOSCOPY WITH PROPOFOL;  Surgeon: Toledo, Boykin Nearing, MD;  Location: [facility] ENDOSCOPY;  Service: Gastroenterology;  Laterality: N/A;   COLONOSCOPY WITH PROPOFOL N/A 04/20/2022   Procedure: COLONOSCOPY WITH PROPOFOL;  Surgeon: Wyline Mood, MD;  Location: [facility] END[facility]COPY;  Service: Gastroenterology;  Laterality: N/A;   ENDOSCOPIC RETROGRADE CHOLANGIOPANCREATOGRAPHY (ERCP) WITH PROPOFOL N/A 11/18/2015   Procedure: ENDOSCOPIC RETROGRADE CHOLANGIOPANCREATOGRAPHY (ERCP) WITH PROPOFOL;  Surgeon: Midge Minium, MD;  Location: [facility] ENDOSCOPY;  Service: Endoscopy;  Laterality: N/A;   ESOPHAGOGASTRODUODENOSCOPY (EGD) WITH PROPOFOL N/A 04/14/2019   Procedure: ESOPHAGOGASTRODUODENOSCOPY (EGD) WITH PROPOFOL;  Surgeon: Toledo, Boykin Nearing, MD;  Location: [facility] ENDOSCOPY;  Service: Gastroenterology;  Laterality: N/A;   ESOPHAGOGASTRODUODENOSCOPY (EGD) WITH PROPOFOL N/A 04/20/2022   Procedure: ESOPHAGOGASTRODUODENOSCOPY (EGD) WITH PROPOFOL;  Surgeon: Wyline Mood, MD;  Location: [facility] ENDOSCOPY;  Service: Gastroenterology;  Laterality: N/A;   EYE SURGERY     Cataract Surgery     HERNIA REPAIR     Social History:  reports that she quit smoking about 42 years ago. Her smoking use included cigarettes. She has never used smokeless tobacco. She reports that she does not drink alcohol and does not use drugs.  Allergies  Allergen Reactions   Aminophylline Other (See Comments)    Headache   Cinnamon Other (See Comments)    Stomachache    Amoxicillin Rash   Latex Rash   Nsaids Rash   Penicillins Nausea And Vomiting   Potassium Nausea And Vomiting and Nausea Only   Potassium-Containing Compounds Nausea And Vomiting   Septra [Sulfamethoxazole-Trimethoprim] Rash   Sulfa Antibiotics Rash   Sulfasalazine Rash   Tape Rash    Paper tape ok   Theophyllines Other (See Comments)    Headache     Family History  Problem Relation Age of Onset   High blood pressure Mother    Hyperlipidemia Mother    Diabetes Mellitus II Mother    Stroke Mother    Osteoporosis Mother    High blood pressure Father    Breast cancer Maternal Aunt    Stroke Maternal Grandfather    Diabetes Mellitus II Maternal Grandfather    Stroke Maternal Grandmother    Diabetes Mellitus II Maternal Grandmother     Prior to Admission medications   Medication Sig Start Date End Date Taking? Authorizing Provider  acetaminophen (TYLENOL) 325 MG tablet Take 325 mg by mouth every 6 (six) hours as needed.    [provider]  alendronate (FOSAMAX) 70 MG tablet Take 70 mg by mouth once a week. Take with a full glass of water on an empty stomach.    [provider]  ALPRAZolam Prudy Feeler) 0.25 MG tablet Take 0.25 mg by mouth as needed for anxiety. Before pap smears    [provider]  atorvastatin (LIPITOR) 40 MG tablet Take 40 mg by mouth daily.    [provider]  calcium citrate-vitamin D (CITRACAL+D) 315-200 MG-UNIT per tablet Take 1 tablet by mouth 2 (two) times daily.    [provider]  carbamazepine (TEGRETOL) 200 MG tablet Take 200 mg by mouth 3 (three) times  daily.    [provider]  Cholecalciferol 100 MCG (4000 UT) TABS Take 1,000 Units by mouth.    [provider]  esomeprazole (NEXIUM) 40 MG capsule Take 40 mg by mouth daily at 12 noon.    [provider]  famotidine (PEPCID) 20 MG tablet Take 20 mg by mouth 2 (two) times daily. 04/23/20   [provider]  hydroxypropyl methylcellulose / hypromellose (ISOPTO TEARS / GONIOVISC) 2.5 % ophthalmic solution 1 drop as needed for dry eyes.    [provider]  iron polysaccharides (NIFEREX) 150 MG capsule Take 1 capsule (150 mg total) by mouth 2 (two) times daily for 30 days, THEN 1 capsule (150 mg total) daily. 04/21/22 08/19/22  Gillis Santa, MD  levothyroxine (SYNTHROID) 100 MCG tablet Take 100 mcg by mouth every morning. 03/04/21   [provider]  magnesium oxide (MAG-OX) 400 MG tablet Take 1 tablet by mouth 3 (three) times daily. 01/13/22   [provider]  naproxen (NAPROSYN) 375 MG tablet Take 375 mg by mouth 2 (two) times daily with a meal.    [provider]  OLANZapine (ZYPREXA) 10 MG tablet Take  1 tablet (10 mg total) by mouth at bedtime. 01/13/23 07/12/23  Neysa Hotter, MD  OLANZapine (ZYPREXA) 5 MG tablet Take 1 tablet (5 mg total) by mouth daily. 12/15/22 03/15/23  Neysa Hotter, MD  PARoxetine (PAXIL) 20 MG tablet Take 1 tablet (20 mg total) by mouth daily. 12/15/22 06/13/23  Neysa Hotter, MD  potassium chloride 20 MEQ/15ML (10%) SOLN Take 10 mEq by mouth daily.    [provider]  vitamin E 400 UNIT capsule Take 400 Units by mouth daily.    [provider]    Physical Exam: Vitals:   02/06/23 0718 02/06/23 0729  BP: 112/71   Pulse: 88   Resp: 18   Temp: 97.8 F (36.6 C)   TempSrc: Oral   SpO2: 95%   Weight:  72 kg  Height:  5\' 3"  (1.6 m)   Physical Exam Constitutional:      Appearance: She is obese.     Comments: + generalized confusion    HENT:     Head: Normocephalic.     Mouth/Throat:      Mouth: Mucous membranes are moist.  Eyes:     Pupils: Pupils are equal, round, and reactive to light.  Cardiovascular:     Rate and Rhythm: Normal rate and regular rhythm.  Pulmonary:     Effort: Pulmonary effort is normal.  Abdominal:     General: Bowel sounds are normal.  Musculoskeletal:     Comments: + upper extremity contractures    Neurological:     Comments: + generalized confusion and RUE contractures    Psychiatric:        Mood and Affect: Mood normal.     Data Reviewed:  There are no new results to review at this time.  DG Chest 2 View CLINICAL DATA:  Provided history: Confusion (non-ambulatory).  EXAM: CHEST - 2 VIEW  COMPARISON:  Prior chest radiographs 10/18/2022 and earlier. Chest CT 10/18/2022.  FINDINGS: The cardiomediastinal silhouette is unchanged. Aortic atherosclerosis. Moderate-sized hiatal hernia. Ill-defined opacity at the left lung base (best appreciated on the lateral view radiograph). No appreciable airspace consolidation on the right. No evidence of pleural effusion or pneumothorax. No acute osseous abnormality identified. Spondylosis and dextrocurvature of the thoracic spine. Degenerative changes of the right acromioclavicular joint. Chronic left rib fracture deformities.  IMPRESSION: 1. Ill-defined opacity within the left lung base, which may reflect atelectasis or pneumonia. 2. Moderate-sized hiatal hernia. 3. Aortic Atherosclerosis (ICD10-I70.0).  Electronically Signed   By: Jackey Loge D.O.   On: 02/06/2023 10:24 CT Head Wo Contrast CLINICAL DATA:  Altered mental status, nontraumatic (Ped 0-17y)  EXAM: CT HEAD WITHOUT CONTRAST  TECHNIQUE: Contiguous axial images were obtained from the base of the skull through the vertex without intravenous contrast.  RADIATION DOSE REDUCTION: This exam was performed according to the departmental dose-optimization program which includes automated exposure control, adjustment of the  mA and/or kV according to patient size and/or use of iterative reconstruction technique.  COMPARISON:  CT head 03/17/21  FINDINGS: Brain: No hemorrhage. No hydrocephalus. No extra-axial fluid collection. No CT evidence of an acute cortical infarct. No mass effect. No mass lesion.  Vascular: No hyperdense vessel or unexpected calcification.  Skull: Normal. Negative for fracture or focal lesion.  Sinuses/Orbits: No middle ear or mastoid effusion. Paranasal sinuses are clear. Orbits are notable for bilateral lens replacement, otherwise unremarkable.  Other: None  IMPRESSION: No CT etiology for altered mental status identified  Electronically Signed   By: Lorenza Cambridge  M.D.   On: 02/06/2023 10:22  Lab Results  Component Value Date   WBC 7.3 02/06/2023   HGB 9.9 (L) 02/06/2023   HCT 34.8 (L) 02/06/2023   MCV 95.1 02/06/2023   PLT 428 (H) 02/06/2023   Last metabolic panel Lab Results  Component Value Date   GLUCOSE 100 (H) 02/06/2023   NA 140 02/06/2023   K 4.7 02/06/2023   CL 105 02/06/2023   CO2 26 02/06/2023   BUN 24 (H) 02/06/2023   CREATININE 0.41 (L) 02/06/2023   GFRNONAA >60 02/06/2023   CALCIUM 8.5 (L) 02/06/2023   PHOS 3.5 04/21/2022   PROT 6.7 02/06/2023   ALBUMIN 3.2 (L) 02/06/2023   BILITOT <0.2 02/06/2023   ALKPHOS 89 02/06/2023   AST 15 02/06/2023   ALT 11 02/06/2023   ANIONGAP 9 02/06/2023    Assessment and Plan: * UTI (urinary tract infection) Urinalysis indicative of infection with noted acute worsening confusion Positive malodorous urine x 3 days IV Rocephin Urine culture Monitor  Acute encephalopathy Worsening generalized confusion in setting of baseline cerebral palsy, schizophrenia, seizure disorder, history of CVA with noted UTI and pneumonia Unclear of general baseline CT head within normal No reported seizure activity  Will plan to treat UTI pneumonia-IV Rocephin azithromycin Check ammonia level Check tegretol level  Will  otherwise continue to monitor mentation Reassess if there is any clinical decline   Pneumonia Chest imaging with Ill-defined opacity within the left lung base in setting of encephalopathy and concurrent UTI  No hypoxia at present PCT pending  Will expand antibiotics to IV rocephin and azithromycin  Urine strep and legionella  Blood and resp cultures  Follow   Schizophreniform disorder (HCC) Baseline schizophrenia  Titrate medication including zyprexa and paxil in setting of encephalopathy    Seizure (HCC) No reported seizure activity Cont home tegretol  Check tegretol level in setting of encephalopathy    Hypothyroidism (acquired) Cont synthroid   GERD (gastroesophageal reflux disease) PPI  Cerebral palsy (HCC) history of cerebral palsy with multiple contractures       Advance Care Planning:   Code Status: Full Code   Consults: None   Family Communication: No family at the bedside   Severity of Illness: The appropriate patient status for this patient is INPATIENT. Inpatient status is judged to be reasonable and necessary in order to provide the required intensity of service to ensure the patient's safety. The patient's presenting symptoms, physical exam findings, and initial radiographic and laboratory data in the context of their chronic comorbidities is felt to place them at high risk for further clinical deterioration. Furthermore, it is not anticipated that the patient will be medically stable for discharge from the hospital within 2 midnights of admission.   * I certify that at the point of admission it is my clinical judgment that the patient will require inpatient hospital care spanning beyond 2 midnights from the point of admission due to high intensity of service, high risk for further deterioration and high frequency of surveillance required.*  Author: Floydene Flock, MD 02/06/2023 12:40 PM  For on call review [email or web address].

## 2023-02-06 NOTE — ED Triage Notes (Signed)
Pt to ED via [facility] from home. Pt resides with parents and caretaker. EMS reports caretaker states AMS and cloudy odorous urine x3 days. EMS states pt normally A&Ox4 but is only alert to self currently. Pt rambling in triage.   Ems VS: 98.2  88 HR  136/76

## 2023-02-06 NOTE — Assessment & Plan Note (Signed)
PPI ?

## 2023-02-06 NOTE — Assessment & Plan Note (Signed)
 Cont synthroid

## 2023-02-06 NOTE — Assessment & Plan Note (Signed)
history of cerebral palsy with multiple contractures

## 2023-02-06 NOTE — Assessment & Plan Note (Signed)
Baseline schizophrenia  Titrate medication including zyprexa and paxil in setting of encephalopathy

## 2023-02-06 NOTE — Assessment & Plan Note (Signed)
Urinalysis indicative of infection with noted acute worsening confusion Positive malodorous urine x 3 days IV Rocephin Urine culture Monitor

## 2023-02-07 DIAGNOSIS — R4182 Altered mental status, unspecified: Secondary | ICD-10-CM

## 2023-02-07 DIAGNOSIS — N390 Urinary tract infection, site not specified: Secondary | ICD-10-CM | POA: Diagnosis not present

## 2023-02-07 DIAGNOSIS — G9340 Encephalopathy, unspecified: Secondary | ICD-10-CM

## 2023-02-07 LAB — BLOOD CULTURE ID PANEL (REFLEXED) - BCID2

## 2023-02-07 LAB — COMPREHENSIVE METABOLIC PANEL
ALT: 11 U/L (ref 0–44)
AST: 13 U/L — ABNORMAL LOW (ref 15–41)
Albumin: 2.7 g/dL — ABNORMAL LOW (ref 3.5–5.0)
Alkaline Phosphatase: 70 U/L (ref 38–126)
Anion gap: 8 (ref 5–15)
BUN: 16 mg/dL (ref 8–23)
CO2: 24 mmol/L (ref 22–32)
Calcium: 8 mg/dL — ABNORMAL LOW (ref 8.9–10.3)
Chloride: 103 mmol/L (ref 98–111)
Creatinine, Ser: 0.45 mg/dL (ref 0.44–1.00)
GFR, Estimated: >60 mL/min (ref >60)
Glucose, Bld: 83 mg/dL (ref 70–99)
Potassium: 3.6 mmol/L (ref 3.5–5.1)
Sodium: 135 mmol/L (ref 135–145)
Total Bilirubin: 0.5 mg/dL (ref <1.2)
Total Protein: 5.7 g/dL — ABNORMAL LOW (ref 6.5–8.1)

## 2023-02-07 LAB — CBC
HCT: 28.2 % — ABNORMAL LOW (ref 36.0–46.0)
Hemoglobin: 8.6 g/dL — ABNORMAL LOW (ref 12.0–15.0)
MCH: 27.2 pg (ref 26.0–34.0)
MCHC: 30.5 g/dL (ref 30.0–36.0)
MCV: 89.2 fL (ref 80.0–100.0)
Platelets: 350 10*3/uL (ref 150–400)
RBC: 3.16 MIL/uL — ABNORMAL LOW (ref 3.87–5.11)
RDW: 25 % — ABNORMAL HIGH (ref 11.5–15.5)
WBC: 7 10*3/uL (ref 4.0–10.5)
nRBC: 0 % (ref 0.0–0.2)

## 2023-02-07 LAB — GLUCOSE, CAPILLARY: Glucose-Capillary: 118 mg/dL — ABNORMAL HIGH (ref 70–99)

## 2023-02-07 LAB — STREP PNEUMONIAE URINARY ANTIGEN: Strep Pneumo Urinary Antigen: NEGATIVE

## 2023-02-07 MED ORDER — AZITHROMYCIN 250 MG PO TABS
500.0000 mg | ORAL_TABLET | Freq: Every day | ORAL | Status: AC
  Administered 2023-02-08 – 2023-02-10 (×3): 500 mg via ORAL
  Filled 2023-02-07 (×3): qty 2

## 2023-02-07 NOTE — Progress Notes (Signed)
PHARMACY - PHYSICIAN COMMUNICATION CRITICAL VALUE ALERT - BLOOD CULTURE IDENTIFICATION (BCID)  Michele James is an 67 y.o. female who presented to [facility] on 02/06/2023 with a chief complaint of UTI, PNA  Assessment:  Staph epi in 1 of 4 bottles, MecA detected,  most likely a contaminant (include suspected source if known)  Name of physician (or Provider) Contacted: Manuela Schwartz, NP   Current antibiotics: Ceftriaxone , Azithromycin   Changes to prescribed antibiotics recommended:  Patient is on recommended antibiotics - No changes needed  Results for orders placed or performed during the hospital encounter of 02/06/23  Blood Culture ID Panel (Reflexed) (Collected: 02/06/2023 12:03 PM)  Result Value Ref Range   Enterococcus faecalis NOT DETECTED NOT DETECTED   Enterococcus Faecium NOT DETECTED NOT DETECTED   Listeria monocytogenes NOT DETECTED NOT DETECTED   Staphylococcus species DETECTED (A) NOT DETECTED   Staphylococcus aureus (BCID) NOT DETECTED NOT DETECTED   Staphylococcus epidermidis DETECTED (A) NOT DETECTED   Staphylococcus lugdunensis NOT DETECTED NOT DETECTED   Streptococcus species NOT DETECTED NOT DETECTED   Streptococcus agalactiae NOT DETECTED NOT DETECTED   Streptococcus pneumoniae NOT DETECTED NOT DETECTED   Streptococcus pyogenes NOT DETECTED NOT DETECTED   A.calcoaceticus-baumannii NOT DETECTED NOT DETECTED   Bacteroides fragilis NOT DETECTED NOT DETECTED   Enterobacterales NOT DETECTED NOT DETECTED   Enterobacter cloacae complex NOT DETECTED NOT DETECTED   Escherichia coli NOT DETECTED NOT DETECTED   Klebsiella aerogenes NOT DETECTED NOT DETECTED   Klebsiella oxytoca NOT DETECTED NOT DETECTED   Klebsiella pneumoniae NOT DETECTED NOT DETECTED   Proteus species NOT DETECTED NOT DETECTED   Salmonella species NOT DETECTED NOT DETECTED   Serratia marcescens NOT DETECTED NOT DETECTED   Haemophilus influenzae NOT DETECTED NOT DETECTED   Neisseria meningitidis  NOT DETECTED NOT DETECTED   Pseudomonas aeruginosa NOT DETECTED NOT DETECTED   Stenotrophomonas maltophilia NOT DETECTED NOT DETECTED   Candida albicans NOT DETECTED NOT DETECTED   Candida auris NOT DETECTED NOT DETECTED   Candida glabrata NOT DETECTED NOT DETECTED   Candida krusei NOT DETECTED NOT DETECTED   Candida parapsilosis NOT DETECTED NOT DETECTED   Candida tropicalis NOT DETECTED NOT DETECTED   Cryptococcus neoformans/gattii NOT DETECTED NOT DETECTED   Methicillin resistance mecA/C DETECTED (A) NOT DETECTED    [name] D 02/07/2023  5:34 AM

## 2023-02-07 NOTE — Plan of Care (Signed)
  Problem: Activity: Goal: Ability to tolerate increased activity will improve Outcome: Progressing   Problem: Clinical Measurements: Goal: Ability to maintain a body temperature in the normal range will improve Outcome: Progressing   Problem: Respiratory: Goal: Ability to maintain adequate ventilation will improve Outcome: Progressing Goal: Ability to maintain a clear airway will improve Outcome: Progressing   Problem: Education: Goal: Knowledge of General Education information will improve Description: Including pain rating scale, medication(s)/side effects and non-pharmacologic comfort measures Outcome: Progressing   Problem: Health Behavior/Discharge Planning: Goal: Ability to manage health-related needs will improve Outcome: Progressing   Problem: Clinical Measurements: Goal: Ability to maintain clinical measurements within normal limits will improve Outcome: Progressing Goal: Will remain free from infection Outcome: Progressing Goal: Diagnostic test results will improve Outcome: Progressing Goal: Respiratory complications will improve Outcome: Progressing Goal: Cardiovascular complication will be avoided Outcome: Progressing   Problem: Nutrition: Goal: Adequate nutrition will be maintained Outcome: Progressing   Problem: Activity: Goal: Risk for activity intolerance will decrease Outcome: Progressing   Problem: Coping: Goal: Level of anxiety will decrease Outcome: Progressing   Problem: Elimination: Goal: Will not experience complications related to bowel motility Outcome: Progressing Goal: Will not experience complications related to urinary retention Outcome: Progressing   Problem: Pain Management: Goal: General experience of comfort will improve Outcome: Progressing   Problem: Safety: Goal: Ability to remain free from injury will improve Outcome: Progressing   Problem: Skin Integrity: Goal: Risk for impaired skin integrity will decrease Outcome:  Progressing

## 2023-02-07 NOTE — Progress Notes (Signed)
PHARMACIST - PHYSICIAN COMMUNICATION  CONCERNING: Antibiotic IV to Oral Route Change Policy  RECOMMENDATION: This patient is receiving azithromycin by the intravenous route.  Based on criteria approved by the Pharmacy and Therapeutics Committee, the antibiotic(s) is/are being converted to the equivalent oral dose form(s).  DESCRIPTION: These criteria include: Patient being treated for a respiratory tract infection, urinary tract infection, cellulitis or clostridium difficile associated diarrhea if on metronidazole The patient is not neutropenic and does not exhibit a GI malabsorption state The patient is eating (either orally or via tube) and/or has been taking other orally administered medications for a least 24 hours The patient is improving clinically and has a Tmax < 100.5  If you have questions about this conversion, please contact the Pharmacy Department   Tressie Ellis 02/07/23

## 2023-02-07 NOTE — Progress Notes (Signed)
Progress Note   Patient: Michele James:[id number] DOB: 12-06-1955 DOA: 02/06/2023     1 DOS: the patient was seen and examined on 02/07/2023   Brief hospital course: Michele James is a 67 y.o. 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  Limited history in setting of cephalopathy.  Per report, patient found to be confused by caretaker.  Has had cloudy malodorous urine for the past 3 days.  Per report, patient usually alert and oriented.  But only oriented to self.  No chest pain or shortness of breath.  No nausea or vomiting.  No reported fevers or chills.  No reported abdominal pain or diarrhea. Presented to the ER afebrile, hemodynamically stable.  Satting well on room air.  White count 7.3, hemoglobin 9.9, platelets 428, VBG stable, ammonia 36, lactate 1.5, urinalysis nitrite and leukocyte positive.  Creatinine 0.41.  Glucose 100.  Chest x-ray with ill-defined right lung base opacity concerning for pneumonia.  CT head stable.   Assessment and Plan: UTI (urinary tract infection) Urinalysis indicative of infection with noted acute worsening confusion Positive malodorous urine x 3 days IV Rocephin Urine culture Monitor   Acute metabolic encephalopathy Worsening generalized confusion in setting of baseline cerebral palsy, schizophrenia, seizure disorder, history of CVA with noted UTI and pneumonia Unclear of general baseline CT head within normal No reported seizure activity  Continue current antibiotics Follow-up on tegretol level  Monitor neurochecks     Community-acquired pneumonia Chest imaging with Ill-defined opacity within the left lung base in setting of encephalopathy and concurrent UTI  No hypoxia at present PCT pending  Will expand antibiotics to IV rocephin  and azithromycin  Urine strep and legionella  Blood and resp cultures     Schizophreniform disorder (HCC) Baseline schizophrenia  Titrate medication including zyprexa and paxil in setting of encephalopathy      Seizure (HCC) No reported seizure activity Cont home tegretol  Check tegretol level in setting of encephalopathy      Hypothyroidism (acquired) Cont synthroid    GERD (gastroesophageal reflux disease) PPI   Cerebral palsy (HCC) history of cerebral palsy with multiple contractures      Advance Care Planning:   Code Status: Full Code    Consults: None    Family Communication: No family at the bedside   Subjective:  Patient seen and examined at bedside this morning Denies nausea vomiting abdominal pain Mental status improved  Physical Exam: onstitutional:      Appearance: She is obese.     Comments: + generalized confusion    HENT:     Head: Normocephalic.     Mouth/Throat:     Mouth: Mucous membranes are moist.  Eyes:     Pupils: Pupils are equal, round, and reactive to light.  Cardiovascular:     Rate and Rhythm: Normal rate and regular rhythm.  Pulmonary:     Effort: Pulmonary effort is normal.  Abdominal:     General: Bowel sounds are normal.  Musculoskeletal:     Comments: + upper extremity contractures    Neurological:     Comments: + generalized confusion and RUE contractures    Psychiatric:        Mood and Affect: Mood normal.  Vitals:   02/07/23 0328 02/07/23 0917 02/07/23 0917 02/07/23 0919  BP: 113/63   (!) 105/49  Pulse: 85 77 76 79  Resp: 20   19  Temp: 98 F (36.7 C)   97.6 F (36.4 C)  TempSrc:      SpO2: 97% 97% 98% 100%  Weight:      Height:        Data Reviewed: I reviewed patient's CT of the brain that did not show any acute intracranial pathology    Latest Ref Rng & Units 02/07/2023    3:19 AM 02/06/2023    7:27 AM 01/14/2023   10:22 AM  CBC  WBC 4.0 - 10.5 K/uL 7.0  7.3  7.4   Hemoglobin 12.0 - 15.0 g/dL 8.6   9.9  8.1   Hematocrit 36.0 - 46.0 % 28.2  34.8  27.6   Platelets 150 - 400 K/uL 350  428  471        Latest Ref Rng & Units 02/07/2023    3:19 AM 02/06/2023    7:27 AM 11/22/2022    7:48 AM  BMP  Glucose 70 - 99 mg/dL 83  161  97   BUN 8 - 23 mg/dL 16  24  12    Creatinine 0.44 - 1.00 mg/dL 0.96  0.45  4.09   Sodium 135 - 145 mmol/L 135  140  136   Potassium 3.5 - 5.1 mmol/L 3.6  4.7  3.9   Chloride 98 - 111 mmol/L 103  105  100   CO2 22 - 32 mmol/L 24  26  27    Calcium 8.9 - 10.3 mg/dL 8.0  8.5  8.4      Disposition: Status is: Inpatient  Time spent: 56 minutes  Author: Loyce Dys, MD 02/07/2023 3:29 PM  For on call review [email or web address].

## 2023-02-08 DIAGNOSIS — R4182 Altered mental status, unspecified: Secondary | ICD-10-CM | POA: Diagnosis not present

## 2023-02-08 DIAGNOSIS — G9340 Encephalopathy, unspecified: Secondary | ICD-10-CM | POA: Diagnosis not present

## 2023-02-08 DIAGNOSIS — N390 Urinary tract infection, site not specified: Secondary | ICD-10-CM | POA: Diagnosis not present

## 2023-02-08 LAB — BASIC METABOLIC PANEL
Anion gap: 9 (ref 5–15)
BUN: 9 mg/dL (ref 8–23)
CO2: 23 mmol/L (ref 22–32)
Calcium: 8.2 mg/dL — ABNORMAL LOW (ref 8.9–10.3)
Chloride: 104 mmol/L (ref 98–111)
Creatinine, Ser: <0.3 mg/dL — ABNORMAL LOW (ref 0.44–1.00)
Glucose, Bld: 89 mg/dL (ref 70–99)
Potassium: 3.5 mmol/L (ref 3.5–5.1)
Sodium: 136 mmol/L (ref 135–145)

## 2023-02-08 LAB — URINE CULTURE: Culture: >=100000 — AB

## 2023-02-08 LAB — CBC WITH DIFFERENTIAL/PLATELET
Abs Immature Granulocytes: 0.03 10*3/uL (ref 0.00–0.07)
Basophils Absolute: 0.1 10*3/uL (ref 0.0–0.1)
Basophils Relative: 1 %
Eosinophils Absolute: 0.4 10*3/uL (ref 0.0–0.5)
Eosinophils Relative: 7 %
HCT: 32.1 % — ABNORMAL LOW (ref 36.0–46.0)
Hemoglobin: 9.6 g/dL — ABNORMAL LOW (ref 12.0–15.0)
Immature Granulocytes: 1 %
Lymphocytes Relative: 13 %
Lymphs Abs: 0.7 10*3/uL (ref 0.7–4.0)
MCH: 27.7 pg (ref 26.0–34.0)
MCHC: 29.9 g/dL — ABNORMAL LOW (ref 30.0–36.0)
MCV: 92.5 fL (ref 80.0–100.0)
Monocytes Absolute: 0.8 10*3/uL (ref 0.1–1.0)
Monocytes Relative: 15 %
Neutro Abs: 3.3 10*3/uL (ref 1.7–7.7)
Neutrophils Relative %: 63 %
Platelets: 370 10*3/uL (ref 150–400)
RBC: 3.47 MIL/uL — ABNORMAL LOW (ref 3.87–5.11)
RDW: 24.8 % — ABNORMAL HIGH (ref 11.5–15.5)
Smear Review: NORMAL
WBC: 5.2 10*3/uL (ref 4.0–10.5)
nRBC: 0 % (ref 0.0–0.2)

## 2023-02-08 LAB — GLUCOSE, CAPILLARY: Glucose-Capillary: 95 mg/dL (ref 70–99)

## 2023-02-08 NOTE — Progress Notes (Signed)
Progress Note   Patient: Michele James VWU:[id number] DOB: [birth year] DOA: 02/06/2023     2 DOS: the patient was seen and examined on 02/08/2023   Subjective: Mental status much improved today Patient was seen together with caretaker at bedside According to caretaker mental status is approaching baseline levels Denies nausea vomiting abdominal pain chest pain or cough  Brief hospital course: Michele James is a 67 y.o. 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  Limited history in setting of cephalopathy.  Per report, patient found to be confused by caretaker.  Has had cloudy malodorous urine for the past 3 days.  Per report, patient usually alert and oriented.  But only oriented to self.  No chest pain or shortness of breath.  No nausea or vomiting.  No reported fevers or chills.  No reported abdominal pain or diarrhea. Presented to the ER afebrile, hemodynamically stable.  Satting well on room air.  White count 7.3, hemoglobin 9.9, platelets 428, VBG stable, ammonia 36, lactate 1.5, urinalysis nitrite and leukocyte positive.  Creatinine 0.41.  Glucose 100.  Chest x-ray with ill-defined right lung base opacity concerning for pneumonia.  CT head stable.     Assessment and Plan: UTI (urinary tract infection) Urinalysis indicative of infection with noted acute worsening confusion Positive malodorous urine x 3 days Continue IV antibiotics  Acute metabolic encephalopathy-improved Worsening generalized confusion in setting of baseline cerebral palsy, schizophrenia, seizure disorder, history of CVA with noted UTI and pneumonia Unclear of general baseline CT head within normal No reported seizure activity  Continue current antibiotics Follow-up on tegretol level  Continue  neurochecks     Community-acquired pneumonia Chest imaging with Ill-defined opacity within the left lung base in setting of encephalopathy and concurrent UTI  No hypoxia at present Continue antibiotics     Schizophreniform disorder (HCC) Baseline schizophrenia  Titrate medication including zyprexa and paxil in setting of encephalopathy      Seizure (HCC) No reported seizure activity Cont home tegretol  Check tegretol level in setting of encephalopathy      Hypothyroidism (acquired) Cont synthroid    GERD (gastroesophageal reflux disease) PPI   Cerebral palsy (HCC) history of cerebral palsy with multiple contractures       Advance Care Planning:   Code Status: Full Code    Consults: None    Family Communication: No family at the bedside    Subjective:  Patient seen and family at bedside this morning   Physical Exam: onstitutional:      Appearance: She is obese.     Comments: + generalized confusion    HENT:     Head: Normocephalic.     Mouth/Throat:     Mouth: Mucous membranes are moist.  Eyes:     Pupils: Pupils are equal, round, and reactive to light.  Cardiovascular:     Rate and Rhythm: Normal rate and regular rhythm.  Pulmonary:     Effort: Pulmonary effort is normal.  Abdominal:     General: Bowel sounds are normal.  Musculoskeletal:     Comments: + upper extremity contractures    Neurological:     Comments: + generalized confusion and RUE contractures    Psychiatric:        Mood and Affect: Mood normal.   Disposition: Status is: Inpatient   Time spent: 46 minutes  Data Reviewed: I reviewed  patient's CT of the brain that did not show any acute intracranial pathology     Latest Ref Rng & Units 02/08/2023    7:24 AM 02/07/2023    3:19 AM 02/06/2023    7:27 AM  CBC  WBC 4.0 - 10.5 K/uL 5.2  7.0  7.3   Hemoglobin 12.0 - 15.0 g/dL 9.6  8.6  9.9   Hematocrit 36.0 - 46.0 % 32.1  28.2  34.8   Platelets 150 - 400 K/uL 370  350  428         Latest Ref Rng & Units 02/08/2023    7:24 AM 02/07/2023    3:19 AM 02/06/2023    7:27 AM  BMP  Glucose 70 - 99 mg/dL 89  83  409   BUN 8 - 23 mg/dL 9  16  24    Creatinine 0.44 - 1.00 mg/dL <8.11  9.14  7.82   Sodium 135 - 145 mmol/L 136  135  140   Potassium 3.5 - 5.1 mmol/L 3.5  3.6  4.7   Chloride 98 - 111 mmol/L 104  103  105   CO2 22 - 32 mmol/L 23  24  26    Calcium 8.9 - 10.3 mg/dL 8.2  8.0  8.5     Vitals:   02/08/23 0000 02/08/23 0354 02/08/23 0819 02/08/23 1715  BP: (!) 108/55 131/77 (!) 143/71 (!) 117/59  Pulse: 78 72 76 77  Resp: 18  18 18   Temp: 98.5 F (36.9 C)  98.2 F (36.8 C) 98 F (36.7 C)  TempSrc: Oral  Oral   SpO2: 94% 98% 95% 100%  Weight:      Height:         Author: Loyce Dys, MD 02/08/2023 5:35 PM  For on call review [email or web address].

## 2023-02-08 NOTE — Plan of Care (Signed)
  Problem: Clinical Measurements: Goal: Ability to maintain a body temperature in the normal range will improve Outcome: Progressing   

## 2023-02-09 ENCOUNTER — Ambulatory Visit: Payer: Medicare Other | Admitting: Psychiatry

## 2023-02-09 DIAGNOSIS — N390 Urinary tract infection, site not specified: Secondary | ICD-10-CM | POA: Diagnosis not present

## 2023-02-09 DIAGNOSIS — R4182 Altered mental status, unspecified: Secondary | ICD-10-CM | POA: Diagnosis not present

## 2023-02-09 DIAGNOSIS — G9340 Encephalopathy, unspecified: Secondary | ICD-10-CM | POA: Diagnosis not present

## 2023-02-09 LAB — BLOOD GAS, VENOUS
Acid-Base Excess: 3.3 mmol/L — ABNORMAL HIGH (ref 0.0–2.0)
Bicarbonate: 27.8 mmol/L (ref 20.0–28.0)
O2 Saturation: 45.7 mmol/L (ref 0.0–2.0)
Patient temperature: 37
Patient temperature: 45.7 %
pCO2, Ven: 41 mm[Hg] — ABNORMAL LOW (ref 44–60)
pH, Ven: 7.44 — ABNORMAL HIGH (ref 7.25–7.43)

## 2023-02-09 LAB — CBC WITH DIFFERENTIAL/PLATELET
Abs Immature Granulocytes: 0.02 10*3/uL (ref 0.00–0.07)
Basophils Absolute: 0.1 10*3/uL (ref 0.0–0.1)
Basophils Relative: 1 %
Eosinophils Absolute: 0.2 10*3/uL (ref 0.0–0.5)
Eosinophils Relative: 4 %
HCT: 31.4 % — ABNORMAL LOW (ref 36.0–46.0)
Hemoglobin: 9.3 g/dL — ABNORMAL LOW (ref 12.0–15.0)
Immature Granulocytes: 0 %
Lymphocytes Relative: 13 %
Lymphs Abs: 0.8 10*3/uL (ref 0.7–4.0)
MCH: 27.6 pg (ref 26.0–34.0)
MCHC: 29.6 g/dL — ABNORMAL LOW (ref 30.0–36.0)
MCV: 93.2 fL (ref 80.0–100.0)
Monocytes Absolute: 0.7 10*3/uL (ref 0.1–1.0)
Monocytes Relative: 12 %
Neutro Abs: 4 10*3/uL (ref 1.7–7.7)
Neutrophils Relative %: 70 %
Platelets: 358 10*3/uL (ref 150–400)
RBC: 3.37 MIL/uL — ABNORMAL LOW (ref 3.87–5.11)
RDW: 24.6 % — ABNORMAL HIGH (ref 11.5–15.5)
Smear Review: NORMAL
WBC: 5.8 10*3/uL (ref 4.0–10.5)
nRBC: 0 % (ref 0.0–0.2)

## 2023-02-09 LAB — BASIC METABOLIC PANEL
Anion gap: 4 — ABNORMAL LOW (ref 5–15)
BUN: 9 mg/dL (ref 8–23)
CO2: 25 mmol/L (ref 22–32)
Calcium: 8 mg/dL — ABNORMAL LOW (ref 8.9–10.3)
Chloride: 105 mmol/L (ref 98–111)
Creatinine, Ser: 0.46 mg/dL (ref 0.44–1.00)
GFR, Estimated: >60 mL/min (ref >60)
Glucose, Bld: 103 mg/dL — ABNORMAL HIGH (ref 70–99)
Potassium: 3.9 mmol/L (ref 3.5–5.1)
Sodium: 134 mmol/L — ABNORMAL LOW (ref 135–145)

## 2023-02-09 MED ORDER — ACETAMINOPHEN 325 MG PO TABS
650.0000 mg | ORAL_TABLET | Freq: Once | ORAL | Status: AC
  Administered 2023-02-09: 650 mg via ORAL
  Filled 2023-02-09: qty 2

## 2023-02-09 NOTE — Evaluation (Signed)
Occupational Therapy Evaluation Patient Details Name: Michele James MRN: [id number] DOB: [birth year] Today's Date: 02/09/2023   History of Present Illness Michele James is a 67 y.o. 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   Clinical Impression   Pt was seen for OT evaluation this date. Prior to hospital admission, pt reports being W/c bound and needing A for all ADL's. Pt lives with parents and has a caretaker that comes in to help with ADL's like bathing. Pt presents to acute OT demonstrating impaired ADL performance and functional mobility 2/2 (See OT problem list for additional functional deficits). Upon arrival to room pt sitting upright in bed finishing lunch, agreeable to tx. Pt required max A from supine>sitting at the EOB. Pt required Max Ax2 for a squat pivot t/f from bedside to chair. Pt left seated in chair with call bell within reach and all needs met. RN notified of mobility status. Pt would benefit from skilled OT services to address noted impairments and functional limitations (see below for any additional details) in order to maximize safety and independence while minimizing falls risk and caregiver burden. Anticipate the need for follow up OT services upon acute hospital DC.        If plan is discharge home, recommend the following: Two people to help with walking and/or transfers;Two people to help with bathing/dressing/bathroom;Help with stairs or ramp for entrance;Assist for transportation;Assistance with cooking/housework    Functional Status Assessment  Patient has had a recent decline in their functional status and demonstrates the ability to make significant improvements in function in a reasonable and predictable amount of time.  Equipment Recommendations   BSC/3in1    Recommendations for Other Services       Precautions / Restrictions Precautions Precautions: Fall Restrictions Weight Bearing Restrictions: No      Mobility Bed Mobility Overal bed mobility: Needs Assistance Bed Mobility: Supine to Sit     Supine to sit: Max assist, HOB elevated, Used rails       Patient Response: Cooperative  Transfers Overall transfer level: Needs assistance   Transfers: Bed to chair/wheelchair/BSC     Squat pivot transfers: Max assist, +2 physical assistance              Balance Overall balance assessment: Needs assistance Sitting-balance support: No upper extremity supported, Feet supported Sitting balance-Leahy Scale: Poor       Standing balance-Leahy Scale: Zero                             ADL either performed or assessed with clinical judgement   ADL Overall ADL's : Needs assistance/impaired                         Toilet Transfer: Maximal assistance;+2 for physical assistance;Squat-pivot;BSC/3in1 Toilet Transfer Details (indicate cue type and reason): simulated         Functional mobility during ADLs: Maximal assistance;+2 for physical assistance General ADL Comments: Pt required Max Ax2 for a squat pivot t/f from bedside to chair.     Vision Patient Visual Report: No change from baseline       Perception         Praxis         Pertinent Vitals/Pain Pain Assessment Pain Assessment: No/denies pain  Extremity/Trunk Assessment Upper Extremity Assessment Upper Extremity Assessment: LUE deficits/detail LUE: Unable to fully assess due to immobilization   Lower Extremity Assessment Lower Extremity Assessment: Defer to PT evaluation       Communication Communication Communication: No apparent difficulties Cueing Techniques: Verbal cues   Cognition Arousal: Alert Behavior During Therapy: WFL for tasks assessed/performed Overall Cognitive Status: No family/caregiver  present to determine baseline cognitive functioning                                       General Comments       Exercises     Shoulder Instructions      Home Living Family/patient expects to be discharged to:: Private residence Living Arrangements: Parent Available Help at Discharge: Family;Personal care attendant Type of Home: House Home Access: Ramped entrance     Home Layout: One level     Bathroom Shower/Tub: Producer, television/film/video: Standard Bathroom Accessibility: Yes How Accessible: Accessible via wheelchair Home Equipment: Shower seat;Wheelchair - manual;Crutches          Prior Functioning/Environment Prior Level of Function : Needs assist             Mobility Comments: Pt states that she requires "a lot" of help with transfers, has not stood in years per patient report. Uses manual w/c for mobility around the house; states that she is able to mobilize the w/c herself with her R arm. ADLs Comments: Caretaker assists for showering, dressing, toilet transfers at baseline. grooming and eating from w/c level. Pt reports using crutches to t/f from w/c to toilet. Pt has LUE and BLE contracture at baseline.        OT Problem List: Decreased strength;Decreased range of motion;Decreased coordination;Decreased activity tolerance;Decreased cognition      OT Treatment/Interventions: Self-care/ADL training;Therapeutic exercise;Therapeutic activities;Energy conservation;Patient/family education;DME and/or AE instruction    OT Goals(Current goals can be found in the care plan section) Acute Rehab OT Goals Patient Stated Goal: to go home OT Goal Formulation: With patient Time For Goal Achievement: 02/23/23 Potential to Achieve Goals: Fair  OT Frequency: Min 1X/week    Co-evaluation              AM-PAC OT "6 Clicks" Daily Activity     Outcome Measure Help from another person eating meals?: A Little Help from another person taking  care of personal grooming?: A Little Help from another person toileting, which includes using toliet, bedpan, or urinal?: A Lot Help from another person bathing (including washing, rinsing, drying)?: A Lot Help from another person to put on and taking off regular upper body clothing?: A Lot Help from another person to put on and taking off regular lower body clothing?: Total 6 Click Score: 13   End of Session Nurse Communication: Mobility status  Activity Tolerance: Patient tolerated treatment well Patient left: in chair;with call bell/phone within reach;with chair alarm set  OT Visit Diagnosis: Other abnormalities of gait and mobility (R26.89);Muscle weakness (generalized) (M62.81)                Time: 2595-6387 OT Time Calculation (min): 23 min Charges:     Butch Penny, SOT

## 2023-02-09 NOTE — Progress Notes (Signed)
Progress Note   Patient: Michele James ZOX:[id number] DOB: [birth year] DOA: 02/06/2023     3 DOS: the patient was seen and examined on 02/09/2023   Subjective: I have discussed with patient's caretaker and mental status is almost at baseline however still off from her baseline Denies nausea vomiting abdominal pain or chest pain  Brief hospital course: Michele James is a 67 y.o. 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  Limited history in setting of cephalopathy.  Per report, patient found to be confused by caretaker.  Has had cloudy malodorous urine for the past 3 days.  Per report, patient usually alert and oriented.  But only oriented to self.  No chest pain or shortness of breath.  No nausea or vomiting.  No reported fevers or chills.  No reported abdominal pain or diarrhea. Presented to the ER afebrile, hemodynamically stable.  Satting well on room air.  White count 7.3, hemoglobin 9.9, platelets 428, VBG stable, ammonia 36, lactate 1.5, urinalysis nitrite and leukocyte positive.  Creatinine 0.41.  Glucose 100.  Chest x-ray with ill-defined right lung base opacity concerning for pneumonia.  CT head stable.     Assessment and Plan: UTI (urinary tract infection) Secondary to procidentia stuartii Urinalysis indicative of infection with noted acute worsening confusion Positive malodorous urine x 3 days Continue antibiotics   Acute metabolic encephalopathy-improved Worsening generalized confusion in setting of baseline cerebral palsy, schizophrenia, seizure disorder, history of CVA with noted UTI and pneumonia Unclear of general baseline CT head within normal No reported seizure activity  Continue current antibiotics Continue neurochecks     Community-acquired  pneumonia Chest imaging with Ill-defined opacity within the left lung base in setting of encephalopathy and concurrent UTI  No hypoxia at present Continue antibiotics     Schizophreniform disorder (HCC) Baseline schizophrenia  Titrate medication including zyprexa and paxil in setting of encephalopathy      Seizure (HCC) No reported seizure activity Continue home dose of tegretol  Check tegretol level in setting of encephalopathy      Hypothyroidism (acquired) Continue Synthroid   GERD (gastroesophageal reflux disease) PPI   Cerebral palsy (HCC) history of cerebral palsy with multiple contractures       Advance Care Planning:   Code Status: Full Code    Consults: None    Family Communication: No family at the bedside       Physical Exam: General: Confusion much improved today HENT:     Head: Normocephalic.     Mouth/Throat:     Mouth: Mucous membranes are moist.  Eyes:     Pupils: Pupils are equal, round, and reactive to light.  Cardiovascular:     Rate and Rhythm: Normal rate and regular rhythm.  Pulmonary:     Effort: Pulmonary effort is normal.  Abdominal:     General: Bowel sounds are normal.  Musculoskeletal:     Comments: + upper extremity contractures    Neurological:     Comments: + generalized confusion and RUE contractures    Psychiatric:        Mood and Affect: Mood normal.   Disposition: Status is: Inpatient   Time spent: 46 minutes   Data Reviewed: I reviewed patient's CT of the brain that did not show any acute intracranial pathology    Latest Ref Rng & Units 02/09/2023    4:26 AM 02/08/2023  7:24 AM 02/07/2023    3:19 AM  BMP  Glucose 70 - 99 mg/dL 657  89  83   BUN 8 - 23 mg/dL 9  9  16    Creatinine 0.44 - 1.00 mg/dL 8.46  <9.62  9.52   Sodium 135 - 145 mmol/L 134  136  135   Potassium 3.5 - 5.1 mmol/L 3.9  3.5  3.6   Chloride 98 - 111 mmol/L 105  104  103   CO2 22 - 32 mmol/L 25  23  24    Calcium 8.9 - 10.3 mg/dL 8.0  8.2   8.0        Latest Ref Rng & Units 02/09/2023    4:26 AM 02/08/2023    7:24 AM 02/07/2023    3:19 AM  CBC  WBC 4.0 - 10.5 K/uL 5.8  5.2  7.0   Hemoglobin 12.0 - 15.0 g/dL 9.3  9.6  8.6   Hematocrit 36.0 - 46.0 % 31.4  32.1  28.2   Platelets 150 - 400 K/uL 358  370  350     Vitals:   02/08/23 2000 02/09/23 0441 02/09/23 0822 02/09/23 1524  BP: (!) 108/59 123/70 (!) 124/57 (!) 109/48  Pulse: 80 79 78 86  Resp: 18 20 20 17   Temp: 98.3 F (36.8 C) 98.2 F (36.8 C) 97.9 F (36.6 C) 98 F (36.7 C)  TempSrc: Oral Oral Oral Oral  SpO2: 93% 100% 98% 96%  Weight:      Height:         Author: Loyce Dys, MD 02/09/2023 4:17 PM  For on call review [email or web address].

## 2023-02-09 NOTE — Progress Notes (Signed)
Mobility Specialist - Progress Note   02/09/23 1600  Mobility  Activity Transferred from chair to bed  Level of Assistance Dependent, patient does less than 25%  Activity Response Tolerated well  $Mobility charge 1 Mobility     +2 assist to slide transfer chair-bed. TotalA for peri-care d/t BM. Alarm set, needs in reach.    Filiberto Pinks Mobility Specialist 02/09/23, 4:23 PM

## 2023-02-09 NOTE — Progress Notes (Signed)
PT Cancellation Note  Patient Details Name: ALVAH DUTKIEWICZ MRN: [id number] DOB: December 10, 1955   Cancelled Treatment:    Reason Eval/Treat Not Completed: Other (comment) PT orders received, chart reviewed. Attempted to see pt for PT tx with pt received in room, NT assisting pt with eating lunch. Will f/u as able.  Aleda Grana, PT, DPT 02/09/23, 2:00 PM   Sandi Mariscal 02/09/2023, 1:59 PM

## 2023-02-09 NOTE — TOC CM/SW Note (Signed)
CSW acknowledges consult for home health/DME needs. CSW sent secure chat to MD requesting PT and OT consults.  Charlynn Court, CSW [phone]

## 2023-02-10 DIAGNOSIS — N390 Urinary tract infection, site not specified: Secondary | ICD-10-CM | POA: Diagnosis not present

## 2023-02-10 DIAGNOSIS — R4182 Altered mental status, unspecified: Secondary | ICD-10-CM | POA: Diagnosis not present

## 2023-02-10 DIAGNOSIS — G9340 Encephalopathy, unspecified: Secondary | ICD-10-CM | POA: Diagnosis not present

## 2023-02-10 LAB — CBC WITH DIFFERENTIAL/PLATELET
Abs Immature Granulocytes: 0.04 10*3/uL (ref 0.00–0.07)
Basophils Absolute: 0.1 10*3/uL (ref 0.0–0.1)
Basophils Relative: 1 %
Eosinophils Absolute: 0.3 10*3/uL (ref 0.0–0.5)
Eosinophils Relative: 5 %
HCT: 32.2 % — ABNORMAL LOW (ref 36.0–46.0)
Hemoglobin: 9.6 g/dL — ABNORMAL LOW (ref 12.0–15.0)
Immature Granulocytes: 1 %
Lymphocytes Relative: 14 %
Lymphs Abs: 0.7 10*3/uL (ref 0.7–4.0)
MCH: 27.9 pg (ref 26.0–34.0)
MCHC: 29.8 g/dL — ABNORMAL LOW (ref 30.0–36.0)
MCV: 93.6 fL (ref 80.0–100.0)
Monocytes Absolute: 0.7 10*3/uL (ref 0.1–1.0)
Monocytes Relative: 13 %
Neutro Abs: 3.4 10*3/uL (ref 1.7–7.7)
Neutrophils Relative %: 66 %
Platelets: 364 10*3/uL (ref 150–400)
RBC: 3.44 MIL/uL — ABNORMAL LOW (ref 3.87–5.11)
RDW: 24.9 % — ABNORMAL HIGH (ref 11.5–15.5)
Smear Review: NORMAL
WBC: 5.2 10*3/uL (ref 4.0–10.5)
nRBC: 0 % (ref 0.0–0.2)

## 2023-02-10 LAB — CULTURE, BLOOD (ROUTINE X 2)

## 2023-02-10 LAB — BASIC METABOLIC PANEL
Anion gap: 5 (ref 5–15)
BUN: 9 mg/dL (ref 8–23)
CO2: 27 mmol/L (ref 22–32)
Calcium: 8.3 mg/dL — ABNORMAL LOW (ref 8.9–10.3)
Chloride: 102 mmol/L (ref 98–111)
Creatinine, Ser: 0.41 mg/dL — ABNORMAL LOW (ref 0.44–1.00)
GFR, Estimated: >60 mL/min (ref >60)
Glucose, Bld: 108 mg/dL — ABNORMAL HIGH (ref 70–99)
Potassium: 3.9 mmol/L (ref 3.5–5.1)
Sodium: 134 mmol/L — ABNORMAL LOW (ref 135–145)

## 2023-02-10 LAB — LEGIONELLA PNEUMOPHILA SEROGP 1 UR AG: L. pneumophila Serogp 1 Ur Ag: NEGATIVE

## 2023-02-10 MED ORDER — ACETAMINOPHEN 325 MG PO TABS
650.0000 mg | ORAL_TABLET | Freq: Four times a day (QID) | ORAL | Status: DC | PRN
  Administered 2023-02-10 – 2023-02-11 (×3): 650 mg via ORAL
  Filled 2023-02-10 (×3): qty 2

## 2023-02-10 NOTE — Progress Notes (Signed)
Mobility Specialist - Progress Note   02/10/23 1200  Mobility  Activity Stood at bedside;Transferred from bed to chair  Level of Assistance Moderate assist, patient does 50-74%  Assistive Device Other (Comment) (BUE support)  Distance Ambulated (ft) 2 ft  Activity Response Tolerated well  $Mobility charge 1 Mobility     Pt lying in bed upon arrival, utilizing RA. Pt agreeable to activity. AO to self, location, and used board to tell me today's date. Completed bed mobility with maxA; CGA for balance. Initial lateral lean, but use of bed rails to maintain midline position in sitting. STS and stand-pivot with mod-maxA +2. Pt left in chair with alarm set, needs in reach. Bed linen changed and new gown provided d/t soiled sheets. RN notified.   Michele James Mobility Specialist 02/10/23, 12:17 PM

## 2023-02-10 NOTE — Progress Notes (Signed)
Progress Note   Patient: Michele James ZOX:[id number] DOB: 01/11/1956 DOA: 02/06/2023     4 DOS: the patient was seen and examined on 02/10/2023  Subjective: I have discussed with patient's caretaker and mental status is almost at baseline however still off from her baseline She denies nausea vomiting abdominal pain chest pain  Brief hospital course: From HPI "MYSTICA CEDERQUIST is a 67 y.o. 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  Limited history in setting of cephalopathy.  Per report, patient found to be confused by caretaker.  Has had cloudy malodorous urine for the past 3 days.  Per report, patient usually alert and oriented.  But only oriented to self.  No chest pain or shortness of breath.  No nausea or vomiting.  No reported fevers or chills.  No reported abdominal pain or diarrhea. Presented to the ER afebrile, hemodynamically stable.  Satting well on room air.  White count 7.3, hemoglobin 9.9, platelets 428, VBG stable, ammonia 36, lactate 1.5, urinalysis nitrite and leukocyte positive.  Creatinine 0.41.  Glucose 100.  Chest x-ray with ill-defined right lung base opacity concerning for pneumonia.  CT head stable.  "     Assessment and Plan: UTI (urinary tract infection) Secondary to procidentia stuartii Urinalysis indicative of infection with noted acute worsening confusion Positive malodorous urine x 3 days Continue antibiotics    Staphylococcal species bacteremia Blood cultures were discussed with pharmacist and looking at patient's condition this is thought to be due to contaminant as vitals and WBC remained stable Repeat cultures have been requested  Acute metabolic encephalopathy-improved Worsening generalized confusion in setting of baseline cerebral palsy,  schizophrenia, seizure disorder, history of CVA with noted UTI and pneumonia According to caretaker patient is almost at baseline although a little bit off CT head within normal No reported seizure activity  Continue current antibiotics Continue neurochecks     Community-acquired pneumonia Chest imaging with Ill-defined opacity within the left lung base in setting of encephalopathy and concurrent UTI  No hypoxia at present Continue antibiotics     Schizophreniform disorder (HCC) Baseline schizophrenia  Titrate medication including zyprexa and paxil in setting of encephalopathy      Seizure (HCC) No reported seizure activity Continue home dose of tegretol       Hypothyroidism (acquired) Continue Synthroid   GERD (gastroesophageal reflux disease) PPI   Cerebral palsy (HCC) history of cerebral palsy with multiple contractures       Advance Care Planning:   Code Status: Full Code    Consults: None    Family Communication: No family at the bedside        Physical Exam: General: Confusion much improved today HENT:     Head: Normocephalic.     Mouth/Throat:     Mouth: Mucous membranes are moist.  Eyes:     Pupils: Pupils are equal, round, and reactive to light.  Cardiovascular:     Rate and Rhythm: Normal rate and regular rhythm.  Pulmonary:     Effort: Pulmonary effort is normal.  Abdominal:     General: Bowel sounds are normal.  Musculoskeletal:     Comments: + upper extremity contractures    Neurological:     Comments: Confusion improving Psychiatric:        Mood and Affect: Mood normal.   Disposition: Status is: Inpatient   Time spent: 41 minutes  Data Reviewed: I reviewed patient's CT of the brain that did not show any acute intracranial pathology     Latest Ref Rng & Units 02/10/2023    4:10 AM 02/09/2023    4:26 AM 02/08/2023    7:24 AM  CBC  WBC 4.0 - 10.5 K/uL 5.2  5.8  5.2   Hemoglobin 12.0 - 15.0 g/dL 9.6  9.3  9.6   Hematocrit  36.0 - 46.0 % 32.2  31.4  32.1   Platelets 150 - 400 K/uL 364  358  370        Latest Ref Rng & Units 02/10/2023    4:10 AM 02/09/2023    4:26 AM 02/08/2023    7:24 AM  BMP  Glucose 70 - 99 mg/dL 010  272  89   BUN 8 - 23 mg/dL 9  9  9    Creatinine 0.44 - 1.00 mg/dL 5.36  6.44  <0.34   Sodium 135 - 145 mmol/L 134  134  136   Potassium 3.5 - 5.1 mmol/L 3.9  3.9  3.5   Chloride 98 - 111 mmol/L 102  105  104   CO2 22 - 32 mmol/L 27  25  23    Calcium 8.9 - 10.3 mg/dL 8.3  8.0  8.2     Vitals:   02/09/23 1524 02/09/23 2011 02/10/23 0412 02/10/23 0725  BP: (!) 109/48 (!) 109/51 97/65 117/68  Pulse: 86 90 83 81  Resp: 17 18 20 16   Temp: 98 F (36.7 C) 98.6 F (37 C) 98.6 F (37 C) 98 F (36.7 C)  TempSrc: Oral Oral Oral Oral  SpO2: 96% 90% 97% (!) 89%  Weight:      Height:        Author: Loyce Dys, MD 02/10/2023 2:17 PM  For on call review [email or web address].

## 2023-02-10 NOTE — Evaluation (Signed)
Physical Therapy Evaluation Patient Details Name: Michele James MRN: [id number] DOB: [birth year] Today's Date: 02/10/2023  History of Present Illness  Michele James is a 67 y.o. 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    Clinical Impression  Pt alert, tangential speech noted throughout with some perseveration. Pt with more difficulty providing accurate PLOF due to this, per OT evaluation Pt stated that "she requires "a lot" of help with transfers, has not stood in years per patient report. Uses manual w/c for mobility around the house; states that she is able to mobilize the w/c herself with her R arm. Caretaker assists for showering, dressing, toilet transfers at baseline. grooming and eating from w/c level. Pt reports using crutches to t/f from w/c to toilet. Pt has LUE and BLE contracture at baseline."  The pt was up in the recliner upon PT entrance. Unable to anteriorly lean or scoot forward on recliner or EOB in prep for transfer. MaxAx2 to squat pivot to EOB, very limited weight bearing in BLE. MaxAx2 to reposition back in bed as well, noted to be very stiff throughout her body/remained very flexed. Repositioned with needs in reach.  Overall the patient demonstrated deficits (see "PT Problem List") that impede the patient's functional abilities, safety, and mobility and would benefit from skilled PT intervention.         If plan is discharge home, recommend the following: Two people to help with walking and/or transfers;Two people to help with bathing/dressing/bathroom;Direct supervision/assist for medications management;Help with stairs or ramp for entrance;Assist for transportation;Assistance with feeding;Direct supervision/assist for financial management;Assistance with  cooking/housework;Supervision due to cognitive status   Can travel by private vehicle   No    Equipment Recommendations Other (comment) (TBD)  Recommendations for Other Services       Functional Status Assessment Patient has had a recent decline in their functional status and demonstrates the ability to make significant improvements in function in a reasonable and predictable amount of time.     Precautions / Restrictions Precautions Precautions: Fall Precaution Comments: limb tone/contractures Restrictions Weight Bearing Restrictions: No      Mobility  Bed Mobility   Bed Mobility: Sit to Supine       Sit to supine: Max assist, +2 for physical assistance        Transfers Overall transfer level: Needs assistance Equipment used: 2 person hand held assist Transfers: Bed to chair/wheelchair/BSC       Squat pivot transfers: Max assist, +2 physical assistance          Ambulation/Gait                  Stairs            Wheelchair Mobility     Tilt Bed    Modified Rankin (Stroke Patients Only)       Balance Overall balance assessment: Needs assistance Sitting-balance support: No upper extremity supported, Feet supported Sitting balance-Leahy Scale: Poor Sitting balance - Comments: pt unable to sit forward in recliner without assistance or reposition in EOB without assistance     Standing balance-Leahy Scale: Zero                               Pertinent Vitals/Pain Pain Assessment Pain Assessment: Faces Faces Pain Scale: Hurts little more Pain Location:  generalized once returned to bed Pain Descriptors / Indicators: Grimacing, Guarding, Moaning Pain Intervention(s): Limited activity within patient's tolerance, Monitored during session, Repositioned    Home Living Family/patient expects to be discharged to:: Private residence Living Arrangements: Parent Available Help at Discharge: Family;Personal care attendant Type  of Home: House Home Access: Ramped entrance       Home Layout: One level Home Equipment: Shower seat;Wheelchair - manual;Crutches      Prior Function Prior Level of Function : Needs assist             Mobility Comments: Pt states that she requires "a lot" of help with transfers, has not stood in years per patient report. Uses manual w/c for mobility around the house; states that she is able to mobilize the w/c herself with her R arm. ADLs Comments: Caretaker assists for showering, dressing, toilet transfers at baseline. grooming and eating from w/c level. Pt reports using crutches to t/f from w/c to toilet. Pt has LUE and BLE contracture at baseline.     Extremity/Trunk Assessment   Upper Extremity Assessment Upper Extremity Assessment: Defer to OT evaluation    Lower Extremity Assessment Lower Extremity Assessment: RLE deficits/detail;LLE deficits/detail RLE Deficits / Details: able to move against gravity LLE Deficits / Details: LLE contracture, hip IR, and ankle IR       Communication      Cognition Arousal: Alert Behavior During Therapy: WFL for tasks assessed/performed Overall Cognitive Status: History of cognitive impairments - at baseline                                 General Comments: tangential speech t/o session        General Comments      Exercises     Assessment/Plan    PT Assessment Patient needs continued PT services  PT Problem List Decreased strength;Decreased range of motion;Decreased activity tolerance;Decreased balance;Decreased mobility;Decreased safety awareness;Decreased knowledge of use of DME;Decreased coordination       PT Treatment Interventions DME instruction;Neuromuscular re-education;Patient/family education;Functional mobility training;Wheelchair mobility training;Therapeutic activities;Therapeutic exercise;Balance training    PT Goals (Current goals can be found in the Care Plan section)  Acute Rehab PT  Goals Patient Stated Goal: to go home PT Goal Formulation: With patient Time For Goal Achievement: 02/24/23 Potential to Achieve Goals: Fair    Frequency Min 1X/week     Co-evaluation               AM-PAC PT "6 Clicks" Mobility  Outcome Measure Help needed turning from your back to your side while in a flat bed without using bedrails?: Total Help needed moving from lying on your back to sitting on the side of a flat bed without using bedrails?: Total Help needed moving to and from a bed to a chair (including a wheelchair)?: Total Help needed standing up from a chair using your arms (e.g., wheelchair or bedside chair)?: Total Help needed to walk in hospital room?: Total Help needed climbing 3-5 steps with a railing? : Total 6 Click Score: 6    End of Session Equipment Utilized During Treatment: Gait belt Activity Tolerance: Patient tolerated treatment well Patient left: in bed;with call bell/phone within reach;with bed alarm set Nurse Communication: Mobility status PT Visit Diagnosis: Other abnormalities of gait and mobility (R26.89);Difficulty in walking, not elsewhere classified (R26.2);Muscle weakness (generalized) (M62.81)    Time: 1610-9604 PT Time Calculation (min) (ACUTE ONLY): 9 min  Charges:   PT Evaluation $PT Eval Moderate Complexity: 1 Mod   PT General Charges $$ ACUTE PT VISIT: 1 Visit         Olga Coaster PT, DPT 2:53 PM,02/10/23

## 2023-02-10 NOTE — TOC Initial Note (Signed)
Transition of Care [facility]) - Initial/Assessment Note    Patient Details  Name: Michele James MRN: [id number] Date of Birth: 10/10/1955  [facility] CM/SW Contact:    Margarito Liner, LCSW Phone Number: 02/10/2023, 3:08 PM  Clinical Narrative: Patient not fully oriented. CSW called and spoke to mother. CSW introduced role and explained that PT recommendations would be discussed. Mother prefers for her to return home but wants to discuss with patient's father. CSW will try calling again later or in the morning to discuss with father. No further concerns.                 Expected Discharge Plan:  (TBD) Barriers to Discharge: Continued Medical Work up   Patient Goals and CMS Choice            Expected Discharge Plan and Services     Post Acute Care Choice:  (TBD) Living arrangements for the past 2 months: Single Family Home                                      Prior Living Arrangements/Services Living arrangements for the past 2 months: Single Family Home Lives with:: Parents Patient language and need for interpreter reviewed:: Yes Do you feel safe going back to the place where you live?: Yes      Need for Family Participation in Patient Care: Yes (Comment) Care giver support system in place?: Yes (comment)   Criminal Activity/Legal Involvement Pertinent to Current Situation/Hospitalization: No - Comment as needed  Activities of Daily Living      Permission Sought/Granted Permission sought to share information with : Family Supports    Share Information with NAME: Chrissie Noa and Latoysha Wishard     Permission granted to share info w Relationship: Mother  Permission granted to share info w Contact Information: [phone]  Emotional Assessment       Orientation: : Oriented to Self, Oriented to Place Alcohol / Substance Use: Not Applicable Psych Involvement: No (comment)  Admission diagnosis:  UTI (urinary tract infection) [N39.0] Urinary tract  infection, acute [N39.0] Altered mental status, unspecified altered mental status type [R41.82] Encephalopathy, unspecified type [G93.40] Patient Active Problem List   Diagnosis Date Noted   UTI (urinary tract infection) 02/06/2023   Acute encephalopathy 02/06/2023   Diarrhea 10/21/2022   Elevated brain natriuretic peptide (BNP) level 10/21/2022   Acute respiratory failure with hypoxia (HCC) 10/19/2022   Lactic acidosis 10/19/2022   Pneumonia 10/18/2022   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   Melena 10/18/2022   Lung nodule 04/18/2022   Symptomatic anemia 04/18/2022   Iron deficiency anemia due to chronic blood loss 04/18/2022   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   Visual hallucinations 03/18/2021   Type 2 diabetes mellitus without complication, without long-term current use of insulin (HCC) 12/23/2017   Calculus of common duct    Elevated bilirubin    Dilated cbd, acquired    Cholecystitis with cholelithiasis 11/15/2015   RUQ pain    Cerebral palsy (HCC) 11/08/2014   Osteoporosis, 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   Hypothyroidism (acquired) 05/08/2014   Depression 10/17/2013   GERD (gastroesophageal reflux disease) 10/17/2013   Seizure (HCC) 10/17/2013   PMB (postmenopausal bleeding) 07/07/2013   Unspecified glaucoma 04/01/1999   Dysphagia 04/01/1999   PCP:  Dorothey Baseman, MD Pharmacy:   [id number] [place],  [place] - [address] AT [facility] [address] [place] [address] Phone: [phone] Fax: [phone]  [id number] - [place], [place] - [address] [place] [address] Phone: [phone] Fax: [phone]     Social Determinants of Health (SDOH) Social History: SDOH Screenings   Food Insecurity: Patient Unable To Answer (02/06/2023)  Housing: Patient Unable To Answer (02/06/2023)   Transportation Needs: Patient Unable To Answer (02/06/2023)  Utilities: Patient Unable To Answer (02/06/2023)  Depression (PHQ2-9): Low Risk  (02/03/2022)  Financial Resource Strain: Low Risk  (10/01/2022)   Received from [facility]  Tobacco Use: Medium Risk (02/06/2023)   SDOH Interventions:     Readmission Risk Interventions     No data to display

## 2023-02-10 NOTE — Progress Notes (Signed)
PT Cancellation Note  Patient Details Name: Michele James MRN: [id number] DOB: May 25, 1955   Cancelled Treatment:    Reason Eval/Treat Not Completed: Other (comment). Pt eating lunch, PT to re-attempt as able.    Olga Coaster PT, DPT 1:56 PM,02/10/23

## 2023-02-10 NOTE — Care Management Important Message (Signed)
Important Message  Patient Details  Name: Michele James MRN: [id number] Date of Birth: [birth year]   Important Message Given:  Yes - Medicare IM     Olegario Messier A Henrry Feil 02/10/2023, 10:47 AM

## 2023-02-11 ENCOUNTER — Inpatient Hospital Stay: Payer: Medicare Other

## 2023-02-11 DIAGNOSIS — N390 Urinary tract infection, site not specified: Secondary | ICD-10-CM | POA: Diagnosis not present

## 2023-02-11 DIAGNOSIS — G9340 Encephalopathy, unspecified: Secondary | ICD-10-CM | POA: Diagnosis not present

## 2023-02-11 DIAGNOSIS — R569 Unspecified convulsions: Secondary | ICD-10-CM

## 2023-02-11 DIAGNOSIS — G809 Cerebral palsy, unspecified: Secondary | ICD-10-CM | POA: Diagnosis not present

## 2023-02-11 DIAGNOSIS — K219 Gastro-esophageal reflux disease without esophagitis: Secondary | ICD-10-CM

## 2023-02-11 DIAGNOSIS — E039 Hypothyroidism, unspecified: Secondary | ICD-10-CM | POA: Diagnosis not present

## 2023-02-11 DIAGNOSIS — L8990 Pressure ulcer of unspecified site, unspecified stage: Secondary | ICD-10-CM | POA: Insufficient documentation

## 2023-02-11 LAB — CBC WITH DIFFERENTIAL/PLATELET
Abs Immature Granulocytes: 0.03 10*3/uL (ref 0.00–0.07)
Basophils Absolute: 0.1 10*3/uL (ref 0.0–0.1)
Basophils Relative: 1 %
Eosinophils Absolute: 0.3 10*3/uL (ref 0.0–0.5)
Eosinophils Relative: 4 %
HCT: 35.5 % — ABNORMAL LOW (ref 36.0–46.0)
Hemoglobin: 10.6 g/dL — ABNORMAL LOW (ref 12.0–15.0)
Immature Granulocytes: 1 %
Lymphocytes Relative: 10 %
Lymphs Abs: 0.6 10*3/uL — ABNORMAL LOW (ref 0.7–4.0)
MCH: 28.3 pg (ref 26.0–34.0)
MCHC: 29.9 g/dL — ABNORMAL LOW (ref 30.0–36.0)
MCV: 94.7 fL (ref 80.0–100.0)
Monocytes Absolute: 0.7 10*3/uL (ref 0.1–1.0)
Monocytes Relative: 12 %
Neutro Abs: 4.1 10*3/uL (ref 1.7–7.7)
Neutrophils Relative %: 72 %
Platelets: 380 10*3/uL (ref 150–400)
RBC: 3.75 MIL/uL — ABNORMAL LOW (ref 3.87–5.11)
RDW: 24.9 % — ABNORMAL HIGH (ref 11.5–15.5)
Smear Review: NORMAL
WBC: 5.7 10*3/uL (ref 4.0–10.5)
nRBC: 0 % (ref 0.0–0.2)

## 2023-02-11 LAB — BASIC METABOLIC PANEL
Anion gap: 5 (ref 5–15)
BUN: 7 mg/dL — ABNORMAL LOW (ref 8–23)
CO2: 27 mmol/L (ref 22–32)
Calcium: 8.6 mg/dL — ABNORMAL LOW (ref 8.9–10.3)
Chloride: 102 mmol/L (ref 98–111)
Creatinine, Ser: 0.44 mg/dL (ref 0.44–1.00)
GFR, Estimated: >60 mL/min (ref >60)
Glucose, Bld: 106 mg/dL — ABNORMAL HIGH (ref 70–99)
Potassium: 4.1 mmol/L (ref 3.5–5.1)
Sodium: 134 mmol/L — ABNORMAL LOW (ref 135–145)

## 2023-02-11 LAB — CULTURE, BLOOD (ROUTINE X 2)
Culture  Setup Time: NONE SEEN
Culture: NO GROWTH
Special Requests: ADEQUATE

## 2023-02-11 NOTE — TOC Transition Note (Addendum)
Transition of Care [facility]) - CM/SW Discharge Note   Patient Details  Name: Michele James MRN: [id number] Date of Birth: Sep 11, 1955  Transition of Care [facility]) CM/SW Contact:  Margarito Liner, LCSW Phone Number: 02/11/2023, 1:12 PM   Clinical Narrative:   Patient has orders to discharge home today. No further concerns. CSW signing off.  1:47 pm: Per RN, father requesting EMS transport home. CSW called and spoke to mother who confirmed address on facesheet is correct. CSW notified her that insurance may not cover transport from hospital to home. [employer] no longer supplies her home oxygen and had previously picked it up on 9/23. Per RN, patient has been on room air for 30 minutes and is satting at 95%. EMS transport set up for 2:30. No further concerns. CSW signing off.  Final next level of care: Home/Self Care Barriers to Discharge: Barriers Resolved   Patient Goals and CMS Choice      Discharge Placement                    Name of family member notified: Daisy Larusso Patient and family notified of of transfer: 02/11/23  Discharge Plan and Services Additional resources added to the After Visit Summary for       Post Acute Care Choice:  (TBD)                               Social Determinants of Health (SDOH) Interventions SDOH Screenings   Food Insecurity: Patient Unable To Answer (02/06/2023)  Housing: Patient Unable To Answer (02/06/2023)  Transportation Needs: Patient Unable To Answer (02/06/2023)  Utilities: Patient Unable To Answer (02/06/2023)  Depression (PHQ2-9): Low Risk  (02/03/2022)  Financial Resource Strain: Low Risk  (10/01/2022)   Received from [facility], [facility]  Tobacco Use: Medium Risk (02/06/2023)     Readmission Risk Interventions     No data to display

## 2023-02-11 NOTE — Progress Notes (Signed)
Mobility Specialist - Progress Note   02/11/23 1200  Mobility  Activity Dangled on edge of bed;Transferred from bed to chair  Level of Assistance Maximum assist, patient does 25-49%  Activity Response Tolerated well  $Mobility charge 1 Mobility     Pt lying in bed upon arrival, utilizing RA. Pt agreeable to activity. Reporting headache. Completed bed mobility with maxA. Able to hold sitting position at EOB with cueing + supervision. Squat pivot +2 to transfer to recliner. Alarm set, needs in reach. RN notified.   Filiberto Pinks Mobility Specialist 02/11/23, 12:12 PM

## 2023-02-11 NOTE — Discharge Summary (Signed)
Physician Discharge Summary   Patient: Michele James MRN: [id number] DOB: Sep 20, 1955  Admit date:     02/06/2023  Discharge date: 02/11/23  Discharge Physician: Arnetha Courser   PCP: Dorothey Baseman, MD   Recommendations at discharge:  Please obtain CBC and BMP on follow-up Patient's parents refused home health, please address the need on follow-up. Patient needs surveillance for developing pressure injuries Follow-up with primary care provider within a week  Discharge Diagnoses: Principal Problem:   UTI (urinary tract infection) Active Problems:   Encephalopathy   Pneumonia   Cerebral palsy (HCC)   GERD (gastroesophageal reflux disease)   Hypothyroidism (acquired)   Seizure (HCC)   Schizophreniform disorder (HCC)   Pressure injury of skin   Hospital Course: Michele James is a 67 y.o. 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  Limited history in setting of cephalopathy.  Per report, patient found to be confused by caretaker.  Has had cloudy malodorous urine for the past 3 days.  Per report, patient usually alert and oriented.  But only oriented to self.  No chest pain or shortness of breath.  No nausea or vomiting.  No reported fevers or chills.  No reported abdominal pain or diarrhea. Presented to the ER afebrile, hemodynamically stable.  Satting well on room air.  White count 7.3, hemoglobin 9.9, platelets 428, VBG stable, ammonia 36, lactate 1.5, urinalysis nitrite and leukocyte positive.  Creatinine 0.41.  Glucose 100.  Chest x-ray with ill-defined right lung base opacity concerning for pneumonia.  CT head stable.  Urine cultures grew procidentia, patient completed the course of antibiotic for concern of pneumonia and UTI.  Her mental status improved back to  baseline.  No reported seizure activity.  Likely metabolic encephalopathy due to infection.  Our physical therapist initially recommended rehab but family refused and would like to take her back home.  They also refused home health services which were ordered.  They will contact her PCP if needed.  She was also found to have 2 small pressure injuries, unstageable, care instructions were provided to the caregiver as instructed by father as they do not want anybody coming to their home.  Patient is now at baseline and is being discharged home on her home medications and she will follow-up with her providers for further management.      Consultants: None Procedures performed: None Disposition: Home Diet recommendation:  Discharge Diet Orders (From admission, onward)     Start     Ordered   02/11/23 0000  Diet - low sodium heart healthy        02/11/23 1306           Regular diet DISCHARGE MEDICATION: Allergies as of 02/11/2023       Reactions   Aminophylline Other (See Comments)   Headache   Cinnamon Other (See Comments)   Stomachache    Amoxicillin Rash   Latex Rash   Nsaids Rash   Penicillins Nausea And Vomiting   Potassium Nausea And Vomiting, Nausea Only   Potassium-containing Compounds Nausea And Vomiting   Septra [sulfamethoxazole-trimethoprim] Rash   Sulfa Antibiotics Rash   Sulfasalazine Rash   Tape Rash   Paper tape ok   Theophyllines Other (See Comments)   Headache        Medication List     TAKE these medications    acetaminophen 325 MG  tablet Commonly known as: TYLENOL Take 325 mg by mouth every 6 (six) hours as needed.   alendronate 70 MG tablet Commonly known as: FOSAMAX Take 70 mg by mouth once a week. Take with a full glass of water on an empty stomach.   ALPRAZolam 0.25 MG tablet Commonly known as: XANAX Take 0.25 mg by mouth as needed for anxiety. Before pap smears   atorvastatin 40 MG tablet Commonly known as: LIPITOR Take 40 mg  by mouth daily.   calcium citrate-vitamin D 315-200 MG-UNIT tablet Commonly known as: CITRACAL+D Take 1 tablet by mouth 2 (two) times daily.   carbamazepine 200 MG tablet Commonly known as: TEGRETOL Take 200 mg by mouth 3 (three) times daily.   Cholecalciferol 100 MCG (4000 UT) Tabs Take 1,000 Units by mouth.   esomeprazole 40 MG capsule Commonly known as: NEXIUM Take 40 mg by mouth daily at 12 noon.   famotidine 20 MG tablet Commonly known as: PEPCID Take 20 mg by mouth 2 (two) times daily.   hydroxypropyl methylcellulose / hypromellose 2.5 % ophthalmic solution Commonly known as: ISOPTO TEARS / GONIOVISC 1 drop as needed for dry eyes.   iron polysaccharides 150 MG capsule Commonly known as: NIFEREX Take 1 capsule (150 mg total) by mouth 2 (two) times daily for 30 days, THEN 1 capsule (150 mg total) daily. Start taking on: April 21, 2022   levothyroxine 100 MCG tablet Commonly known as: SYNTHROID Take 100 mcg by mouth every morning.   magnesium oxide 400 MG tablet Commonly known as: MAG-OX Take 1 tablet by mouth 3 (three) times daily.   naproxen 375 MG tablet Commonly known as: NAPROSYN Take 375 mg by mouth 2 (two) times daily with a meal.   OLANZapine 10 MG tablet Commonly known as: ZYPREXA Take 1 tablet (10 mg total) by mouth at bedtime. What changed: Another medication with the same name was removed. Continue taking this medication, and follow the directions you see here.   PARoxetine 20 MG tablet Commonly known as: PAXIL Take 1 tablet (20 mg total) by mouth daily.   potassium chloride 20 MEQ/15ML (10%) Soln Take 10 mEq by mouth daily.   vitamin E 180 MG (400 UNITS) capsule Take 400 Units by mouth daily.               Discharge Care Instructions  (From admission, onward)           Start     Ordered   02/11/23 0000  Discharge wound care:       Comments: 1.Clean R heel with NS, apply Xeroform gauze [place] [phone]) to wound bed daily.  Cover  with dry gauze, Kerlix and place foot in Prevalon boot [place] [phone]) to offload pressure to heel.   2.Apply silicone foam to L ischium, lift daily to assess area. Change foam q3 days and prn soiling.   02/11/23 1306            Follow-up Information     Dorothey Baseman, MD. Schedule an appointment as soon as possible for a visit in 1 week(s).   Specialty: Family Medicine Contact information: [address] [place] [address] [phone]                Discharge Exam: Ceasar Mons Weights   02/06/23 0729  Weight: 72 kg   General.  Patient with cerebral palsy, in no acute distress. Pulmonary.  Lungs clear bilaterally, normal respiratory effort. CV.  Regular rate and rhythm, no JVD, rub  or murmur. Abdomen.  Soft, nontender, nondistended, BS positive. CNS.  Alert and oriented .  Baseline contractures Extremities.  No edema, no cyanosis, pulses intact and symmetrical.  Condition at discharge: stable  The results of significant diagnostics from this hospitalization (including imaging, microbiology, ancillary and laboratory) are listed below for reference.   Imaging Studies: DG Chest 2 View  Result Date: 02/06/2023 CLINICAL DATA:  Provided history: Confusion (non-ambulatory). EXAM: CHEST - 2 VIEW COMPARISON:  Prior chest radiographs 10/18/2022 and earlier. Chest CT 10/18/2022. FINDINGS: The cardiomediastinal silhouette is unchanged. Aortic atherosclerosis. Moderate-sized hiatal hernia. Ill-defined opacity at the left lung base (best appreciated on the lateral view radiograph). No appreciable airspace consolidation on the right. No evidence of pleural effusion or pneumothorax. No acute osseous abnormality identified. Spondylosis and dextrocurvature of the thoracic spine. Degenerative changes of the right acromioclavicular joint. Chronic left rib fracture deformities. IMPRESSION: 1. Ill-defined opacity within the left lung base, which may reflect atelectasis or pneumonia. 2.  Moderate-sized hiatal hernia. 3. Aortic Atherosclerosis (ICD10-I70.0). Electronically Signed   By: Jackey Loge D.O.   On: 02/06/2023 10:24   CT Head Wo Contrast  Result Date: 02/06/2023 CLINICAL DATA:  Altered mental status, nontraumatic (Ped 0-17y) EXAM: CT HEAD WITHOUT CONTRAST TECHNIQUE: Contiguous axial images were obtained from the base of the skull through the vertex without intravenous contrast. RADIATION DOSE REDUCTION: This exam was performed according to the departmental dose-optimization program which includes automated exposure control, adjustment of the mA and/or kV according to patient size and/or use of iterative reconstruction technique. COMPARISON:  CT head 03/17/21 FINDINGS: Brain: No hemorrhage. No hydrocephalus. No extra-axial fluid collection. No CT evidence of an acute cortical infarct. No mass effect. No mass lesion. Vascular: No hyperdense vessel or unexpected calcification. Skull: Normal. Negative for fracture or focal lesion. Sinuses/Orbits: No middle ear or mastoid effusion. Paranasal sinuses are clear. Orbits are notable for bilateral lens replacement, otherwise unremarkable. Other: None IMPRESSION: No CT etiology for altered mental status identified Electronically Signed   By: Lorenza Cambridge M.D.   On: 02/06/2023 10:22    Microbiology: Results for orders placed or performed during the hospital encounter of 02/06/23  Urine Culture     Status: Abnormal   Collection Time: 02/06/23 10:31 AM   Specimen: Urine, Random  Result Value Ref Range Status   Specimen Description   Final    URINE, RANDOM Performed at [facility], [address], [place], [place] [address]    Special Requests   Final    NONE Reflexed from [phone] [id number] Performed at [facility], [address] [place], [place] [address]    Culture >=100,000 COLONIES/mL PROVIDENCIA STUARTII (A)  Final   Report Status 02/08/2023 FINAL  Final   Organism ID, Bacteria PROVIDENCIA STUARTII (A)  Final       Susceptibility   Providencia stuartii - MIC*    AMPICILLIN >=32 RESISTANT Resistant     CEFEPIME <=0.12 SENSITIVE Sensitive     CEFTRIAXONE <=0.25 SENSITIVE Sensitive     CIPROFLOXACIN <=0.25 SENSITIVE Sensitive     GENTAMICIN >=16 RESISTANT Resistant     IMIPENEM 2 SENSITIVE Sensitive     NITROFURANTOIN 256 RESISTANT Resistant     TRIMETH/SULFA 40 SENSITIVE Sensitive     AMPICILLIN/SULBACTAM >=32 RESISTANT Resistant     PIP/TAZO <=4 SENSITIVE Sensitive ug/mL    * >=100,000 COLONIES/mL PROVIDENCIA STUARTII  Blood Culture (routine x 2)     Status: Abnormal   Collection Time: 02/06/23 12:03 PM   Specimen:  BLOOD RIGHT ARM  Result Value Ref Range Status   Specimen Description   Final    BLOOD RIGHT ARM Performed at [facility], [address] [place], [place] [address]    Special Requests   Final    BOTTLES DRAWN AEROBIC AND ANAEROBIC Blood Culture results may not be optimal due to an excessive volume of blood received in culture bottles Performed at [facility], [address] [place], [place] [address]    Culture  Setup Time   Final    GRAM POSITIVE COCCI AEROBIC BOTTLE ONLY CRITICAL RESULT CALLED TO, READ BACK BY AND VERIFIED WITH:  JASON ROBINS AT 0500 02/07/23 JG    Culture (A)  Final    STAPHYLOCOCCUS HOMINIS STAPHYLOCOCCUS EPIDERMIDIS THE SIGNIFICANCE OF ISOLATING THIS ORGANISM FROM A SINGLE SET OF BLOOD CULTURES WHEN MULTIPLE SETS ARE DRAWN IS UNCERTAIN. PLEASE NOTIFY THE MICROBIOLOGY DEPARTMENT WITHIN ONE WEEK IF SPECIATION AND SENSITIVITIES ARE REQUIRED. Performed at [facility], [address], [place], [place] [address]    Report Status 02/10/2023 FINAL  Final  Blood Culture (routine x 2)     Status: None   Collection Time: 02/06/23 12:03 PM   Specimen: BLOOD RIGHT ARM  Result Value Ref Range Status   Specimen Description   Final    BLOOD RIGHT ARM Performed at [facility], [address] [place], [place] [address]    Special  Requests   Final    BOTTLES DRAWN AEROBIC AND ANAEROBIC Blood Culture adequate volume   Culture  Setup Time NO ORGANISMS SEEN ANAEROBIC BOTTLE ONLY   Final   Culture   Final    NO GROWTH 5 DAYS Performed at [facility], [address] [place], [place] [address]    Report Status 02/11/2023 FINAL  Final  Blood Culture ID Panel (Reflexed)     Status: Abnormal   Collection Time: 02/06/23 12:03 PM  Result Value Ref Range Status   Enterococcus faecalis NOT DETECTED NOT DETECTED Final   Enterococcus Faecium NOT DETECTED NOT DETECTED Final   Listeria monocytogenes NOT DETECTED NOT DETECTED Final   Staphylococcus species DETECTED (A) NOT DETECTED Final    Comment: CRITICAL RESULT CALLED TO, READ BACK BY AND VERIFIED WITH:  JASON ROBINS AT 0500 02/07/23 JG    Staphylococcus aureus (BCID) NOT DETECTED NOT DETECTED Final   Staphylococcus epidermidis DETECTED (A) NOT DETECTED Final    Comment: Methicillin (oxacillin) resistant coagulase negative staphylococcus. Possible blood culture contaminant (unless isolated from more than one blood culture draw or clinical case suggests pathogenicity). No antibiotic treatment is indicated for blood  culture contaminants. CRITICAL RESULT CALLED TO, READ BACK BY AND VERIFIED WITH:  JASON ROBINS AT 0500 02/07/23 JG    Staphylococcus lugdunensis NOT DETECTED NOT DETECTED Final   Streptococcus species NOT DETECTED NOT DETECTED Final   Streptococcus agalactiae NOT DETECTED NOT DETECTED Final   Streptococcus pneumoniae NOT DETECTED NOT DETECTED Final   Streptococcus pyogenes NOT DETECTED NOT DETECTED Final   A.calcoaceticus-baumannii NOT DETECTED NOT DETECTED Final   Bacteroides fragilis NOT DETECTED NOT DETECTED Final   Enterobacterales NOT DETECTED NOT DETECTED Final   Enterobacter cloacae complex NOT DETECTED NOT DETECTED Final   Escherichia coli NOT DETECTED NOT DETECTED Final   Klebsiella aerogenes NOT DETECTED NOT DETECTED Final   Klebsiella  oxytoca NOT DETECTED NOT DETECTED Final   Klebsiella pneumoniae NOT DETECTED NOT DETECTED Final   Proteus species NOT DETECTED NOT DETECTED Final   Salmonella  species NOT DETECTED NOT DETECTED Final   Serratia marcescens NOT DETECTED NOT DETECTED Final   Haemophilus influenzae NOT DETECTED NOT DETECTED Final   Neisseria meningitidis NOT DETECTED NOT DETECTED Final   Pseudomonas aeruginosa NOT DETECTED NOT DETECTED Final   Stenotrophomonas maltophilia NOT DETECTED NOT DETECTED Final   Candida albicans NOT DETECTED NOT DETECTED Final   Candida auris NOT DETECTED NOT DETECTED Final   Candida glabrata NOT DETECTED NOT DETECTED Final   Candida krusei NOT DETECTED NOT DETECTED Final   Candida parapsilosis NOT DETECTED NOT DETECTED Final   Candida tropicalis NOT DETECTED NOT DETECTED Final   Cryptococcus neoformans/gattii NOT DETECTED NOT DETECTED Final   Methicillin resistance mecA/C DETECTED (A) NOT DETECTED Final    Comment: CRITICAL RESULT CALLED TO, READ BACK BY AND VERIFIED WITH:  JASON ROBINS AT 0500 02/07/23 JG Performed at [facility], [address] [place], [place] [address]   Culture, blood (Routine X 2) w Reflex to ID Panel     Status: None (Preliminary result)   Collection Time: 02/10/23  2:57 PM   Specimen: BLOOD  Result Value Ref Range Status   Specimen Description BLOOD RIGHT ANTECUBITAL  Final   Special Requests   Final    BOTTLES DRAWN AEROBIC AND ANAEROBIC Blood Culture results may not be optimal due to an inadequate volume of blood received in culture bottles   Culture   Final    NO GROWTH < 24 HOURS Performed at [facility], [address] [place], [place] [address]    Report Status PENDING  Incomplete  Culture, blood (Routine X 2) w Reflex to ID Panel     Status: None (Preliminary result)   Collection Time: 02/10/23  4:41 PM   Specimen: BLOOD  Result Value Ref Range Status   Specimen Description BLOOD RIGHT ANTECUBITAL  Final   Special  Requests   Final    BOTTLES DRAWN AEROBIC AND ANAEROBIC Blood Culture results may not be optimal due to an inadequate volume of blood received in culture bottles   Culture   Final    NO GROWTH < 12 HOURS Performed at [facility], [address] [place], [place] [address]    Report Status PENDING  Incomplete    Labs: CBC: Recent Labs  Lab 02/07/23 0319 02/08/23 0724 02/09/23 0426 02/10/23 0410 02/11/23 0444  WBC 7.0 5.2 5.8 5.2 5.7  NEUTROABS  --  3.3 4.0 3.4 4.1  HGB 8.6* 9.6* 9.3* 9.6* 10.6*  HCT 28.2* 32.1* 31.4* 32.2* 35.5*  MCV 89.2 92.5 93.2 93.6 94.7  PLT 350 370 358 364 380   Basic Metabolic Panel: Recent Labs  Lab 02/07/23 0319 02/08/23 0724 02/09/23 0426 02/10/23 0410 02/11/23 0444  NA 135 136 134* 134* 134*  K 3.6 3.5 3.9 3.9 4.1  CL 103 104 105 102 102  CO2 24 23 25 27 27   GLUCOSE 83 89 103* 108* 106*  BUN 16 9 9 9  7*  CREATININE 0.45 <0.30* 0.46 0.41* 0.44  CALCIUM 8.0* 8.2* 8.0* 8.3* 8.6*   Liver Function Tests: Recent Labs  Lab 02/06/23 0727 02/07/23 0319  AST 15 13*  ALT 11 11  ALKPHOS 89 70  BILITOT <0.2 0.5  PROT 6.7 5.7*  ALBUMIN 3.2* 2.7*   CBG: Recent Labs  Lab 02/07/23 1957 02/08/23 0042  GLUCAP 118* 95    Discharge time spent: greater than 30 minutes.  This record has been created using Conservation officer, historic buildings. Errors have been sought  and corrected,but may not always be located. Such creation errors do not reflect on the standard of care.   Signed: Arnetha Courser, MD [facility] 02/11/2023

## 2023-02-11 NOTE — TOC Progression Note (Addendum)
Transition of Care [facility]) - Progression Note    Patient Details  Name: Michele James MRN: [id number] Date of Birth: [birth year]  [facility]) CM/SW Contact  Margarito Liner, LCSW Phone Number: 02/11/2023, 9:18 AM  Clinical Narrative:  CSW called and spoke to patient's father. He declined SNF and home health services. He will call her PCP later on if he changes his mind. He stated he provides the majority of the patient's care. He will transport her home at discharge.   12:48 pm: CSW called and spoke to father about having a HHRN coming in for wound care. Father declined. He will ask her caretaker if she can come to the hospital for education on wound care. DME recommendation for 3-in-1/BSC. Father said "she has all that."  Expected Discharge Plan:  (TBD) Barriers to Discharge: Continued Medical Work up  Expected Discharge Plan and Services     Post Acute Care Choice:  (TBD) Living arrangements for the past 2 months: Single Family Home                                       Social Determinants of Health (SDOH) Interventions SDOH Screenings   Food Insecurity: Patient Unable To Answer (02/06/2023)  Housing: Patient Unable To Answer (02/06/2023)  Transportation Needs: Patient Unable To Answer (02/06/2023)  Utilities: Patient Unable To Answer (02/06/2023)  Depression (PHQ2-9): Low Risk  (02/03/2022)  Financial Resource Strain: Low Risk  (10/01/2022)   Received from [facility], [facility]  Tobacco Use: Medium Risk (02/06/2023)    Readmission Risk Interventions     No data to display

## 2023-02-11 NOTE — Consult Note (Addendum)
WOC Nurse Consult Note: patient has been seen by podiatry 01/08/2023 for R heel wound; recommendation for antibiotic ointment and to keep pressure off area  Reason for Consult: R heel wound/L posterior thigh recommendations for wound care  Wound type: 1.  Unstageable PI R heel 2.  Stage 2 PI L ischium  Pressure Injury POA: Yes  Measurement: 1.  R heel 3 cm x 4 cm 100% soft black eschar 2.  Stage 2 L ischium 0.5 cm x 0.5 cm x 0.1 cm 100% pink moist  Wound bed: as above  Drainage (amount, consistency, odor) minimal sanguinous from R heel; minimal serosanguinous L ischium  Periwound: intact  Dressing procedure/placement/frequency: Clean R heel with NS, apply Xeroform gauze [place] [phone]) to wound bed daily.  Cover with dry gauze, Kerlix and place foot in Prevalon boot [place] [phone]) to offload pressure to heel.   Apply silicone foam to L ischium, lift daily to assess area. Change foam q3 days and prn soiling.   POC discussed with bedside nurse and primary MD.  Patient will return to care of family at home, refused home health.  Patient would benefit from follow-up for this heel with outpatient wound care center.    WOC team will not follow. Re-consult if further needs arise.   Thank you,    Priscella Mann MSN, RN-BC, CWOCN [phone]  ADDENDUM: Spoke with patients in home caregiver Diane regarding wound care. She states she had cared for foot after visit with podiatrist and feels comfortable with above wound care.  Also discussed foam dressing to L ischium until healed.  Voiced understanding.

## 2023-02-11 NOTE — Plan of Care (Signed)
  Problem: Activity: Goal: Ability to tolerate increased activity will improve Outcome: Adequate for Discharge   Problem: Clinical Measurements: Goal: Ability to maintain a body temperature in the normal range will improve Outcome: Adequate for Discharge   Problem: Respiratory: Goal: Ability to maintain adequate ventilation will improve Outcome: Adequate for Discharge Goal: Ability to maintain a clear airway will improve Outcome: Adequate for Discharge   Problem: Education: Goal: Knowledge of General Education information will improve Description: Including pain rating scale, medication(s)/side effects and non-pharmacologic comfort measures Outcome: Adequate for Discharge   Problem: Health Behavior/Discharge Planning: Goal: Ability to manage health-related needs will improve Outcome: Adequate for Discharge   Problem: Clinical Measurements: Goal: Ability to maintain clinical measurements within normal limits will improve Outcome: Adequate for Discharge Goal: Will remain free from infection Outcome: Adequate for Discharge Goal: Diagnostic test results will improve Outcome: Adequate for Discharge Goal: Respiratory complications will improve Outcome: Adequate for Discharge Goal: Cardiovascular complication will be avoided Outcome: Adequate for Discharge   Problem: Activity: Goal: Risk for activity intolerance will decrease Outcome: Adequate for Discharge   Problem: Nutrition: Goal: Adequate nutrition will be maintained Outcome: Adequate for Discharge   Problem: Coping: Goal: Level of anxiety will decrease Outcome: Adequate for Discharge   Problem: Elimination: Goal: Will not experience complications related to bowel motility Outcome: Adequate for Discharge Goal: Will not experience complications related to urinary retention Outcome: Adequate for Discharge   Problem: Pain Management: Goal: General experience of comfort will improve Outcome: Adequate for Discharge    Problem: Safety: Goal: Ability to remain free from injury will improve Outcome: Adequate for Discharge   Problem: Skin Integrity: Goal: Risk for impaired skin integrity will decrease Outcome: Adequate for Discharge   Pt being picked up by family and caregiver. All belongings and DC instructions given to caregiver/family. Pt taken down to lobby with staff via wheelchair

## 2023-02-11 NOTE — Plan of Care (Signed)

## 2023-02-11 NOTE — Progress Notes (Signed)
Mobility Specialist - Progress Note   02/11/23 1500  Mobility  Activity Transferred from chair to bed;Turned to right side;Turned to left side;Turned to back - supine  Level of Assistance Maximum assist, patient does 25-49%  Activity Response Tolerated well  Mobility Referral Yes  $Mobility charge 1 Mobility     Pt transferred bed-chair via squat-pivot +2. Reports pain in RLE. Rolled L/R for peri-care d/t small BM. Alarm set, needs in reach.    Filiberto Pinks Mobility Specialist 02/11/23, 3:02 PM

## 2023-02-11 NOTE — Progress Notes (Signed)
Occupational Therapy Treatment Patient Details Name: Michele James MRN: [id number] DOB: 03/06/1956 Today's Date: 02/11/2023   History of present illness Michele James is a 67 y.o. 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   OT comments  Upon entering the room, pt supine in bed and agreeable to OT intervention. Pt's caregiver initially in room but exits at beginning of session. She continues to have tangential speech but follows 1 step commands with increased time. Pt performing supine >sit with max A to EOB. Sitting balance of min guard- min A while pt washes face and brushes teeth  ( set up A to obtain needed items). Attempts for lateral scoot to the R towards HOB with max A of 2. Sit >supine with +2 assistance to return to bed. Call bell and all needed items within reach upon exiting the room.        If plan is discharge home, recommend the following:  Two people to help with walking and/or transfers;Two people to help with bathing/dressing/bathroom;Help with stairs or ramp for entrance;Assist for transportation;Assistance with cooking/housework   Equipment Recommendations  Other (comment) (drop arm BSC)       Precautions / Restrictions Precautions Precautions: Fall Precaution Comments: limb tone/contractures Restrictions Weight Bearing Restrictions: No       Mobility Bed Mobility Overal bed mobility: Needs Assistance Bed Mobility: Sit to Supine, Supine to Sit     Supine to sit: Max assist, +2 for physical assistance Sit to supine: Max assist, +2 for physical assistance        Transfers   Equipment used: 2 person hand held assist              Lateral/Scoot Transfers: Max assist, +2 physical assistance       Balance Overall balance assessment: Needs  assistance Sitting-balance support: No upper extremity supported, Feet supported Sitting balance-Leahy Scale: Fair                                     ADL either performed or assessed with clinical judgement   ADL Overall ADL's : Needs assistance/impaired     Grooming: Oral care;Sitting;Wash/dry face;Set up Grooming Details (indicate cue type and reason): set up A for grooming tasks but min guard- min A for balance on EOB                                    Extremity/Trunk Assessment Upper Extremity Assessment Upper Extremity Assessment: Generalized weakness            Vision Patient Visual Report: No change from baseline            Cognition Arousal: Alert Behavior During Therapy: WFL for tasks assessed/performed Overall Cognitive Status: History of cognitive impairments - at baseline                                 General Comments: tangential speech t/o session                   Pertinent Vitals/ Pain       Pain Assessment Pain Assessment: No/denies pain  Frequency  Min 1X/week        Progress Toward Goals  OT Goals(current goals can now be found in the care plan section)  Progress towards OT goals: Progressing toward goals      AM-PAC OT "6 Clicks" Daily Activity     Outcome Measure   Help from another person eating meals?: A Little Help from another person taking care of personal grooming?: A Little Help from another person toileting, which includes using toliet, bedpan, or urinal?: A Lot Help from another person bathing (including washing, rinsing, drying)?: A Lot Help from another person to put on and taking off regular upper body clothing?: A Lot Help from another person to put on and taking off regular lower body clothing?: Total 6 Click Score: 13    End of Session    OT Visit Diagnosis: Other abnormalities of gait and mobility (R26.89);Muscle weakness (generalized) (M62.81)    Activity Tolerance Patient tolerated treatment well   Patient Left with call bell/phone within reach;in bed;with bed alarm set   Nurse Communication Mobility status        Time: 1010-1027 OT Time Calculation (min): 17 min  Charges: OT General Charges $OT Visit: 1 Visit OT Treatments $Self Care/Home Management : 8-22 mins  Jackquline Denmark, MS, OTR/L , CBIS ascom [phone]  02/11/23, 11:30 AM

## 2023-02-15 LAB — CULTURE, BLOOD (ROUTINE X 2)
Culture: NO GROWTH
Culture: NO GROWTH

## 2023-02-18 ENCOUNTER — Inpatient Hospital Stay: Payer: Medicare Other

## 2023-02-18 VITALS — BP 94/66 | HR 86 | Temp 96.6°F | Resp 16

## 2023-02-18 DIAGNOSIS — D500 Iron deficiency anemia secondary to blood loss (chronic): Secondary | ICD-10-CM

## 2023-02-18 MED ORDER — IRON SUCROSE 20 MG/ML IV SOLN
200.0000 mg | Freq: Once | INTRAVENOUS | Status: AC
Start: 2023-02-18 — End: 2023-02-18
  Administered 2023-02-18: 200 mg via INTRAVENOUS
  Filled 2023-02-18: qty 10

## 2023-02-18 MED ORDER — SODIUM CHLORIDE 0.9% FLUSH
10.0000 mL | Freq: Once | INTRAVENOUS | Status: AC | PRN
  Administered 2023-02-18: 10 mL
  Filled 2023-02-18: qty 10

## 2023-02-18 NOTE — Patient Instructions (Signed)
Iron Sucrose Injection What is this medication? IRON SUCROSE (EYE ern SOO krose) treats low levels of iron (iron deficiency anemia) in people with kidney disease. Iron is a mineral that plays an important role in making red blood cells, which carry oxygen from your lungs to the rest of your body. This medicine may be used for other purposes; ask your health care provider or pharmacist if you have questions. COMMON BRAND NAME(S): Venofer What should I tell my care team before I take this medication? They need to know if you have any of these conditions: Anemia not caused by low iron levels Heart disease High levels of iron in the blood Kidney disease Liver disease An unusual or allergic reaction to iron, other medications, foods, dyes, or preservatives Pregnant or trying to get pregnant Breastfeeding How should I use this medication? This medication is for infusion into a vein. It is given in a hospital or clinic setting. Talk to your care team about the use of this medication in children. While this medication may be prescribed for children as young as 2 years for selected conditions, precautions do apply. Overdosage: If you think you have taken too much of this medicine contact a poison control center or emergency room at once. NOTE: This medicine is only for you. Do not share this medicine with others. What if I miss a dose? Keep appointments for follow-up doses. It is important not to miss your dose. Call your care team if you are unable to keep an appointment. What may interact with this medication? Do not take this medication with any of the following: Deferoxamine Dimercaprol Other iron products This medication may also interact with the following: Chloramphenicol Deferasirox This list may not describe all possible interactions. Give your health care provider a list of all the medicines, herbs, non-prescription drugs, or dietary supplements you use. Also tell them if you smoke,  drink alcohol, or use illegal drugs. Some items may interact with your medicine. What should I watch for while using this medication? Visit your care team regularly. Tell your care team if your symptoms do not start to get better or if they get worse. You may need blood work done while you are taking this medication. You may need to follow a special diet. Talk to your care team. Foods that contain iron include: whole grains/cereals, dried fruits, beans, or peas, leafy green vegetables, and organ meats (liver, kidney). What side effects may I notice from receiving this medication? Side effects that you should report to your care team as soon as possible: Allergic reactions--skin rash, itching, hives, swelling of the face, lips, tongue, or throat Low blood pressure--dizziness, feeling faint or lightheaded, blurry vision Shortness of breath Side effects that usually do not require medical attention (report to your care team if they continue or are bothersome): Flushing Headache Joint pain Muscle pain Nausea Pain, redness, or irritation at injection site This list may not describe all possible side effects. Call your doctor for medical advice about side effects. You may report side effects to FDA at 1-800-FDA-1088. Where should I keep my medication? This medication is given in a hospital or clinic. It will not be stored at home. NOTE: This sheet is a summary. It may not cover all possible information. If you have questions about this medicine, talk to your doctor, pharmacist, or health care provider.  2024 Elsevier/Gold Standard (2022-08-22 00:00:00)

## 2023-03-01 ENCOUNTER — Other Ambulatory Visit: Payer: Self-pay

## 2023-03-01 ENCOUNTER — Emergency Department
Admission: EM | Admit: 2023-03-01 | Discharge: 2023-03-01 | Disposition: A | Discharge status: Home / Self Care | Payer: Medicare Other | Attending: Emergency Medicine | Admitting: Emergency Medicine

## 2023-03-01 DIAGNOSIS — E039 Hypothyroidism, unspecified: Secondary | ICD-10-CM | POA: Diagnosis not present

## 2023-03-01 DIAGNOSIS — N3000 Acute cystitis without hematuria: Secondary | ICD-10-CM | POA: Insufficient documentation

## 2023-03-01 DIAGNOSIS — R300 Dysuria: Secondary | ICD-10-CM

## 2023-03-01 DIAGNOSIS — E119 Type 2 diabetes mellitus without complications: Secondary | ICD-10-CM | POA: Insufficient documentation

## 2023-03-01 LAB — COMPREHENSIVE METABOLIC PANEL
ALT: 12 U/L (ref 0–44)
AST: 16 U/L (ref 15–41)
Albumin: 3.1 g/dL — ABNORMAL LOW (ref 3.5–5.0)
Alkaline Phosphatase: 66 U/L (ref 38–126)
Anion gap: 8 (ref 5–15)
BUN: 14 mg/dL (ref 8–23)
CO2: 28 mmol/L (ref 22–32)
Calcium: 9 mg/dL (ref 8.9–10.3)
Chloride: 102 mmol/L (ref 98–111)
Creatinine, Ser: 0.71 mg/dL (ref 0.44–1.00)
GFR, Estimated: >60 mL/min (ref >60)
Glucose, Bld: 140 mg/dL — ABNORMAL HIGH (ref 70–99)
Potassium: 3.5 mmol/L (ref 3.5–5.1)
Sodium: 138 mmol/L (ref 135–145)
Total Bilirubin: 0.2 mg/dL (ref <1.2)
Total Protein: 6 g/dL — ABNORMAL LOW (ref 6.5–8.1)

## 2023-03-01 LAB — CBC
HCT: 34.6 % — ABNORMAL LOW (ref 36.0–46.0)
Hemoglobin: 10.4 g/dL — ABNORMAL LOW (ref 12.0–15.0)
MCH: 29.5 pg (ref 26.0–34.0)
MCHC: 30.1 g/dL (ref 30.0–36.0)
MCV: 98 fL (ref 80.0–100.0)
Platelets: 350 10*3/uL (ref 150–400)
RBC: 3.53 MIL/uL — ABNORMAL LOW (ref 3.87–5.11)
RDW: 22 % — ABNORMAL HIGH (ref 11.5–15.5)
WBC: 4.9 10*3/uL (ref 4.0–10.5)
nRBC: 0 % (ref 0.0–0.2)

## 2023-03-01 LAB — URINALYSIS, W/ REFLEX TO CULTURE (INFECTION SUSPECTED)
Bilirubin Urine: NEGATIVE
Glucose, UA: NEGATIVE mg/dL
Ketones, ur: NEGATIVE mg/dL
Nitrite: POSITIVE — AB
Protein, ur: NEGATIVE mg/dL
Specific Gravity, Urine: 1.011 (ref 1.005–1.030)
WBC, UA: >50 WBC/hpf (ref 0–5)
pH: 7 (ref 5.0–8.0)

## 2023-03-01 MED ORDER — CEFDINIR 300 MG PO CAPS: 300.0000 mg | ORAL_CAPSULE | Freq: Two times a day (BID) | ORAL | 0 refills | Status: AC

## 2023-03-01 MED ORDER — SODIUM CHLORIDE 0.9 % IV SOLN
1.0000 g | Freq: Once | INTRAVENOUS | Status: AC
  Administered 2023-03-01: 1 g via INTRAVENOUS
  Filled 2023-03-01: qty 10

## 2023-03-01 NOTE — Discharge Instructions (Signed)
Take antibiotics for the full course as prescribed. \ Thank you for choosing [place] for your health care today!  Please see your primary doctor this week for a follow up appointment.   If you have any new, worsening, or unexpected symptoms call your doctor right away or come back to the emergency department for reevaluation.  It was my pleasure to care for you today.   Daneil Dan Modesto Charon, MD

## 2023-03-01 NOTE — ED Notes (Signed)
RN to bedside to help assist pt into bed. Pt is wheelchair bound. Here for possible UTI. Pt doesn't smell of foul odorous urine when moving her. Lives at home with her elderly mother and a caregiver comes in the AM and PM to help transfer her. Pt is alert and oriented.

## 2023-03-01 NOTE — ED Provider Notes (Addendum)
[facility] Provider Note    Event Date/Time   First MD Initiated Contact with Patient 03/01/23 1527     (approximate)   History   UTI   HPI  Michele James is a 67 y.o. 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  She says it feels like wasps stinging her whenever she tries to urinate.  This has been going on for several days.  She has recurrent UTIs.  No fevers no chills.  Otherwise has been baseline per mother at bedside.  Independent Historian contributed to assessment above: Her mother buys collateral information above  External Medical Documents Reviewed: Chart summary from hospitalization 02/11/2023 for pneumonia/UTI      Physical Exam   Triage Vital Signs: ED Triage Vitals [03/01/23 1424]  Encounter Vitals Group     BP 108/65     Systolic BP Percentile      Diastolic BP Percentile      Pulse Rate 82     Resp 19     Temp 98 F (36.7 C)     Temp src      SpO2 91 %     Weight      Height      Head Circumference      Peak Flow      Pain Score 4     Pain Loc      Pain Education      Exclude from Growth Chart     Most recent vital signs: Vitals:   03/01/23 1424  BP: 108/65  Pulse: 82  Resp: 19  Temp: 98 F (36.7 C)  SpO2: 91%    General: Awake, no distress.  CV:  Good peripheral perfusion.  Resp:  Normal effort.  Abd:  No distention.  Other:  Awake alert comfortable appearing, baseline mentation per mother at bedside.  Normal hemodynamics no fever no tachycardia normal blood pressure.  Soft nontender nonrigid no distention in abdomen exam.   ED Results / Procedures / Treatments   Labs (all labs ordered are listed, but only abnormal results are displayed) Labs Reviewed  COMPREHENSIVE METABOLIC PANEL - Abnormal; Notable for  the following components:      Result Value   Glucose, Bld 140 (*)    Total Protein 6.0 (*)    Albumin 3.1 (*)    All other components within normal limits  CBC - Abnormal; Notable for the following components:   RBC 3.53 (*)    Hemoglobin 10.4 (*)    HCT 34.6 (*)    RDW 22.0 (*)    All other components within normal limits  URINALYSIS, W/ REFLEX TO CULTURE (INFECTION SUSPECTED) - Abnormal; Notable for the following components:   Color, Urine AMBER (*)    APPearance CLOUDY (*)    Hgb urine dipstick SMALL (*)    Nitrite POSITIVE (*)    Leukocytes,Ua LARGE (*)    Bacteria, UA MANY (*)    All other components within normal limits  URINE CULTURE     I ordered and reviewed the above labs they are notable for white blood cell count within normal limits, electrolytes and creatinine unremarkable   PROCEDURES:  Critical Care performed: No  Procedures   MEDICATIONS ORDERED IN ED: Medications  cefTRIAXone (ROCEPHIN) 1 g in sodium chloride 0.9 % 100 mL IVPB (has no  administration in time range)    IMPRESSION / MDM / ASSESSMENT AND PLAN / ED COURSE  I reviewed the triage vital signs and the nursing notes.                                Patient's presentation is most consistent with acute presentation with potential threat to life or bodily function.  Differential diagnosis includes, but is not limited to, urinary tract infection, intra-abdominal infection, sepsis   The patient is on the cardiac monitor to evaluate for evidence of arrhythmia and/or significant heart rate changes.  MDM:    Difficult to obtain information from this patient but fortunately her mother is here to provide collateral information, patient continues to complain about wasps stinging like sensation when she urinates, dysuria, and with mother's help it seems characteristic of her recurrent UTIs.  No fevers no chills and cognition/mentation at baseline per mother.  Vital signs normal, cell counts normal,  does not appear septic.  Will check urinalysis and give antibiotics if indeed appears infected.  Indeed, the urinalysis does reflect a urinary tract infection.  Will give first dose of IV Rocephin and then a course of cefdinir and send urine culture.  Since she does not appear septic with normal vital signs plan will be for outpatient therapy discharge.     FINAL CLINICAL IMPRESSION(S) / ED DIAGNOSES   Final diagnoses:  Dysuria  Acute cystitis without hematuria     Rx / DC Orders   ED Discharge Orders          Ordered    cefdinir (OMNICEF) 300 MG capsule  2 times daily        03/01/23 1754             Note:  This document was prepared using Dragon voice recognition software and may include unintentional dictation errors.    Pilar Jarvis, MD 03/01/23 1610    Pilar Jarvis, MD 03/01/23 [place]    Pilar Jarvis, MD 03/01/23 [id number]

## 2023-03-01 NOTE — ED Notes (Signed)
This RN with assistance of two other staff I&O straight cath patient. Pt had a BM prior, we cleaned and placed new brief. Hardly any urine. No foul smell. Will send to lab and see if they can analyze it.

## 2023-03-01 NOTE — ED Triage Notes (Addendum)
First nurse note: Pt to ED via POV from [facility]. KC reports pt has been hallucinating and seeing yellow jackets. Pt recently seen here for AMS. KC reports unable to get clean catch of urine for testing and sent pt for evaluation in ED.   KC states pt and family member poor historian and unable to tell [facility] staff when symptoms started.

## 2023-03-01 NOTE — ED Triage Notes (Addendum)
Pt comes with c/o possible UTI. Pt stats she has some yellow jackets in her pants pockets trying to sting her. Pt states no pain or burning. Pt states she has had gingerale and cranberry juice.   Pt A*OX4. Pt states no pain other than where the yellow jackets are.   Pt states she did wet her bed last night and it was a lot. Pt states she wears a brief but it was too full.

## 2023-03-03 LAB — URINE CULTURE: Culture: >=100000 — AB

## 2023-03-04 NOTE — Progress Notes (Signed)
ED Antimicrobial Stewardship Positive Culture Follow Up   Michele James is an 67 y.o. female who presented to [facility] on 03/01/2023 with a chief complaint of  Chief Complaint  Patient presents with   UTI   Recent Results (from the past 720 hour(s))  Urine Culture     Status: Abnormal   Collection Time: 02/06/23 10:31 AM   Specimen: Urine, Random  Result Value Ref Range Status   Specimen Description   Final    URINE, RANDOM Performed at [facility], [address], [place], [place] [address]    Special Requests   Final    NONE Reflexed from [id number] Performed at [facility], [address] [place], [place] [address]    Culture >=100,000 COLONIES/mL PROVIDENCIA STUARTII (A)  Final   Report Status 02/08/2023 FINAL  Final   Organism ID, Bacteria PROVIDENCIA STUARTII (A)  Final      Susceptibility   Providencia stuartii - MIC*    AMPICILLIN >=32 RESISTANT Resistant     CEFEPIME <=0.12 SENSITIVE Sensitive     CEFTRIAXONE <=0.25 SENSITIVE Sensitive     CIPROFLOXACIN <=0.25 SENSITIVE Sensitive     GENTAMICIN >=16 RESISTANT Resistant     IMIPENEM 2 SENSITIVE Sensitive     NITROFURANTOIN 256 RESISTANT Resistant     TRIMETH/SULFA 40 SENSITIVE Sensitive     AMPICILLIN/SULBACTAM >=32 RESISTANT Resistant     PIP/TAZO <=4 SENSITIVE Sensitive ug/mL    * >=100,000 COLONIES/mL PROVIDENCIA STUARTII  Blood Culture (routine x 2)     Status: Abnormal   Collection Time: 02/06/23 12:03 PM   Specimen: BLOOD RIGHT ARM  Result Value Ref Range Status   Specimen Description   Final    BLOOD RIGHT ARM Performed at [facility], [address] [place], [place] [address]    Special Requests   Final    BOTTLES DRAWN AEROBIC AND ANAEROBIC Blood Culture results may not be optimal due to an excessive volume of blood received in culture bottles Performed at [facility], [address] [place], [place] [address]    Culture  Setup Time   Final    GRAM  POSITIVE COCCI AEROBIC BOTTLE ONLY CRITICAL RESULT CALLED TO, READ BACK BY AND VERIFIED WITH:  JASON ROBINS AT 0500 02/07/23 JG    Culture (A)  Final    STAPHYLOCOCCUS HOMINIS STAPHYLOCOCCUS EPIDERMIDIS THE SIGNIFICANCE OF ISOLATING THIS ORGANISM FROM A SINGLE SET OF BLOOD CULTURES WHEN MULTIPLE SETS ARE DRAWN IS UNCERTAIN. PLEASE NOTIFY THE MICROBIOLOGY DEPARTMENT WITHIN ONE WEEK IF SPECIATION AND SENSITIVITIES ARE REQUIRED. Performed at [facility], [address] [place], [place] [address]    Report Status 02/10/2023 FINAL  Final  Blood Culture (routine x 2)     Status: None   Collection Time: 02/06/23 12:03 PM   Specimen: BLOOD RIGHT ARM  Result Value Ref Range Status   Specimen Description   Final    BLOOD RIGHT ARM Performed at [facility], [address] [place], [place] [address]    Special Requests   Final    BOTTLES DRAWN AEROBIC AND ANAEROBIC Blood Culture adequate volume   Culture  Setup Time NO ORGANISMS SEEN ANAEROBIC BOTTLE ONLY   Final   Culture   Final    NO GROWTH 5 DAYS Performed at [facility], [address] [place], [place] [address]    Report Status 02/11/2023 FINAL  Final  Blood Culture ID Panel (Reflexed)  Status: Abnormal   Collection Time: 02/06/23 12:03 PM  Result Value Ref Range Status   Enterococcus faecalis NOT DETECTED NOT DETECTED Final   Enterococcus Faecium NOT DETECTED NOT DETECTED Final   Listeria monocytogenes NOT DETECTED NOT DETECTED Final   Staphylococcus species DETECTED (A) NOT DETECTED Final    Comment: CRITICAL RESULT CALLED TO, READ BACK BY AND VERIFIED WITH:  JASON ROBINS AT 0500 02/07/23 JG    Staphylococcus aureus (BCID) NOT DETECTED NOT DETECTED Final   Staphylococcus epidermidis DETECTED (A) NOT DETECTED Final    Comment: Methicillin (oxacillin) resistant coagulase negative staphylococcus. Possible blood culture contaminant (unless isolated from more than one blood culture draw or clinical case  suggests pathogenicity). No antibiotic treatment is indicated for blood  culture contaminants. CRITICAL RESULT CALLED TO, READ BACK BY AND VERIFIED WITH:  JASON ROBINS AT 0500 02/07/23 JG    Staphylococcus lugdunensis NOT DETECTED NOT DETECTED Final   Streptococcus species NOT DETECTED NOT DETECTED Final   Streptococcus agalactiae NOT DETECTED NOT DETECTED Final   Streptococcus pneumoniae NOT DETECTED NOT DETECTED Final   Streptococcus pyogenes NOT DETECTED NOT DETECTED Final   A.calcoaceticus-baumannii NOT DETECTED NOT DETECTED Final   Bacteroides fragilis NOT DETECTED NOT DETECTED Final   Enterobacterales NOT DETECTED NOT DETECTED Final   Enterobacter cloacae complex NOT DETECTED NOT DETECTED Final   Escherichia coli NOT DETECTED NOT DETECTED Final   Klebsiella aerogenes NOT DETECTED NOT DETECTED Final   Klebsiella oxytoca NOT DETECTED NOT DETECTED Final   Klebsiella pneumoniae NOT DETECTED NOT DETECTED Final   Proteus species NOT DETECTED NOT DETECTED Final   Salmonella species NOT DETECTED NOT DETECTED Final   Serratia marcescens NOT DETECTED NOT DETECTED Final   Haemophilus influenzae NOT DETECTED NOT DETECTED Final   Neisseria meningitidis NOT DETECTED NOT DETECTED Final   Pseudomonas aeruginosa NOT DETECTED NOT DETECTED Final   Stenotrophomonas maltophilia NOT DETECTED NOT DETECTED Final   Candida albicans NOT DETECTED NOT DETECTED Final   Candida auris NOT DETECTED NOT DETECTED Final   Candida glabrata NOT DETECTED NOT DETECTED Final   Candida krusei NOT DETECTED NOT DETECTED Final   Candida parapsilosis NOT DETECTED NOT DETECTED Final   Candida tropicalis NOT DETECTED NOT DETECTED Final   Cryptococcus neoformans/gattii NOT DETECTED NOT DETECTED Final   Methicillin resistance mecA/C DETECTED (A) NOT DETECTED Final    Comment: CRITICAL RESULT CALLED TO, READ BACK BY AND VERIFIED WITH:  JASON ROBINS AT 0500 02/07/23 JG Performed at [facility], [address]  [place], [place] [address]   Culture, blood (Routine X 2) w Reflex to ID Panel     Status: None   Collection Time: 02/10/23  2:57 PM   Specimen: BLOOD  Result Value Ref Range Status   Specimen Description BLOOD RIGHT ANTECUBITAL  Final   Special Requests   Final    BOTTLES DRAWN AEROBIC AND ANAEROBIC Blood Culture results may not be optimal due to an inadequate volume of blood received in culture bottles   Culture   Final    NO GROWTH 5 DAYS Performed at [facility], [address] [place], [place] [address]    Report Status 02/15/2023 FINAL  Final  Culture, blood (Routine X 2) w Reflex to ID Panel     Status: None   Collection Time: 02/10/23  4:41 PM   Specimen: BLOOD  Result Value Ref Range Status   Specimen Description BLOOD RIGHT ANTECUBITAL  Final   Special Requests   Final  BOTTLES DRAWN AEROBIC AND ANAEROBIC Blood Culture results may not be optimal due to an inadequate volume of blood received in culture bottles   Culture   Final    NO GROWTH 5 DAYS Performed at [facility], [address] [place], [place] [address]    Report Status 02/15/2023 FINAL  Final  Urine Culture     Status: Abnormal   Collection Time: 03/01/23  3:59 PM   Specimen: Urine, Clean Catch  Result Value Ref Range Status   Specimen Description   Final    URINE, CLEAN CATCH Performed at [facility], [address] [place], [place] [address]    Special Requests   Final    NONE Performed at [facility], [address] [place], [place] [address]    Culture (A)  Final    >=100,000 COLONIES/mL KLEBSIELLA PNEUMONIAE Confirmed Extended Spectrum Beta-Lactamase Producer (ESBL).  In bloodstream infections from ESBL organisms, carbapenems are preferred over piperacillin/tazobactam. They are shown to have a lower risk of mortality.    Report Status 03/03/2023 FINAL  Final   Organism ID, Bacteria KLEBSIELLA PNEUMONIAE (A)  Final      Susceptibility   Klebsiella  pneumoniae - MIC*    AMPICILLIN >=32 RESISTANT Resistant     CEFAZOLIN >=64 RESISTANT Resistant     CEFEPIME >=32 RESISTANT Resistant     CEFTRIAXONE >=64 RESISTANT Resistant     CIPROFLOXACIN 1 RESISTANT Resistant     GENTAMICIN >=16 RESISTANT Resistant     IMIPENEM <=0.25 SENSITIVE Sensitive     NITROFURANTOIN 128 RESISTANT Resistant     TRIMETH/SULFA 40 SENSITIVE Sensitive     AMPICILLIN/SULBACTAM >=32 RESISTANT Resistant     PIP/TAZO 16 SENSITIVE Sensitive ug/mL    * >=100,000 COLONIES/mL KLEBSIELLA PNEUMONIAE   [x]  Treated with cefdinir, organism resistant to prescribed antimicrobial []  Patient discharged originally without antimicrobial agent and treatment is now indicated  New antibiotic prescription: After conversation with ED provider, will call in patient for IV antibiotic treatment   ED Provider: Dr. Fuller Plan  Comments:  12/4 @ 1140 - attempted to reach patient to inform her of urine culture, no answer. Will make 2 more attempts to reach patient.   Littie Deeds, PharmD Pharmacy Resident  03/04/2023 11:39 AM

## 2023-03-12 ENCOUNTER — Other Ambulatory Visit: Payer: Self-pay | Admitting: Psychiatry

## 2023-03-12 ENCOUNTER — Telehealth: Payer: Self-pay

## 2023-03-12 NOTE — Telephone Encounter (Signed)
pt rx had already been sent follow up appt pending

## 2023-03-12 NOTE — Telephone Encounter (Signed)
Thanks, olanzapine 10 mg has been ordered this morning as well, fyi.

## 2023-03-12 NOTE — Telephone Encounter (Signed)
The refill has been ordered as requested. Please contact the patient to schedule a follow-up visit.

## 2023-03-12 NOTE — Telephone Encounter (Signed)
tried to notify pt but no answer an d no message could be left.

## 2023-03-12 NOTE — Telephone Encounter (Signed)
received fax requesting a olanzapine. pt was last seen on 11-20-22 message has been sent to the front desk to call pt and set up a follow up appt

## 2023-03-16 NOTE — Telephone Encounter (Signed)
Patient scheduled for  February

## 2023-03-17 ENCOUNTER — Encounter: Payer: Self-pay | Admitting: Oncology

## 2023-03-18 ENCOUNTER — Other Ambulatory Visit (INDEPENDENT_AMBULATORY_CARE_PROVIDER_SITE_OTHER): Payer: Medicare Other

## 2023-03-18 ENCOUNTER — Other Ambulatory Visit (INDEPENDENT_AMBULATORY_CARE_PROVIDER_SITE_OTHER): Payer: Self-pay | Admitting: Podiatry

## 2023-03-18 DIAGNOSIS — L97413 Non-pressure chronic ulcer of right heel and midfoot with necrosis of muscle: Secondary | ICD-10-CM

## 2023-03-19 ENCOUNTER — Other Ambulatory Visit: Payer: Self-pay | Admitting: Psychiatry

## 2023-03-19 LAB — VAS US ABI WITH/WO TBI
Left ABI: 1.23
Right ABI: 1.16

## 2023-04-16 ENCOUNTER — Encounter: Payer: Self-pay | Admitting: Oncology

## 2023-04-21 ENCOUNTER — Other Ambulatory Visit: Payer: Self-pay | Admitting: Psychiatry

## 2023-04-27 ENCOUNTER — Other Ambulatory Visit: Payer: Self-pay | Admitting: Psychiatry

## 2023-05-02 NOTE — Progress Notes (Deleted)
 [facility] MD/PA/NP OP Progress Note  05/02/2023 10:08 AM Michele James  MRN:  [id number]  Chief Complaint: No chief complaint on file.  HPI: *** - she is not seen since August 2024 - she was seen by Dr. Lane, neurologist since the last viist. Tegretol  was continued, 3 hour EEG was scheduled in Dec  Visit Diagnosis: No diagnosis found.  Past Psychiatric History: Please see initial evaluation for full details. I have reviewed the history. No updates at this time.     Past Medical History:  Past Medical History:  Diagnosis Date   Anemia    Arthritis    knees   Cerebral palsy (HCC)    Depression    Diabetes mellitus without complication (HCC)    Diverticulosis    Dysphagia    Eczema    GERD (gastroesophageal reflux disease)    Glaucoma    Hemorrhoids    Hyperlipidemia    Hypothyroidism    Seizures (HCC)    none in over 10 yrs    Past Surgical History:  Procedure Laterality Date   BREAST BIOPSY Right 03/22/2020   stereo bx, ribbon clip,  FAT NECROSIS AND FIBROSIS   CATARACT EXTRACTION W/PHACO Right 02/06/2016   Procedure: CATARACT EXTRACTION PHACO AND INTRAOCULAR LENS PLACEMENT (IOC);  Surgeon: Dene Etienne, MD;  Location: [facility];  Service: Ophthalmology;  Laterality: Right;   CATARACT EXTRACTION W/PHACO Left 03/05/2016   Procedure: CATARACT EXTRACTION PHACO AND INTRAOCULAR LENS PLACEMENT (IOC);  Surgeon: Dene Etienne, MD;  Location: [facility];  Service: Ophthalmology;  Laterality: Left;   CHOLECYSTECTOMY N/A 11/16/2015   Procedure: LAPAROSCOPIC CHOLECYSTECTOMY;  Surgeon: Elgin Laurence III, MD;  Location: [facility];  Service: General;  Laterality: N/A;  attempted cholangiogram   COLONOSCOPY WITH PROPOFOL  N/A 12/25/2014   Procedure: COLONOSCOPY WITH PROPOFOL ;  Surgeon: Deward CINDERELLA Piedmont, MD;  Location: [facility];  Service: Gastroenterology;  Laterality: N/A;   COLONOSCOPY WITH PROPOFOL  N/A 04/14/2019   Procedure: COLONOSCOPY WITH PROPOFOL ;  Surgeon:  Toledo, Ladell POUR, MD;  Location: [facility] ENDOSCOPY;  Service: Gastroenterology;  Laterality: N/A;   COLONOSCOPY WITH PROPOFOL  N/A 04/20/2022   Procedure: COLONOSCOPY WITH PROPOFOL ;  Surgeon: Therisa Bi, MD;  Location: [facility] END[facility];  Service: Gastroenterology;  Laterality: N/A;   ENDOSCOPIC RETROGRADE CHOLANGIOPANCREATOGRAPHY (ERCP) WITH PROPOFOL  N/A 11/18/2015   Procedure: ENDOSCOPIC RETROGRADE CHOLANGIOPANCREATOGRAPHY (ERCP) WITH PROPOFOL ;  Surgeon: Rogelia Copping, MD;  Location: [facility] ENDOSCOPY;  Service: Endoscopy;  Laterality: N/A;   ESOPHAGOGASTRODUODENOSCOPY (EGD) WITH PROPOFOL  N/A 04/14/2019   Procedure: ESOPHAGOGASTRODUODENOSCOPY (EGD) WITH PROPOFOL ;  Surgeon: Toledo, Ladell POUR, MD;  Location: [facility] ENDOSCOPY;  Service: Gastroenterology;  Laterality: N/A;   ESOPHAGOGASTRODUODENOSCOPY (EGD) WITH PROPOFOL  N/A 04/20/2022   Procedure: ESOPHAGOGASTRODUODENOSCOPY (EGD) WITH PROPOFOL ;  Surgeon: Therisa Bi, MD;  Location: [facility] ENDOSCOPY;  Service: Gastroenterology;  Laterality: N/A;   EYE SURGERY     Cataract Surgery    HERNIA REPAIR      Family Psychiatric History: Please see initial evaluation for full details. I have reviewed the history. No updates at this time.     Family History:  Family History  Problem Relation Age of Onset   High blood pressure Mother    Hyperlipidemia Mother    Diabetes Mellitus II Mother    Stroke Mother    Osteoporosis Mother    High blood pressure Father    Breast cancer Maternal Aunt    Stroke Maternal Grandfather    Diabetes Mellitus II Maternal Grandfather    Stroke Maternal Grandmother  Diabetes Mellitus II Maternal Grandmother     Social History:  Social History   Socioeconomic History   Marital status: Single    Spouse name: Not on file   Number of children: Not on file   Years of education: Not on file   Highest education level: Not on file  Occupational History   Not on file  Tobacco Use   Smoking status: Former    Current packs/day:  0.00    Types: Cigarettes    Quit date: 06/20/1980    Years since quitting: 42.8   Smokeless tobacco: Never   Tobacco comments:    smoked socially in early 1980s (reports for 4 months)  Vaping Use   Vaping status: Never Used  Substance and Sexual Activity   Alcohol  use: No   Drug use: No   Sexual activity: Not Currently  Other Topics Concern   Not on file  Social History Narrative   Not on file   Social Drivers of Health   Financial Resource Strain: Low Risk  (10/01/2022)   [name], [facility]   Overall Financial Resource Strain (CARDIA)    Difficulty of Paying Living Expenses: Not hard at all  Food Insecurity: Patient Unable To Answer (02/06/2023)   Hunger Vital Sign    Worried About Running Out of Food in the Last Year: Patient unable to answer    Ran Out of Food in the Last Year: Patient unable to answer  Transportation Needs: Patient Unable To Answer (02/06/2023)   PRAPARE - Transportation    Lack of Transportation (Medical): Patient unable to answer    Lack of Transportation (Non-Medical): Patient unable to answer  Physical Activity: Not on file  Stress: Not on file  Social Connections: Not on file    Allergies:  Allergies  Allergen Reactions   Aminophylline Other (See Comments)    Headache   Cinnamon Other (See Comments)    Stomachache    Amoxicillin  Rash   Latex Rash   Nsaids Rash   Penicillins Nausea And Vomiting   Potassium Nausea And Vomiting and Nausea Only   Potassium-Containing Compounds Nausea And Vomiting   Septra [Sulfamethoxazole-Trimethoprim] Rash   Sulfa Antibiotics Rash   Sulfasalazine Rash   Tape Rash    Paper tape ok   Theophyllines Other (See Comments)    Headache     Metabolic Disorder Labs: No results found for: HGBA1C, MPG No results found for: PROLACTIN No results found for: CHOL, TRIG, HDL, CHOLHDL, VLDL, LDLCALC Lab Results  Component Value Date   TSH  0.686 03/17/2021   TSH 0.172 (L) 05/16/2020    Therapeutic Level Labs: No results found for: LITHIUM No results found for: VALPROATE Lab Results  Component Value Date   CBMZ 6.0 02/06/2023    Current Medications: Current Outpatient Medications  Medication Sig Dispense Refill   acetaminophen  (TYLENOL ) 325 MG tablet Take 325 mg by mouth every 6 (six) hours as needed.     alendronate  (FOSAMAX ) 70 MG tablet Take 70 mg by mouth once a week. Take with a full glass of water on an empty stomach.     ALPRAZolam  (XANAX ) 0.25 MG tablet Take 0.25 mg by mouth as needed for anxiety. Before pap smears     atorvastatin  (LIPITOR) 40 MG tablet Take 40 mg by mouth daily.     calcium  citrate-vitamin D  (CITRACAL+D) 315-200 MG-UNIT per tablet Take 1 tablet by mouth 2 (two) times daily.     carbamazepine  (  TEGRETOL ) 200 MG tablet Take 200 mg by mouth 3 (three) times daily.     Cholecalciferol  100 MCG (4000 UT) TABS Take 1,000 Units by mouth.     esomeprazole (NEXIUM) 40 MG capsule Take 40 mg by mouth daily at 12 noon.     famotidine  (PEPCID ) 20 MG tablet Take 20 mg by mouth 2 (two) times daily.     hydroxypropyl methylcellulose / hypromellose (ISOPTO TEARS / GONIOVISC) 2.5 % ophthalmic solution 1 drop as needed for dry eyes.     iron  polysaccharides (NIFEREX) 150 MG capsule Take 1 capsule (150 mg total) by mouth 2 (two) times daily for 30 days, THEN 1 capsule (150 mg total) daily. 150 capsule 0   levothyroxine  (SYNTHROID ) 100 MCG tablet Take 100 mcg by mouth every morning.     magnesium  oxide (MAG-OX) 400 MG tablet Take 1 tablet by mouth 3 (three) times daily.     naproxen (NAPROSYN) 375 MG tablet Take 375 mg by mouth 2 (two) times daily with a meal.     OLANZapine  (ZYPREXA ) 10 MG tablet Take 1 tablet (10 mg total) by mouth at bedtime. 90 tablet 0   PARoxetine  (PAXIL ) 20 MG tablet Take 1 tablet (20 mg total) by mouth daily. 90 tablet 1   potassium chloride  20 MEQ/15ML (10%) SOLN Take 10 mEq by mouth  daily.     vitamin E 400 UNIT capsule Take 400 Units by mouth daily.     No current facility-administered medications for this visit.     Musculoskeletal: Strength & Muscle Tone: within normal limits Gait & Station:  in a wheel chair Patient leans: N/A  Psychiatric Specialty Exam: Review of Systems  There were no vitals taken for this visit.There is no height or weight on file to calculate BMI.  General Appearance: {Appearance:22683} , left arm internally rotated,   Eye Contact:  {BHH EYE CONTACT:22684}  Speech:  Clear and Coherent  Volume:  Normal  Mood:  {BHH MOOD:22306}  Affect:  {Affect (PAA):22687}  Thought Process:  Disorganized  Orientation:  {BHH ORIENTATION (PAA):22689}  Thought Content: {Thought Content:22690}   Suicidal Thoughts:  {ST/HT (PAA):22692}  Homicidal Thoughts:  {ST/HT (PAA):22692}  Memory:  Immediate;   Fair  Judgement:  {Judgement (PAA):22694}  Insight:  {Insight (PAA):22695}  Psychomotor Activity:  Normal right arm- no rigidity, no tremors, normal tone, no TD   Concentration:  Concentration: Fair and Attention Span: Fair  Recall:  [employer] of Knowledge: Fair  Language: Fair  Akathisia:  No  Handed:  Right  AIMS (if indicated): {Desc; done/not:10129}  Assets:  Social Support  ADL's:  {BHH JIO'D:77709}  Cognition: {chl bhh cognition:304700322}  Sleep:  {BHH GOOD/FAIR/POOR:22877}   Screenings: GAD-7    Garment/textile Technologist Visit from 12/23/2021 in [facility]  Total GAD-7 Score 1      PHQ2-9    Flowsheet Row Office Visit from 02/03/2022 in [facility] Office Visit from 12/23/2021 in [facility] Office Visit from 11/04/2021 in [facility]  PHQ-2 Total Score 0 0 0      Flowsheet Row ED from 03/01/2023 in [facility] at [facility] ED to Hosp-Admission  (Discharged) from 02/06/2023 in [facility] from 11/22/2022 in [facility] Department at [facility]  C-SSRS RISK CATEGORY No Risk No Risk No Risk        Assessment and Plan:  ERILYN PEARMAN is a 68 y.o. year old female with a history of depression, anxiety, seizure disorder, cerebral palsy, GERD, osteoporosis , who presents for follow up appointment for below.    1. Schizophrenia, unspecified type (HCC) 2. MDD (major depressive disorder), recurrent, in partial remission (HCC) Acute stressors include: n/a Chronic stressors include: unemployment, limited care (elderly father, RN twice a week)   Transferred from [facility]/Dr. Candyce. Admitted a few times, first at age 52 for depression.  According to the chart review, her father reports history of schizophrenia. No known developmental or intellectual disability. Collateral is limited due to her elderly father, who has memory loss. (Originally on olanzapine  2.5 mg in AM, 10 mg qhs, Paxil  20 mg daily)   Exam is notable for slightly less blunt affect, and she reports overall improvement in her mood symptoms.  She continues to demonstrate irrelevant, disorganized thought process, and rumination on somatic symptoms, although she has been calmer on most recent visits including today.  Although uptitration of olanzapine  to be considered, will not do it at this time given the hallucinations is not debilitating, and to mitigate risk of drowsiness.  Will continue olanzapine  to target schizophrenia along with Paxil  for depression.    3. Seizure disorder (HCC) According to the chart review, she has a diagnosis of seizure disorder and has been on carbamazepine .  Her father reports episode of staring without responding, and it is not clear whether this is related to seizure disorder, and/or schizophrenia.  Given she does not have any follow-up with neurologist, will make this referral to establish the care.      #  cognition  Mini cog- 1/3 (unable to tell date, wrote two 12 in clock) Functional Status   IADL: Independent in the following:            Requires assistance with the following: managing finances, medications, driving ADL  Independent in the following: walking          Requires assistance with the following:bathing and hygiene, feeding, continence, grooming and toileting,  Folate, Vitamin B12 (wnl 42/2024), TSH low, fT4 0.99 05/2012) Images Head CT 02/2021 Brain: Normal anatomic configuration. Moderate periventricular white matter changes are present likely reflecting the sequela of small vessel ischemia, stable since prior examination. 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 No abnormal mass effect or midline shift. No evidence of acute intracranial hemorrhage or infarct. Ventricular size is normal. Cerebellum unremarkable. Neuropsych assessment:  Etiology: r/o vascular   No change. Her father reports some concern about memory loss. IADL is limited primarily due to her condition of cerebral palsy.  Will continue to assess as needed.      Last checked  EKG HR 71, QTc386 msec 03/2022  Lipid panels wnl 03/2022  HbA1c 5.1 03/2023      Plan Continue olanzapine  5 mg in the a.m., 10 mg at night - EKG QTc 386 msec 03/2022, hr 71 Continue Paxil  20 mg daily  Next appointment: 11/11 at 11 AM, IP Referral to neurology - on carbamazepine  200 mg TID for seizure      The patient demonstrates the following risk factors for suicide: Chronic risk factors for suicide include: psychiatric disorder of depression . Acute risk factors for suicide include: unemployment. Protective factors for this patient include: positive social support and hope for the future. Considering these factors, the overall suicide risk at this  point appears to be low. Patient is appropriate for  outpatient follow up.   Collaboration of Care: Collaboration of Care: {BH OP Collaboration of Care:21014065}  Patient/Guardian was advised Release of Information must be obtained prior to any record release in order to collaborate their care with an outside provider. Patient/Guardian was advised if they have not already done so to contact the registration department to sign all necessary forms in order for us  to release information regarding their care.   Consent: Patient/Guardian gives verbal consent for treatment and assignment of benefits for services provided during this visit. Patient/Guardian expressed understanding and agreed to proceed.    Michele Sleet, MD 05/02/2023, 10:08 AM

## 2023-05-05 ENCOUNTER — Ambulatory Visit: Payer: Medicare Other | Admitting: Psychiatry

## 2023-05-07 ENCOUNTER — Ambulatory Visit: Payer: Medicare Other | Admitting: Psychiatry

## 2023-05-07 ENCOUNTER — Ambulatory Visit: Payer: Self-pay | Admitting: Psychiatry

## 2023-05-09 NOTE — Progress Notes (Signed)
BH MD/PA/NP OP Progress Note  05/12/2023 10:01 AM Michele James  MRN:  [id number]  Chief Complaint:  Chief Complaint  Patient presents with   Follow-up   HPI:  - she is not seen since August,  - she was admitted due to metabolic encephalopathy in the context of UTI.  - she was seen by gastroenterologist Feb 2025. Colonoscopy was scheduled  - she was seen by dr. Cathie Hoops for lung nodule 12/2022  This is a follow-up appointment for schizophrenia, depression. She is a limited historian, while she is well engaged in the visit today. She states that she has been doing well.  She enjoys watching birds. There was a smell of skunk when she saw her father, moving the yard (and she perseverates on this).  She states that she has an appointment with Dr.Bronstein after this (and she perseverates on this).  She denies feeling depressed.  Although she may feel anxious at times, she denies much concern.  She denies hallucinations, paranoia.  She denies SI, HI.   Her father presents to the visit.  He states that her "mental capacity was flat" when she was brought to the hospital.  She has been coming alone in the last few weeks.  Although she cannot remember starting things, it has been getting better.  She has chronic foot ulcer, and she is getting treatment for this. Michele James has sciatic pain. Michele James told her father that somebody was in the living room, although he could not find anybody. He thinks she had some weight loss during the admission, but denies any concern about the weight.  She has been taking both on olanzapine (including the morning dose) and the paroxetine as prescribed. He denies any safety concern.    Wt Readings from Last 3 Encounters:  02/06/23 158 lb 11.7 oz (72 kg)  11/22/22 151 lb 3.2 oz (68.6 kg)  10/18/22 132 lb 4.4 oz (60 kg)      Visit Diagnosis:    ICD-10-CM   1. Schizophrenia, unspecified type (HCC)  F20.9     2. MDD (major depressive disorder), recurrent, in partial remission  (HCC)  F33.41     3. Seizure disorder (HCC)  G40.909       Past Psychiatric History: Please see initial evaluation for full details. I have reviewed the history. No updates at this time.     Past Medical History:  Past Medical History:  Diagnosis Date   Anemia    Arthritis    knees   Cerebral palsy (HCC)    Depression    Diabetes mellitus without complication (HCC)    Diverticulosis    Dysphagia    Eczema    GERD (gastroesophageal reflux disease)    Glaucoma    Hemorrhoids    Hyperlipidemia    Hypothyroidism    Seizures (HCC)    none in over 10 yrs    Past Surgical History:  Procedure Laterality Date   BREAST BIOPSY Right 03/22/2020   stereo bx, ribbon clip,  FAT NECROSIS AND FIBROSIS   CATARACT EXTRACTION W/PHACO Right 02/06/2016   Procedure: CATARACT EXTRACTION PHACO AND INTRAOCULAR LENS PLACEMENT (IOC);  Surgeon: Lockie Mola, MD;  Location: [facility];  Service: Ophthalmology;  Laterality: Right;   CATARACT EXTRACTION W/PHACO Left 03/05/2016   Procedure: CATARACT EXTRACTION PHACO AND INTRAOCULAR LENS PLACEMENT (IOC);  Surgeon: Lockie Mola, MD;  Location: [facility];  Service: Ophthalmology;  Laterality: Left;   CHOLECYSTECTOMY N/A 11/16/2015   Procedure: LAPAROSCOPIC CHOLECYSTECTOMY;  Surgeon:  Tiney Rouge III, MD;  Location: [facility];  Service: General;  Laterality: N/A;  attempted cholangiogram   COLONOSCOPY WITH PROPOFOL N/A 12/25/2014   Procedure: COLONOSCOPY WITH PROPOFOL;  Surgeon: Wallace Cullens, MD;  Location: [facility];  Service: Gastroenterology;  Laterality: N/A;   COLONOSCOPY WITH PROPOFOL N/A 04/14/2019   Procedure: COLONOSCOPY WITH PROPOFOL;  Surgeon: Toledo, Boykin Nearing, MD;  Location: [facility] END[facility];  Service: Gastroenterology;  Laterality: N/A;   COLONOSCOPY WITH PROPOFOL N/A 04/20/2022   Procedure: COLONOSCOPY WITH PROPOFOL;  Surgeon: Wyline Mood, MD;  Location: [facility] END[facility]COPY;  Service: Gastroenterology;  Laterality: N/A;    ENDOSCOPIC RETROGRADE CHOLANGIOPANCREATOGRAPHY (ERCP) WITH PROPOFOL N/A 11/18/2015   Procedure: ENDOSCOPIC RETROGRADE CHOLANGIOPANCREATOGRAPHY (ERCP) WITH PROPOFOL;  Surgeon: Midge Minium, MD;  Location: [facility] ENDOSCOPY;  Service: Endoscopy;  Laterality: N/A;   ESOPHAGOGASTRODUODENOSCOPY (EGD) WITH PROPOFOL N/A 04/14/2019   Procedure: ESOPHAGOGASTRODUODENOSCOPY (EGD) WITH PROPOFOL;  Surgeon: Toledo, Boykin Nearing, MD;  Location: [facility] END[facility];  Service: Gastroenterology;  Laterality: N/A;   ESOPHAGOGASTRODUODENOSCOPY (EGD) WITH PROPOFOL N/A 04/20/2022   Procedure: ESOPHAGOGASTRODUODENOSCOPY (EGD) WITH PROPOFOL;  Surgeon: Wyline Mood, MD;  Location[facility];  Service: Gastroenterology;  Laterality: N/A;   EYE SURGERY     Cataract Surgery    HERNIA REPAIR      Family Psychiatric History: Please see initial evaluation for full details. I have reviewed the history. No updates at this time.     Family History:  Family History  Problem Relation Age of Onset   High blood pressure Mother    Hyperlipidemia Mother    Diabetes Mellitus II Mother    Stroke Mother    Osteoporosis Mother    High blood pressure Father    Breast cancer Maternal Aunt    Stroke Maternal Grandfather    Diabetes Mellitus II Maternal Grandfather    Stroke Maternal Grandmother    Diabetes Mellitus II Maternal Grandmother     Social History:  Social History   Socioeconomic History   Marital status: Single    Spouse name: Not on file   Number of children: Not on file   Years of education: Not on file   Highest education level: Not on file  Occupational History   Not on file  Tobacco Use   Smoking status: Former    Current packs/day: 0.00    Types: Cigarettes    Quit date: 06/20/1980    Years since quitting: 42.9   Smokeless tobacco: Never   Tobacco comments:    smoked socially in early 1980s (reports for 4 months)  Vaping Use   Vaping status: Never Used  Substance and Sexual Activity   Alcohol use:  No   Drug use: No   Sexual activity: Not Currently  Other Topics Concern   Not on file  Social History Narrative   Not on file   Social Drivers of Health   Financial Resource Strain: Low Risk  (10/01/2022)   [facility]   Overall Financial Resource Strain (CARDIA)    Difficulty of Paying Living Expenses: Not hard at all  Food Insecurity: Patient Unable To Answer (02/06/2023)   Hunger Vital Sign    Worried About Running Out of Food in the Last Year: Patient unable to answer    Ran Out of Food in the Last Year: Patient unable to answer  Transportation Needs: Patient Unable To Answer (02/06/2023)   PRAPARE - Transportation    Lack of Transportation (Medical): Patient unable to answer  Lack of Transportation (Non-Medical): Patient unable to answer  Physical Activity: Not on file  Stress: Not on file  Social Connections: Not on file    Allergies:  Allergies  Allergen Reactions   Aminophylline Other (See Comments)    Headache   Cinnamon Other (See Comments)    Stomachache    Amoxicillin Rash   Latex Rash   Nsaids Rash   Penicillins Nausea And Vomiting   Potassium Nausea And Vomiting and Nausea Only   Potassium-Containing Compounds Nausea And Vomiting   Septra [Sulfamethoxazole-Trimethoprim] Rash   Sulfa Antibiotics Rash   Sulfasalazine Rash   Tape Rash    Paper tape ok   Theophyllines Other (See Comments)    Headache     Metabolic Disorder Labs: No results found for: "HGBA1C", "MPG" No results found for: "PROLACTIN" No results found for: "CHOL", "TRIG", "HDL", "CHOLHDL", "VLDL", "LDLCALC" Lab Results  Component Value Date   TSH 0.686 03/17/2021   TSH 0.172 (L) 05/16/2020    Therapeutic Level Labs: No results found for: "LITHIUM" No results found for: "VALPROATE" Lab Results  Component Value Date   CBMZ 6.0 02/06/2023    Current Medications: Current Outpatient Medications  Medication Sig  Dispense Refill   acetaminophen (TYLENOL) 325 MG tablet Take 325 mg by mouth every 6 (six) hours as needed.     alendronate (FOSAMAX) 70 MG tablet Take 70 mg by mouth once a week. Take with a full glass of water on an empty stomach.     ALPRAZolam (XANAX) 0.25 MG tablet Take 0.25 mg by mouth as needed for anxiety. Before pap smears     atorvastatin (LIPITOR) 40 MG tablet Take 40 mg by mouth daily.     calcium citrate-vitamin D (CITRACAL+D) 315-200 MG-UNIT per tablet Take 1 tablet by mouth 2 (two) times daily.     carbamazepine (TEGRETOL) 200 MG tablet Take 200 mg by mouth 3 (three) times daily.     Cholecalciferol 100 MCG (4000 UT) TABS Take 1,000 Units by mouth.     esomeprazole (NEXIUM) 40 MG capsule Take 40 mg by mouth daily at 12 noon.     famotidine (PEPCID) 20 MG tablet Take 20 mg by mouth 2 (two) times daily.     hydroxypropyl methylcellulose / hypromellose (ISOPTO TEARS / GONIOVISC) 2.5 % ophthalmic solution 1 drop as needed for dry eyes.     levothyroxine (SYNTHROID) 100 MCG tablet Take 100 mcg by mouth every morning.     magnesium oxide (MAG-OX) 400 MG tablet Take 1 tablet by mouth 3 (three) times daily.     naproxen (NAPROSYN) 375 MG tablet Take 375 mg by mouth 2 (two) times daily with a meal.     OLANZapine (ZYPREXA) 5 MG tablet Take 1 tablet (5 mg total) by mouth daily. 90 tablet 0   potassium chloride 20 MEQ/15ML (10%) SOLN Take 10 mEq by mouth daily.     vitamin E 400 UNIT capsule Take 400 Units by mouth daily.     iron polysaccharides (NIFEREX) 150 MG capsule Take 1 capsule (150 mg total) by mouth 2 (two) times daily for 30 days, THEN 1 capsule (150 mg total) daily. 150 capsule 0   [START ON 06/10/2023] OLANZapine (ZYPREXA) 10 MG tablet Take 1 tablet (10 mg total) by mouth at bedtime. 90 tablet 0   [START ON 06/13/2023] PARoxetine (PAXIL) 20 MG tablet Take 1 tablet (20 mg total) by mouth daily. 90 tablet 1   No current facility-administered medications for this visit.  Musculoskeletal: Strength & Muscle Tone: within normal limits Gait & Station:  wheel chair Patient leans: N/A  Psychiatric Specialty Exam: Review of Systems  Psychiatric/Behavioral:  Positive for hallucinations. Negative for agitation, behavioral problems, confusion, decreased concentration, dysphoric mood, self-injury, sleep disturbance and suicidal ideas. The patient is nervous/anxious. The patient is not hyperactive.   All other systems reviewed and are negative.   Blood pressure 114/84, pulse 87, temperature (!) 97.1 F (36.2 C), temperature source Temporal, SpO2 91%.There is no height or weight on file to calculate BMI.  General Appearance: Fairly Groomed  Eye Contact:  Good  Speech:  Clear and Coherent  Volume:  Normal  Mood:   good  Affect:  Appropriate, Congruent, and calm , less blunt  Thought Process:  Coherent  Orientation:  Full (Time, Place, and Person)  Thought Content: Illogical   Suicidal Thoughts:  No  Homicidal Thoughts:  No  Memory:  Immediate;   Good  Judgement:  Good  Insight:  Good  Psychomotor Activity:  Normal, left arm is internally rotated. Right arm with no rigidity, normal tone, no TD  Concentration:  Concentration: Good and Attention Span: Good  Recall:  Good  Fund of Knowledge: Good  Language: Good  Akathisia:  No  Handed:  Right  AIMS (if indicated): 0 (unable to evaluate gait)  Assets:  Social Support  ADL's:  Impaired  Cognition: Impaired,  Mild  Sleep:  Good   Screenings: GAD-7    Flowsheet Row Office Visit from 12/23/2021 in [facility]  Total GAD-7 Score 1      PHQ2-9    Flowsheet Row Office Visit from 02/03/2022 in [facility] Office Visit from 12/23/2021 in [facility] Office Visit from 11/04/2021 in [facility]  PHQ-2 Total Score 0 0 0      Flowsheet Row ED  from 03/01/2023 in [facility] Department at [facility] ED to Hosp-Admission (Discharged) from 02/06/2023 in [facility] from 11/22/2022 in [facility] Department at [facility]  C-SSRS RISK CATEGORY No Risk No Risk No Risk        Assessment and Plan:  ELLICE BOULTINGHOUSE is a 68 y.o. year old female with a history of depression, anxiety, seizure disorder, cerebral palsy, GERD, osteoporosis , who presents for follow up appointment for below.   1. Schizophrenia, unspecified type (HCC) 2. MDD (major depressive disorder), recurrent, in partial remission (HCC) Acute stressors include: n/a Chronic stressors include: unemployment, limited care (elderly father, RN twice a week)   Transferred from [facility]/Dr. Johny Drilling. Admitted a few times, first at age 30 for depression.  According to the chart review, her father reports history of schizophrenia. No known developmental or intellectual disability. Collateral is limited due to her elderly father, who has memory loss. (Originally on olanzapine 2.5 mg in AM, 10 mg qhs, Paxil 20 mg daily)   The exam is notable for a less blunt affect, and she is more engaged during the visit. However, she continues to demonstrate perseveration, particularly regarding her foot and the smell of "skunk." Although she has occasional VH according to her father, it has been back to the baseline, and there is no significant concern about psychotic symptoms or depression.  Will continue current dose to target schizophrenia.  Noted that although metformin was advised for weight gain associated with antipsychotic use, her father is not interested in this.  Will continue current dose of Paxil to target depression.     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 Moderate periventricular white matter changes are present likely reflecting the sequela of small vessel ischemia, stable since prior examination. 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 No abnormal mass effect or midline shift. No evidence of acute intracranial hemorrhage or infarct. Ventricular size is normal. Cerebellum unremarkable. Neuropsych assessment:  Etiology: r/o vascular   She had a recent episode of AMS, likely delirium in the context of UTI.  It has been resolving in the last few weeks. IADL is limited primarily due to her condition of cerebral palsy.  Will continue to assess as needed.     Last checked  EKG HR 92, QTc415 msec 01/2023  Lipid panels wnl 03/2022  HbA1c 5.1 03/2023  Prolactin < 0.1 -  01/2023   Plan Continue olanzapine 5 mg in the a.m., 10 mg at night  Continue Paxil 20 mg daily  Next appointment: 5/6 at 10 AM, IP - on carbamazepine 200 mg TID for seizure      The patient demonstrates the following risk factors for suicide: Chronic risk factors for suicide include: psychiatric disorder of depression . Acute risk factors for suicide include: unemployment. Protective factors for this patient include: positive social support and hope for the future. Considering these factors, the overall suicide risk at this point appears to be low. Patient is appropriate for outpatient follow up.   Collaboration of Care: Collaboration of Care: Other reviewed notes in Epic  Patient/Guardian was advised Release of Information must be obtained prior to  any record release in order to collaborate their care with an outside provider. Patient/Guardian was advised if they have not already done so to contact the registration department to sign all necessary forms in order for [place] to release information regarding their care.   Consent: Patient/Guardian gives verbal consent for treatment and assignment of benefits for services provided during this visit. Patient/Guardian expressed understanding and agreed to proceed.    Neysa Hotter, MD 05/12/2023, 10:01 AM

## 2023-05-12 ENCOUNTER — Ambulatory Visit (INDEPENDENT_AMBULATORY_CARE_PROVIDER_SITE_OTHER): Payer: Medicare Other | Admitting: Psychiatry

## 2023-05-12 ENCOUNTER — Encounter: Payer: Self-pay | Admitting: Psychiatry

## 2023-05-12 VITALS — BP 114/84 | HR 87 | Temp 97.1°F

## 2023-05-12 DIAGNOSIS — F209 Schizophrenia, unspecified: Secondary | ICD-10-CM | POA: Diagnosis not present

## 2023-05-12 DIAGNOSIS — F3341 Major depressive disorder, recurrent, in partial remission: Secondary | ICD-10-CM | POA: Diagnosis not present

## 2023-05-12 DIAGNOSIS — G40909 Epilepsy, unspecified, not intractable, without status epilepticus: Secondary | ICD-10-CM

## 2023-05-12 MED ORDER — OLANZAPINE 10 MG PO TABS: 10.0000 mg | ORAL_TABLET | Freq: Every day | ORAL | 0 refills | Status: AC

## 2023-05-12 MED ORDER — PAROXETINE HCL 20 MG PO TABS: 20.0000 mg | ORAL_TABLET | Freq: Every day | ORAL | 1 refills | Status: DC

## 2023-05-12 MED ORDER — OLANZAPINE 5 MG PO TABS: 5.0000 mg | ORAL_TABLET | Freq: Every day | ORAL | 0 refills | Status: AC

## 2023-05-12 NOTE — Patient Instructions (Signed)
Continue olanzapine 5 mg in the a.m., 10 mg at night  Continue Paxil 20 mg daily  Next appointment: 5/6 at 10 AM

## 2023-05-15 ENCOUNTER — Encounter: Payer: Self-pay | Admitting: Oncology

## 2023-05-16 ENCOUNTER — Encounter: Payer: Self-pay | Admitting: Oncology

## 2023-05-19 ENCOUNTER — Other Ambulatory Visit: Payer: Self-pay | Admitting: Psychiatry

## 2023-05-26 ENCOUNTER — Inpatient Hospital Stay: Payer: Medicare Other | Attending: Oncology

## 2023-05-26 DIAGNOSIS — D500 Iron deficiency anemia secondary to blood loss (chronic): Secondary | ICD-10-CM | POA: Diagnosis present

## 2023-05-26 LAB — CBC WITH DIFFERENTIAL (CANCER CENTER ONLY)
Abs Immature Granulocytes: 0.01 10*3/uL (ref 0.00–0.07)
Basophils Absolute: 0.1 10*3/uL (ref 0.0–0.1)
Basophils Relative: 1 %
Eosinophils Absolute: 0.3 10*3/uL (ref 0.0–0.5)
Eosinophils Relative: 5 %
HCT: 34.7 % — ABNORMAL LOW (ref 36.0–46.0)
Hemoglobin: 10.9 g/dL — ABNORMAL LOW (ref 12.0–15.0)
Immature Granulocytes: 0 %
Lymphocytes Relative: 17 %
Lymphs Abs: 0.8 10*3/uL (ref 0.7–4.0)
MCH: 31.3 pg (ref 26.0–34.0)
MCHC: 31.4 g/dL (ref 30.0–36.0)
MCV: 99.7 fL (ref 80.0–100.0)
Monocytes Absolute: 0.8 10*3/uL (ref 0.1–1.0)
Monocytes Relative: 16 %
Neutro Abs: 3.1 10*3/uL (ref 1.7–7.7)
Neutrophils Relative %: 61 %
Platelet Count: 319 10*3/uL (ref 150–400)
RBC: 3.48 MIL/uL — ABNORMAL LOW (ref 3.87–5.11)
RDW: 13.5 % (ref 11.5–15.5)
WBC Count: 5 10*3/uL (ref 4.0–10.5)
nRBC: 0 % (ref 0.0–0.2)

## 2023-05-26 LAB — IRON AND TIBC
Iron: 40 ug/dL (ref 28–170)
Saturation Ratios: 12 % (ref 10.4–31.8)
TIBC: 332 ug/dL (ref 250–450)
UIBC: 292 ug/dL

## 2023-05-26 LAB — FERRITIN: Ferritin: 14 ng/mL (ref 11–307)

## 2023-05-27 ENCOUNTER — Inpatient Hospital Stay (HOSPITAL_BASED_OUTPATIENT_CLINIC_OR_DEPARTMENT_OTHER): Payer: Medicare Other | Admitting: Oncology

## 2023-05-27 ENCOUNTER — Encounter: Payer: Self-pay | Admitting: Oncology

## 2023-05-27 ENCOUNTER — Inpatient Hospital Stay: Payer: Medicare Other

## 2023-05-27 VITALS — BP 89/53 | HR 89 | Temp 98.6°F | Resp 19

## 2023-05-27 VITALS — BP 92/51 | HR 92 | Temp 97.9°F | Resp 18

## 2023-05-27 DIAGNOSIS — D500 Iron deficiency anemia secondary to blood loss (chronic): Secondary | ICD-10-CM

## 2023-05-27 MED ORDER — SODIUM CHLORIDE 0.9% FLUSH
10.0000 mL | Freq: Once | INTRAVENOUS | Status: AC | PRN
  Administered 2023-05-27: 10 mL
  Filled 2023-05-27: qty 10

## 2023-05-27 MED ORDER — IRON SUCROSE 20 MG/ML IV SOLN
200.0000 mg | Freq: Once | INTRAVENOUS | Status: AC
  Administered 2023-05-27: 200 mg via INTRAVENOUS

## 2023-05-27 NOTE — Progress Notes (Signed)
 BP post venofer 89/53. Cathie Hoops, MD notified and pt stated she was asymptomatic. Per Cathie Hoops, MD ok to discharge and pt has chronically low bps. Pt was stable at discharge.

## 2023-05-27 NOTE — Assessment & Plan Note (Addendum)
 Lab Results  Component Value Date   HGB 10.9 (L) 05/26/2023   TIBC 332 05/26/2023   IRONPCTSAT 12 05/26/2023   FERRITIN 14 05/26/2023    IV Venofer weekly x 2 Recommend patient to follow up with GI for evaluation of blood loss

## 2023-05-27 NOTE — Progress Notes (Signed)
 Hematology/Oncology Progress note [phone] [phone]        CHIEF COMPLAINTS/REASON FOR VISIT:  IDA  ASSESSMENT & PLAN:   Iron deficiency anemia due to chronic blood loss Lab Results  Component Value Date   HGB 10.9 (L) 05/26/2023   TIBC 332 05/26/2023   IRONPCTSAT 12 05/26/2023   FERRITIN 14 05/26/2023    IV Venofer weekly x 2 Recommend patient to follow up with GI for evaluation of blood loss  Orders Placed This Encounter  Procedures   CBC with Differential (Cancer Center Only)    Standing Status:   Future    Expected Date:   09/24/2023    Expiration Date:   05/26/2024   Ferritin    Standing Status:   Future    Expected Date:   09/24/2023    Expiration Date:   05/26/2024   Iron and TIBC    Standing Status:   Future    Expected Date:   09/24/2023    Expiration Date:   05/26/2024   Retic Panel    Standing Status:   Future    Expected Date:   09/24/2023    Expiration Date:   05/26/2024   Follow-up 6 months.  All questions were answered. The patient knows to call the clinic with any problems, questions or concerns.  Rickard Patience, MD, PhD [facility] Hematology Oncology 05/27/2023   HISTORY OF PRESENTING ILLNESS:   Patient has a history of seizure disorder/history of cerebral palsy Patient is a poor historian.  She was accompanied by her mother who has hearing deficiency and not able to provide much history.Marland Kitchen   History was mainly obtained from reviewing medical records.  06/14/2020, CT chest with contrast showed a lobulated nodule with mildly spiculated margin in the posterior right chest persist and is suspicious for bronchogenic neoplasm based on morphologic repeat years Nodule along the minor fissure in the right chest measures 6 mm, nonspecific.  Moderately large hiatal hernia 11 mm distal common bile duct, previously 8 to 9 mm.  No pancreatic duct dilatation is visualized.  Prominence of the common bile duct on the prior study in this patient's  post cholecystectomy.  Aortic atherosclerosis.  Patient was referred to establish care with oncology for further evaluation of the lung nodule.  She denies any cough, shortness of breath, hemoptysis, patient is a former smoker, and quit in 1982.  She reports only smoked for 4 months and mostly on weekends.  Patient has contractures and physical deformities which severely limits her  mobility.  She lives at home with parents.   Lung nodule  07/02/2020 no activity on PET scan. 01/04/2021, CT chest without contrast showed stable size of the lung nodule.  Likely benign. 01/07/22 CT chest without contrast showed showed stable/slightly smaller 1.4 cm posterior right lower lobe pulmonary nodule.  Likely benign.  New right upper lobe groundglass nodule with patchy groundglass opacity.   07/17/2022 CT chest wo contrast showed  1. No suspicious pulmonary nodules. Previously demonstrated nodules are stable to decreased in size, consistent with benign findings. 2. Patchy ground-glass opacities in both lungs have mildly progressed posteriorly in the right upper lobe, but are non focal  and likely inflammatory/postinflammatory. 3. Moderate to large hiatal hernia, mildly increased in size from previous imaging. 4. No adenopathy or pleural effusion. 5. Aortic Atherosclerosis (ICD10-I70.0) and Emphysema   04/13/22 EGD showed gastritis, 04/21/22 colonoscopy negative. Small bowel enteroscopy negative.   INTERVAL HISTORY Michele James is a 68 y.o. female who  has above history reviewed by me today presents for acute visit for anemia   Today she was accompanied by her partner. She is a poor historian.  She reports taking oral iron supplementation. Denies blood in stool, abdominal pain, weight loss She has colonoscopy schedule next week.     Review of Systems  Unable to perform ROS: Other  Constitutional:  Positive for appetite change. Negative for fatigue and unexpected weight change.  Respiratory:  Negative for  cough and shortness of breath.   Cardiovascular:  Negative for chest pain.  Gastrointestinal:  Negative for abdominal pain.  Musculoskeletal:  Negative for back pain.  Hematological:  Does not bruise/bleed easily.  Psychiatric/Behavioral:  Negative for confusion.     MEDICAL HISTORY:  Past Medical History:  Diagnosis Date   Anemia    Arthritis    knees   Cerebral palsy (HCC)    Depression    Diabetes mellitus without complication (HCC)    Diverticulosis    Dysphagia    Eczema    GERD (gastroesophageal reflux disease)    Glaucoma    Hemorrhoids    Hyperlipidemia    Hypothyroidism    Seizures (HCC)    none in over 10 yrs    SURGICAL HISTORY: Past Surgical History:  Procedure Laterality Date   BREAST BIOPSY Right 03/22/2020   stereo bx, ribbon clip,  FAT NECROSIS AND FIBROSIS   CATARACT EXTRACTION W/PHACO Right 02/06/2016   Procedure: CATARACT EXTRACTION PHACO AND INTRAOCULAR LENS PLACEMENT (IOC);  Surgeon: Lockie Mola, MD;  Location: [facility];  Service: Ophthalmology;  Laterality: Right;   CATARACT EXTRACTION W/PHACO Left 03/05/2016   Procedure: CATARACT EXTRACTION PHACO AND INTRAOCULAR LENS PLACEMENT (IOC);  Surgeon: Lockie Mola, MD;  Location: [facility];  Service: Ophthalmology;  Laterality: Left;   CHOLECYSTECTOMY N/A 11/16/2015   Procedure: LAPAROSCOPIC CHOLECYSTECTOMY;  Surgeon: Tiney Rouge III, MD;  Location: [facility] ORS;  Service: General;  Laterality: N/A;  attempted cholangiogram   COLONOSCOPY WITH PROPOFOL N/A 12/25/2014   Procedure: COLONOSCOPY WITH PROPOFOL;  Surgeon: Wallace Cullens, MD;  Location: [facility] ENDOSCOPY;  Service: Gastroenterology;  Laterality: N/A;   COLONOSCOPY WITH PROPOFOL N/A 04/14/2019   Procedure: COLONOSCOPY WITH PROPOFOL;  Surgeon: Toledo, Boykin Nearing, MD;  Location: [facility] ENDOSCOPY;  Service: Gastroenterology;  Laterality: N/A;   COLONOSCOPY WITH PROPOFOL N/A 04/20/2022   Procedure: COLONOSCOPY WITH PROPOFOL;  Surgeon:  Wyline Mood, MD;  Location: [facility] ENDOSCOPY;  Service: Gastroenterology;  Laterality: N/A;   ENDOSCOPIC RETROGRADE CHOLANGIOPANCREATOGRAPHY (ERCP) WITH PROPOFOL N/A 11/18/2015   Procedure: ENDOSCOPIC RETROGRADE CHOLANGIOPANCREATOGRAPHY (ERCP) WITH PROPOFOL;  Surgeon: Midge Minium, MD;  Location: [facility] ENDOSCOPY;  Service: Endoscopy;  Laterality: N/A;   ESOPHAGOGASTRODUODENOSCOPY (EGD) WITH PROPOFOL N/A 04/14/2019   Procedure: ESOPHAGOGASTRODUODENOSCOPY (EGD) WITH PROPOFOL;  Surgeon: Toledo, Boykin Nearing, MD;  Location: [facility] ENDOSCOPY;  Service: Gastroenterology;  Laterality: N/A;   ESOPHAGOGASTRODUODENOSCOPY (EGD) WITH PROPOFOL N/A 04/20/2022   Procedure: ESOPHAGOGASTRODUODENOSCOPY (EGD) WITH PROPOFOL;  Surgeon: Wyline Mood, MD;  Location: [facility] ENDOSCOPY;  Service: Gastroenterology;  Laterality: N/A;   EYE SURGERY     Cataract Surgery    HERNIA REPAIR      SOCIAL HISTORY: Social History   Socioeconomic History   Marital status: Single    Spouse name: Not on file   Number of children: Not on file   Years of education: Not on file   Highest education level: Not on file  Occupational History   Not on file  Tobacco Use   Smoking status: Former  Current packs/day: 0.00    Types: Cigarettes    Quit date: 06/20/1980    Years since quitting: 42.9   Smokeless tobacco: Never   Tobacco comments:    smoked socially in early 1980s (reports for 4 months)  Vaping Use   Vaping status: Never Used  Substance and Sexual Activity   Alcohol use: No   Drug use: No   Sexual activity: Not Currently  Other Topics Concern   Not on file  Social History Narrative   Not on file   Social Drivers of Health   Financial Resource Strain: Low Risk  (10/01/2022)   Received from [facility], [facility]   Overall Financial Resource Strain (CARDIA)    Difficulty of Paying Living Expenses: Not hard at all  Food Insecurity: Patient Unable To Answer (02/06/2023)   Hunger Vital  Sign    Worried About Running Out of Food in the Last Year: Patient unable to answer    Ran Out of Food in the Last Year: Patient unable to answer  Transportation Needs: Patient Unable To Answer (02/06/2023)   PRAPARE - Transportation    Lack of Transportation (Medical): Patient unable to answer    Lack of Transportation (Non-Medical): Patient unable to answer  Physical Activity: Not on file  Stress: Not on file  Social Connections: Not on file  Intimate Partner Violence: Patient Unable To Answer (02/06/2023)   Humiliation, Afraid, Rape, and Kick questionnaire    Fear of Current or Ex-Partner: Patient unable to answer    Emotionally Abused: Patient unable to answer    Physically Abused: Patient unable to answer    Sexually Abused: Patient unable to answer    FAMILY HISTORY: Family History  Problem Relation Age of Onset   High blood pressure Mother    Hyperlipidemia Mother    Diabetes Mellitus II Mother    Stroke Mother    Osteoporosis Mother    High blood pressure Father    Breast cancer Maternal Aunt    Stroke Maternal Grandfather    Diabetes Mellitus II Maternal Grandfather    Stroke Maternal Grandmother    Diabetes Mellitus II Maternal Grandmother     ALLERGIES:  is allergic to aminophylline, cinnamon, amoxicillin, latex, nsaids, penicillins, potassium, potassium-containing compounds, septra [sulfamethoxazole-trimethoprim], sulfa antibiotics, sulfasalazine, tape, and theophyllines.  MEDICATIONS:  Current Outpatient Medications  Medication Sig Dispense Refill   acetaminophen (TYLENOL) 325 MG tablet Take 325 mg by mouth every 6 (six) hours as needed.     alendronate (FOSAMAX) 70 MG tablet Take 70 mg by mouth once a week. Take with a full glass of water on an empty stomach.     ALPRAZolam (XANAX) 0.25 MG tablet Take 0.25 mg by mouth as needed for anxiety. Before pap smears     atorvastatin (LIPITOR) 40 MG tablet Take 40 mg by mouth daily.     calcium citrate-vitamin D  (CITRACAL+D) 315-200 MG-UNIT per tablet Take 1 tablet by mouth 2 (two) times daily.     carbamazepine (TEGRETOL) 200 MG tablet Take 200 mg by mouth 3 (three) times daily.     Cholecalciferol 100 MCG (4000 UT) TABS Take 1,000 Units by mouth.     esomeprazole (NEXIUM) 40 MG capsule Take 40 mg by mouth daily at 12 noon.     famotidine (PEPCID) 20 MG tablet Take 20 mg by mouth 2 (two) times daily.     hydroxypropyl methylcellulose / hypromellose (ISOPTO TEARS / GONIOVISC) 2.5 % ophthalmic solution 1  drop as needed for dry eyes.     iron polysaccharides (NIFEREX) 150 MG capsule Take 1 capsule (150 mg total) by mouth 2 (two) times daily for 30 days, THEN 1 capsule (150 mg total) daily. 150 capsule 0   levothyroxine (SYNTHROID) 100 MCG tablet Take 100 mcg by mouth every morning.     magnesium oxide (MAG-OX) 400 MG tablet Take 1 tablet by mouth 3 (three) times daily.     naproxen (NAPROSYN) 375 MG tablet Take 375 mg by mouth 2 (two) times daily with a meal.     [START ON 06/10/2023] OLANZapine (ZYPREXA) 10 MG tablet Take 1 tablet (10 mg total) by mouth at bedtime. 90 tablet 0   OLANZapine (ZYPREXA) 5 MG tablet Take 1 tablet (5 mg total) by mouth daily. 90 tablet 0   [START ON 06/13/2023] PARoxetine (PAXIL) 20 MG tablet Take 1 tablet (20 mg total) by mouth daily. 90 tablet 1   potassium chloride 20 MEQ/15ML (10%) SOLN Take 10 mEq by mouth daily.     vitamin E 400 UNIT capsule Take 400 Units by mouth daily.     No current facility-administered medications for this visit.     PHYSICAL EXAMINATION: ECOG PERFORMANCE STATUS: 2 - Symptomatic, <50% confined to bed Vitals:   05/27/23 1333  BP: (!) 92/51  Pulse: 92  Resp: 18  Temp: 97.9 F (36.6 C)  SpO2: 90%   There were no vitals filed for this visit.    Physical Exam Constitutional:      General: She is not in acute distress.    Comments: Patient sits in the wheelchair  HENT:     Head: Normocephalic and atraumatic.  Eyes:     General: No  scleral icterus. Cardiovascular:     Rate and Rhythm: Normal rate and regular rhythm.     Heart sounds: Normal heart sounds.  Pulmonary:     Effort: Pulmonary effort is normal. No respiratory distress.     Breath sounds: No wheezing.  Abdominal:     General: Bowel sounds are normal. There is no distension.     Palpations: Abdomen is soft.  Musculoskeletal:        General: Deformity present.     Cervical back: Normal range of motion and neck supple.     Comments: Contracture of left upper extremity in the left lower extremity.  Skin:    General: Skin is warm and dry.     Coloration: Skin is pale.     Findings: No erythema or rash.  Neurological:     Mental Status: She is alert. Mental status is at baseline.     LABORATORY DATA:  I have reviewed the data as listed    Latest Ref Rng & Units 05/26/2023   11:22 AM 03/01/2023    2:26 PM 02/11/2023    4:44 AM  CBC  WBC 4.0 - 10.5 K/uL 5.0  4.9  5.7   Hemoglobin 12.0 - 15.0 g/dL 16.1  09.6  04.5   Hematocrit 36.0 - 46.0 % 34.7  34.6  35.5   Platelets 150 - 400 K/uL 319  350  380       Latest Ref Rng & Units 03/01/2023    2:26 PM 02/11/2023    4:44 AM 02/10/2023    4:10 AM  CMP  Glucose 70 - 99 mg/dL 409  811  914   BUN 8 - 23 mg/dL 14  7  9    Creatinine 0.44 - 1.00 mg/dL 7.82  0.44  0.41   Sodium 135 - 145 mmol/L 138  134  134   Potassium 3.5 - 5.1 mmol/L 3.5  4.1  3.9   Chloride 98 - 111 mmol/L 102  102  102   CO2 22 - 32 mmol/L 28  27  27    Calcium 8.9 - 10.3 mg/dL 9.0  8.6  8.3   Total Protein 6.5 - 8.1 g/dL 6.0     Total Bilirubin <1.2 mg/dL 0.2     Alkaline Phos 38 - 126 U/L 66     AST 15 - 41 U/L 16     ALT 0 - 44 U/L 12         RADIOGRAPHIC STUDIES: I have personally reviewed the radiological images as listed and agreed with the findings in the report. No results found.

## 2023-06-04 ENCOUNTER — Other Ambulatory Visit: Payer: Self-pay | Admitting: Psychiatry

## 2023-06-04 ENCOUNTER — Inpatient Hospital Stay: Payer: Medicare Other | Attending: Oncology

## 2023-06-04 VITALS — BP 99/52 | HR 82 | Temp 98.7°F | Resp 18

## 2023-06-04 DIAGNOSIS — D500 Iron deficiency anemia secondary to blood loss (chronic): Secondary | ICD-10-CM | POA: Insufficient documentation

## 2023-06-04 MED ORDER — IRON SUCROSE 20 MG/ML IV SOLN
200.0000 mg | Freq: Once | INTRAVENOUS | Status: AC
  Administered 2023-06-04: 200 mg via INTRAVENOUS

## 2023-06-04 NOTE — Patient Instructions (Signed)

## 2023-06-26 ENCOUNTER — Other Ambulatory Visit (INDEPENDENT_AMBULATORY_CARE_PROVIDER_SITE_OTHER): Payer: Self-pay | Admitting: Vascular Surgery

## 2023-06-26 DIAGNOSIS — L97413 Non-pressure chronic ulcer of right heel and midfoot with necrosis of muscle: Secondary | ICD-10-CM

## 2023-06-28 DIAGNOSIS — E785 Hyperlipidemia, unspecified: Secondary | ICD-10-CM | POA: Insufficient documentation

## 2023-06-28 DIAGNOSIS — I7025 Atherosclerosis of native arteries of other extremities with ulceration: Secondary | ICD-10-CM | POA: Insufficient documentation

## 2023-06-28 DIAGNOSIS — J449 Chronic obstructive pulmonary disease, unspecified: Secondary | ICD-10-CM | POA: Insufficient documentation

## 2023-06-28 NOTE — Progress Notes (Unsigned)
 MRN : [id number]  Michele James is a 68 y.o. (October 27, 1955) female who presents with chief complaint of check circulation.  History of Present Illness:    The patient is seen for evaluation of painful lower extremities and diminished pulses. Patient notes the pain is always associated with activity and is very consistent day today. Typically, the pain occurs at less than one block, progress is as activity continues to the point that the patient must stop walking. Resting including standing still for several minutes allows the patient to walk a similar distance before being forced to stop again. Uneven terrain and inclines shorten the distance. The pain has been progressive over the past several years. The patient denies any abrupt changes in claudication symptoms.  The patient states the inability to walk is causing problems with daily activities.  The patient denies rest pain or dangling of an extremity off the side of the bed during the night for relief. No open wounds or sores at this time. No prior interventions or surgeries.  No history of back problems or DJD of the lumbar sacral spine.   The patient's blood pressure has been stable and relatively well controlled. The patient denies amaurosis fugax or recent TIA symptoms. There are no recent neurological changes noted. The patient denies history of DVT, PE or superficial thrombophlebitis. The patient denies recent episodes of angina or shortness of breath.   No outpatient medications have been marked as taking for the 06/29/23 encounter (Appointment) with Gilda Crease, Latina Craver, MD.    Past Medical History:  Diagnosis Date   Anemia    Arthritis    knees   Cerebral palsy (HCC)    Depression    Diabetes mellitus without complication (HCC)    Diverticulosis    Dysphagia    Eczema    GERD (gastroesophageal reflux disease)    Glaucoma    Hemorrhoids     Hyperlipidemia    Hypothyroidism    Seizures (HCC)    none in over 10 yrs    Past Surgical History:  Procedure Laterality Date   BREAST BIOPSY Right 03/22/2020   stereo bx, ribbon clip,  FAT NECROSIS AND FIBROSIS   CATARACT EXTRACTION W/PHACO Right 02/06/2016   Procedure: CATARACT EXTRACTION PHACO AND INTRAOCULAR LENS PLACEMENT (IOC);  Surgeon: Lockie Mola, MD;  Location: [facility];  Service: Ophthalmology;  Laterality: Right;   CATARACT EXTRACTION W/PHACO Left 03/05/2016   Procedure: CATARACT EXTRACTION PHACO AND INTRAOCULAR LENS PLACEMENT (IOC);  Surgeon: Lockie Mola, MD;  Location: [facility];  Service: Ophthalmology;  Laterality: Left;   CHOLECYSTECTOMY N/A 11/16/2015   Procedure: LAPAROSCOPIC CHOLECYSTECTOMY;  Surgeon: Tiney Rouge III, MD;  Location: [facility] ORS;  Service: General;  Laterality: N/A;  attempted cholangiogram   COLONOSCOPY WITH PROPOFOL N/A 12/25/2014   Procedure: COLONOSCOPY WITH PROPOFOL;  Surgeon: Wallace Cullens, MD;  Location: [facility] ENDOSCOPY;  Service: Gastroenterology;  Laterality: N/A;   COLONOSCOPY WITH PROPOFOL N/A 04/14/2019   Procedure: COLONOSCOPY WITH PROPOFOL;  Surgeon: Toledo, Boykin Nearing, MD;  Location: [facility] ENDOSCOPY;  Service: Gastroenterology;  Laterality: N/A;   COLONOSCOPY WITH  PROPOFOL N/A 04/20/2022   Procedure: COLONOSCOPY WITH PROPOFOL;  Surgeon: Wyline Mood, MD;  Location: [facility] ENDOSCOPY;  Service: Gastroenterology;  Laterality: N/A;   ENDOSCOPIC RETROGRADE CHOLANGIOPANCREATOGRAPHY (ERCP) WITH PROPOFOL N/A 11/18/2015   Procedure: ENDOSCOPIC RETROGRADE CHOLANGIOPANCREATOGRAPHY (ERCP) WITH PROPOFOL;  Surgeon: Midge Minium, MD;  Location: [facility] ENDOSCOPY;  Service: Endoscopy;  Laterality: N/A;   ESOPHAGOGASTRODUODENOSCOPY (EGD) WITH PROPOFOL N/A 04/14/2019   Procedure: ESOPHAGOGASTRODUODENOSCOPY (EGD) WITH PROPOFOL;  Surgeon: Toledo, Boykin Nearing, MD;  Location: [facility] ENDOSCOPY;  Service: Gastroenterology;  Laterality: N/A;    ESOPHAGOGASTRODUODENOSCOPY (EGD) WITH PROPOFOL N/A 04/20/2022   Procedure: ESOPHAGOGASTRODUODENOSCOPY (EGD) WITH PROPOFOL;  Surgeon: Wyline Mood, MD;  Location: [facility] ENDOSCOPY;  Service: Gastroenterology;  Laterality: N/A;   EYE SURGERY     Cataract Surgery    HERNIA REPAIR      Social History Social History   Tobacco Use   Smoking status: Former    Current packs/day: 0.00    Types: Cigarettes    Quit date: 06/20/1980    Years since quitting: 43.0   Smokeless tobacco: Never   Tobacco comments:    smoked socially in early 1980s (reports for 4 months)  Vaping Use   Vaping status: Never Used  Substance Use Topics   Alcohol use: No   Drug use: No    Family History Family History  Problem Relation Age of Onset   High blood pressure Mother    Hyperlipidemia Mother    Diabetes Mellitus II Mother    Stroke Mother    Osteoporosis Mother    High blood pressure Father    Breast cancer Maternal Aunt    Stroke Maternal Grandfather    Diabetes Mellitus II Maternal Grandfather    Stroke Maternal Grandmother    Diabetes Mellitus II Maternal Grandmother     Allergies  Allergen Reactions   Aminophylline Other (See Comments)    Headache   Cinnamon Other (See Comments)    Stomachache    Amoxicillin Rash   Latex Rash   Nsaids Rash   Penicillins Nausea And Vomiting   Potassium Nausea And Vomiting and Nausea Only   Potassium-Containing Compounds Nausea And Vomiting   Septra [Sulfamethoxazole-Trimethoprim] Rash   Sulfa Antibiotics Rash   Sulfasalazine Rash   Tape Rash    Paper tape ok   Theophyllines Other (See Comments)    Headache      REVIEW OF SYSTEMS (Negative unless checked)  Constitutional: [] Weight loss  [] Fever  [] Chills Cardiac: [] Chest pain   [] Chest pressure   [] Palpitations   [] Shortness of breath when laying flat   [] Shortness of breath with exertion. Vascular:  [x] Pain in legs with walking   [] Pain in legs at rest  [] History of DVT   [] Phlebitis   [] Swelling  in legs   [] Varicose veins   [] Non-healing ulcers Pulmonary:   [] Uses home oxygen   [] Productive cough   [] Hemoptysis   [] Wheeze  [] COPD   [] Asthma Neurologic:  [] Dizziness   [] Seizures   [] History of stroke   [] History of TIA  [] Aphasia   [] Vissual changes   [] Weakness or numbness in arm   [] Weakness or numbness in leg Musculoskeletal:   [] Joint swelling   [] Joint pain   [] Low back pain Hematologic:  [] Easy bruising  [] Easy bleeding   [] Hypercoagulable state   [] Anemic Gastrointestinal:  [] Diarrhea   [] Vomiting  [] Gastroesophageal reflux/heartburn   [] Difficulty swallowing. Genitourinary:  [] Chronic kidney disease   [] Difficult urination  [] Frequent urination   [] Blood in urine Skin:  [] Rashes   []   Ulcers  Psychological:  [] History of anxiety   []  History of major depression.  Physical Examination  There were no vitals filed for this visit. There is no height or weight on file to calculate BMI. Gen: WD/WN, NAD Head: [place]/AT, No temporalis wasting.  Ear/Nose/Throat: Hearing grossly intact, nares w/o erythema or drainage Eyes: PER, EOMI, sclera nonicteric.  Neck: Supple, no masses.  No bruit or JVD.  Pulmonary:  Good air movement, no audible wheezing, no use of accessory muscles.  Cardiac: RRR, normal S1, S2, no Murmurs. Vascular:  mild trophic changes, no open wounds Vessel Right Left  Radial Palpable Palpable  PT Not Palpable Not Palpable  DP Not Palpable Not Palpable  Gastrointestinal: soft, non-distended. No guarding/no peritoneal signs.  Musculoskeletal: M/S 5/5 throughout.  No visible deformity.  Neurologic: CN 2-12 intact. Pain and light touch intact in extremities.  Symmetrical.  Speech is fluent. Motor exam as listed above. Psychiatric: Judgment intact, Mood & affect appropriate for pt's clinical situation. Dermatologic: No rashes or ulcers noted.  No changes consistent with cellulitis.   CBC Lab Results  Component Value Date   WBC 5.0 05/26/2023   HGB 10.9 (L) 05/26/2023    HCT 34.7 (L) 05/26/2023   MCV 99.7 05/26/2023   PLT 319 05/26/2023    BMET    Component Value Date/Time   NA 138 03/01/2023 1426   K 3.5 03/01/2023 1426   CL 102 03/01/2023 1426   CO2 28 03/01/2023 1426   GLUCOSE 140 (H) 03/01/2023 1426   BUN 14 03/01/2023 1426   CREATININE 0.71 03/01/2023 1426   CALCIUM 9.0 03/01/2023 1426   GFRNONAA >60 03/01/2023 1426   GFRAA >60 07/15/2019 1719   CrCl cannot be calculated (Patient's most recent lab result is older than the maximum 21 days allowed.).  COAG Lab Results  Component Value Date   INR 1.1 10/18/2022    Radiology No results found.   Assessment/Plan There are no diagnoses linked to this encounter.   Levora Dredge, MD  06/28/2023 1:57 PM

## 2023-06-29 ENCOUNTER — Ambulatory Visit (INDEPENDENT_AMBULATORY_CARE_PROVIDER_SITE_OTHER): Payer: Medicare Other

## 2023-06-29 ENCOUNTER — Ambulatory Visit (INDEPENDENT_AMBULATORY_CARE_PROVIDER_SITE_OTHER): Payer: Medicare Other | Admitting: Vascular Surgery

## 2023-06-29 ENCOUNTER — Encounter (INDEPENDENT_AMBULATORY_CARE_PROVIDER_SITE_OTHER): Payer: Self-pay | Admitting: Vascular Surgery

## 2023-06-29 VITALS — BP 118/74 | HR 91 | Resp 20 | Ht 63.0 in | Wt 158.0 lb

## 2023-06-29 DIAGNOSIS — E119 Type 2 diabetes mellitus without complications: Secondary | ICD-10-CM | POA: Diagnosis not present

## 2023-06-29 DIAGNOSIS — I7025 Atherosclerosis of native arteries of other extremities with ulceration: Secondary | ICD-10-CM | POA: Diagnosis not present

## 2023-06-29 DIAGNOSIS — L97413 Non-pressure chronic ulcer of right heel and midfoot with necrosis of muscle: Secondary | ICD-10-CM | POA: Diagnosis not present

## 2023-06-29 DIAGNOSIS — K219 Gastro-esophageal reflux disease without esophagitis: Secondary | ICD-10-CM | POA: Diagnosis not present

## 2023-06-29 DIAGNOSIS — J449 Chronic obstructive pulmonary disease, unspecified: Secondary | ICD-10-CM

## 2023-06-29 DIAGNOSIS — E782 Mixed hyperlipidemia: Secondary | ICD-10-CM

## 2023-06-29 LAB — VAS US ABI WITH/WO TBI
Left ABI: 1.28
Right ABI: 1.27

## 2023-07-02 ENCOUNTER — Ambulatory Visit: Payer: Medicare Other | Admitting: Psychiatry

## 2023-07-30 ENCOUNTER — Other Ambulatory Visit: Payer: Self-pay | Admitting: Psychiatry

## 2023-07-31 NOTE — Progress Notes (Unsigned)
 No show

## 2023-08-04 ENCOUNTER — Ambulatory Visit (INDEPENDENT_AMBULATORY_CARE_PROVIDER_SITE_OTHER): Payer: Medicare Other | Admitting: Psychiatry

## 2023-08-04 DIAGNOSIS — Z91199 Patient's noncompliance with other medical treatment and regimen due to unspecified reason: Secondary | ICD-10-CM

## 2023-08-05 ENCOUNTER — Encounter
Admission: RE | Disposition: A | Discharge status: Home / Self Care | Payer: Self-pay | Source: Home / Self Care | Attending: Internal Medicine

## 2023-08-05 ENCOUNTER — Ambulatory Visit
Admission: RE | Admit: 2023-08-05 | Discharge: 2023-08-05 | Disposition: A | Discharge status: Home / Self Care | Payer: Medicare Other | Attending: Internal Medicine | Admitting: Internal Medicine

## 2023-08-05 ENCOUNTER — Encounter: Payer: Self-pay | Admitting: Internal Medicine

## 2023-08-05 DIAGNOSIS — R1013 Epigastric pain: Secondary | ICD-10-CM | POA: Insufficient documentation

## 2023-08-05 DIAGNOSIS — Z538 Procedure and treatment not carried out for other reasons: Secondary | ICD-10-CM | POA: Diagnosis not present

## 2023-08-05 DIAGNOSIS — D509 Iron deficiency anemia, unspecified: Secondary | ICD-10-CM | POA: Insufficient documentation

## 2023-08-05 SURGERY — COLONOSCOPY WITH PROPOFOL
Anesthesia: General

## 2023-08-05 MED ORDER — SODIUM CHLORIDE 0.9 % IV SOLN: INTRAVENOUS | Status: DC

## 2023-08-05 NOTE — H&P (Signed)
 Outpatient short stay form Pre-procedure 08/05/2023 8:50 AM Marlisa Caridi K. Corky Diener, M.D.  Primary Physician: Rory Collard, M.D.  Reason for visit:  Iron  deficiency anemia, GERD  History of present illness:  EGD 04/14/19- Dr Corky Diener- Benign esophageal stenosis, gastritis, duodenal diverticulum. There was no H pylori, Barretts, dysplasia/malignancy. Colonoscopy 04/14/19- Dr Corky Diener- diverticulosis VCE 2021- scattered avms in the small intestine, nonbleeding. There was a healing jejunal ulceration. ddx included NSAID, celiac, crohns ( no other evidence of this on colonoscopy ), lymphoma (less likely).   Celiac panel negative. Serum immunoglobulins normal with the exception of mild elevation of IgA only. CBC and iron  panel much improved 09/09/19.     Current Facility-Administered Medications:    0.9 %  sodium chloride  infusion, , Intravenous, Continuous, Shravya Wickwire K, MD  Medications Prior to Admission  Medication Sig Dispense Refill Last Dose/Taking   acetaminophen  (TYLENOL ) 325 MG tablet Take 325 mg by mouth every 6 (six) hours as needed.      alendronate  (FOSAMAX ) 70 MG tablet Take 70 mg by mouth once a week. Take with a full glass of water on an empty stomach.      ALPRAZolam  (XANAX ) 0.25 MG tablet Take 0.25 mg by mouth as needed for anxiety. Before pap smears      atorvastatin  (LIPITOR) 40 MG tablet Take 40 mg by mouth daily.      calcium  citrate-vitamin D  (CITRACAL+D) 315-200 MG-UNIT per tablet Take 1 tablet by mouth 2 (two) times daily.      carbamazepine  (TEGRETOL ) 200 MG tablet Take 200 mg by mouth 3 (three) times daily.      Cholecalciferol  100 MCG (4000 UT) TABS Take 1,000 Units by mouth.      esomeprazole (NEXIUM) 40 MG capsule Take 40 mg by mouth daily at 12 noon.      famotidine  (PEPCID ) 20 MG tablet Take 20 mg by mouth 2 (two) times daily.      hydroxypropyl methylcellulose / hypromellose (ISOPTO TEARS / GONIOVISC) 2.5 % ophthalmic solution 1 drop as needed for dry eyes.       iron  polysaccharides (NIFEREX) 150 MG capsule Take 1 capsule (150 mg total) by mouth 2 (two) times daily for 30 days, THEN 1 capsule (150 mg total) daily. 150 capsule 0    levothyroxine  (SYNTHROID ) 100 MCG tablet Take 100 mcg by mouth every morning.      magnesium  oxide (MAG-OX) 400 MG tablet Take 1 tablet by mouth 3 (three) times daily.      naproxen (NAPROSYN) 375 MG tablet Take 375 mg by mouth 2 (two) times daily with a meal.      OLANZapine  (ZYPREXA ) 10 MG tablet Take 1 tablet (10 mg total) by mouth at bedtime. 90 tablet 0    OLANZapine  (ZYPREXA ) 5 MG tablet Take 1 tablet (5 mg total) by mouth daily. 90 tablet 0    PARoxetine  (PAXIL ) 20 MG tablet TAKE 1 TABLET BY MOUTH EVERY DAY 90 tablet 1    potassium chloride  20 MEQ/15ML (10%) SOLN Take 10 mEq by mouth daily.      vitamin E 400 UNIT capsule Take 400 Units by mouth daily.        Allergies  Allergen Reactions   Aminophylline Other (See Comments)    Headache   Cinnamon Other (See Comments)    Stomachache    Amoxicillin Rash   Latex Rash   Nsaids Rash   Penicillins Nausea And Vomiting   Potassium Nausea And Vomiting and Nausea Only   Potassium-Containing Compounds Nausea  And Vomiting   Septra [Sulfamethoxazole-Trimethoprim] Rash   Sulfa Antibiotics Rash   Sulfasalazine Rash   Tape Rash    Paper tape ok   Theophyllines Other (See Comments)    Headache      Past Medical History:  Diagnosis Date   Anemia    Arthritis    knees   Cerebral palsy (HCC)    Depression    Diabetes mellitus without complication (HCC)    Diverticulosis    Dysphagia    Eczema    GERD (gastroesophageal reflux disease)    Glaucoma    Hemorrhoids    Hyperlipidemia    Hypothyroidism    Seizures (HCC)    none in over 10 yrs    Review of systems:  Otherwise negative.    Physical Exam  Gen: Alert, oriented. Appears stated age.  HEENT: [place]/AT. PERRLA. Lungs: CTA, no wheezes. CV: RR nl S1, S2. Abd: soft, benign, no masses. BS+ Ext: No  edema. Pulses 2+    Planned procedures: Proceed with EGD and colonoscopy. The patient understands the nature of the planned procedure, indications, risks, alternatives and potential complications including but not limited to bleeding, infection, perforation, damage to internal organs and possible oversedation/side effects from anesthesia. The patient agrees and gives consent to proceed.  Please refer to procedure notes for findings, recommendations and patient disposition/instructions.     Denham Mose K. Corky Diener, M.D. Gastroenterology 08/05/2023  8:50 AM

## 2023-08-05 NOTE — Interval H&P Note (Signed)
 History and Physical Interval Note:  08/05/2023 8:54 AM  Michele James  has presented today for surgery, with the diagnosis of D50.9 (ICD-10-CM) - Iron  deficiency anemia, unspecified iron  deficiency anemia type R10.13 (ICD-10-CM) - Dyspepsia.  The various methods of treatment have been discussed with the patient and family. After consideration of risks, benefits and other options for treatment, the patient has consented to  Procedure(s) with comments: COLONOSCOPY WITH PROPOFOL  (N/A) - DM EGD (ESOPHAGOGASTRODUODENOSCOPY) (N/A) as a surgical intervention.  The patient's history has been reviewed, patient examined, no change in status, stable for surgery.  I have reviewed the patient's chart and labs.  Questions were answered to the patient's satisfaction.     [place], Michele James

## 2023-08-05 NOTE — Anesthesia Preprocedure Evaluation (Deleted)
 Anesthesia Evaluation    Airway        Dental   Pulmonary           Cardiovascular      Neuro/Psych    GI/Hepatic   Endo/Other    Renal/GU      Musculoskeletal   Abdominal   Peds  Hematology   Anesthesia Other Findings   Reproductive/Obstetrics                              Anesthesia Physical Anesthesia Plan Anesthesia Quick Evaluation

## 2023-08-05 NOTE — OR Nursing (Signed)
 Pt here for EGD, colonoscopy, did not finish all her prep, still having soft BM's. Will r/s to do both procedures another time.

## 2023-08-05 NOTE — Interval H&P Note (Signed)
 History and Physical Interval Note:  08/05/2023 9:19 AM  Michele James  has presented today for surgery, with the diagnosis of D50.9 (ICD-10-CM) - Iron  deficiency anemia, unspecified iron  deficiency anemia type R10.13 (ICD-10-CM) - Dyspepsia.  The various methods of treatment have been discussed with the patient and family. After consideration of risks, benefits and other options for treatment, the patient has consented to  Procedure(s) with comments: COLONOSCOPY WITH PROPOFOL  (N/A) - DM EGD (ESOPHAGOGASTRODUODENOSCOPY) (N/A) as a surgical intervention.  The patient's history has been reviewed, patient examined, no change in status, stable for surgery.  I have reviewed the patient's chart and labs.  Questions were answered to the patient's satisfaction.     [name], [name]  Addendum: Patient NOT CLEAR on bowel prep for colonoscopy. Father, who is caregiver, OPTS FOR RESCHEDULING BOTH EGD AND COLONOSCOPY AT THIS TIME.  PROCEDURES CANCELLED.  [name], M.D. ABIM Diplomate in Gastroenterology [facility]

## 2023-08-26 ENCOUNTER — Other Ambulatory Visit: Payer: Self-pay | Admitting: Psychiatry

## 2023-08-27 ENCOUNTER — Encounter: Payer: Self-pay | Admitting: Psychiatry

## 2023-08-27 ENCOUNTER — Telehealth: Payer: Self-pay | Admitting: Psychiatry

## 2023-08-27 NOTE — Telephone Encounter (Signed)
 Unable to reach patient by phone and no Mychart set up. Mailed letter requesting to call the office to schedule an appointment.

## 2023-08-27 NOTE — Telephone Encounter (Signed)
 Please contact the patient to schedule in person follow up.

## 2023-08-27 NOTE — Telephone Encounter (Signed)
Unable to reach patient by phone, letter mailed.

## 2023-09-02 ENCOUNTER — Encounter: Payer: Self-pay | Admitting: Oncology

## 2023-09-03 ENCOUNTER — Other Ambulatory Visit: Payer: Self-pay

## 2023-09-03 ENCOUNTER — Ambulatory Visit (INDEPENDENT_AMBULATORY_CARE_PROVIDER_SITE_OTHER): Admitting: Psychiatry

## 2023-09-03 ENCOUNTER — Other Ambulatory Visit (INDEPENDENT_AMBULATORY_CARE_PROVIDER_SITE_OTHER): Payer: Self-pay | Admitting: Vascular Surgery

## 2023-09-03 ENCOUNTER — Encounter: Payer: Self-pay | Admitting: Psychiatry

## 2023-09-03 VITALS — BP 130/74 | HR 88 | Temp 97.7°F | Ht 63.0 in

## 2023-09-03 DIAGNOSIS — I7025 Atherosclerosis of native arteries of other extremities with ulceration: Secondary | ICD-10-CM

## 2023-09-03 DIAGNOSIS — F3341 Major depressive disorder, recurrent, in partial remission: Secondary | ICD-10-CM | POA: Diagnosis not present

## 2023-09-03 DIAGNOSIS — F209 Schizophrenia, unspecified: Secondary | ICD-10-CM | POA: Diagnosis not present

## 2023-09-03 MED ORDER — OLANZAPINE 15 MG PO TABS: 15.0000 mg | ORAL_TABLET | Freq: Every day | ORAL | 0 refills | Status: AC

## 2023-09-03 NOTE — Progress Notes (Signed)
 [facility] MD/PA/NP OP Progress Note  09/03/2023 1:36 PM Michele James  MRN:  [id number]  Chief Complaint:  Chief Complaint  Patient presents with   Follow-up   HPI:  This is a follow-up appointment for schizophrenia, depression.  She states that her father cannot understand her (her father states that she occasionally has some hoarseness or issues with throat).  She has been doing good.  She denies feeling depressed or anxiety.  When discussing about the recent no shows, she states that she missed to take medication before lunch.  When she was asked if she ran out of her medication, she denies this, stating that she has been taking medication.  When she was asked if she sees any insects in the room, she glances around, and states that she ate two peanut butter. She then talks about the medication. She denies SI/HI, paranoia. She denies AH.   Her father presents to the visit.  He states that she has been sleeping most of the time in a wheelchair.  He is concerned that she may be getting slower and slower like an elderly person.  She does not want to go outside as she is afraid of insect.  She calls him to look for ticks, although there is nothing.  It has been going on for many years.  He denies any change in the interaction, or any behavior concern.  She has some memory issues, and he is also concerned about his own memory issues, referring to his age.  She takes medication consistently.  No safety concern.   Wt Readings from Last 3 Encounters:  06/29/23 158 lb (71.7 kg)  02/06/23 158 lb 11.7 oz (72 kg)  11/22/22 151 lb 3.2 oz (68.6 kg)     Visit Diagnosis:    ICD-10-CM   1. Schizophrenia, unspecified type (HCC)  F20.9     2. MDD (major depressive disorder), recurrent, in partial remission (HCC)  F33.41       Past Psychiatric History: Please see initial evaluation for full details. I have reviewed the history. No updates at this time.     Past Medical History:  Past Medical History:   Diagnosis Date   Anemia    Arthritis    knees   Cerebral palsy (HCC)    Depression    Diabetes mellitus without complication (HCC)    Diverticulosis    Dysphagia    Eczema    GERD (gastroesophageal reflux disease)    Glaucoma    Hemorrhoids    Hyperlipidemia    Hypothyroidism    Seizures (HCC)    none in over 10 yrs    Past Surgical History:  Procedure Laterality Date   BREAST BIOPSY Right 03/22/2020   stereo bx, ribbon clip,  FAT NECROSIS AND FIBROSIS   CATARACT EXTRACTION W/PHACO Right 02/06/2016   Procedure: CATARACT EXTRACTION PHACO AND INTRAOCULAR LENS PLACEMENT (IOC);  Surgeon: Annell Kidney, MD;  Location: [facility];  Service: Ophthalmology;  Laterality: Right;   CATARACT EXTRACTION W/PHACO Left 03/05/2016   Procedure: CATARACT EXTRACTION PHACO AND INTRAOCULAR LENS PLACEMENT (IOC);  Surgeon: Annell Kidney, MD;  Location: [facility];  Service: Ophthalmology;  Laterality: Left;   CHOLECYSTECTOMY N/A 11/16/2015   Procedure: LAPAROSCOPIC CHOLECYSTECTOMY;  Surgeon: Rhina Center III, MD;  Location: [facility] ORS;  Service: General;  Laterality: N/A;  attempted cholangiogram   COLONOSCOPY WITH PROPOFOL  N/A 12/25/2014   Procedure: COLONOSCOPY WITH PROPOFOL ;  Surgeon: Stephens Eis, MD;  Location: [facility] ENDOSCOPY;  Service: Gastroenterology;  Laterality: N/A;   COLONOSCOPY WITH PROPOFOL  N/A 04/14/2019   Procedure: COLONOSCOPY WITH PROPOFOL ;  Surgeon: Toledo, Alphonsus Jeans, MD;  Location: [facility] ENDOSCOPY;  Service: Gastroenterology;  Laterality: N/A;   COLONOSCOPY WITH PROPOFOL  N/A 04/20/2022   Procedure: COLONOSCOPY WITH PROPOFOL ;  Surgeon: Luke Salaam, MD;  Location: [facility] ENDOSCOPY;  Service: Gastroenterology;  Laterality: N/A;   ENDOSCOPIC RETROGRADE CHOLANGIOPANCREATOGRAPHY (ERCP) WITH PROPOFOL  N/A 11/18/2015   Procedure: ENDOSCOPIC RETROGRADE CHOLANGIOPANCREATOGRAPHY (ERCP) WITH PROPOFOL ;  Surgeon: Marnee Sink, MD;  Location: [facility] ENDOSCOPY;  Service: Endoscopy;   Laterality: N/A;   ESOPHAGOGASTRODUODENOSCOPY (EGD) WITH PROPOFOL  N/A 04/14/2019   Procedure: ESOPHAGOGASTRODUODENOSCOPY (EGD) WITH PROPOFOL ;  Surgeon: Toledo, Alphonsus Jeans, MD;  Location: [facility] ENDOSCOPY;  Service: Gastroenterology;  Laterality: N/A;   ESOPHAGOGASTRODUODENOSCOPY (EGD) WITH PROPOFOL  N/A 04/20/2022   Procedure: ESOPHAGOGASTRODUODENOSCOPY (EGD) WITH PROPOFOL ;  Surgeon: Luke Salaam, MD;  Location: [facility] ENDOSCOPY;  Service: Gastroenterology;  Laterality: N/A;   EYE SURGERY     Cataract Surgery    HERNIA REPAIR      Family Psychiatric History: Please see initial evaluation for full details. I have reviewed the history. No updates at this time.     Family History:  Family History  Problem Relation Age of Onset   High blood pressure Mother    Hyperlipidemia Mother    Diabetes Mellitus II Mother    Stroke Mother    Osteoporosis Mother    High blood pressure Father    Breast cancer Maternal Aunt    Stroke Maternal Grandfather    Diabetes Mellitus II Maternal Grandfather    Stroke Maternal Grandmother    Diabetes Mellitus II Maternal Grandmother     Social History:  Social History   Socioeconomic History   Marital status: Single    Spouse name: Not on file   Number of children: Not on file   Years of education: Not on file   Highest education level: Not on file  Occupational History   Not on file  Tobacco Use   Smoking status: Former    Current packs/day: 0.00    Types: Cigarettes    Quit date: 06/20/1980    Years since quitting: 43.2   Smokeless tobacco: Never   Tobacco comments:    smoked socially in early 1980s (reports for 4 months)  Vaping Use   Vaping status: Never Used  Substance and Sexual Activity   Alcohol  use: No   Drug use: No   Sexual activity: Not Currently  Other Topics Concern   Not on file  Social History Narrative   Not on file   Social Drivers of Health   Financial Resource Strain: Low Risk  (10/01/2022)   Received [facility]   Overall Financial Resource Strain (CARDIA)    Difficulty of Paying Living Expenses: Not hard at all  Food Insecurity: Patient Unable To Answer (02/06/2023)   Hunger Vital Sign    Worried About Running Out of Food in the Last Year: Patient unable to answer    Ran Out of Food in the Last Year: Patient unable to answer  Transportation Needs: Patient Unable To Answer (02/06/2023)   PRAPARE - Transportation    Lack of Transportation (Medical): Patient unable to answer    Lack of Transportation (Non-Medical): Patient unable to answer  Physical Activity: Not on file  Stress: Not on file  Social Connections: Not on file    Allergies:  Allergies  Allergen Reactions   Aminophylline Other (See Comments)  Headache   Cinnamon Other (See Comments)    Stomachache    Amoxicillin Rash   Latex Rash   Nsaids Rash   Penicillins Nausea And Vomiting   Potassium Nausea And Vomiting and Nausea Only   Potassium-Containing Compounds Nausea And Vomiting   Septra [Sulfamethoxazole-Trimethoprim] Rash   Sulfa Antibiotics Rash   Sulfasalazine Rash   Tape Rash    Paper tape ok   Theophyllines Other (See Comments)    Headache     Metabolic Disorder Labs: No results found for: "HGBA1C", "MPG" No results found for: "PROLACTIN" No results found for: "CHOL", "TRIG", "HDL", "CHOLHDL", "VLDL", "LDLCALC" Lab Results  Component Value Date   TSH 0.686 03/17/2021   TSH 0.172 (L) 05/16/2020    Therapeutic Level Labs: No results found for: "LITHIUM" No results found for: "VALPROATE" Lab Results  Component Value Date   CBMZ 6.0 02/06/2023    Current Medications: Current Outpatient Medications  Medication Sig Dispense Refill   acetaminophen  (TYLENOL ) 325 MG tablet Take 325 mg by mouth every 6 (six) hours as needed.     alendronate  (FOSAMAX ) 70 MG tablet Take 70 mg by mouth once a week. Take with a full glass of water on an empty stomach.     ALPRAZolam   (XANAX ) 0.25 MG tablet Take 0.25 mg by mouth as needed for anxiety. Before pap smears     atorvastatin  (LIPITOR) 40 MG tablet Take 40 mg by mouth daily.     calcium  citrate-vitamin D  (CITRACAL+D) 315-200 MG-UNIT per tablet Take 1 tablet by mouth 2 (two) times daily.     carbamazepine  (TEGRETOL ) 200 MG tablet Take 200 mg by mouth 3 (three) times daily.     Cholecalciferol  100 MCG (4000 UT) TABS Take 1,000 Units by mouth.     esomeprazole (NEXIUM) 40 MG capsule Take 40 mg by mouth daily at 12 noon.     famotidine  (PEPCID ) 20 MG tablet Take 20 mg by mouth 2 (two) times daily.     hydroxypropyl methylcellulose / hypromellose (ISOPTO TEARS / GONIOVISC) 2.5 % ophthalmic solution 1 drop as needed for dry eyes.     levothyroxine  (SYNTHROID ) 100 MCG tablet Take 100 mcg by mouth every morning.     magnesium  oxide (MAG-OX) 400 MG tablet Take 1 tablet by mouth 3 (three) times daily.     naproxen (NAPROSYN) 375 MG tablet Take 375 mg by mouth 2 (two) times daily with a meal.     OLANZapine  (ZYPREXA ) 10 MG tablet Take 1 tablet (10 mg total) by mouth at bedtime. 90 tablet 0   OLANZapine  (ZYPREXA ) 15 MG tablet Take 1 tablet (15 mg total) by mouth at bedtime. 90 tablet 0   PARoxetine  (PAXIL ) 20 MG tablet TAKE 1 TABLET BY MOUTH EVERY DAY 90 tablet 1   potassium chloride  20 MEQ/15ML (10%) SOLN Take 10 mEq by mouth daily.     vitamin E 400 UNIT capsule Take 400 Units by mouth daily.     iron  polysaccharides (NIFEREX) 150 MG capsule Take 1 capsule (150 mg total) by mouth 2 (two) times daily for 30 days, THEN 1 capsule (150 mg total) daily. 150 capsule 0   OLANZapine  (ZYPREXA ) 5 MG tablet Take 1 tablet (5 mg total) by mouth daily. 90 tablet 0   No current facility-administered medications for this visit.     Musculoskeletal: Strength & Muscle Tone: within normal limits Gait & Station: wheelchair Patient leans: N/A  Psychiatric Specialty Exam: Review of Systems  Psychiatric/Behavioral:  Positive for  hallucinations and sleep disturbance. Negative for agitation, behavioral problems, confusion, decreased concentration, dysphoric mood, self-injury and suicidal ideas. The patient is not nervous/anxious and is not hyperactive.   All other systems reviewed and are negative.   Blood pressure 130/74, pulse 88, temperature 97.7 F (36.5 C), temperature source Temporal, height 5\' 3"  (1.6 m).Body mass index is 27.99 kg/m.  General Appearance: Fairly Groomed  Eye Contact:  Good  Speech:  Clear and Coherent  Volume:  Normal  Mood:  fine  Affect:  Appropriate, Congruent, and Restricted  Thought Process:  Coherent  Orientation:  Full (Time, Place, and Person)  Thought Content: Logical   Suicidal Thoughts:  No  Homicidal Thoughts:  No  Memory:  Immediate;   Good  Judgement:  Fair  Insight:  Lacking  Psychomotor Activity:  Normal, no rigidity  Concentration:  Concentration: Good and Attention Span: Good  Recall:  Fair  Fund of Knowledge: Good  Language: Good  Akathisia:  No  Handed:  Right  AIMS (if indicated): 0 (unable to assess gait as she is in a wheelchair)  Assets:  Social Support  ADL's:  Impaired  Cognition: Impaired,  Mild  Sleep:  hypersomnia   Screenings: GAD-7    Loss adjuster, chartered Office Visit from 12/23/2021 in [facility]  Total GAD-7 Score 1      PHQ2-9    Flowsheet Row Office Visit from 02/03/2022 in [facility] Office Visit from 12/23/2021 in [facility] Office Visit from 11/04/2021 in [facility]  PHQ-2 Total Score 0 0 0      Flowsheet Row ED from 03/01/2023 in [facility] at [facility] ED to Hosp-Admission (Discharged) from 02/06/2023 in [facility] ED from 11/22/2022 in [facility] Emergency Department at [facility]  C-SSRS RISK CATEGORY No  Risk No Risk No Risk        Assessment and Plan:  MINYON BILLITER is a 68 y.o. year old female with a history of depression, anxiety, seizure disorder, cerebral palsy, GERD, osteoporosis , who presents for follow up appointment for below.   1. Schizophrenia, unspecified type (HCC) 2. MDD (major depressive disorder), recurrent, in partial remission (HCC) Acute stressors include: n/a Chronic stressors include: unemployment, limited care (elderly father, RN twice a week)   Transferred from [facility]/Dr. Cardell Chang. Admitted a few times, first at age 73 for depression.  According to the chart review, her father reports history of schizophrenia. No known developmental or intellectual disability. Collateral is limited due to her elderly father, who has memory loss. (Originally on olanzapine  2.5 mg in AM, 10 mg qhs, Paxil  20 mg daily)    Exam is notable for disorganized thought process, rumination on medication, although she is otherwise calm during the visit.  There is a concern about decreased activity during the day, although she does not appear to have any symptoms of catatonia.  Will consolidate olanzapine  to take at night to mitigate risk of drowsiness.  Noted that she VH of seeing insects, which has been chronic in nature, and the patient denies any significant distress from this. Noted that although metformin was advised for weight gain associated with antipsychotic use, her father is not interested in this.  Will continue current dose of Paxil  to target depression.    # cognitive impairment Mini cog- 1/3 (unable to tell date, wrote two 12 in clock) Functional Status   IADL:  Independent in the following:            Requires assistance with the following: managing finances, medications, driving ADL  Independent in the following: walking          Requires assistance with the following:bathing and hygiene, feeding, continence, grooming and toileting,  Folate, Vitamin B12 (wnl 42/2024), TSH low, fT4 0.99  05/2012) Images Head CT 02/2021 Brain: Normal anatomic configuration. Moderate periventricular white matter changes are present likely reflecting the sequela of small vessel ischemia, stable since prior examination. 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 No abnormal mass effect or midline shift. No evidence of acute intracranial hemorrhage or infarct. Ventricular size is normal. Cerebellum unremarkable. Neuropsych assessment:  Etiology: r/o vascular   No significant change.  IADL is limited primarily due to her condition of cerebral palsy.  Will continue to assess as needed.        Last checked  EKG HR 92, QTc415 msec 01/2023  Lipid panels wnl 03/2022  HbA1c 5.1 03/2023  Prolactin < 0.1 -  01/2023   Plan (uses [facility]) Change to: olanzapine  15 mg at night (used to be taking 5 mg in am, 10 mg qhs)  Continue Paxil  20 mg daily  Next appointment: 7/24 at 1 pm, IP - on carbamazepine  200 mg TID for seizure      The patient demonstrates the following risk factors for suicide: Chronic risk factors for suicide include: psychiatric disorder of depression . Acute risk factors for suicide include: unemployment. Protective factors for this patient include: positive social support and hope for the future. Considering these factors, the overall suicide risk at this point appears to be low. Patient is appropriate for outpatient follow up.   Collaboration of Care: Collaboration of Care: Other reviewed notes in Epic  Patient/Guardian was advised Release of Information must be obtained prior to any record release in order to collaborate their care with an outside provider. Patient/Guardian was advised if they have not already done so to contact the registration department to sign all necessary forms in order for us  to release information regarding their care.   Consent:  Patient/Guardian gives verbal consent for treatment and assignment of benefits for services provided during this visit. Patient/Guardian expressed understanding and agreed to proceed.    Todd Fossa, MD 09/03/2023, 1:36 PM

## 2023-09-03 NOTE — Patient Instructions (Signed)
 Change to: olanzapine  15 mg at night Continue Paxil  20 mg daily  Next appointment: 7/24 at 1 pm

## 2023-09-07 ENCOUNTER — Encounter (INDEPENDENT_AMBULATORY_CARE_PROVIDER_SITE_OTHER): Payer: Self-pay | Admitting: Vascular Surgery

## 2023-09-07 ENCOUNTER — Ambulatory Visit (INDEPENDENT_AMBULATORY_CARE_PROVIDER_SITE_OTHER): Admitting: Vascular Surgery

## 2023-09-07 ENCOUNTER — Encounter: Payer: Self-pay | Admitting: Oncology

## 2023-09-07 ENCOUNTER — Ambulatory Visit (INDEPENDENT_AMBULATORY_CARE_PROVIDER_SITE_OTHER)

## 2023-09-07 VITALS — BP 100/62 | HR 102

## 2023-09-07 DIAGNOSIS — J449 Chronic obstructive pulmonary disease, unspecified: Secondary | ICD-10-CM

## 2023-09-07 DIAGNOSIS — K219 Gastro-esophageal reflux disease without esophagitis: Secondary | ICD-10-CM

## 2023-09-07 DIAGNOSIS — I7025 Atherosclerosis of native arteries of other extremities with ulceration: Secondary | ICD-10-CM | POA: Diagnosis not present

## 2023-09-07 DIAGNOSIS — E782 Mixed hyperlipidemia: Secondary | ICD-10-CM

## 2023-09-07 NOTE — Progress Notes (Unsigned)
 MRN : [id number]  Michele James is a 68 y.o. (06/16/1955) female who presents with chief complaint of check circulation.  History of Present Illness:    The patient is seen for evaluation of painful lower extremities and diminished pulses. Patient and her husband note about 3 months ago at her podiatry visit she developed a sore and has been treated now for infection.  She was asked to follow-up with me to ensure that her perfusion was adequate for wound healing.  Overall her husband does feel that the wound is getting better.   The patient denies rest pain or dangling of an extremity off the side of the bed during the night for relief. No open wounds or sores at this time. No prior interventions or surgeries.   No history of back problems or DJD of the lumbar sacral spine.    The patient's blood pressure has been stable and relatively well controlled.  The patient denies amaurosis fugax or recent TIA symptoms. There are no recent neurological changes noted.  No history of DVT, PE or superficial thrombophlebitis.  The patient denies recent episodes of angina or shortness of breath.    ABIs done 06/29/2023 are essentially normal.  Rt=1.07 and Lt=1.09  TBI's Rt=0.97 and Lt=0.93.  This study is essentially unchanged from the previous ABI in 03/2023.  Duplex ultrasound of the right lower extremity arterial system obtained today demonstrates patency of the SFA popliteal and tibial arteries with uniform velocities.  No evidence of hemodynamically significant stenosis.  However because of the patient being wheelchair-bound the common femoral and aorta iliacs could not be assessed    No outpatient medications have been marked as taking for the 09/07/23 encounter (Appointment) with Prescilla Brod, Ninette Basque, MD.    Past Medical History:  Diagnosis Date   Anemia    Arthritis    knees   Cerebral palsy (HCC)    Depression     Diabetes mellitus without complication (HCC)    Diverticulosis    Dysphagia    Eczema    GERD (gastroesophageal reflux disease)    Glaucoma    Hemorrhoids    Hyperlipidemia    Hypothyroidism    Seizures (HCC)    none in over 10 yrs    Past Surgical History:  Procedure Laterality Date   BREAST BIOPSY Right 03/22/2020   stereo bx, ribbon clip,  FAT NECROSIS AND FIBROSIS   CATARACT EXTRACTION W/PHACO Right 02/06/2016   Procedure: CATARACT EXTRACTION PHACO AND INTRAOCULAR LENS PLACEMENT (IOC);  Surgeon: Annell Kidney, MD;  Location: [facility];  Service: Ophthalmology;  Laterality: Right;   CATARACT EXTRACTION W/PHACO Left 03/05/2016   Procedure: CATARACT EXTRACTION PHACO AND INTRAOCULAR LENS PLACEMENT (IOC);  Surgeon: Annell Kidney, MD;  Location: [facility];  Service: Ophthalmology;  Laterality: Left;   CHOLECYSTECTOMY N/A 11/16/2015   Procedure: LAPAROSCOPIC CHOLECYSTECTOMY;  Surgeon: Rhina Center III, MD;  Location: [facility];  Service: General;  Laterality: N/A;  attempted cholangiogram   COLONOSCOPY WITH PROPOFOL  N/A 12/25/2014   Procedure: COLONOSCOPY WITH PROPOFOL ;  Surgeon: Stephens Eis, MD;  Location: [facility];  Service: Gastroenterology;  Laterality: N/A;   COLONOSCOPY WITH PROPOFOL  N/A 04/14/2019   Procedure: COLONOSCOPY WITH PROPOFOL ;  Surgeon: Toledo, Alphonsus Jeans, MD;  Location: [facility];  Service: Gastroenterology;  Laterality: N/A;   COLONOSCOPY WITH PROPOFOL  N/A 04/20/2022   Procedure: COLONOSCOPY WITH PROPOFOL ;  Surgeon: Luke Salaam, MD;  Location: [facility];  Service: Gastroenterology;  Laterality: N/A;   ENDOSCOPIC RETROGRADE CHOLANGIOPANCREATOGRAPHY (ERCP) WITH PROPOFOL  N/A 11/18/2015   Procedure: ENDOSCOPIC RETROGRADE CHOLANGIOPANCREATOGRAPHY (ERCP) WITH PROPOFOL ;  Surgeon: Marnee Sink, MD;  Location: [facility];  Service: Endoscopy;  Laterality: N/A;   ESOPHAGOGASTRODUODENOSCOPY (EGD) WITH PROPOFOL  N/A 04/14/2019    Procedure: ESOPHAGOGASTRODUODENOSCOPY (EGD) WITH PROPOFOL ;  Surgeon: Toledo, Alphonsus Jeans, MD;  Location: [facility];  Service: Gastroenterology;  Laterality: N/A;   ESOPHAGOGASTRODUODENOSCOPY (EGD) WITH PROPOFOL  N/A 04/20/2022   Procedure: ESOPHAGOGASTRODUODENOSCOPY (EGD) WITH PROPOFOL ;  Surgeon: Luke Salaam, MD;  Location: [facility];  Service: Gastroenterology;  Laterality: N/A;   EYE SURGERY     Cataract Surgery    HERNIA REPAIR      Social History Social History   Tobacco Use   Smoking status: Former    Current packs/day: 0.00    Types: Cigarettes    Quit date: 06/20/1980    Years since quitting: 43.2   Smokeless tobacco: Never   Tobacco comments:    smoked socially in early 1980s (reports for 4 months)  Vaping Use   Vaping status: Never Used  Substance Use Topics   Alcohol  use: No   Drug use: No    Family History Family History  Problem Relation Age of Onset   High blood pressure Mother    Hyperlipidemia Mother    Diabetes Mellitus II Mother    Stroke Mother    Osteoporosis Mother    High blood pressure Father    Breast cancer Maternal Aunt    Stroke Maternal Grandfather    Diabetes Mellitus II Maternal Grandfather    Stroke Maternal Grandmother    Diabetes Mellitus II Maternal Grandmother     Allergies  Allergen Reactions   Aminophylline Other (See Comments)    Headache   Cinnamon Other (See Comments)    Stomachache    Amoxicillin Rash   Latex Rash   Nsaids Rash   Penicillins Nausea And Vomiting   Potassium Nausea And Vomiting and Nausea Only   Potassium-Containing Compounds Nausea And Vomiting   Septra [Sulfamethoxazole-Trimethoprim] Rash   Sulfa Antibiotics Rash   Sulfasalazine Rash   Tape Rash    Paper tape ok   Theophyllines Other (See Comments)    Headache      REVIEW OF SYSTEMS (Negative unless checked)  Constitutional: [] Weight loss  [] Fever  [] Chills Cardiac: [] Chest pain   [] Chest pressure   [] Palpitations   [] Shortness of  breath when laying flat   [] Shortness of breath with exertion. Vascular:  [x] Pain in legs with walking   [] Pain in legs at rest  [] History of DVT   [] Phlebitis   [] Swelling in legs   [] Varicose veins   [] Non-healing ulcers Pulmonary:   [] Uses home oxygen   [] Productive cough   [] Hemoptysis   [] Wheeze  [] COPD   [] Asthma Neurologic:  [] Dizziness   [] Seizures   [] History of stroke   [] History of TIA  [] Aphasia   [] Vissual changes   [] Weakness or numbness in arm   [] Weakness or numbness in leg Musculoskeletal:   [] Joint swelling   [] Joint pain   [] Low back pain Hematologic:  [] Easy bruising  [] Easy bleeding   [] Hypercoagulable state   []   Anemic Gastrointestinal:  [] Diarrhea   [] Vomiting  [x] Gastroesophageal reflux/heartburn   [] Difficulty swallowing. Genitourinary:  [] Chronic kidney disease   [] Difficult urination  [] Frequent urination   [] Blood in urine Skin:  [] Rashes   [] Ulcers  Psychological:  [] History of anxiety   []  History of major depression.  Physical Examination  There were no vitals filed for this visit. There is no height or weight on file to calculate BMI. Gen: WD/WN, NAD Head: [place]/AT, No temporalis wasting.  Ear/Nose/Throat: Hearing grossly intact, nares w/o erythema or drainage Eyes: PER, EOMI, sclera nonicteric.  Neck: Supple, no masses.  No bruit or JVD.  Pulmonary:  Good air movement, no audible wheezing, no use of accessory muscles.  Cardiac: RRR, normal S1, S2, no Murmurs. Vascular:  mild trophic changes, + open wounds right foot and ankle Vessel Right Left  Radial Palpable Palpable  PT Not Palpable Not Palpable  DP Not Palpable Not Palpable  Gastrointestinal: soft, non-distended. No guarding/no peritoneal signs.  Musculoskeletal: M/S 5/5 throughout.  No visible deformity.  Neurologic: CN 2-12 intact. Pain and light touch intact in extremities.  Symmetrical.  Speech is fluent. Motor exam as listed above. Psychiatric: Judgment intact, Mood & affect appropriate for pt's  clinical situation. Dermatologic: No rashes + ulcers noted.  No changes consistent with cellulitis.   CBC Lab Results  Component Value Date   WBC 5.0 05/26/2023   HGB 10.9 (L) 05/26/2023   HCT 34.7 (L) 05/26/2023   MCV 99.7 05/26/2023   PLT 319 05/26/2023    BMET    Component Value Date/Time   NA 138 03/01/2023 1426   K 3.5 03/01/2023 1426   CL 102 03/01/2023 1426   CO2 28 03/01/2023 1426   GLUCOSE 140 (H) 03/01/2023 1426   BUN 14 03/01/2023 1426   CREATININE 0.71 03/01/2023 1426   CALCIUM  9.0 03/01/2023 1426   GFRNONAA >60 03/01/2023 1426   GFRAA >60 07/15/2019 1719   CrCl cannot be calculated (Patient's most recent lab result is older than the maximum 21 days allowed.).  COAG Lab Results  Component Value Date   INR 1.1 10/18/2022    Radiology No results found.   Assessment/Plan 1. Atherosclerosis of native arteries of the extremities with ulceration (HCC) (Primary) Recommend:  The patient is status post noninvasive evaluation of the right lower extremity but this could not fully evaluate the patient's iliofemoral system secondary to her being wheelchair-bound. The patient reports that the wound and leg pain are unchanged and continue to be a major issue.   The patient continues to voice lifestyle limiting changes at this point in time.  Patient should undergo CT angiogram as ordered. The patient will follow up with me after the studies.  The possibility that the patient may need further angiography was reviewed.  In the mean time the patient should continue an exercise program.  The patient should continue antiplatelet therapy and aggressive treatment of the lipid abnormalities   - CT ANGIO AO+BIFEM W & OR WO CONTRAST; Future  2. Chronic obstructive pulmonary disease, unspecified COPD type (HCC) Continue pulmonary medications and aerosols as already ordered, these medications have been reviewed and there are no changes at this time.   3. Gastroesophageal  reflux disease, unspecified whether esophagitis present Continue PPI as already ordered, this medication has been reviewed and there are no changes at this time.  Avoidence of caffeine and alcohol   Moderate elevation of the head of the bed   4. Mixed hyperlipidemia Continue statin as ordered and reviewed,  no changes at this time    Devon Fogo, MD  09/07/2023 11:09 AM

## 2023-09-09 ENCOUNTER — Encounter (INDEPENDENT_AMBULATORY_CARE_PROVIDER_SITE_OTHER): Payer: Self-pay | Admitting: Vascular Surgery

## 2023-09-13 ENCOUNTER — Encounter: Payer: Self-pay | Admitting: Oncology

## 2023-09-15 ENCOUNTER — Encounter: Payer: Self-pay | Admitting: Oncology

## 2023-09-17 ENCOUNTER — Other Ambulatory Visit: Payer: Self-pay | Admitting: Psychiatry

## 2023-09-18 ENCOUNTER — Telehealth: Payer: Self-pay

## 2023-09-18 NOTE — Telephone Encounter (Signed)
 Called spoke to the patients father he stated that the patient is taking the Olanzapine  15 mg

## 2023-09-18 NOTE — Telephone Encounter (Signed)
 Decline- She was advised to take the olanzapine  15 mg tablet at her last visit, and that order was sent in at that time.

## 2023-09-18 NOTE — Telephone Encounter (Signed)
 Received fax requesting refill of OLANZapine  (ZYPREXA ) 5 MG tablet prescription has expired and need a new one on file for next refill    Last visit 09-03-23 Next visit 10-22-23  Preferred pharmacy [id number] [place], [place] - [address] AT [facility] [address] Phone: [phone]  Fax: [phone]

## 2023-09-22 ENCOUNTER — Inpatient Hospital Stay: Payer: Medicare Other | Attending: Oncology

## 2023-09-22 ENCOUNTER — Ambulatory Visit
Admission: RE | Admit: 2023-09-22 | Discharge: 2023-09-22 | Disposition: A | Discharge status: Home / Self Care | Source: Ambulatory Visit | Attending: Vascular Surgery | Admitting: Vascular Surgery

## 2023-09-22 DIAGNOSIS — D500 Iron deficiency anemia secondary to blood loss (chronic): Secondary | ICD-10-CM | POA: Insufficient documentation

## 2023-09-22 DIAGNOSIS — K2970 Gastritis, unspecified, without bleeding: Secondary | ICD-10-CM | POA: Insufficient documentation

## 2023-09-22 DIAGNOSIS — R911 Solitary pulmonary nodule: Secondary | ICD-10-CM | POA: Diagnosis not present

## 2023-09-22 DIAGNOSIS — G809 Cerebral palsy, unspecified: Secondary | ICD-10-CM | POA: Insufficient documentation

## 2023-09-22 DIAGNOSIS — Z803 Family history of malignant neoplasm of breast: Secondary | ICD-10-CM | POA: Diagnosis not present

## 2023-09-22 DIAGNOSIS — J189 Pneumonia, unspecified organism: Secondary | ICD-10-CM | POA: Diagnosis not present

## 2023-09-22 DIAGNOSIS — K449 Diaphragmatic hernia without obstruction or gangrene: Secondary | ICD-10-CM | POA: Insufficient documentation

## 2023-09-22 DIAGNOSIS — I7025 Atherosclerosis of native arteries of other extremities with ulceration: Secondary | ICD-10-CM | POA: Insufficient documentation

## 2023-09-22 DIAGNOSIS — R0602 Shortness of breath: Secondary | ICD-10-CM | POA: Diagnosis not present

## 2023-09-22 DIAGNOSIS — I110 Hypertensive heart disease with heart failure: Secondary | ICD-10-CM | POA: Diagnosis not present

## 2023-09-22 LAB — IRON AND TIBC
Iron: 56 ug/dL (ref 28–170)
Saturation Ratios: 14 % (ref 10.4–31.8)
TIBC: 388 ug/dL (ref 250–450)
UIBC: 332 ug/dL

## 2023-09-22 LAB — CBC WITH DIFFERENTIAL (CANCER CENTER ONLY)
Abs Immature Granulocytes: 0.03 10*3/uL (ref 0.00–0.07)
Basophils Absolute: 0.1 10*3/uL (ref 0.0–0.1)
Basophils Relative: 1 %
Eosinophils Absolute: 0.2 10*3/uL (ref 0.0–0.5)
Eosinophils Relative: 3 %
HCT: 39.7 % (ref 36.0–46.0)
Hemoglobin: 12.6 g/dL (ref 12.0–15.0)
Immature Granulocytes: 0 %
Lymphocytes Relative: 11 %
Lymphs Abs: 1 10*3/uL (ref 0.7–4.0)
MCH: 31.3 pg (ref 26.0–34.0)
MCHC: 31.7 g/dL (ref 30.0–36.0)
MCV: 98.8 fL (ref 80.0–100.0)
Monocytes Absolute: 1.4 10*3/uL — ABNORMAL HIGH (ref 0.1–1.0)
Monocytes Relative: 17 %
Neutro Abs: 5.8 10*3/uL (ref 1.7–7.7)
Neutrophils Relative %: 68 %
Platelet Count: 267 10*3/uL (ref 150–400)
RBC: 4.02 MIL/uL (ref 3.87–5.11)
RDW: 14 % (ref 11.5–15.5)
WBC Count: 8.4 10*3/uL (ref 4.0–10.5)
nRBC: 0 % (ref 0.0–0.2)

## 2023-09-22 LAB — FERRITIN: Ferritin: 21 ng/mL (ref 11–307)

## 2023-09-22 LAB — RETIC PANEL
Immature Retic Fract: 15.3 % (ref 2.3–15.9)
RBC.: 3.96 MIL/uL (ref 3.87–5.11)
Retic Count, Absolute: 99.4 10*3/uL (ref 19.0–186.0)
Retic Ct Pct: 2.5 % (ref 0.4–3.1)
Reticulocyte Hemoglobin: 26 pg — ABNORMAL LOW (ref >27.9)

## 2023-09-22 LAB — POCT I-STAT CREATININE: Creatinine, Ser: 0.6 mg/dL (ref 0.44–1.00)

## 2023-09-22 MED ORDER — IOHEXOL 350 MG/ML SOLN
100.0000 mL | Freq: Once | INTRAVENOUS | Status: AC | PRN
  Administered 2023-09-22: 100 mL via INTRAVENOUS

## 2023-09-23 ENCOUNTER — Encounter: Payer: Self-pay | Admitting: Oncology

## 2023-09-23 ENCOUNTER — Inpatient Hospital Stay: Payer: Medicare Other

## 2023-09-23 ENCOUNTER — Inpatient Hospital Stay (HOSPITAL_BASED_OUTPATIENT_CLINIC_OR_DEPARTMENT_OTHER): Payer: Medicare Other | Admitting: Oncology

## 2023-09-23 VITALS — BP 116/66 | HR 97 | Temp 99.4°F | Resp 18

## 2023-09-23 DIAGNOSIS — D500 Iron deficiency anemia secondary to blood loss (chronic): Secondary | ICD-10-CM

## 2023-09-23 NOTE — Progress Notes (Signed)
 Hematology/Oncology Progress note [phone] [phone]        CHIEF COMPLAINTS/REASON FOR VISIT:  IDA  ASSESSMENT & PLAN:   Iron  deficiency anemia due to chronic blood loss Lab Results  Component Value Date   HGB 12.6 09/22/2023   TIBC 388 09/22/2023   IRONPCTSAT 14 09/22/2023   FERRITIN 21 09/22/2023    Hold Venofer .  Recommend oral iron  supplementation 1 tab every other day for 2 months as maintenance..    Orders Placed This Encounter  Procedures   CBC with Differential (Cancer Center Only)    Standing Status:   Future    Expected Date:   03/24/2024    Expiration Date:   06/22/2024   Iron  and TIBC    Standing Status:   Future    Expected Date:   03/24/2024    Expiration Date:   06/22/2024   Ferritin    Standing Status:   Future    Expected Date:   03/24/2024    Expiration Date:   06/22/2024   Retic Panel    Standing Status:   Future    Expected Date:   03/24/2024    Expiration Date:   06/22/2024   Follow-up 6 months.  All questions were answered. The patient knows to call the clinic with any problems, questions or concerns.  Zelphia Cap, MD, PhD [facility] Hematology Oncology 09/23/2023   HISTORY OF PRESENTING ILLNESS:   Patient has a history of seizure disorder/history of cerebral palsy Patient is a poor historian.  She was accompanied by her mother who has hearing deficiency and not able to provide much history.[name]   History was mainly obtained from reviewing medical records.  06/14/2020, CT chest with contrast showed a lobulated nodule with mildly spiculated margin in the posterior right chest persist and is suspicious for bronchogenic neoplasm based on morphologic repeat years Nodule along the minor fissure in the right chest measures 6 mm, nonspecific.  Moderately large hiatal hernia 11 mm distal common bile duct, previously 8 to 9 mm.  No pancreatic duct dilatation is visualized.  Prominence of the common bile duct on the prior study  in this patient's post cholecystectomy.  Aortic atherosclerosis.  Patient was referred to establish care with oncology for further evaluation of the lung nodule.  She denies any cough, shortness of breath, hemoptysis, patient is a former smoker, and quit in 1982.  She reports only smoked for 4 months and mostly on weekends.  Patient has contractures and physical deformities which severely limits her  mobility.  She lives at home with parents.   Lung nodule  07/02/2020 no activity on PET scan. 01/04/2021, CT chest without contrast showed stable size of the lung nodule.  Likely benign. 01/07/22 CT chest without contrast showed showed stable/slightly smaller 1.4 cm posterior right lower lobe pulmonary nodule.  Likely benign.  New right upper lobe groundglass nodule with patchy groundglass opacity.   07/17/2022 CT chest wo contrast showed  1. No suspicious pulmonary nodules. Previously demonstrated nodules are stable to decreased in size, consistent with benign findings. 2. Patchy ground-glass opacities in both lungs have mildly progressed posteriorly in the right upper lobe, but are non focal  and likely inflammatory/postinflammatory. 3. Moderate to large hiatal hernia, mildly increased in size from previous imaging. 4. No adenopathy or pleural effusion. 5. Aortic Atherosclerosis (ICD10-I70.0) and Emphysema   04/13/22 EGD showed gastritis, 04/21/22 colonoscopy negative. Small bowel enteroscopy negative.   INTERVAL HISTORY SAKSHI SERMONS is a 68 y.o. female  who has above history reviewed by me today presents for acute visit for anemia   Today she was accompanied by her father. She is a poor historian.  Per father, patient was not able to complete colonoscopy. Denies blood in stool, abdominal pain, weight loss  Review of Systems  Unable to perform ROS: Other  Constitutional:  Positive for appetite change. Negative for fatigue and unexpected weight change.  Respiratory:  Negative for cough and  shortness of breath.   Cardiovascular:  Negative for chest pain.  Gastrointestinal:  Negative for abdominal pain.  Musculoskeletal:  Negative for back pain.  Hematological:  Does not bruise/bleed easily.  Psychiatric/Behavioral:  Negative for confusion.     MEDICAL HISTORY:  Past Medical History:  Diagnosis Date   Anemia    Arthritis    knees   Cerebral palsy (HCC)    Depression    Diabetes mellitus without complication (HCC)    Diverticulosis    Dysphagia    Eczema    GERD (gastroesophageal reflux disease)    Glaucoma    Hemorrhoids    Hyperlipidemia    Hypothyroidism    Seizures (HCC)    none in over 10 yrs    SURGICAL HISTORY: Past Surgical History:  Procedure Laterality Date   BREAST BIOPSY Right 03/22/2020   stereo bx, ribbon clip,  FAT NECROSIS AND FIBROSIS   CATARACT EXTRACTION W/PHACO Right 02/06/2016   Procedure: CATARACT EXTRACTION PHACO AND INTRAOCULAR LENS PLACEMENT (IOC);  Surgeon: Dene Etienne, MD;  Location: [facility];  Service: Ophthalmology;  Laterality: Right;   CATARACT EXTRACTION W/PHACO Left 03/05/2016   Procedure: CATARACT EXTRACTION PHACO AND INTRAOCULAR LENS PLACEMENT (IOC);  Surgeon: Dene Etienne, MD;  Location: [facility];  Service: Ophthalmology;  Laterality: Left;   CHOLECYSTECTOMY N/A 11/16/2015   Procedure: LAPAROSCOPIC CHOLECYSTECTOMY;  Surgeon: Elgin Laurence III, MD;  Location: [facility];  Service: General;  Laterality: N/A;  attempted cholangiogram   COLONOSCOPY WITH PROPOFOL  N/A 12/25/2014   Procedure: COLONOSCOPY WITH PROPOFOL ;  Surgeon: Deward CINDERELLA Piedmont, MD;  Location: [facility];  Service: Gastroenterology;  Laterality: N/A;   COLONOSCOPY WITH PROPOFOL  N/A 04/14/2019   Procedure: COLONOSCOPY WITH PROPOFOL ;  Surgeon: Toledo, Ladell POUR, MD;  Location: [facility] ENDOSCOPY;  Service: Gastroenterology;  Laterality: N/A;   COLONOSCOPY WITH PROPOFOL  N/A 04/20/2022   Procedure: COLONOSCOPY WITH PROPOFOL ;  Surgeon: Therisa Bi, MD;  Location: [facility];  Service: Gastroenterology;  Laterality: N/A;   ENDOSCOPIC RETROGRADE CHOLANGIOPANCREATOGRAPHY (ERCP) WITH PROPOFOL  N/A 11/18/2015   Procedure: ENDOSCOPIC RETROGRADE CHOLANGIOPANCREATOGRAPHY (ERCP) WITH PROPOFOL ;  Surgeon: Rogelia Copping, MD;  Location: [facility] ENDOSCOPY;  Service: Endoscopy;  Laterality: N/A;   ESOPHAGOGASTRODUODENOSCOPY (EGD) WITH PROPOFOL  N/A 04/14/2019   Procedure: ESOPHAGOGASTRODUODENOSCOPY (EGD) WITH PROPOFOL ;  Surgeon: Toledo, Ladell POUR, MD;  Location: [facility] ENDOSCOPY;  Service: Gastroenterology;  Laterality: N/A;   ESOPHAGOGASTRODUODENOSCOPY (EGD) WITH PROPOFOL  N/A 04/20/2022   Procedure: ESOPHAGOGASTRODUODENOSCOPY (EGD) WITH PROPOFOL ;  Surgeon: Therisa Bi, MD;  Location: [facility] ENDOSCOPY;  Service: Gastroenterology;  Laterality: N/A;   EYE SURGERY     Cataract Surgery    HERNIA REPAIR      SOCIAL HISTORY: Social History   Socioeconomic History   Marital status: Single    Spouse name: Not on file   Number of children: Not on file   Years of education: Not on file   Highest education level: Not on file  Occupational History   Not on file  Tobacco Use   Smoking status: Former    Current packs/day: 0.00  Types: Cigarettes    Quit date: 06/20/1980    Years since quitting: 43.2   Smokeless tobacco: Never   Tobacco comments:    smoked socially in early 1980s (reports for 4 months)  Vaping Use   Vaping status: Never Used  Substance and Sexual Activity   Alcohol  use: No   Drug use: No   Sexual activity: Not Currently  Other Topics Concern   Not on file  Social History Narrative   Not on file   Social Drivers of Health   Financial Resource Strain: Low Risk  (10/01/2022)   Received from [facility]   Overall Financial Resource Strain (CARDIA)    Difficulty of Paying Living Expenses: Not hard at all  Food Insecurity: Patient Unable To Answer (02/06/2023)   Hunger Vital Sign    Worried About Running Out of  Food in the Last Year: Patient unable to answer    Ran Out of Food in the Last Year: Patient unable to answer  Transportation Needs: Patient Unable To Answer (02/06/2023)   PRAPARE - Transportation    Lack of Transportation (Medical): Patient unable to answer    Lack of Transportation (Non-Medical): Patient unable to answer  Physical Activity: Not on file  Stress: Not on file  Social Connections: Not on file  Intimate Partner Violence: Patient Unable To Answer (02/06/2023)   Humiliation, Afraid, Rape, and Kick questionnaire    Fear of Current or Ex-Partner: Patient unable to answer    Emotionally Abused: Patient unable to answer    Physically Abused: Patient unable to answer    Sexually Abused: Patient unable to answer    FAMILY HISTORY: Family History  Problem Relation Age of Onset   High blood pressure Mother    Hyperlipidemia Mother    Diabetes Mellitus II Mother    Stroke Mother    Osteoporosis Mother    High blood pressure Father    Breast cancer Maternal Aunt    Stroke Maternal Grandfather    Diabetes Mellitus II Maternal Grandfather    Stroke Maternal Grandmother    Diabetes Mellitus II Maternal Grandmother     ALLERGIES:  is allergic to aminophylline, cinnamon, amoxicillin, latex, nsaids, penicillins, potassium, potassium-containing compounds, septra [sulfamethoxazole-trimethoprim], sulfa antibiotics, sulfasalazine, tape, and theophyllines.  MEDICATIONS:  Current Outpatient Medications  Medication Sig Dispense Refill   acetaminophen  (TYLENOL ) 325 MG tablet Take 325 mg by mouth every 6 (six) hours as needed.     alendronate  (FOSAMAX ) 70 MG tablet Take 70 mg by mouth once a week. Take with a full glass of water on an empty stomach.     ALPRAZolam  (XANAX ) 0.25 MG tablet Take 0.25 mg by mouth as needed for anxiety. Before pap smears     atorvastatin  (LIPITOR) 40 MG tablet Take 40 mg by mouth daily.     calcium  citrate-vitamin D  (CITRACAL+D) 315-200 MG-UNIT per tablet Take  1 tablet by mouth 2 (two) times daily.     carbamazepine  (TEGRETOL ) 200 MG tablet Take 200 mg by mouth 3 (three) times daily.     Cholecalciferol  100 MCG (4000 UT) TABS Take 1,000 Units by mouth.     esomeprazole (NEXIUM) 40 MG capsule Take 40 mg by mouth daily at 12 noon.     famotidine  (PEPCID ) 20 MG tablet Take 20 mg by mouth 2 (two) times daily.     hydroxypropyl methylcellulose / hypromellose (ISOPTO TEARS / GONIOVISC) 2.5 % ophthalmic solution 1 drop as needed for dry eyes.  iron  polysaccharides (NIFEREX) 150 MG capsule Take 1 capsule (150 mg total) by mouth 2 (two) times daily for 30 days, THEN 1 capsule (150 mg total) daily. 150 capsule 0   levothyroxine  (SYNTHROID ) 100 MCG tablet Take 100 mcg by mouth every morning.     magnesium  oxide (MAG-OX) 400 MG tablet Take 1 tablet by mouth 3 (three) times daily.     naproxen (NAPROSYN) 375 MG tablet Take 375 mg by mouth 2 (two) times daily with a meal.     OLANZapine  (ZYPREXA ) 10 MG tablet Take 1 tablet (10 mg total) by mouth at bedtime. 90 tablet 0   OLANZapine  (ZYPREXA ) 15 MG tablet Take 1 tablet (15 mg total) by mouth at bedtime. 90 tablet 0   OLANZapine  (ZYPREXA ) 5 MG tablet Take 1 tablet (5 mg total) by mouth daily. 90 tablet 0   PARoxetine  (PAXIL ) 20 MG tablet TAKE 1 TABLET BY MOUTH EVERY DAY 90 tablet 1   potassium chloride  20 MEQ/15ML (10%) SOLN Take 10 mEq by mouth daily.     vitamin E 400 UNIT capsule Take 400 Units by mouth daily.     No current facility-administered medications for this visit.     PHYSICAL EXAMINATION: ECOG PERFORMANCE STATUS: 2 - Symptomatic, <50% confined to bed Vitals:   09/23/23 1339  BP: 116/66  Pulse: 97  Resp: 18  Temp: 99.4 F (37.4 C)  SpO2: 90%   There were no vitals filed for this visit.    Physical Exam Constitutional:      General: She is not in acute distress.    Comments: Patient sits in the wheelchair  HENT:     Head: Normocephalic and atraumatic.   Eyes:     General: No  scleral icterus.   Cardiovascular:     Rate and Rhythm: Normal rate and regular rhythm.     Heart sounds: Normal heart sounds.  Pulmonary:     Effort: Pulmonary effort is normal. No respiratory distress.     Breath sounds: No wheezing.  Abdominal:     General: Bowel sounds are normal. There is no distension.     Palpations: Abdomen is soft.   Musculoskeletal:        General: Deformity present.     Cervical back: Normal range of motion and neck supple.     Comments: Contracture of left upper extremity in the left lower extremity.   Skin:    General: Skin is warm and dry.     Coloration: Skin is pale.     Findings: No erythema or rash.   Neurological:     Mental Status: She is alert. Mental status is at baseline.     LABORATORY DATA:  I have reviewed the data as listed    Latest Ref Rng & Units 09/22/2023   10:48 AM 05/26/2023   11:22 AM 03/01/2023    2:26 PM  CBC  WBC 4.0 - 10.5 K/uL 8.4  5.0  4.9   Hemoglobin 12.0 - 15.0 g/dL 87.3  89.0  89.5   Hematocrit 36.0 - 46.0 % 39.7  34.7  34.6   Platelets 150 - 400 K/uL 267  319  350       Latest Ref Rng & Units 09/22/2023   12:29 PM 03/01/2023    2:26 PM 02/11/2023    4:44 AM  CMP  Glucose 70 - 99 mg/dL  859  893   BUN 8 - 23 mg/dL  14  7   Creatinine 9.55 -  1.00 mg/dL 9.39  9.28  9.55   Sodium 135 - 145 mmol/L  138  134   Potassium 3.5 - 5.1 mmol/L  3.5  4.1   Chloride 98 - 111 mmol/L  102  102   CO2 22 - 32 mmol/L  28  27   Calcium  8.9 - 10.3 mg/dL  9.0  8.6   Total Protein 6.5 - 8.1 g/dL  6.0    Total Bilirubin <1.2 mg/dL  0.2    Alkaline Phos 38 - 126 U/L  66    AST 15 - 41 U/L  16    ALT 0 - 44 U/L  12        RADIOGRAPHIC STUDIES: I have personally reviewed the radiological images as listed and agreed with the findings in the report. CT ANGIO AO+BIFEM W & OR WO CONTRAST Result Date: 09/23/2023 CLINICAL DATA:  Lower extremity claudication with soft tissue ulceration EXAM: CT ANGIOGRAPHY OF ABDOMINAL AORTA  WITH ILIOFEMORAL RUNOFF TECHNIQUE: Multidetector CT imaging of the abdomen, pelvis and lower extremities was performed using the standard protocol during bolus administration of intravenous contrast. Multiplanar CT image reconstructions and MIPs were obtained to evaluate the vascular anatomy. RADIATION DOSE REDUCTION: This exam was performed according to the departmental dose-optimization program which includes automated exposure control, adjustment of the mA and/or kV according to patient size and/or use of iterative reconstruction technique. CONTRAST:  OMNIPAQUE  IOHEXOL  350 MG/ML SOLN COMPARISON:  Prior PET-CT 07/02/2020 FINDINGS: VASCULAR Aorta: Normal caliber aorta without aneurysm, dissection, vasculitis or significant stenosis. Celiac: Patent without evidence of aneurysm, dissection, vasculitis or significant stenosis. SMA: Patent without evidence of aneurysm, dissection, vasculitis or significant stenosis. Renals: Both main renal arteries are patent without evidence of aneurysm, dissection, vasculitis, fibromuscular dysplasia or significant stenosis. Small accessory artery to the right kidney. IMA: Patent without evidence of aneurysm, dissection, vasculitis or significant stenosis. RIGHT Lower Extremity Inflow: Common, internal and external iliac arteries are patent without evidence of aneurysm, dissection, vasculitis or significant stenosis. Outflow: Common, superficial and profunda femoral arteries and the popliteal artery are patent without evidence of aneurysm, dissection, vasculitis or significant stenosis. Runoff: Patent three vessel runoff to the ankle. LEFT Lower Extremity Inflow: Common, internal and external iliac arteries are patent without evidence of aneurysm, dissection, vasculitis or significant stenosis. Outflow: Common, superficial and profunda femoral arteries and the popliteal artery are patent without evidence of aneurysm, dissection, vasculitis or significant stenosis. Runoff:  Patent three vessel runoff to the ankle. Veins: No focal venous abnormality. Review of the MIP images confirms the above findings. NON-VASCULAR Lower chest: Marked enlargement of the main pulmonary artery at 3.2 cm suggesting pulmonary arterial hypertension. Cardiomegaly with right heart enlargement. No pericardial effusion. Moderate right and trace left pleural effusions. Large sliding hiatal hernia. Hepatobiliary: No focal liver abnormality is seen. Status post cholecystectomy. No biliary dilatation. Pancreas: Unremarkable. No pancreatic ductal dilatation or surrounding inflammatory changes. Spleen: Normal in size without focal abnormality. Adrenals/Urinary Tract: Adrenal glands are unremarkable. Kidneys are normal, without renal calculi, focal lesion, or hydronephrosis. Bladder is unremarkable. Stomach/Bowel: Large volume of formed stool in the rectum with a rectal stool ball measuring at least 9 x 7 x 6 cm. There is mild rectal wall thickening. No evidence of bowel obstruction. Normal appendix. Abnormal thickening of a distal loop of ileum in the low anatomic pelvis immediately adjacent to the large abscess collection. Lymphatic: No suspicious lymphadenopathy. Reproductive: Apparent postsurgical changes of hysterectomy. The uterus is no longer visualized although it was  present on prior PET imaging from April of 2022. Other: Thick walled fluid and gas collection in the recto vesicular recess measures approximately 6.8 x 4.4 x 4.4 cm. Diffuse diastasis recti. Small fat containing ventral abdominal hernia. 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 Inward rotation of the left lower extremity with severe degenerative arthritis. Marked reticulation of the subcutaneous fat affecting the body wall and bilateral lower extremities consistent with edema. IMPRESSION: VASCULAR 1. No evidence of hemodynamically significant arterial stenosis or occlusion. No evidence of significant  peripheral arterial disease. 2. Trace aortic atherosclerotic vascular calcifications. Aortic Atherosclerosis (ICD10-I70.0). NON-VASCULAR 1. Thick walled fluid and gas collection in the recto vesicular recess within the pelvic cul-de-sac consistent with an abscess measuring 6.8 x 4.4 x 4.4 cm. There is an associated adjacent thick walled loop of small bowel which may represent chronic secondary inflammatory reaction if the source of the abscess is related to a prior hysterectomy, a source of chronic small bowel perforation, or potentially even lymphomatous involvement of the small bowel with contained perforation. 2. Extensive anasarca and bilateral lower extremity edema. Findings are favored to be related to elevated right heart pressures in the setting of pulmonary arterial hypertension. 3. Moderate right and trace left pleural effusions. 4. Moderate to large sliding hiatal hernia. 5. 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 6. Inward rotation of the left lower extremity with advanced knee joint degenerative osteoarthritis. These results will be called to the ordering clinician or representative by the Radiologist Assistant, and communication documented in the PACS or [employer]. Electronically Signed   By: Wilkie Lent M.D.   On: 09/23/2023 09:04   VAS US  LOWER EXTREMITY ARTERIAL DUPLEX Result Date: 09/07/2023 LOWER EXTREMITY ARTERIAL DUPLEX STUDY Patient Name:  [name] GOODLIN  Date of Exam:   09/07/2023 Medical Rec #: [id number]       Accession #:    [id number] Date of Birth: 09-23-1955       Patient Gender: F Patient Age:   89 years Exam Location:  [facility] Procedure:      VAS US  LOWER EXTREMITY ARTERIAL DUPLEX Referring Phys: CORDELLA SHAWL --------------------------------------------------------------------------------  Indications: Ulceration.  Current ABI: not done Limitations: Patient is unable to stand; exam was performed with patient upright               in a wheelchair Comparison Study: ABI on 06/29/23: Right=1.27(TBI=0.97) & Left=1.28(TBI=0.93) Performing Technologist: [name] Churn RT, RDMS, RVT  Examination Guidelines: A complete evaluation includes B-mode imaging, spectral Doppler, color Doppler, and power Doppler as needed of all accessible portions of each vessel. Bilateral testing is considered an integral part of a complete examination. Limited examinations for reoccurring indications may be performed as noted.  +-----------+--------+-----+--------+---------+-------------+ RIGHT      PSV cm/sRatioStenosisWaveform Comments      +-----------+--------+-----+--------+---------+-------------+ CFA Mid                                  not evaluated +-----------+--------+-----+--------+---------+-------------+ SFA Prox                                 not evaluated +-----------+--------+-----+--------+---------+-------------+ SFA Mid    77                   biphasic               +-----------+--------+-----+--------+---------+-------------+  SFA Distal 73                   biphasic               +-----------+--------+-----+--------+---------+-------------+ POP Prox   70                   biphasic               +-----------+--------+-----+--------+---------+-------------+ POP Distal 64                   triphasic              +-----------+--------+-----+--------+---------+-------------+ ATA Distal 36                   biphasic               +-----------+--------+-----+--------+---------+-------------+ PTA Distal 62                   triphasic              +-----------+--------+-----+--------+---------+-------------+ PERO Distal30                   biphasic               +-----------+--------+-----+--------+---------+-------------+ Limited evaluation of the right lower extremity due to limited patient mobility.  +--------------+-------+-----------+--------+--------+-----+--------+ Left PoplitealAP  (cm)Transv (cm)WaveformStenosisShapeComments +--------------+-------+-----------+--------+--------+-----+--------+ Distal                                                        +--------------+-------+-----------+--------+--------+-----+--------+  Summary: Right: Patent SFA, popliteal and tibial arteries without evidence of significant stenosis. No evidence of significant arterial occlusive disease in the proximal vessels, based on Doppler waveforms and previous ABI.  See table(s) above for measurements and observations. Electronically signed by Cordella Shawl MD on 09/07/2023 at 5:25:09 PM.   Final

## 2023-09-23 NOTE — Assessment & Plan Note (Addendum)
 Lab Results  Component Value Date   HGB 12.6 09/22/2023   TIBC 388 09/22/2023   IRONPCTSAT 14 09/22/2023   FERRITIN 21 09/22/2023    Hold Venofer .  Recommend oral iron  supplementation 1 tab every other day for 2 months as maintenance.[name]

## 2023-09-25 ENCOUNTER — Other Ambulatory Visit: Payer: Self-pay

## 2023-09-25 ENCOUNTER — Emergency Department

## 2023-09-25 ENCOUNTER — Inpatient Hospital Stay
Admission: EM | Admit: 2023-09-25 | Discharge: 2023-10-05 | DRG: 208 | Disposition: A | Discharge status: Hospice | Attending: Obstetrics and Gynecology | Admitting: Obstetrics and Gynecology

## 2023-09-25 DIAGNOSIS — I5041 Acute combined systolic (congestive) and diastolic (congestive) heart failure: Secondary | ICD-10-CM | POA: Diagnosis present

## 2023-09-25 DIAGNOSIS — Z79890 Hormone replacement therapy (postmenopausal): Secondary | ICD-10-CM

## 2023-09-25 DIAGNOSIS — Z803 Family history of malignant neoplasm of breast: Secondary | ICD-10-CM

## 2023-09-25 DIAGNOSIS — R0602 Shortness of breath: Secondary | ICD-10-CM | POA: Diagnosis present

## 2023-09-25 DIAGNOSIS — E1151 Type 2 diabetes mellitus with diabetic peripheral angiopathy without gangrene: Secondary | ICD-10-CM | POA: Diagnosis present

## 2023-09-25 DIAGNOSIS — I272 Pulmonary hypertension, unspecified: Secondary | ICD-10-CM | POA: Diagnosis present

## 2023-09-25 DIAGNOSIS — Z1152 Encounter for screening for COVID-19: Secondary | ICD-10-CM | POA: Diagnosis not present

## 2023-09-25 DIAGNOSIS — G40909 Epilepsy, unspecified, not intractable, without status epilepticus: Secondary | ICD-10-CM | POA: Diagnosis present

## 2023-09-25 DIAGNOSIS — Z8669 Personal history of other diseases of the nervous system and sense organs: Secondary | ICD-10-CM

## 2023-09-25 DIAGNOSIS — J9601 Acute respiratory failure with hypoxia: Secondary | ICD-10-CM | POA: Diagnosis not present

## 2023-09-25 DIAGNOSIS — A419 Sepsis, unspecified organism: Secondary | ICD-10-CM | POA: Diagnosis not present

## 2023-09-25 DIAGNOSIS — B952 Enterococcus as the cause of diseases classified elsewhere: Secondary | ICD-10-CM | POA: Diagnosis present

## 2023-09-25 DIAGNOSIS — J9602 Acute respiratory failure with hypercapnia: Secondary | ICD-10-CM | POA: Diagnosis not present

## 2023-09-25 DIAGNOSIS — K219 Gastro-esophageal reflux disease without esophagitis: Secondary | ICD-10-CM | POA: Diagnosis not present

## 2023-09-25 DIAGNOSIS — R0902 Hypoxemia: Principal | ICD-10-CM

## 2023-09-25 DIAGNOSIS — I5031 Acute diastolic (congestive) heart failure: Secondary | ICD-10-CM | POA: Diagnosis not present

## 2023-09-25 DIAGNOSIS — F419 Anxiety disorder, unspecified: Secondary | ICD-10-CM | POA: Diagnosis not present

## 2023-09-25 DIAGNOSIS — R4701 Aphasia: Secondary | ICD-10-CM | POA: Diagnosis present

## 2023-09-25 DIAGNOSIS — I69820 Aphasia following other cerebrovascular disease: Secondary | ICD-10-CM

## 2023-09-25 DIAGNOSIS — G809 Cerebral palsy, unspecified: Secondary | ICD-10-CM | POA: Diagnosis present

## 2023-09-25 DIAGNOSIS — R569 Unspecified convulsions: Secondary | ICD-10-CM | POA: Diagnosis not present

## 2023-09-25 DIAGNOSIS — Z515 Encounter for palliative care: Secondary | ICD-10-CM | POA: Diagnosis not present

## 2023-09-25 DIAGNOSIS — Z8262 Family history of osteoporosis: Secondary | ICD-10-CM

## 2023-09-25 DIAGNOSIS — K2970 Gastritis, unspecified, without bleeding: Secondary | ICD-10-CM | POA: Diagnosis present

## 2023-09-25 DIAGNOSIS — J9811 Atelectasis: Secondary | ICD-10-CM | POA: Diagnosis present

## 2023-09-25 DIAGNOSIS — D509 Iron deficiency anemia, unspecified: Secondary | ICD-10-CM | POA: Diagnosis not present

## 2023-09-25 DIAGNOSIS — N390 Urinary tract infection, site not specified: Secondary | ICD-10-CM | POA: Diagnosis present

## 2023-09-25 DIAGNOSIS — J690 Pneumonitis due to inhalation of food and vomit: Secondary | ICD-10-CM | POA: Diagnosis present

## 2023-09-25 DIAGNOSIS — E119 Type 2 diabetes mellitus without complications: Secondary | ICD-10-CM

## 2023-09-25 DIAGNOSIS — M7989 Other specified soft tissue disorders: Secondary | ICD-10-CM

## 2023-09-25 DIAGNOSIS — J189 Pneumonia, unspecified organism: Principal | ICD-10-CM | POA: Diagnosis present

## 2023-09-25 DIAGNOSIS — B961 Klebsiella pneumoniae [K. pneumoniae] as the cause of diseases classified elsewhere: Secondary | ICD-10-CM | POA: Diagnosis present

## 2023-09-25 DIAGNOSIS — I739 Peripheral vascular disease, unspecified: Secondary | ICD-10-CM

## 2023-09-25 DIAGNOSIS — Z880 Allergy status to penicillin: Secondary | ICD-10-CM

## 2023-09-25 DIAGNOSIS — Z823 Family history of stroke: Secondary | ICD-10-CM

## 2023-09-25 DIAGNOSIS — F32A Depression, unspecified: Secondary | ICD-10-CM | POA: Diagnosis present

## 2023-09-25 DIAGNOSIS — I429 Cardiomyopathy, unspecified: Secondary | ICD-10-CM | POA: Diagnosis present

## 2023-09-25 DIAGNOSIS — Z66 Do not resuscitate: Secondary | ICD-10-CM | POA: Diagnosis not present

## 2023-09-25 DIAGNOSIS — I110 Hypertensive heart disease with heart failure: Secondary | ICD-10-CM | POA: Diagnosis present

## 2023-09-25 DIAGNOSIS — K222 Esophageal obstruction: Secondary | ICD-10-CM | POA: Diagnosis present

## 2023-09-25 DIAGNOSIS — E11621 Type 2 diabetes mellitus with foot ulcer: Secondary | ICD-10-CM | POA: Diagnosis present

## 2023-09-25 DIAGNOSIS — Z87891 Personal history of nicotine dependence: Secondary | ICD-10-CM

## 2023-09-25 DIAGNOSIS — Z882 Allergy status to sulfonamides status: Secondary | ICD-10-CM

## 2023-09-25 DIAGNOSIS — J90 Pleural effusion, not elsewhere classified: Secondary | ICD-10-CM | POA: Diagnosis not present

## 2023-09-25 DIAGNOSIS — Z6826 Body mass index (BMI) 26.0-26.9, adult: Secondary | ICD-10-CM

## 2023-09-25 DIAGNOSIS — E039 Hypothyroidism, unspecified: Secondary | ICD-10-CM | POA: Diagnosis present

## 2023-09-25 DIAGNOSIS — I2489 Other forms of acute ischemic heart disease: Secondary | ICD-10-CM | POA: Diagnosis present

## 2023-09-25 DIAGNOSIS — I509 Heart failure, unspecified: Secondary | ICD-10-CM | POA: Diagnosis not present

## 2023-09-25 DIAGNOSIS — D649 Anemia, unspecified: Secondary | ICD-10-CM | POA: Diagnosis present

## 2023-09-25 DIAGNOSIS — E785 Hyperlipidemia, unspecified: Secondary | ICD-10-CM | POA: Diagnosis present

## 2023-09-25 DIAGNOSIS — Z833 Family history of diabetes mellitus: Secondary | ICD-10-CM

## 2023-09-25 DIAGNOSIS — K449 Diaphragmatic hernia without obstruction or gangrene: Secondary | ICD-10-CM | POA: Diagnosis present

## 2023-09-25 DIAGNOSIS — G928 Other toxic encephalopathy: Secondary | ICD-10-CM | POA: Diagnosis not present

## 2023-09-25 DIAGNOSIS — Z91048 Other nonmedicinal substance allergy status: Secondary | ICD-10-CM

## 2023-09-25 DIAGNOSIS — F2081 Schizophreniform disorder: Secondary | ICD-10-CM | POA: Diagnosis present

## 2023-09-25 DIAGNOSIS — E669 Obesity, unspecified: Secondary | ICD-10-CM | POA: Diagnosis present

## 2023-09-25 DIAGNOSIS — L97419 Non-pressure chronic ulcer of right heel and midfoot with unspecified severity: Secondary | ICD-10-CM | POA: Diagnosis present

## 2023-09-25 DIAGNOSIS — Z993 Dependence on wheelchair: Secondary | ICD-10-CM

## 2023-09-25 DIAGNOSIS — Z83438 Family history of other disorder of lipoprotein metabolism and other lipidemia: Secondary | ICD-10-CM

## 2023-09-25 DIAGNOSIS — Z881 Allergy status to other antibiotic agents status: Secondary | ICD-10-CM

## 2023-09-25 DIAGNOSIS — K5710 Diverticulosis of small intestine without perforation or abscess without bleeding: Secondary | ICD-10-CM | POA: Diagnosis present

## 2023-09-25 DIAGNOSIS — Z7983 Long term (current) use of bisphosphonates: Secondary | ICD-10-CM

## 2023-09-25 DIAGNOSIS — Z91040 Latex allergy status: Secondary | ICD-10-CM

## 2023-09-25 DIAGNOSIS — G9341 Metabolic encephalopathy: Secondary | ICD-10-CM | POA: Diagnosis not present

## 2023-09-25 DIAGNOSIS — H409 Unspecified glaucoma: Secondary | ICD-10-CM | POA: Diagnosis present

## 2023-09-25 DIAGNOSIS — Z79899 Other long term (current) drug therapy: Secondary | ICD-10-CM

## 2023-09-25 DIAGNOSIS — Z7401 Bed confinement status: Secondary | ICD-10-CM

## 2023-09-25 LAB — COMPREHENSIVE METABOLIC PANEL WITH GFR
ALT: 33 U/L (ref 0–44)
AST: 23 U/L (ref 15–41)
Albumin: 3.2 g/dL — ABNORMAL LOW (ref 3.5–5.0)
Alkaline Phosphatase: 85 U/L (ref 38–126)
Anion gap: 7 (ref 5–15)
BUN: 26 mg/dL — ABNORMAL HIGH (ref 8–23)
CO2: 28 mmol/L (ref 22–32)
Calcium: 8.6 mg/dL — ABNORMAL LOW (ref 8.9–10.3)
Chloride: 99 mmol/L (ref 98–111)
Creatinine, Ser: 0.49 mg/dL (ref 0.44–1.00)
GFR, Estimated: >60 mL/min (ref >60)
Glucose, Bld: 103 mg/dL — ABNORMAL HIGH (ref 70–99)
Potassium: 5.4 mmol/L — ABNORMAL HIGH (ref 3.5–5.1)
Sodium: 134 mmol/L — ABNORMAL LOW (ref 135–145)
Total Bilirubin: 0.8 mg/dL (ref 0.0–1.2)
Total Protein: 6.2 g/dL — ABNORMAL LOW (ref 6.5–8.1)

## 2023-09-25 LAB — CBC WITH DIFFERENTIAL/PLATELET
Abs Immature Granulocytes: 0.02 10*3/uL (ref 0.00–0.07)
Basophils Absolute: 0.1 10*3/uL (ref 0.0–0.1)
Basophils Relative: 1 %
Eosinophils Absolute: 0.2 10*3/uL (ref 0.0–0.5)
Eosinophils Relative: 4 %
HCT: 37.9 % (ref 36.0–46.0)
Hemoglobin: 11.9 g/dL — ABNORMAL LOW (ref 12.0–15.0)
Immature Granulocytes: 0 %
Lymphocytes Relative: 14 %
Lymphs Abs: 0.8 10*3/uL (ref 0.7–4.0)
MCH: 31.1 pg (ref 26.0–34.0)
MCHC: 31.4 g/dL (ref 30.0–36.0)
MCV: 99 fL (ref 80.0–100.0)
Monocytes Absolute: 0.8 10*3/uL (ref 0.1–1.0)
Monocytes Relative: 13 %
Neutro Abs: 4 10*3/uL (ref 1.7–7.7)
Neutrophils Relative %: 68 %
Platelets: 276 10*3/uL (ref 150–400)
RBC: 3.83 MIL/uL — ABNORMAL LOW (ref 3.87–5.11)
RDW: 14.3 % (ref 11.5–15.5)
WBC: 5.9 10*3/uL (ref 4.0–10.5)
nRBC: 0 % (ref 0.0–0.2)

## 2023-09-25 LAB — TROPONIN I (HIGH SENSITIVITY)
Troponin I (High Sensitivity): 12 ng/L (ref <18)
Troponin I (High Sensitivity): 13 ng/L
Troponin I (High Sensitivity): 13 ng/L (ref <18)

## 2023-09-25 LAB — URINALYSIS, W/ REFLEX TO CULTURE (INFECTION SUSPECTED)
Bilirubin Urine: NEGATIVE
Glucose, UA: NEGATIVE mg/dL
Hgb urine dipstick: NEGATIVE
Ketones, ur: NEGATIVE mg/dL
Nitrite: NEGATIVE
Protein, ur: 100 mg/dL — AB
Specific Gravity, Urine: 1.029 (ref 1.005–1.030)
Squamous Epithelial / HPF: 0 /HPF (ref 0–5)
WBC, UA: >50 WBC/hpf (ref 0–5)
pH: 5 (ref 5.0–8.0)

## 2023-09-25 LAB — TSH: TSH: 1.091 u[IU]/mL (ref 0.350–4.500)

## 2023-09-25 LAB — PROCALCITONIN: Procalcitonin: <0.1 ng/mL

## 2023-09-25 LAB — RESP PANEL BY RT-PCR (RSV, FLU A&B, COVID)  RVPGX2
Influenza A by PCR: NEGATIVE
Influenza B by PCR: NEGATIVE
Resp Syncytial Virus by PCR: NEGATIVE
SARS Coronavirus 2 by RT PCR: NEGATIVE

## 2023-09-25 LAB — BRAIN NATRIURETIC PEPTIDE: B Natriuretic Peptide: 1406.2 pg/mL — ABNORMAL HIGH (ref 0.0–100.0)

## 2023-09-25 LAB — STREP PNEUMONIAE URINARY ANTIGEN: Strep Pneumo Urinary Antigen: NEGATIVE

## 2023-09-25 MED ORDER — SODIUM CHLORIDE 0.9 % IV SOLN
2.0000 g | INTRAVENOUS | Status: DC
  Administered 2023-09-26: 2 g via INTRAVENOUS
  Filled 2023-09-25 (×2): qty 20

## 2023-09-25 MED ORDER — PAROXETINE HCL 20 MG PO TABS
20.0000 mg | ORAL_TABLET | Freq: Every day | ORAL | Status: DC
  Administered 2023-09-25 – 2023-09-26 (×2): 20 mg via ORAL
  Filled 2023-09-25 (×3): qty 1

## 2023-09-25 MED ORDER — CARBAMAZEPINE 200 MG PO TABS
200.0000 mg | ORAL_TABLET | Freq: Three times a day (TID) | ORAL | Status: DC
  Administered 2023-09-25 – 2023-09-26 (×5): 200 mg via ORAL
  Filled 2023-09-25 (×7): qty 1

## 2023-09-25 MED ORDER — PANTOPRAZOLE SODIUM 40 MG PO TBEC
40.0000 mg | DELAYED_RELEASE_TABLET | Freq: Every day | ORAL | Status: DC
  Administered 2023-09-25 – 2023-09-26 (×2): 40 mg via ORAL
  Filled 2023-09-25 (×2): qty 1

## 2023-09-25 MED ORDER — SODIUM CHLORIDE 0.9 % IV SOLN
500.0000 mg | Freq: Once | INTRAVENOUS | Status: AC
  Administered 2023-09-25: 500 mg via INTRAVENOUS
  Filled 2023-09-25: qty 5

## 2023-09-25 MED ORDER — HEPARIN SODIUM (PORCINE) 5000 UNIT/ML IJ SOLN
5000.0000 [IU] | Freq: Three times a day (TID) | INTRAMUSCULAR | Status: DC
  Administered 2023-09-25 – 2023-10-05 (×31): 5000 [IU] via SUBCUTANEOUS
  Filled 2023-09-25 (×31): qty 1

## 2023-09-25 MED ORDER — ALPRAZOLAM 0.25 MG PO TABS: 0.2500 mg | ORAL_TABLET | ORAL | Status: DC | PRN

## 2023-09-25 MED ORDER — IOHEXOL 350 MG/ML SOLN
75.0000 mL | Freq: Once | INTRAVENOUS | Status: AC | PRN
  Administered 2023-09-25: 75 mL via INTRAVENOUS

## 2023-09-25 MED ORDER — OLANZAPINE 5 MG PO TABS
25.0000 mg | ORAL_TABLET | Freq: Every day | ORAL | Status: DC
  Administered 2023-09-25 – 2023-09-26 (×2): 25 mg via ORAL
  Filled 2023-09-25 (×2): qty 1

## 2023-09-25 MED ORDER — SODIUM CHLORIDE 0.9 % IV SOLN
1.0000 g | Freq: Once | INTRAVENOUS | Status: AC
  Administered 2023-09-25: 1 g via INTRAVENOUS
  Filled 2023-09-25: qty 10

## 2023-09-25 MED ORDER — ONDANSETRON HCL 4 MG PO TABS: 4.0000 mg | ORAL_TABLET | Freq: Four times a day (QID) | ORAL | Status: DC | PRN

## 2023-09-25 MED ORDER — FUROSEMIDE 10 MG/ML IJ SOLN
20.0000 mg | Freq: Once | INTRAMUSCULAR | Status: AC
  Administered 2023-09-25: 20 mg via INTRAVENOUS
  Filled 2023-09-25: qty 4

## 2023-09-25 MED ORDER — ACETAMINOPHEN 325 MG PO TABS
325.0000 mg | ORAL_TABLET | Freq: Four times a day (QID) | ORAL | Status: DC | PRN
  Administered 2023-10-05: 325 mg via ORAL
  Filled 2023-09-25: qty 1

## 2023-09-25 MED ORDER — OLANZAPINE 5 MG PO TABS
5.0000 mg | ORAL_TABLET | Freq: Every day | ORAL | Status: DC
  Administered 2023-09-25 – 2023-09-26 (×2): 5 mg via ORAL
  Filled 2023-09-25 (×3): qty 1

## 2023-09-25 MED ORDER — FAMOTIDINE 20 MG PO TABS
20.0000 mg | ORAL_TABLET | Freq: Two times a day (BID) | ORAL | Status: DC
  Administered 2023-09-25 – 2023-09-26 (×3): 20 mg via ORAL
  Filled 2023-09-25 (×3): qty 1

## 2023-09-25 MED ORDER — POLYSACCHARIDE IRON COMPLEX 150 MG PO CAPS
150.0000 mg | ORAL_CAPSULE | Freq: Every day | ORAL | Status: DC
  Administered 2023-09-25 – 2023-09-26 (×2): 150 mg via ORAL
  Filled 2023-09-25 (×3): qty 1

## 2023-09-25 MED ORDER — ONDANSETRON HCL 4 MG/2ML IJ SOLN
4.0000 mg | Freq: Four times a day (QID) | INTRAMUSCULAR | Status: DC | PRN
Start: 2023-09-25 — End: 2023-10-05

## 2023-09-25 MED ORDER — FUROSEMIDE 10 MG/ML IJ SOLN
20.0000 mg | Freq: Two times a day (BID) | INTRAMUSCULAR | Status: DC
  Administered 2023-09-25 – 2023-09-26 (×3): 20 mg via INTRAVENOUS
  Filled 2023-09-25 (×2): qty 4

## 2023-09-25 MED ORDER — SODIUM CHLORIDE 0.9 % IV SOLN
100.0000 mg | Freq: Two times a day (BID) | INTRAVENOUS | Status: DC
  Administered 2023-09-25: 100 mg via INTRAVENOUS
  Filled 2023-09-25 (×2): qty 100

## 2023-09-25 MED ORDER — INSULIN ASPART 100 UNIT/ML IJ SOLN: 0.0000 [IU] | Freq: Three times a day (TID) | INTRAMUSCULAR | Status: DC

## 2023-09-25 MED ORDER — ATORVASTATIN CALCIUM 20 MG PO TABS
40.0000 mg | ORAL_TABLET | Freq: Every day | ORAL | Status: DC
  Administered 2023-09-25 – 2023-09-26 (×2): 40 mg via ORAL
  Filled 2023-09-25 (×2): qty 2

## 2023-09-25 MED ORDER — LEVOTHYROXINE SODIUM 100 MCG PO TABS
100.0000 ug | ORAL_TABLET | Freq: Every day | ORAL | Status: DC
  Administered 2023-09-26: 100 ug via ORAL
  Filled 2023-09-25: qty 1

## 2023-09-25 NOTE — H&P (Signed)
 History and Physical    Michele James FMW:[id number] DOB: [birth year] DOA: 09/25/2023  PCP: Glover Lenis, MD  Patient coming from: home  I have personally briefly reviewed patient's old medical records in [facility]  Chief Complaint: sob   HPI: Michele James is a 68 y.o. female with medical history significant of  IDA, GERD, DMII, Depression, Glaucoma, HLD, hypothyrodism , Seizures, cerebral palsy,PVD,schizophreniform disorder who presents to ED with sob/productive cough and b/l lower extremity swelling. In the field EMS noted sat 90% on ra she was placed on 2L [place] and transported to ED. Patient states she has been ill x 1 days.  She notes no fever no chills.  She is not a reliable historian.  ED Course:  Labs Wbc 5.9, hgb 11.9 ( 12.6), plt 276 BNP 1406 ( 440) Na 134 (138), K 5.4, cr 0.49,  RI87,86,86 RVP-neg UA:+many bacteria, wbc>50,  Cxr: IMPRESSION: 1. Low inspiration. 2. Small pleural effusions. 3. Patchy consolidation or atelectasis in both lower lung fields. 4. Large hiatal hernia. 5. Aortic atherosclerosis.  CTPE IMPRESSION: 1. No convincing evidence of pulmonary embolism. Streaky indistinct areas of low-attenuation throughout the main, right and left pulmonary arteries, favor mixing artifact. 2. Small to moderate right and small left layering pleural effusions. Moderate dependent bibasilar atelectasis. 3. Mosaic attenuation throughout both lungs, as can be seen with air trapping from small airways disease versus mosaic perfusion from pulmonary vascular disease.  Tx zithromax , CTX,lasix   Review of Systems: As per HPI otherwise 10 point review of systems negative.   Past Medical History:  Diagnosis Date   Anemia    Arthritis    knees   Cerebral palsy (HCC)    Depression    Diabetes mellitus without complication (HCC)    Diverticulosis    Dysphagia    Eczema    GERD (gastroesophageal reflux disease)    Glaucoma    Hemorrhoids    Hyperlipidemia     Hypothyroidism    Seizures (HCC)    none in over 10 yrs    Past Surgical History:  Procedure Laterality Date   BREAST BIOPSY Right 03/22/2020   stereo bx, ribbon clip,  FAT NECROSIS AND FIBROSIS   CATARACT EXTRACTION W/PHACO Right 02/06/2016   Procedure: CATARACT EXTRACTION PHACO AND INTRAOCULAR LENS PLACEMENT (IOC);  Surgeon: Dene Etienne, MD;  Location: [facility];  Service: Ophthalmology;  Laterality: Right;   CATARACT EXTRACTION W/PHACO Left 03/05/2016   Procedure: CATARACT EXTRACTION PHACO AND INTRAOCULAR LENS PLACEMENT (IOC);  Surgeon: Dene Etienne, MD;  Location: [facility];  Service: Ophthalmology;  Laterality: Left;   CHOLECYSTECTOMY N/A 11/16/2015   Procedure: LAPAROSCOPIC CHOLECYSTECTOMY;  Surgeon: Elgin Laurence III, MD;  Location: [facility] ORS;  Service: General;  Laterality: N/A;  attempted cholangiogram   COLONOSCOPY WITH PROPOFOL  N/A 12/25/2014   Procedure: COLONOSCOPY WITH PROPOFOL ;  Surgeon: Deward CINDERELLA Piedmont, MD;  Location: [facility] END[facility]COPY;  Service: Gastroenterology;  Laterality: N/A;   COLONOSCOPY WITH PROPOFOL  N/A 04/14/2019   Procedure: COLONOSCOPY WITH PROPOFOL ;  Surgeon: Toledo, Ladell POUR, MD;  Location: [facility] ENDOSCOPY;  Service: Gastroenterology;  Laterality: N/A;   COLONOSCOPY WITH PROPOFOL  N/A 04/20/2022   Procedure: COLONOSCOPY WITH PROPOFOL ;  Surgeon: Therisa Bi, MD;  Location: [facility] ENDOSCOPY;  Service: Gastroenterology;  Laterality: N/A;   ENDOSCOPIC RETROGRADE CHOLANGIOPANCREATOGRAPHY (ERCP) WITH PROPOFOL  N/A 11/18/2015   Procedure: ENDOSCOPIC RETROGRADE CHOLANGIOPANCREATOGRAPHY (ERCP) WITH PROPOFOL ;  Surgeon: Rogelia Copping, MD;  Location: [facility] ENDOSCOPY;  Service: Endoscopy;  Laterality: N/A;   ESOPHAGOGASTRODUODENOSCOPY (EGD) WITH PROPOFOL   N/A 04/14/2019   Procedure: ESOPHAGOGASTRODUODENOSCOPY (EGD) WITH PROPOFOL ;  Surgeon: Toledo, Ladell POUR, MD;  Location: [facility] ENDOSCOPY;  Service: Gastroenterology;  Laterality: N/A;    ESOPHAGOGASTRODUODENOSCOPY (EGD) WITH PROPOFOL  N/A 04/20/2022   Procedure: ESOPHAGOGASTRODUODENOSCOPY (EGD) WITH PROPOFOL ;  Surgeon: Therisa Bi, MD;  Location: [facility] ENDOSCOPY;  Service: Gastroenterology;  Laterality: N/A;   EYE SURGERY     Cataract Surgery    HERNIA REPAIR       reports that she quit smoking about 43 years ago. Her smoking use included cigarettes. She has never used smokeless tobacco. She reports that she does not drink alcohol  and does not use drugs.  Allergies  Allergen Reactions   Aminophylline Other (See Comments)    Headache   Cinnamon Other (See Comments)    Stomachache    Amoxicillin Rash   Latex Rash   Nsaids Rash   Penicillins Nausea And Vomiting   Potassium Nausea And Vomiting and Nausea Only   Potassium-Containing Compounds Nausea And Vomiting   Septra [Sulfamethoxazole-Trimethoprim] Rash   Sulfa Antibiotics Rash   Sulfasalazine Rash   Tape Rash    Paper tape ok   Theophyllines Other (See Comments)    Headache     Family History  Problem Relation Age of Onset   High blood pressure Mother    Hyperlipidemia Mother    Diabetes Mellitus II Mother    Stroke Mother    Osteoporosis Mother    High blood pressure Father    Breast cancer Maternal Aunt    Stroke Maternal Grandfather    Diabetes Mellitus II Maternal Grandfather    Stroke Maternal Grandmother    Diabetes Mellitus II Maternal Grandmother     Prior to Admission medications   Medication Sig Start Date End Date Taking? Authorizing Provider  acetaminophen  (TYLENOL ) 325 MG tablet Take 325 mg by mouth every 6 (six) hours as needed.    [provider]  alendronate  (FOSAMAX ) 70 MG tablet Take 70 mg by mouth once a week. Take with a full glass of water on an empty stomach.    [provider]  ALPRAZolam  (XANAX ) 0.25 MG tablet Take 0.25 mg by mouth as needed for anxiety. Before pap smears    [provider]  atorvastatin  (LIPITOR) 40 MG tablet Take 40 mg by mouth  daily.    [provider]  calcium  citrate-vitamin D  (CITRACAL+D) 315-200 MG-UNIT per tablet Take 1 tablet by mouth 2 (two) times daily.    [provider]  carbamazepine  (TEGRETOL ) 200 MG tablet Take 200 mg by mouth 3 (three) times daily.    [provider]  Cholecalciferol  100 MCG (4000 UT) TABS Take 1,000 Units by mouth.    [provider]  esomeprazole (NEXIUM) 40 MG capsule Take 40 mg by mouth daily at 12 noon.    [provider]  famotidine  (PEPCID ) 20 MG tablet Take 20 mg by mouth 2 (two) times daily. 04/23/20   [provider]  hydroxypropyl methylcellulose / hypromellose (ISOPTO TEARS / GONIOVISC) 2.5 % ophthalmic solution 1 drop as needed for dry eyes.    [provider]  iron  polysaccharides (NIFEREX) 150 MG capsule Take 1 capsule (150 mg total) by mouth 2 (two) times daily for 30 days, THEN 1 capsule (150 mg total) daily. 04/21/22 09/23/23  Von Bellis, MD  levothyroxine  (SYNTHROID ) 100 MCG tablet Take 100 mcg by mouth every morning. 03/04/21   [provider]  magnesium  oxide (MAG-OX) 400 MG tablet Take 1 tablet by mouth 3 (  three) times daily. 01/13/22   [provider]  naproxen (NAPROSYN) 375 MG tablet Take 375 mg by mouth 2 (two) times daily with a meal.    [provider]  OLANZapine  (ZYPREXA ) 10 MG tablet Take 1 tablet (10 mg total) by mouth at bedtime. 06/10/23 09/23/23  Vickey Mettle, MD  OLANZapine  (ZYPREXA ) 15 MG tablet Take 1 tablet (15 mg total) by mouth at bedtime. 09/03/23 12/02/23  Vickey Mettle, MD  OLANZapine  (ZYPREXA ) 5 MG tablet Take 1 tablet (5 mg total) by mouth daily. 05/12/23 09/23/23  Vickey Mettle, MD  PARoxetine  (PAXIL ) 20 MG tablet TAKE 1 TABLET BY MOUTH EVERY DAY 06/05/23   Okey Barnie SAUNDERS, MD  potassium chloride  20 MEQ/15ML (10%) SOLN Take 10 mEq by mouth daily.    [provider]  vitamin E 400 UNIT capsule Take 400 Units by mouth daily.    [provider]     Physical Exam: Vitals:   09/25/23 0856 09/25/23 0857 09/25/23 0930 09/25/23 1132  BP: (!) 140/90  (!) 139/97 118/62  Pulse:   90 84  Resp:   19 14  Temp:  98 F (36.7 C)  98 F (36.7 C)  TempSrc:  Oral    SpO2:   95% 95%  Weight:      Height:        Constitutional: NAD, calm, comfortable Vitals:   09/25/23 0856 09/25/23 0857 09/25/23 0930 09/25/23 1132  BP: (!) 140/90  (!) 139/97 118/62  Pulse:   90 84  Resp:   19 14  Temp:  98 F (36.7 C)  98 F (36.7 C)  TempSrc:  Oral    SpO2:   95% 95%  Weight:      Height:       Eyes: PERRL, lids and conjunctivae normal ENMT: Mucous membranes are moist. Posterior pharynx clear of any exudate or lesions.Normal dentition.  Neck: normal, supple, no masses, no thyromegaly Respiratory: diffuse rhonchi/crackles Normal respiratory effort. No accessory muscle use.  Cardiovascular: Regular rate and rhythm, no murmurs / rubs / gallops. +extremity edema. Warm extremities  Abdomen: obese ,no tenderness, no masses palpated. No hepatosplenomegaly. Bowel sounds positive.  Musculoskeletal: no clubbing / cyanosis. No joint deformity upper and lower extremities. +lue contractures. Normal muscle tone.  Skin: no rashes, lesions, ulcers. No induration Neurologic: CN grossly intact. Sensation intact,. [id number] Psychiatric: Normal judgment and insight. Alert and oriented x 2. Normal mood.    Labs on Admission: I have personally reviewed following labs and imaging studies  CBC: Recent Labs  Lab 09/22/23 1048 09/25/23 0823  WBC 8.4 5.9  NEUTROABS 5.8 4.0  HGB 12.6 11.9*  HCT 39.7 37.9  MCV 98.8 99.0  PLT 267 276   Basic Metabolic Panel: Recent Labs  Lab 09/22/23 1229 09/25/23 0823  NA  --  134*  K  --  5.4*  CL  --  99  CO2  --  28  GLUCOSE  --  103*  BUN  --  26*  CREATININE 0.60 0.49  CALCIUM   --  8.6*   GFR: Estimated Creatinine Clearance: 70.2 mL/min (by C-G formula based on SCr of 0.49 mg/dL). Liver Function Tests: Recent  Labs  Lab 09/25/23 0823  AST 23  ALT 33  ALKPHOS 85  BILITOT 0.8  PROT 6.2*  ALBUMIN 3.2*   No results for input(s): LIPASE, AMYLASE in the last 168 hours. No results for input(s): AMMONIA in the last 168 hours. Coagulation Profile: No results for input(s): INR, PROTIME  in the last 168 hours. Cardiac Enzymes: No results for input(s): CKTOTAL, CKMB, CKMBINDEX, TROPONINI in the last 168 hours. BNP (last 3 results) No results for input(s): PROBNP in the last 8760 hours. HbA1C: No results for input(s): HGBA1C in the last 72 hours. CBG: No results for input(s): GLUCAP in the last 168 hours. Lipid Profile: No results for input(s): CHOL, HDL, LDLCALC, TRIG, CHOLHDL, LDLDIRECT in the last 72 hours. Thyroid  Function Tests: No results for input(s): TSH, T4TOTAL, FREET4, T3FREE, THYROIDAB in the last 72 hours. Anemia Panel: No results for input(s): VITAMINB12, FOLATE, FERRITIN, TIBC, IRON , RETICCTPCT in the last 72 hours. Urine analysis:    Component Value Date/Time   COLORURINE AMBER (A) 09/25/2023 0955   APPEARANCEUR HAZY (A) 09/25/2023 0955   LABSPEC 1.029 09/25/2023 0955   PHURINE 5.0 09/25/2023 0955   GLUCOSEU NEGATIVE 09/25/2023 0955   HGBUR NEGATIVE 09/25/2023 0955   BILIRUBINUR NEGATIVE 09/25/2023 0955   KETONESUR NEGATIVE 09/25/2023 0955   PROTEINUR 100 (A) 09/25/2023 0955   NITRITE NEGATIVE 09/25/2023 0955   LEUKOCYTESUR MODERATE (A) 09/25/2023 0955    Radiological Exams on Admission: CT Angio Chest PE W/Cm &/Or Wo Cm Result Date: 09/25/2023 CLINICAL DATA:  Pulmonary embolism (PE) suspected, high prob, dyspnea EXAM: CT ANGIOGRAPHY CHEST WITH CONTRAST TECHNIQUE: Multidetector CT imaging of the chest was performed using the standard protocol during bolus administration of intravenous contrast. Multiplanar CT image reconstructions and MIPs were obtained to evaluate the vascular anatomy. RADIATION DOSE REDUCTION: This  exam was performed according to the departmental dose-optimization program which includes automated exposure control, adjustment of the mA and/or kV according to patient size and/or use of iterative reconstruction technique. CONTRAST:  75mL OMNIPAQUE  IOHEXOL  350 MG/ML SOLN COMPARISON:  Chest radiograph from earlier today. 10/18/2022 chest CT angiogram. FINDINGS: Cardiovascular: Streaky indistinct areas of low-attenuation throughout the main, right and left pulmonary arteries, favor mixing artifact. No convincing filling defects in the pulmonary arteries to suggest pulmonary embolism. Atherosclerotic nonaneurysmal thoracic aorta. Normal caliber pulmonary arteries. Top-normal heart size. No significant pericardial fluid/thickening. Mediastinum/Nodes: No significant thyroid  nodules. Unremarkable esophagus. No pathologically enlarged axillary, mediastinal or hilar lymph nodes. Lungs/Pleura: No pneumothorax. Small to moderate right and small left layering pleural effusions. Moderate dependent bibasilar atelectasis. Mosaic attenuation throughout both lungs. Indistinct tiny 0.3 cm posterior right upper lobe solid pulmonary nodule on series 5/image 69, stable. No new significant pulmonary nodules in the aerated portions of the lungs. No acute consolidative airspace disease. Upper abdomen: No acute abnormality. Musculoskeletal: No aggressive appearing focal osseous lesions. Marked thoracic spondylosis. Review of the MIP images confirms the above findings. IMPRESSION: 1. No convincing evidence of pulmonary embolism. Streaky indistinct areas of low-attenuation throughout the main, right and left pulmonary arteries, favor mixing artifact. 2. Small to moderate right and small left layering pleural effusions. Moderate dependent bibasilar atelectasis. 3. Mosaic attenuation throughout both lungs, as can be seen with air trapping from small airways disease versus mosaic perfusion from pulmonary vascular disease. 4.  Aortic  Atherosclerosis (ICD10-I70.0). Electronically Signed   By: Selinda DELENA Blue M.D.   On: 09/25/2023 10:54   DG Chest 1 View Result Date: 09/25/2023 CLINICAL DATA:  Shortness of breath and edema. EXAM: CHEST  1 VIEW COMPARISON:  AP Lat chest 02/06/2023 FINDINGS: Low inspiration on exam compared to the prior study. There are small pleural effusions. There is patchy consolidation or atelectasis in both lower lung fields. Superimposed chronic elevation right hemidiaphragm. There is a large hiatal hernia with stable mediastinum. Mild cardiomegaly without vascular  prominence. There is aortic atherosclerosis. Degenerative change thoracic spine. No new osseous findings. IMPRESSION: 1. Low inspiration. 2. Small pleural effusions. 3. Patchy consolidation or atelectasis in both lower lung fields. 4. Large hiatal hernia. 5. Aortic atherosclerosis. Electronically Signed   By: Francis Quam M.D.   On: 09/25/2023 07:53    EKG: Independently reviewed.   Assessment/Plan  CAP with hypoxemia  -patient with increase wbc and  Opacities on cxr unable to r/o infection  -ctx/ azithromycin , de-escalate as able  -pulmonary toilet  -urine ag, sputum, f/u on culture data  -on O2 wean as able  -New onset CHF  -noted pleural effusion b/l R>L   -lasix  40 mg iv bid  -cycle ce, check tsh, echo in am  -ekg without hyperacute st -twave changes  -cardiology consult in am    PVD Ulcer right heel and Mid foot  -wound care to see  -followed at [facility]  IDA -stable h/h -followed by hematology   GERD Benign esophageal stenosis Gastritis Duodenal diverticulum -followed by gi  -continue with ppi    DMII Iss/fs    Depression/Anxiety  Schizophreniform d/o  -followed by psychiatry  -resume psych medications -xanax ,olanzapine ( 15mg  qhs),paxil    Glaucoma -resume eye drops as able    HLD -continue tx with atorvastatin     Hypothyrodism  -resume synthroid     Seizures -continue tegretol   Hx cerebral  palsy -chronic debility wheel chair bound   DVT prophylaxis: heparin  Code Status: full/ as discussed per patient wishes in event of cardiac arrest  Family Communication: Disposition Plan: patient  expected to be admitted greater than 2 midnights  Consults called: Pulmonary  Admission status: med tele  Camila DELENA Ned MD [facility]  If 7PM-7AM, please contact night-coverage [email or web address] Password [email or web address]  09/25/2023, 1:18 PM

## 2023-09-25 NOTE — ED Triage Notes (Signed)
 Arrives via EMS for SOB from home Ongoing when she woke up this AM EMS assessment: Edema and gastrointestinal edmema, brownish sputum  EMS interventions: 2L O2 [place] BP 140/90, HR 75, 90% RA, no temp or CBG  MedHx: Fluid retention, GERD, GI issues  Med list is long

## 2023-09-25 NOTE — ED Notes (Signed)
 Pt repeatedly asking all staff members is someone going to come feed me?. Pt was fed by this tech at 16:30 and pt ate all her food except rice, which the pt says gives me infection. Pt was reminded she has eaten her food but but she does not seem to remember she has.

## 2023-09-25 NOTE — ED Notes (Signed)
 Lab at bedside

## 2023-09-25 NOTE — ED Notes (Signed)
 Lab to come and get Pt labs.

## 2023-09-25 NOTE — ED Notes (Signed)
 Pharmacy messaged regarding pt missing med

## 2023-09-25 NOTE — ED Notes (Signed)
 Fall precautions in place for Pt. This RN placed fall band, fall grip socks, bed alarm and fall sign.

## 2023-09-25 NOTE — ED Notes (Signed)
 Pt assisted with feeding by this tech.

## 2023-09-25 NOTE — ED Provider Notes (Signed)
 Michele James Provider Note    Event Date/Time   First MD Initiated Contact with Patient 09/25/23 [phone]     (approximate)   History   Shortness of Breath   HPI  Michele James is a 68 y.o. female with history of cerebral palsy, wheelchair-bound, history of GERD, hypothyroidism, seizures, diabetes, presenting with bilateral lower extremity swelling as well as shortness of breath.  Per EMS she does not have history of CHF but does have history of fluid retention.  They found her satting in the 90s but without a good waveform so they put her on 2 L nasal cannula.  She was complaining as well of the productive cough that was brown.  No chest pain or shortness of breath.  No diarrhea, she does note some dysuria but no abdominal pain.  No recent trauma or falls.  Independent history obtained from EMS as above.  On independent chart review, she is not on any Lasix , had an echo done in 2024 that shows grade 1 diastolic dysfunction, EF is 60 to 65%.     Physical Exam   Triage Vital Signs: ED Triage Vitals  Encounter Vitals Group     BP --      Girls Systolic BP Percentile --      Girls Diastolic BP Percentile --      Boys Systolic BP Percentile --      Boys Diastolic BP Percentile --      Pulse --      Resp --      Temp --      Temp src --      SpO2 09/25/23 0659 90 %     Weight 09/25/23 0703 181 lb 3.2 oz (82.2 kg)     Height 09/25/23 0703 5' 3 (1.6 m)     Head Circumference --      Peak Flow --      Pain Score 09/25/23 0703 5     Pain Loc --      Pain Education --      Exclude from Growth Chart --     Most recent vital signs: Vitals:   09/25/23 0856 09/25/23 0857  BP: (!) 140/90   Pulse:    Resp:    Temp:  98 F (36.7 C)  SpO2:       General: Awake, no distress.  CV:  Good peripheral perfusion.  Resp:  Normal effort.  Hypoxic to the mid 80s, clear Abd:  No distention.  Soft nontender Other:  Bilateral lower extremity edema that appears  symmetrical, she does have a heel ulcer that is superficial, there is no surrounding erythema, no purulent drainage   ED Results / Procedures / Treatments   Labs (all labs ordered are listed, but only abnormal results are displayed) Labs Reviewed  COMPREHENSIVE METABOLIC PANEL WITH GFR - Abnormal; Notable for the following components:      Result Value   Sodium 134 (*)    Potassium 5.4 (*)    Glucose, Bld 103 (*)    BUN 26 (*)    Calcium  8.6 (*)    Total Protein 6.2 (*)    Albumin 3.2 (*)    All other components within normal limits  BRAIN NATRIURETIC PEPTIDE - Abnormal; Notable for the following components:   B Natriuretic Peptide 1,406.2 (*)    All other components within normal limits  CBC WITH DIFFERENTIAL/PLATELET - Abnormal; Notable for the following components:   RBC 3.83 (*)  Hemoglobin 11.9 (*)    All other components within normal limits  URINALYSIS, W/ REFLEX TO CULTURE (INFECTION SUSPECTED) - Abnormal; Notable for the following components:   Color, Urine AMBER (*)    APPearance HAZY (*)    Protein, ur 100 (*)    Leukocytes,Ua MODERATE (*)    Bacteria, UA MANY (*)    All other components within normal limits  RESP PANEL BY RT-PCR (RSV, FLU A&B, COVID)  [id number]  URINE CULTURE  TROPONIN I (HIGH SENSITIVITY)  TROPONIN I (HIGH SENSITIVITY)     EKG  EKG shows, sinus rhythm, rate 89, normal QRS, normal QTc, no ischemic ST elevation, T wave flattening in 3, T wave inversion to septal leads, T wave inversion to V2 is new compared to prior   RADIOLOGY On my independent interpretation, chest x-ray showed opacities bilaterally   PROCEDURES:  Critical Care performed: Yes, see critical care procedure note(s)  .Critical Care  Performed by: Waymond Lorelle Cummins, MD Authorized by: Waymond Lorelle Cummins, MD   Critical care provider statement:    Critical care time (minutes):  40   Critical care was necessary to treat or prevent imminent or life-threatening deterioration of the  following conditions:  Respiratory failure   Critical care was time spent personally by me on the following activities:  Development of treatment plan with patient or surrogate, discussions with consultants, evaluation of patient's response to treatment, examination of patient, ordering and review of laboratory studies, ordering and review of radiographic studies, ordering and performing treatments and interventions, pulse oximetry, re-evaluation of patient's condition and review of old charts    MEDICATIONS ORDERED IN ED: Medications  cefTRIAXone  (ROCEPHIN ) 1 g in sodium chloride  0.9 % 100 mL IVPB (0 g Intravenous Stopped 09/25/23 0943)  azithromycin  (ZITHROMAX ) 500 mg in sodium chloride  0.9 % 250 mL IVPB (0 mg Intravenous Stopped 09/25/23 1015)  furosemide  (LASIX ) injection 20 mg (20 mg Intravenous Given 09/25/23 0946)  iohexol  (OMNIPAQUE ) 350 MG/ML injection 75 mL (75 mLs Intravenous Contrast Given 09/25/23 1019)     IMPRESSION / MDM / ASSESSMENT AND PLAN / ED COURSE  I reviewed the triage vital signs and the nursing notes.                              Differential diagnosis includes, but is not limited to, CHF exacerbation, volume overload, pneumonia, viral illness, PE, atypical ACS, will get labs, EKG, troponin, chest x-ray, CT PE study.  She will need to be admitted for further management  Patient's presentation is most consistent with acute presentation with potential threat to life or bodily function.  Independent interpretation of labs and imaging below.  Given the CHF exacerbation, pneumonia, UTI, patient will need to be admitted for further management.  Consult to hospitalist was agreeable with the plan for admission and will evaluate the patient.  She is admitted.  The patient is on the cardiac monitor to evaluate for evidence of arrhythmia and/or significant heart rate changes.   Clinical Course as of 09/25/23 1056  Fri Sep 25, 2023  0824 DG Chest 1 View IMPRESSION: 1. Low  inspiration. 2. Small pleural effusions. 3. Patchy consolidation or atelectasis in both lower lung fields. 4. Large hiatal hernia. 5. Aortic atherosclerosis.   [TT]  [id number] Chest x-ray shows patchy consolidations in the lower lung fields, given her productive cough, we will start her on antibiotics, IV ceftriaxone  which she has tolerated in the past as  well as IV azithromycin . [TT]  [id number] Independent review of labs, mild hyperkalemia, creatinine is normal, LFTs are normal, BNP is elevated, troponin is normal, no leukocytosis, respiratory viral panel is negative.  Will order some Lasix  for volume overload as well as hyper-K. [TT]  1023 Urinalysis, w/ Reflex to Culture (Infection Suspected) -Urine, Clean Catch(!) Consistent with UTI, she is already getting ceftriaxone . [TT]    Clinical Course User Index [TT] Waymond, Lorelle Cummins, MD     FINAL CLINICAL IMPRESSION(S) / ED DIAGNOSES   Final diagnoses:  Hypoxia  Shortness of breath  Leg swelling  Acute on chronic congestive heart failure, unspecified heart failure type (HCC)  Pneumonia of both lungs due to infectious organism, unspecified part of lung  Urinary tract infection without hematuria, site unspecified     Rx / DC Orders   ED Discharge Orders     None        Note:  This document was prepared using Dragon voice recognition software and may include unintentional dictation errors.    Waymond Lorelle Cummins, MD 09/25/23 [phone]

## 2023-09-25 NOTE — ED Notes (Signed)
 Pt resting at this time. Stretcher in lowest position with wheels locked. Pillow provided and HOB adjusted per request. Side rails up x 2. Call light within reach.

## 2023-09-25 NOTE — ED Notes (Signed)
 Humidity applied top wall O2 supply and at 4L [place]. O2 saturations 95-96.

## 2023-09-26 DIAGNOSIS — E039 Hypothyroidism, unspecified: Secondary | ICD-10-CM

## 2023-09-26 DIAGNOSIS — D509 Iron deficiency anemia, unspecified: Secondary | ICD-10-CM

## 2023-09-26 DIAGNOSIS — Z8669 Personal history of other diseases of the nervous system and sense organs: Secondary | ICD-10-CM

## 2023-09-26 DIAGNOSIS — J189 Pneumonia, unspecified organism: Secondary | ICD-10-CM

## 2023-09-26 DIAGNOSIS — K219 Gastro-esophageal reflux disease without esophagitis: Secondary | ICD-10-CM

## 2023-09-26 DIAGNOSIS — N390 Urinary tract infection, site not specified: Secondary | ICD-10-CM

## 2023-09-26 DIAGNOSIS — R569 Unspecified convulsions: Secondary | ICD-10-CM

## 2023-09-26 DIAGNOSIS — I509 Heart failure, unspecified: Secondary | ICD-10-CM

## 2023-09-26 DIAGNOSIS — J9601 Acute respiratory failure with hypoxia: Secondary | ICD-10-CM | POA: Diagnosis not present

## 2023-09-26 DIAGNOSIS — I739 Peripheral vascular disease, unspecified: Secondary | ICD-10-CM

## 2023-09-26 DIAGNOSIS — F419 Anxiety disorder, unspecified: Secondary | ICD-10-CM

## 2023-09-26 DIAGNOSIS — E119 Type 2 diabetes mellitus without complications: Secondary | ICD-10-CM

## 2023-09-26 DIAGNOSIS — F32A Depression, unspecified: Secondary | ICD-10-CM

## 2023-09-26 LAB — COMPREHENSIVE METABOLIC PANEL WITH GFR
ALT: 26 U/L (ref 0–44)
AST: 23 U/L (ref 15–41)
Albumin: 2.9 g/dL — ABNORMAL LOW (ref 3.5–5.0)
Alkaline Phosphatase: 80 U/L (ref 38–126)
Anion gap: 10 (ref 5–15)
BUN: 25 mg/dL — ABNORMAL HIGH (ref 8–23)
CO2: 28 mmol/L (ref 22–32)
Calcium: 8.3 mg/dL — ABNORMAL LOW (ref 8.9–10.3)
Chloride: 101 mmol/L (ref 98–111)
Creatinine, Ser: 0.65 mg/dL (ref 0.44–1.00)
GFR, Estimated: >60 mL/min (ref >60)
Glucose, Bld: 93 mg/dL (ref 70–99)
Potassium: 4.4 mmol/L (ref 3.5–5.1)
Sodium: 139 mmol/L (ref 135–145)
Total Bilirubin: 0.9 mg/dL (ref 0.0–1.2)
Total Protein: 5.8 g/dL — ABNORMAL LOW (ref 6.5–8.1)

## 2023-09-26 LAB — GLUCOSE, CAPILLARY
Glucose-Capillary: 143 mg/dL — ABNORMAL HIGH (ref 70–99)
Glucose-Capillary: 143 mg/dL — ABNORMAL HIGH (ref 70–99)
Glucose-Capillary: 99 mg/dL (ref 70–99)

## 2023-09-26 LAB — CBC
HCT: 36.3 % (ref 36.0–46.0)
Hemoglobin: 11.7 g/dL — ABNORMAL LOW (ref 12.0–15.0)
MCH: 32.1 pg (ref 26.0–34.0)
MCHC: 32.2 g/dL (ref 30.0–36.0)
MCV: 99.5 fL (ref 80.0–100.0)
Platelets: 273 10*3/uL (ref 150–400)
RBC: 3.65 MIL/uL — ABNORMAL LOW (ref 3.87–5.11)
RDW: 14.4 % (ref 11.5–15.5)
WBC: 6 10*3/uL (ref 4.0–10.5)
nRBC: 0 % (ref 0.0–0.2)

## 2023-09-26 LAB — HEMOGLOBIN A1C
Hgb A1c MFr Bld: 6.5 % — ABNORMAL HIGH (ref 4.8–5.6)
Mean Plasma Glucose: 139.85 mg/dL

## 2023-09-26 LAB — CBG MONITORING, ED
Glucose-Capillary: 95 mg/dL (ref 70–99)
Glucose-Capillary: 106 mg/dL — ABNORMAL HIGH (ref 70–99)

## 2023-09-26 LAB — LEGIONELLA PNEUMOPHILA SEROGP 1 UR AG: L. pneumophila Serogp 1 Ur Ag: NEGATIVE

## 2023-09-26 MED ORDER — FUROSEMIDE 10 MG/ML IJ SOLN: 40.0000 mg | Freq: Two times a day (BID) | INTRAMUSCULAR | Status: DC

## 2023-09-26 MED ORDER — DOXYCYCLINE HYCLATE 100 MG PO TABS
100.0000 mg | ORAL_TABLET | Freq: Two times a day (BID) | ORAL | Status: DC
  Administered 2023-09-26 (×2): 100 mg via ORAL
  Filled 2023-09-26 (×2): qty 1

## 2023-09-26 MED ORDER — FUROSEMIDE 10 MG/ML IJ SOLN
INTRAMUSCULAR | Status: AC
  Administered 2023-09-27: 40 mg via INTRAVENOUS
  Filled 2023-09-26: qty 4

## 2023-09-26 MED ORDER — FUROSEMIDE 10 MG/ML IJ SOLN
40.0000 mg | Freq: Once | INTRAMUSCULAR | Status: AC
  Administered 2023-09-26: 40 mg via INTRAVENOUS

## 2023-09-26 NOTE — ED Notes (Signed)
 Pt was cleaned by NT and given breakfast tray

## 2023-09-26 NOTE — ED Notes (Signed)
 Fall precautions in place for Pt. This RN placed fall band, fall grip socks, bed alarm and fall sign.

## 2023-09-26 NOTE — Assessment & Plan Note (Signed)
 Imaging with some concern of pneumonia versus pulmonary vascular congestion.No leukocytosis and procalcitonin is negative so pneumonia ruled out.

## 2023-09-26 NOTE — Assessment & Plan Note (Signed)
 Prior echo done in July 2024 with normal EF and grade 1 diastolic dysfunction.  Elevated BNP at 1406 and clinically volume overload. Repeat echocardiogram ordered-pending -Increasing Lasix  to 40 mg twice daily -Strict intake and output -Daily weight and BMP

## 2023-09-26 NOTE — Assessment & Plan Note (Signed)
 Follow-up by outpatient psychiatry -Continue home Paxil , olanzapine  and Xanax 

## 2023-09-26 NOTE — ED Notes (Signed)
 This tech and [employer] changed pt brief and sheet with new linen and ensured patient was dry.

## 2023-09-26 NOTE — Assessment & Plan Note (Signed)
 Patient has ulcer in right heel and midfoot. No acute concern -Wound care was consulted -Continue with outpatient follow-up at [facility]

## 2023-09-26 NOTE — Assessment & Plan Note (Signed)
-  Continue with home Synthroid 

## 2023-09-26 NOTE — Assessment & Plan Note (Signed)
 Urine culture with Klebsiella pneumonia and Enterococcus faecalis-pending susceptibility -Continue ceftriaxone  and doxycycline

## 2023-09-26 NOTE — Assessment & Plan Note (Signed)
-   Continue with home Tegretol

## 2023-09-26 NOTE — Progress Notes (Signed)
 Progress Note   Patient: Michele James FMW:[id number] DOB: 1955/11/22 DOA: 09/25/2023     1 DOS: the patient was seen and examined on 09/26/2023   Brief hospital course: Taken from H&P.  Michele James is a 68 y.o. female with medical history significant of  IDA, GERD, DMII, Depression, Glaucoma, HLD, hypothyrodism , Seizures, cerebral palsy,PVD,schizophreniform disorder who presents to ED with sob/productive cough and b/l lower extremity swelling.   Patient was initially hypoxic in 80s and was placed on supplemental oxygen. Labs pertinent for BNP of 04/03/2004, potassium 5.4, respiratory viral panel negative.  UA with many bacteria and leukocytes.  Chest x-ray with small pleural effusions, patchy consolidation or atelectasis in both lower lung fields and a large hiatal hernia. CTA was negative for PE.  Small to moderate right and small left layering pleural effusion and bibasilar atelectasis.  Mosaic attenuation throughout both lungs, which could be either due to air trapping from small airway disease versus pulmonary vascular disease.  6/28: Afebrile with stable vitals on 5 L of oxygen.  Preliminary blood cultures negative.  Labs stable, urine cultures pending.  Procalcitonin negative. Echocardiogram pending.  Assessment and Plan: * Acute hypoxic respiratory failure (HCC) Likely due to pulmonary edema and volume overload. No home oxygen use, currently on 4 L of oxygen. - Continue supplemental oxygen-wean as tolerated  CHF exacerbation (HCC) Prior echo done in July 2024 with normal EF and grade 1 diastolic dysfunction.  Elevated BNP at 1406 and clinically volume overload. Repeat echocardiogram ordered-pending -Increasing Lasix  to 40 mg twice daily -Strict intake and output -Daily weight and BMP  UTI (urinary tract infection) Urine culture with Klebsiella pneumonia and Enterococcus faecalis-pending susceptibility -Continue ceftriaxone  and doxycycline  CAP (community acquired  pneumonia) Imaging with some concern of pneumonia versus pulmonary vascular congestion.No leukocytosis and procalcitonin is negative so pneumonia ruled out.  Diabetes mellitus without complication (HCC) Well-controlled with CBG within goal.  A1c of 6.5 -Continue with sensitive SSI  Anemia History of iron  deficiency anemia. Hemoglobin currently stable -Continue with home iron  supplement  Hypothyroidism - Continue with home Synthroid   PVD (peripheral vascular disease) (HCC) Patient has ulcer in right heel and midfoot. 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 Followed up by outpatient GI -Continue with PPI  Anxiety and depression Follow-up by outpatient psychiatry -Continue home Paxil , olanzapine  and Xanax   History of cerebral palsy - Wheelchair-bound at baseline with left upper extremity contractures   Subjective: Patient was seen and examined today.  Denies any shortness of breath or chest pain.  No baseline oxygen use.  Worsening lower extremity edema for some time.  Physical Exam: Vitals:   09/26/23 1300 09/26/23 1349 09/26/23 1434 09/26/23 1559  BP: (!) 111/59 (!) 111/59 107/60 105/62  Pulse: 92 92 90 97  Resp: (!) 26 (!) 21 20   Temp:  98.3 F (36.8 C) 98.9 F (37.2 C) 99.3 F (37.4 C)  TempSrc:      SpO2:  96% 96% 96%  Weight:      Height:       General.  Obese lady, in no acute distress. Pulmonary.  Lungs clear bilaterally, normal respiratory effort. CV.  Regular rate and rhythm, no JVD, rub or murmur. Abdomen.  Soft, nontender, nondistended, BS positive. CNS.  Alert and oriented .  No focal neurologic deficit. Extremities.  3+ LE  edema bilaterally, pulses intact and symmetrical.  Left upper body contractures  Data Reviewed: Prior data reviewed  Family  Communication: Talked with father on phone  Disposition: Status is: Inpatient Remains inpatient appropriate because: Severity of illness  Planned Discharge Destination: Home  DVT prophylaxis.  Subcu heparin  Time spent: 50 minutes  This record has been created using Conservation officer, historic buildings. Errors have been sought and corrected,but may not always be located. Such creation errors do not reflect on the standard of care.   Author: Amaryllis Dare, MD 09/26/2023 4:10 PM  For on call review [email or web address].

## 2023-09-26 NOTE — Hospital Course (Addendum)
 Taken from H&P.  Michele James is a 68 y.o. female with medical history significant of  IDA, GERD, DMII, Depression, Glaucoma, HLD, hypothyrodism , Seizures, cerebral palsy,PVD,schizophreniform disorder who presents to ED with sob/productive cough and b/l lower extremity swelling.   Patient was initially hypoxic in 80s and was placed on supplemental oxygen. Labs pertinent for BNP of 04/03/2004, potassium 5.4, respiratory viral panel negative.  UA with many bacteria and leukocytes.  Chest x-ray with small pleural effusions, patchy consolidation or atelectasis in both lower lung fields and a large hiatal hernia. CTA was negative for PE.  Small to moderate right and small left layering pleural effusion and bibasilar atelectasis.  Mosaic attenuation throughout both lungs, which could be either due to air trapping from small airway disease versus pulmonary vascular disease.  6/28: Afebrile with stable vitals on 5 L of oxygen.  Preliminary blood cultures negative.  Labs stable, urine cultures pending.  Procalcitonin negative. Echocardiogram pending.  6/29: Around 2 AM rapid response was called due to respiratory distress and agonal breathing.  Patient was unresponsive with increased work of breathing so transferred to ICU and emergently intubated for airway protection.  ICU took over the care now.

## 2023-09-26 NOTE — Assessment & Plan Note (Signed)
 Likely due to pulmonary edema and volume overload. No home oxygen use, currently on 4 L of oxygen. - Continue supplemental oxygen-wean as tolerated

## 2023-09-26 NOTE — Assessment & Plan Note (Signed)
 Well-controlled with CBG within goal.  A1c of 6.5 -Continue with sensitive SSI

## 2023-09-26 NOTE — Assessment & Plan Note (Signed)
 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 Followed up by outpatient GI -Continue with PPI

## 2023-09-26 NOTE — Assessment & Plan Note (Signed)
 History of iron  deficiency anemia. Hemoglobin currently stable -Continue with home iron  supplement

## 2023-09-26 NOTE — Assessment & Plan Note (Signed)
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

## 2023-09-27 ENCOUNTER — Inpatient Hospital Stay

## 2023-09-27 ENCOUNTER — Inpatient Hospital Stay (HOSPITAL_COMMUNITY)
Admit: 2023-09-27 | Discharge: 2023-09-27 | Disposition: A | Discharge status: Home / Self Care | Attending: Internal Medicine | Admitting: Internal Medicine

## 2023-09-27 ENCOUNTER — Other Ambulatory Visit: Payer: Self-pay

## 2023-09-27 DIAGNOSIS — J9602 Acute respiratory failure with hypercapnia: Secondary | ICD-10-CM | POA: Diagnosis not present

## 2023-09-27 DIAGNOSIS — J189 Pneumonia, unspecified organism: Secondary | ICD-10-CM | POA: Diagnosis not present

## 2023-09-27 DIAGNOSIS — I5031 Acute diastolic (congestive) heart failure: Secondary | ICD-10-CM

## 2023-09-27 DIAGNOSIS — A419 Sepsis, unspecified organism: Secondary | ICD-10-CM

## 2023-09-27 DIAGNOSIS — I509 Heart failure, unspecified: Secondary | ICD-10-CM | POA: Diagnosis not present

## 2023-09-27 DIAGNOSIS — J9601 Acute respiratory failure with hypoxia: Secondary | ICD-10-CM

## 2023-09-27 DIAGNOSIS — G9341 Metabolic encephalopathy: Secondary | ICD-10-CM

## 2023-09-27 LAB — ECHOCARDIOGRAM COMPLETE
AR max vel: 2.58 cm2
AV Area VTI: 2.43 cm2
AV Area mean vel: 2.21 cm2
AV Mean grad: 2 mmHg
AV Peak grad: 3.3 mmHg
Ao pk vel: 0.91 m/s
Area-P 1/2: 2.87 cm2
Calc EF: 56.9 %
Height: 63 in
MV VTI: 2.02 cm2
S' Lateral: 2.9 cm
Single Plane A2C EF: 48.1 %
Single Plane A4C EF: 67 %
Weight: 2899.2 [oz_av]

## 2023-09-27 LAB — RESPIRATORY PANEL BY PCR

## 2023-09-27 LAB — BLOOD GAS, ARTERIAL
Acid-Base Excess: 10 mmol/L — ABNORMAL HIGH (ref 0.0–2.0)
Bicarbonate: 35 mmol/L — ABNORMAL HIGH (ref 20.0–28.0)
FIO2: 100 %
MECHVT: 450 mL
Mechanical Rate: 18
O2 Saturation: 99.1 %
PEEP: 5 cmH2O
Patient temperature: 37
pCO2 arterial: 47 mmHg (ref 32–48)
pH, Arterial: 7.48 — ABNORMAL HIGH (ref 7.35–7.45)
pO2, Arterial: 98 mmHg (ref 83–108)

## 2023-09-27 LAB — URINE CULTURE: Culture: 80000 — AB

## 2023-09-27 LAB — CBC
HCT: 35.4 % — ABNORMAL LOW (ref 36.0–46.0)
Hemoglobin: 11.6 g/dL — ABNORMAL LOW (ref 12.0–15.0)
MCH: 31.9 pg (ref 26.0–34.0)
MCHC: 32.8 g/dL (ref 30.0–36.0)
MCV: 97.3 fL (ref 80.0–100.0)
Platelets: 235 10*3/uL (ref 150–400)
RBC: 3.64 MIL/uL — ABNORMAL LOW (ref 3.87–5.11)
RDW: 14 % (ref 11.5–15.5)
WBC: 9.4 10*3/uL (ref 4.0–10.5)
nRBC: 0 % (ref 0.0–0.2)

## 2023-09-27 LAB — GLUCOSE, CAPILLARY
Glucose-Capillary: 128 mg/dL — ABNORMAL HIGH (ref 70–99)
Glucose-Capillary: 151 mg/dL — ABNORMAL HIGH (ref 70–99)
Glucose-Capillary: 63 mg/dL — ABNORMAL LOW (ref 70–99)
Glucose-Capillary: 81 mg/dL (ref 70–99)
Glucose-Capillary: 93 mg/dL (ref 70–99)

## 2023-09-27 LAB — COMPREHENSIVE METABOLIC PANEL WITH GFR
ALT: 27 U/L (ref 0–44)
AST: 24 U/L (ref 15–41)
Albumin: 3.1 g/dL — ABNORMAL LOW (ref 3.5–5.0)
Alkaline Phosphatase: 79 U/L (ref 38–126)
Anion gap: 11 (ref 5–15)
BUN: 24 mg/dL — ABNORMAL HIGH (ref 8–23)
CO2: 30 mmol/L (ref 22–32)
Calcium: 8.2 mg/dL — ABNORMAL LOW (ref 8.9–10.3)
Chloride: 96 mmol/L — ABNORMAL LOW (ref 98–111)
Creatinine, Ser: 0.53 mg/dL (ref 0.44–1.00)
GFR, Estimated: >60 mL/min (ref >60)
Glucose, Bld: 143 mg/dL — ABNORMAL HIGH (ref 70–99)
Potassium: 3.5 mmol/L (ref 3.5–5.1)
Sodium: 137 mmol/L (ref 135–145)
Total Bilirubin: 0.8 mg/dL (ref 0.0–1.2)
Total Protein: 5.9 g/dL — ABNORMAL LOW (ref 6.5–8.1)

## 2023-09-27 LAB — MRSA NEXT GEN BY PCR, NASAL: MRSA by PCR Next Gen: NOT DETECTED

## 2023-09-27 LAB — BRAIN NATRIURETIC PEPTIDE: B Natriuretic Peptide: 962 pg/mL — ABNORMAL HIGH (ref 0.0–100.0)

## 2023-09-27 LAB — TROPONIN I (HIGH SENSITIVITY)
Troponin I (High Sensitivity): 27 ng/L — ABNORMAL HIGH (ref <18)
Troponin I (High Sensitivity): 34 ng/L — ABNORMAL HIGH (ref <18)

## 2023-09-27 LAB — LACTIC ACID, PLASMA: Lactic Acid, Venous: 1.2 mmol/L (ref 0.5–1.9)

## 2023-09-27 LAB — PROCALCITONIN: Procalcitonin: <0.1 ng/mL

## 2023-09-27 MED ORDER — FENTANYL CITRATE (PF) 100 MCG/2ML IJ SOLN: 100.0000 ug | Freq: Once | INTRAMUSCULAR | Status: AC

## 2023-09-27 MED ORDER — NOREPINEPHRINE 4 MG/250ML-% IV SOLN
INTRAVENOUS | Status: AC
  Filled 2023-09-27: qty 250

## 2023-09-27 MED ORDER — CARBAMAZEPINE 200 MG PO TABS
200.0000 mg | ORAL_TABLET | Freq: Three times a day (TID) | ORAL | Status: DC
  Administered 2023-09-29 – 2023-10-03 (×12): 200 mg
  Filled 2023-09-27 (×17): qty 1

## 2023-09-27 MED ORDER — PROPOFOL 1000 MG/100ML IV EMUL
0.0000 ug/kg/min | INTRAVENOUS | Status: DC
  Administered 2023-09-27 (×3): 25 ug/kg/min via INTRAVENOUS
  Administered 2023-09-28: 20 ug/kg/min via INTRAVENOUS
  Administered 2023-09-28 – 2023-09-29 (×3): 25 ug/kg/min via INTRAVENOUS
  Filled 2023-09-27 (×7): qty 100

## 2023-09-27 MED ORDER — ROCURONIUM BROMIDE 10 MG/ML (PF) SYRINGE
PREFILLED_SYRINGE | INTRAVENOUS | Status: AC
  Administered 2023-09-27: 80 mg via INTRAVENOUS
  Filled 2023-09-27: qty 10

## 2023-09-27 MED ORDER — PANTOPRAZOLE SODIUM 40 MG IV SOLR
40.0000 mg | INTRAVENOUS | Status: DC
  Administered 2023-09-27 – 2023-09-30 (×4): 40 mg via INTRAVENOUS
  Filled 2023-09-27 (×4): qty 10

## 2023-09-27 MED ORDER — PROPOFOL 1000 MG/100ML IV EMUL
INTRAVENOUS | Status: AC
  Administered 2023-09-27: 40 ug/kg/min via INTRAVENOUS
  Filled 2023-09-27: qty 100

## 2023-09-27 MED ORDER — FENTANYL BOLUS VIA INFUSION: 25.0000 ug | INTRAVENOUS | Status: DC | PRN

## 2023-09-27 MED ORDER — AZITHROMYCIN 250 MG PO TABS: 500.0000 mg | ORAL_TABLET | Freq: Every day | ORAL | Status: DC

## 2023-09-27 MED ORDER — ATORVASTATIN CALCIUM 20 MG PO TABS
40.0000 mg | ORAL_TABLET | Freq: Every day | ORAL | Status: DC
  Administered 2023-09-29 – 2023-10-03 (×5): 40 mg
  Filled 2023-09-27 (×5): qty 2

## 2023-09-27 MED ORDER — ROCURONIUM BROMIDE 10 MG/ML (PF) SYRINGE: 100.0000 mg | PREFILLED_SYRINGE | Freq: Once | INTRAVENOUS | Status: AC

## 2023-09-27 MED ORDER — ETOMIDATE 2 MG/ML IV SOLN
INTRAVENOUS | Status: AC
  Administered 2023-09-27: 10 mg via INTRAVENOUS
  Filled 2023-09-27: qty 10

## 2023-09-27 MED ORDER — LEVOTHYROXINE SODIUM 100 MCG/5ML IV SOLN: 75.0000 ug | Freq: Every day | INTRAVENOUS | Status: DC

## 2023-09-27 MED ORDER — NOREPINEPHRINE 4 MG/250ML-% IV SOLN: 0.0000 ug/min | INTRAVENOUS | Status: DC

## 2023-09-27 MED ORDER — SODIUM CHLORIDE 0.9 % IV SOLN
100.0000 mg | Freq: Two times a day (BID) | INTRAVENOUS | Status: DC
  Administered 2023-09-27: 100 mg via INTRAVENOUS
  Filled 2023-09-27: qty 100

## 2023-09-27 MED ORDER — NOREPINEPHRINE 4 MG/250ML-% IV SOLN
INTRAVENOUS | Status: AC
  Administered 2023-09-27: 10 ug/min via INTRAVENOUS
  Filled 2023-09-27: qty 250

## 2023-09-27 MED ORDER — SODIUM CHLORIDE 0.9 % IV SOLN
500.0000 mg | INTRAVENOUS | Status: AC
  Administered 2023-09-27 – 2023-09-28 (×2): 500 mg via INTRAVENOUS
  Filled 2023-09-27 (×2): qty 5

## 2023-09-27 MED ORDER — POLYSACCHARIDE IRON COMPLEX 150 MG PO CAPS
150.0000 mg | ORAL_CAPSULE | Freq: Every day | ORAL | Status: DC
  Administered 2023-09-30 – 2023-10-03 (×4): 150 mg
  Filled 2023-09-27 (×7): qty 1

## 2023-09-27 MED ORDER — FUROSEMIDE 10 MG/ML IJ SOLN
40.0000 mg | Freq: Two times a day (BID) | INTRAMUSCULAR | Status: DC
  Administered 2023-09-27 – 2023-09-28 (×2): 40 mg via INTRAVENOUS
  Filled 2023-09-27 (×3): qty 4

## 2023-09-27 MED ORDER — NOREPINEPHRINE 16 MG/250ML-% IV SOLN
0.0000 ug/min | INTRAVENOUS | Status: DC
  Administered 2023-09-27: 2 ug/min via INTRAVENOUS
  Filled 2023-09-27: qty 250

## 2023-09-27 MED ORDER — ETOMIDATE 2 MG/ML IV SOLN: 10.0000 mg | Freq: Once | INTRAVENOUS | Status: AC

## 2023-09-27 MED ORDER — DEXTROSE 5 % IV SOLN: INTRAVENOUS | Status: AC

## 2023-09-27 MED ORDER — FENTANYL 2500MCG IN NS 250ML (10MCG/ML) PREMIX INFUSION
0.0000 ug/h | INTRAVENOUS | Status: DC
  Administered 2023-09-27: 25 ug/h via INTRAVENOUS
  Filled 2023-09-27: qty 250

## 2023-09-27 MED ORDER — FENTANYL CITRATE (PF) 100 MCG/2ML IJ SOLN
INTRAMUSCULAR | Status: AC
  Administered 2023-09-27: 100 ug via INTRAVENOUS
  Filled 2023-09-27: qty 2

## 2023-09-27 MED ORDER — PIPERACILLIN-TAZOBACTAM 3.375 G IVPB
3.3750 g | Freq: Three times a day (TID) | INTRAVENOUS | Status: DC
  Administered 2023-09-27 – 2023-09-28 (×4): 3.375 g via INTRAVENOUS
  Filled 2023-09-27 (×4): qty 50

## 2023-09-27 MED ORDER — CHLORHEXIDINE GLUCONATE CLOTH 2 % EX PADS
6.0000 | MEDICATED_PAD | Freq: Every day | CUTANEOUS | Status: DC
  Administered 2023-09-27 – 2023-10-05 (×9): 6 via TOPICAL

## 2023-09-27 NOTE — Procedures (Signed)
 ARTERIAL CATHETER INSERTION PROCEDURE NOTE  Michele James  [id number]  05-11-1955  Date:09/27/23  Time:1:50 AM   Provider Performing: Almarie DELENA Nose   Procedure: Insertion of Arterial Line (63379) with US  guidance (23062)   Indication(s) Blood pressure monitoring and/or need for frequent ABGs  Consent Unable to obtain consent due to emergent nature of procedure.  Anesthesia None  Time Out Verified patient identification, verified procedure, site/side was marked, verified correct patient position, special equipment/implants available, medications/allergies/relevant history reviewed, required imaging and test results available.  Sterile Technique Maximal sterile technique including full sterile barrier drape, hand hygiene, sterile gown, sterile gloves, mask, hair covering, sterile ultrasound probe cover (if used).  Procedure Description Area of catheter insertion was cleaned with chlorhexidine and draped in sterile fashion. With real-time ultrasound guidance an arterial catheter was placed into the right radial artery.  Appropriate arterial tracings confirmed on monitor.    Complications/Tolerance None; patient tolerated the procedure well.  EBL Minimal  Specimen(s) None   Almarie Nose, DNP, CCRN, FNP-C, AGACNP-BC Acute Care & Family Nurse Practitioner  [facility]  See Amion for personal pager PCCM on call pager [phone] until 7 am

## 2023-09-27 NOTE — Progress Notes (Signed)
 PT Cancellation Note  Patient Details Name: QUANIKA SOLEM MRN: [id number] DOB: May 03, 1955   Cancelled Treatment:    Reason Eval/Treat Not Completed: Medical issues which prohibited therapy Chart review completed this date. Pt with a change in status, currently intubated with a RASS score of -4. Will now sign off, consult when pt medically stable.   Rykker Coviello Romero-Perozo, SPT  09/27/2023, 10:18 AM

## 2023-09-27 NOTE — Progress Notes (Addendum)
 Upon arrival to the unit, patient is only responsive to loud verbal and physical stimulation. Her breathing was significantly labored and gasping.

## 2023-09-27 NOTE — Significant Event (Signed)
 Called rapid response for this patient because patient was hypoxic in the 60's in 3L, pt also was drowsy and unable to answer orientation question. Patient place in NRB and spo2 came back up to 100%, other VSS. Blood sugar is 143. Rapid response and provider came at the bedside. Gave 40 IV lasix  per order, checked VBG and patient was transferred to ICU.

## 2023-09-27 NOTE — Significant Event (Addendum)
       CROSS COVER NOTE  NAME: Michele James MRN: [id number] DOB : 1955/12/25 ATTENDING PHYSICIAN: Caleen Qualia, MD    Date of Service   09/27/2023   HPI/Events of Note   Responded to rapid response called due to hypoxic event in 70's.  HPI: Admitted 6/27 for acute  resp failure with hypoxia, CHF exacerbation, cerebral palsy, IDA, DM, GERD, PVD, glaucoma, hypothyroidism depression seizures Baseline no supplemental oxygen required. CTA chest negative for PE + effusion and atelectasis. BNP 1406.2 (ECHO 09/2022 grade1 diastolic dysfunction EF 60-65%) procal <0.10, WBC 6.0  Interventions   Assessment/Plan:    09/26/2023   11:58 PM 09/26/2023   11:56 PM 09/26/2023    7:36 PM  Vitals with BMI  Systolic 149 149 888  Diastolic 102 102 69  Pulse 116 115 90   Temp                       98.2 ax Patient 100 % on NRB in place  Course sonorous resp and lethargic, mentation altered from baseline per primary nurse Chronic deformity and contractures,  ST 108 on tele, gen edema 1-2+pitting BLE   Latest Reference Range & Units 09/26/23 23:59  pH, Ven 7.25 - 7.43  7.24 (L)  pCO2, Ven 44 - 60 mmHg 81 (HH)  pO2, Ven 32 - 45 mmHg 57 (H)  Acid-Base Excess 0.0 - 2.0 mmol/L 4.5 (H)  Bicarbonate 20.0 - 28.0 mmol/L 34.7 (H)  O2 Saturation % 82  Patient temperature  37.0  Collection site  VEIN  [facility]): Data is critically high (L): Data is abnormally low (H): Data is abnormally high  Acute respiratory failure with hypoxia and hypercapnia  Transfer to ICU CCM consult for respiratory support options, like intubation, not BIPAP candidate EKG 40 mg furosemide  stat now   CRITICAL CARE Performed by: Erminio Cone   Total critical care time: 30 minutes  Critical care time was exclusive of separately billable procedures and treating other patients.  Critical care was necessary to treat or prevent imminent or life-threatening deterioration.  Critical care was time spent personally by me on the  following activities: development of treatment plan with patient and/or surrogate as well as nursing, discussions with consultants, evaluation of patient's response to treatment, examination of patient, obtaining history from patient or surrogate, ordering and performing treatments and interventions, ordering and review of laboratory studies, ordering and review of radiographic studies, pulse oximetry and re-evaluation of patient's condition.    Erminio LITTIE Cone NP [facility] Cross Cover 7pm-7am - check amion for availability Pager [phone]

## 2023-09-27 NOTE — Procedures (Signed)
 INTUBATION PROCEDURE NOTE  LEASIA SWANN  [id number]  [birth year]  Date:09/27/23  Time:1:52 AM   Provider Performing:Lulubelle Simcoe A Kaiea Esselman   Procedure: Intubation (31500)  Indication(s) Respiratory Failure  Consent Unable to obtain consent due to emergent nature of procedure.  Anesthesia Etomidate, Fentanyl , and Rocuronium   Time Out Verified patient identification, verified procedure, site/side was marked, verified correct patient position, special equipment/implants available, medications/allergies/relevant history reviewed, required imaging and test results available.  Sterile Technique Usual hand hygeine, masks, and gloves were used  Procedure Description Patient positioned in bed supine.  Sedation given as noted above.  Patient was intubated with endotracheal tube using Glidescope.  View was Grade 3 only epiglottis .  Number of attempts was 2.  Colorimetric CO2 detector was consistent with tracheal placement.  Complications/Tolerance None; patient tolerated the procedure well. Chest X-ray is ordered to verify placement.  EBL Minimal  Specimen(s) None   Almarie Nose, DNP, CCRN, FNP-C, AGACNP-BC Acute Care & Family Nurse Practitioner  [facility]  See Amion for personal pager PCCM on call pager [phone] until 7 am

## 2023-09-27 NOTE — Plan of Care (Signed)
  Problem: Education: Goal: Ability to describe self-care measures that may prevent or decrease complications (Diabetes Survival Skills Education) will improve Outcome: Not Progressing Goal: Individualized Educational Video(s) Outcome: Not Progressing   Problem: Coping: Goal: Ability to adjust to condition or change in health will improve Outcome: Not Progressing   Problem: Fluid Volume: Goal: Ability to maintain a balanced intake and output will improve Outcome: Not Progressing   Problem: Health Behavior/Discharge Planning: Goal: Ability to identify and utilize available resources and services will improve Outcome: Not Progressing Goal: Ability to manage health-related needs will improve Outcome: Not Progressing   Problem: Metabolic: Goal: Ability to maintain appropriate glucose levels will improve Outcome: Not Progressing   Problem: Nutritional: Goal: Maintenance of adequate nutrition will improve Outcome: Not Progressing Goal: Progress toward achieving an optimal weight will improve Outcome: Not Progressing   Problem: Skin Integrity: Goal: Risk for impaired skin integrity will decrease Outcome: Not Progressing   Problem: Tissue Perfusion: Goal: Adequacy of tissue perfusion will improve Outcome: Not Progressing   Problem: Education: Goal: Knowledge of General Education information will improve Description: Including pain rating scale, medication(s)/side effects and non-pharmacologic comfort measures Outcome: Not Progressing   Problem: Health Behavior/Discharge Planning: Goal: Ability to manage health-related needs will improve Outcome: Not Progressing   Problem: Clinical Measurements: Goal: Ability to maintain clinical measurements within normal limits will improve Outcome: Not Progressing Goal: Will remain free from infection Outcome: Not Progressing Goal: Diagnostic test results will improve Outcome: Not Progressing Goal: Respiratory complications will  improve Outcome: Not Progressing Goal: Cardiovascular complication will be avoided Outcome: Not Progressing   Problem: Activity: Goal: Risk for activity intolerance will decrease Outcome: Not Progressing   Problem: Nutrition: Goal: Adequate nutrition will be maintained Outcome: Not Progressing   Problem: Coping: Goal: Level of anxiety will decrease Outcome: Not Progressing   Problem: Elimination: Goal: Will not experience complications related to bowel motility Outcome: Not Progressing Goal: Will not experience complications related to urinary retention Outcome: Not Progressing   Problem: Pain Managment: Goal: General experience of comfort will improve and/or be controlled Outcome: Not Progressing   Problem: Safety: Goal: Ability to remain free from injury will improve Outcome: Not Progressing   Problem: Skin Integrity: Goal: Risk for impaired skin integrity will decrease Outcome: Not Progressing   Problem: Education: Goal: Ability to demonstrate management of disease process will improve Outcome: Not Progressing Goal: Ability to verbalize understanding of medication therapies will improve Outcome: Not Progressing Goal: Individualized Educational Video(s) Outcome: Not Progressing   Problem: Activity: Goal: Capacity to carry out activities will improve Outcome: Not Progressing   Problem: Cardiac: Goal: Ability to achieve and maintain adequate cardiopulmonary perfusion will improve Outcome: Not Progressing   Problem: Activity: Goal: Ability to tolerate increased activity will improve Outcome: Not Progressing   Problem: Clinical Measurements: Goal: Ability to maintain a body temperature in the normal range will improve Outcome: Not Progressing   Problem: Respiratory: Goal: Ability to maintain adequate ventilation will improve Outcome: Not Progressing Goal: Ability to maintain a clear airway will improve Outcome: Not Progressing

## 2023-09-27 NOTE — Consult Note (Signed)
 NAME:  Michele James, MRN:  [id number], DOB:  Jan 30, 1956, LOS: 2 ADMISSION DATE:  09/25/2023, CONSULTATION DATE: 09/27/2023 REFERRING MD: Erminio Cone, NP REASON FOR CONSULT: Acute respiratory failure  HPI  68 y.o female with significant PMH of IDA, GERD, T2DM, anxiety and depression, glaucoma, HLD, hypothyroidism, seizure disorders, cerebral palsy, schizophreniform disorder and PVD who presented to the ED with chief complaints of shortness of breath, productive cough and bilateral lower extremity swelling.  Per ED notes, EMS noted sats 90% on room air and she was placed on 2 L nasal cannula.   ED Course: Initial vital signs showed HR of 83 beats/minute, BP 140/90 mm Hg, the RR 16 breaths/minute, and the oxygen saturation 100% on 2L and a temperature of 98.79F (36.7C).  Pertinent Labs/Diagnostics Findings: Na+/ K+: 134/5.4, glucose: 103 BUN/Cr.: 26/0.49  WBC: 5.9 K/L  Hgb/Hct: 11.9/37.9  PCT: negative <0.10  RVP-neg UA:+many bacteria, wbc>50 BNP: 1406 CXR> with small pleural effusions, patchy consolidation or atelectasis in both lower lung fields and a large hiatal hernia. CTA was negative for PE.  Small to moderate right and small left layering pleural effusion and bibasilar atelectasis.   Medication administered in the ED: Ceftriaxone  and azithromycin  for suspected pneumonia, Lasix  20 mg IV Disposition: Admitted to TRH service  See significant events below  Past Medical History  IDA, GERD, T2DM, anxiety and depression, glaucoma, HLD, hypothyroidism, seizure disorders, cerebral palsy, schizophreniform disorder and PVD   Significant Hospital Events   6/27:Admit to TRH service with new onset CHF and CAP 6/28: Stable on 5L 6/29: Rapid response called for Acute hypoxic respiratory Failure and altered mental status. Transferred to ICU, patient unresponsive with agonal respirations and increased work of breathing. Intubated emergently for airway protection.  Consults:   Cardiology  Procedures:  6/29:Intubation 6/29:Right Radial Art Line 6/29:Left internal jugular Central Line  Significant Diagnostic Tests:  6/27 CXR: IMPRESSION: 1. Low inspiration. 2. Small pleural effusions. 3. Patchy consolidation or atelectasis in both lower lung fields. 4. Large hiatal hernia. 5. Aortic atherosclerosis.   6/27 CTPE IMPRESSION: 1. No convincing evidence of pulmonary embolism. Streaky indistinct areas of low-attenuation throughout the main, right and left pulmonary arteries, favor mixing artifact. 2. Small to moderate right and small left layering pleural effusions. Moderate dependent bibasilar atelectasis. 3. Mosaic attenuation throughout both lungs, as can be seen with air trapping from small airways disease versus mosaic perfusion from pulmonary vascular disease.   Interim History / Subjective:      Micro Data:  6/27: SARS-CoV-2 PCR> negative 6/27: Influenza PCR> negative 6/27: Blood culture x2> 6/27: Strep pneumo urinary antigen> 6/27: Legionella urinary antigen> 6/29: MRSA PCR>>  6/29: RVP>>  Antimicrobials:  Doxycycline Ceftriaxone   OBJECTIVE  Blood pressure (!) 184/79, pulse 82, temperature 98.2 F (36.8 C), temperature source Axillary, resp. rate 19, height 5' 3 (1.6 m), weight 82.2 kg, SpO2 100%.    Vent Mode: PRVC FiO2 (%):  [100 %] 100 % Set Rate:  [18 bmp] 18 bmp Vt Set:  [450 mL] 450 mL PEEP:  [5 cmH20] 5 cmH20   Intake/Output Summary (Last 24 hours) at 09/27/2023 0230 Last data filed at 09/27/2023 0012 Gross per 24 hour  Intake 340 ml  Output 300 ml  Net 40 ml   Filed Weights   09/25/23 0703  Weight: 82.2 kg   Physical Examination  GENERAL: 68 year-old critically ill patient lying in the bed intubated and sedated EYES: PEERLA. No scleral icterus.  HEENT: Head atraumatic, normocephalic.  Oropharynx and nasopharynx clear.  NECK:  No JVD, supple  LUNGS: Decreased breath sounds bilaterally.   CARDIOVASCULAR: S1, S2  normal. No murmurs, rubs, or gallops.  ABDOMEN: Soft, NTND EXTREMITIES: 2+pitting edema of BLE, LUE contracture. Generalized edema. Capillary refill < 3 secs in all extremities. Pulses palpable distally. NEUROLOGIC: The patient is intubated and sedated . No focal neurological deficit appreciated.  SKIN:ulcer in right heel and midfoot.  Labs/imaging that I havepersonally reviewed  (right click and Reselect all SmartList Selections daily)    Portable Chest x-ray Result Date: 09/27/2023 CLINICAL DATA:  Central line placement EXAM: PORTABLE CHEST 1 VIEW COMPARISON:  Same day chest radiograph FINDINGS: Endotracheal tube tip in the mid intrathoracic trachea. Left IJ CVC tip in the right atrium. No pneumothorax. Similar patchy bilateral airspace opacities and layering pleural effusions. Stable cardiomediastinal silhouette. Aortic atherosclerotic calcification. IMPRESSION: 1. Left IJ CVC tip in the right atrium. No pneumothorax. 2. Similar patchy bilateral airspace opacities and layering pleural effusions. Electronically Signed   By: Norman Gatlin M.D.   On: 09/27/2023 02:10   DG Chest Port 1 View Result Date: 09/27/2023 CLINICAL DATA:  Endotracheal tube placement EXAM: PORTABLE CHEST 1 VIEW COMPARISON:  09/25/2023 FINDINGS: Endotracheal tube tip in the mid intrathoracic trachea. Low lung volumes. Stable cardiomediastinal silhouette. Aortic atherosclerotic calcification. Diffuse bronchial wall thickening compatible with bronchitis. Small bilateral pleural effusions and basilar airspace opacities. No pneumothorax. No displaced rib fracture. Remote left rib fractures. IMPRESSION: 1. Endotracheal tube tip in the mid intrathoracic trachea. 2. Small bilateral pleural effusions and basilar airspace opacities, atelectasis versus pneumonia. 3. Bronchitis. Electronically Signed   By: Norman Gatlin M.D.   On: 09/27/2023 00:51   CT Angio Chest PE W/Cm &/Or Wo Cm Result Date: 09/25/2023 CLINICAL DATA:  Pulmonary  embolism (PE) suspected, high prob, dyspnea EXAM: CT ANGIOGRAPHY CHEST WITH CONTRAST TECHNIQUE: Multidetector CT imaging of the chest was performed using the standard protocol during bolus administration of intravenous contrast. Multiplanar CT image reconstructions and MIPs were obtained to evaluate the vascular anatomy. RADIATION DOSE REDUCTION: This exam was performed according to the departmental dose-optimization program which includes automated exposure control, adjustment of the mA and/or kV according to patient size and/or use of iterative reconstruction technique. CONTRAST:  75mL OMNIPAQUE  IOHEXOL  350 MG/ML SOLN COMPARISON:  Chest radiograph from earlier today. 10/18/2022 chest CT angiogram. FINDINGS: Cardiovascular: Streaky indistinct areas of low-attenuation throughout the main, right and left pulmonary arteries, favor mixing artifact. No convincing filling defects in the pulmonary arteries to suggest pulmonary embolism. Atherosclerotic nonaneurysmal thoracic aorta. Normal caliber pulmonary arteries. Top-normal heart size. No significant pericardial fluid/thickening. Mediastinum/Nodes: No significant thyroid  nodules. Unremarkable esophagus. No pathologically enlarged axillary, mediastinal or hilar lymph nodes. Lungs/Pleura: No pneumothorax. Small to moderate right and small left layering pleural effusions. Moderate dependent bibasilar atelectasis. Mosaic attenuation throughout both lungs. Indistinct tiny 0.3 cm posterior right upper lobe solid pulmonary nodule on series 5/image 69, stable. No new significant pulmonary nodules in the aerated portions of the lungs. No acute consolidative airspace disease. Upper abdomen: No acute abnormality. Musculoskeletal: No aggressive appearing focal osseous lesions. Marked thoracic spondylosis. Review of the MIP images confirms the above findings. IMPRESSION: 1. No convincing evidence of pulmonary embolism. Streaky indistinct areas of low-attenuation throughout the  main, right and left pulmonary arteries, favor mixing artifact. 2. Small to moderate right and small left layering pleural effusions. Moderate dependent bibasilar atelectasis. 3. Mosaic attenuation throughout both lungs, as can be seen with air trapping from small airways disease  versus mosaic perfusion from pulmonary vascular disease. 4.  Aortic Atherosclerosis (ICD10-I70.0). Electronically Signed   By: Selinda DELENA Blue M.D.   On: 09/25/2023 10:54   DG Chest 1 View Result Date: 09/25/2023 CLINICAL DATA:  Shortness of breath and edema. EXAM: CHEST  1 VIEW COMPARISON:  AP Lat chest 02/06/2023 FINDINGS: Low inspiration on exam compared to the prior study. There are small pleural effusions. There is patchy consolidation or atelectasis in both lower lung fields. Superimposed chronic elevation right hemidiaphragm. There is a large hiatal hernia with stable mediastinum. Mild cardiomegaly without vascular prominence. There is aortic atherosclerosis. Degenerative change thoracic spine. No new osseous findings. IMPRESSION: 1. Low inspiration. 2. Small pleural effusions. 3. Patchy consolidation or atelectasis in both lower lung fields. 4. Large hiatal hernia. 5. Aortic atherosclerosis. Electronically Signed   By: Francis Quam M.D.   On: 09/25/2023 07:53     Labs   CBC: Recent Labs  Lab 09/22/23 1048 09/25/23 0823 09/26/23 0338  WBC 8.4 5.9 6.0  NEUTROABS 5.8 4.0  --   HGB 12.6 11.9* 11.7*  HCT 39.7 37.9 36.3  MCV 98.8 99.0 99.5  PLT 267 276 273   Basic Metabolic Panel: Recent Labs  Lab 09/22/23 1229 09/25/23 0823 09/26/23 0338  NA  --  134* 139  K  --  5.4* 4.4  CL  --  99 101  CO2  --  28 28  GLUCOSE  --  103* 93  BUN  --  26* 25*  CREATININE 0.60 0.49 0.65  CALCIUM   --  8.6* 8.3*   GFR: Estimated Creatinine Clearance: 70.2 mL/min (by C-G formula based on SCr of 0.65 mg/dL). Recent Labs  Lab 09/22/23 1048 09/25/23 0823 09/26/23 0338  PROCALCITON  --  <0.10  --   WBC 8.4 5.9 6.0     Liver Function Tests: Recent Labs  Lab 09/25/23 0823 09/26/23 0338  AST 23 23  ALT 33 26  ALKPHOS 85 80  BILITOT 0.8 0.9  PROT 6.2* 5.8*  ALBUMIN 3.2* 2.9*   No results for input(s): LIPASE, AMYLASE in the last 168 hours. No results for input(s): AMMONIA in the last 168 hours.  ABG    Component Value Date/Time   HCO3 34.7 (H) 09/26/2023 2359   O2SAT 82 09/26/2023 2359     Coagulation Profile: No results for input(s): INR, PROTIME in the last 168 hours.  Cardiac Enzymes: No results for input(s): CKTOTAL, CKMB, CKMBINDEX, TROPONINI in the last 168 hours.  HbA1C: Hgb A1c MFr Bld  Date/Time Value Ref Range Status  09/26/2023 03:38 AM 6.5 (H) 4.8 - 5.6 % Final    Comment:    (NOTE) Diagnosis of Diabetes The following HbA1c ranges recommended by the American Diabetes Association (ADA) may be used as an aid in the diagnosis of diabetes mellitus.  Hemoglobin             Suggested A1C NGSP%              Diagnosis  <5.7                   Non Diabetic  5.7-6.4                Pre-Diabetic  >6.4                   Diabetic  <7.0                   Glycemic control for  adults with diabetes.     CBG: Recent Labs  Lab 09/26/23 1120 09/26/23 1557 09/26/23 2144 09/26/23 2344 09/27/23 0008  GLUCAP 106* 99 143* 143* 151*   Review of Systems:   Unable to be obtained secondary to the patient's intubated and sedated status.   Past Medical History  She,  has a past medical history of Anemia, Arthritis, Cerebral palsy (HCC), Depression, Diabetes mellitus without complication (HCC), Diverticulosis, Dysphagia, Eczema, GERD (gastroesophageal reflux disease), Glaucoma, Hemorrhoids, Hyperlipidemia, Hypothyroidism, and Seizures (HCC).   Surgical History    Past Surgical History:  Procedure Laterality Date   BREAST BIOPSY Right 03/22/2020   stereo bx, ribbon clip,  FAT NECROSIS AND FIBROSIS   CATARACT EXTRACTION W/PHACO Right  02/06/2016   Procedure: CATARACT EXTRACTION PHACO AND INTRAOCULAR LENS PLACEMENT (IOC);  Surgeon: Dene Etienne, MD;  Location: [facility];  Service: Ophthalmology;  Laterality: Right;   CATARACT EXTRACTION W/PHACO Left 03/05/2016   Procedure: CATARACT EXTRACTION PHACO AND INTRAOCULAR LENS PLACEMENT (IOC);  Surgeon: Dene Etienne, MD;  Location: [facility];  Service: Ophthalmology;  Laterality: Left;   CHOLECYSTECTOMY N/A 11/16/2015   Procedure: LAPAROSCOPIC CHOLECYSTECTOMY;  Surgeon: Elgin Laurence III, MD;  Location: [facility] ORS;  Service: General;  Laterality: N/A;  attempted cholangiogram   COLONOSCOPY WITH PROPOFOL  N/A 12/25/2014   Procedure: COLONOSCOPY WITH PROPOFOL ;  Surgeon: Deward CINDERELLA Piedmont, MD;  Location: [facility] ENDOSCOPY;  Service: Gastroenterology;  Laterality: N/A;   COLONOSCOPY WITH PROPOFOL  N/A 04/14/2019   Procedure: COLONOSCOPY WITH PROPOFOL ;  Surgeon: Toledo, Ladell POUR, MD;  Location: [facility] ENDOSCOPY;  Service: Gastroenterology;  Laterality: N/A;   COLONOSCOPY WITH PROPOFOL  N/A 04/20/2022   Procedure: COLONOSCOPY WITH PROPOFOL ;  Surgeon: Therisa Bi, MD;  Location: [facility] END[facility]COPY;  Service: Gastroenterology;  Laterality: N/A;   ENDOSCOPIC RETROGRADE CHOLANGIOPANCREATOGRAPHY (ERCP) WITH PROPOFOL  N/A 11/18/2015   Procedure: ENDOSCOPIC RETROGRADE CHOLANGIOPANCREATOGRAPHY (ERCP) WITH PROPOFOL ;  Surgeon: Rogelia Copping, MD;  Location: [facility] ENDOSCOPY;  Service: Endoscopy;  Laterality: N/A;   ESOPHAGOGASTRODUODENOSCOPY (EGD) WITH PROPOFOL  N/A 04/14/2019   Procedure: ESOPHAGOGASTRODUODENOSCOPY (EGD) WITH PROPOFOL ;  Surgeon: Toledo, Ladell POUR, MD;  Location: [facility] ENDOSCOPY;  Service: Gastroenterology;  Laterality: N/A;   ESOPHAGOGASTRODUODENOSCOPY (EGD) WITH PROPOFOL  N/A 04/20/2022   Procedure: ESOPHAGOGASTRODUODENOSCOPY (EGD) WITH PROPOFOL ;  Surgeon: Therisa Bi, MD;  Location: [facility] ENDOSCOPY;  Service: Gastroenterology;  Laterality: N/A;   EYE SURGERY     Cataract Surgery     HERNIA REPAIR       Social History   reports that she quit smoking about 43 years ago. Her smoking use included cigarettes. She has never used smokeless tobacco. She reports that she does not drink alcohol  and does not use drugs.   Family History   Her family history includes Breast cancer in her maternal aunt; Diabetes Mellitus II in her maternal grandfather, maternal grandmother, and mother; High blood pressure in her father and mother; Hyperlipidemia in her mother; Osteoporosis in her mother; Stroke in her maternal grandfather, maternal grandmother, and mother.   Allergies Allergies  Allergen Reactions   Aminophylline Other (See Comments)    Headache   Cinnamon Other (See Comments)    Stomachache    Rice     Pt states it causes infection    Amoxicillin Rash   Latex Rash   Nsaids Rash   Penicillins Nausea And Vomiting   Potassium Nausea And Vomiting and Nausea Only   Potassium-Containing Compounds Nausea And Vomiting   Septra [Sulfamethoxazole-Trimethoprim] Rash   Sulfa Antibiotics Rash   Sulfasalazine Rash   Tape  Rash    Paper tape ok   Theophyllines Other (See Comments)    Headache      Home Medications  Prior to Admission medications   Medication Sig Start Date End Date Taking? Authorizing Provider  acetaminophen  (TYLENOL ) 325 MG tablet Take 325 mg by mouth every 6 (six) hours as needed.   Yes [provider]  alendronate  (FOSAMAX ) 70 MG tablet Take 70 mg by mouth once a week. Take with a full glass of water on an empty stomach.   Yes [provider]  ALPRAZolam  (XANAX ) 0.25 MG tablet Take 0.25 mg by mouth as needed for anxiety. Before pap smears   Yes [provider]  atorvastatin  (LIPITOR) 40 MG tablet Take 40 mg by mouth daily.   Yes [provider]  calcium  citrate-vitamin D  (CITRACAL+D) 315-200 MG-UNIT per tablet Take 1 tablet by mouth 2 (two) times daily.   Yes [provider]  carbamazepine  (TEGRETOL ) 200 MG tablet  Take 200 mg by mouth 3 (three) times daily.   Yes [provider]  Cholecalciferol  100 MCG (4000 UT) TABS Take 1,000 Units by mouth.   Yes [provider]  esomeprazole (NEXIUM) 40 MG capsule Take 40 mg by mouth daily at 12 noon.   Yes [provider]  famotidine  (PEPCID ) 20 MG tablet Take 20 mg by mouth 2 (two) times daily. 04/23/20  Yes [provider]  hydroxypropyl methylcellulose / hypromellose (ISOPTO TEARS / GONIOVISC) 2.5 % ophthalmic solution 1 drop as needed for dry eyes.   Yes [provider]  iron  polysaccharides (NIFEREX) 150 MG capsule Take 1 capsule (150 mg total) by mouth 2 (two) times daily for 30 days, THEN 1 capsule (150 mg total) daily. 04/21/22 09/25/23 Yes Von Bellis, MD  levothyroxine  (SYNTHROID ) 100 MCG tablet Take 100 mcg by mouth every morning. 03/04/21  Yes [provider]  magnesium  oxide (MAG-OX) 400 (240 Mg) MG tablet Take 1 tablet by mouth 3 (three) times daily. 08/25/23  Yes [provider]  naproxen (NAPROSYN) 375 MG tablet Take 375 mg by mouth 2 (two) times daily with a meal.   Yes [provider]  OLANZapine  (ZYPREXA ) 10 MG tablet Take 1 tablet (10 mg total) by mouth at bedtime. Patient taking differently: Take 10 mg by mouth at bedtime. Take 10 mg in addition to 15 mg tablet for total 25 mg at bedtime 06/10/23 09/25/23 Yes Hisada, Katheren, MD  OLANZapine  (ZYPREXA ) 15 MG tablet Take 1 tablet (15 mg total) by mouth at bedtime. Patient taking differently: Take 15 mg by mouth at bedtime. Take 15 mg in addition to one 10 mg tablet for total 25 mg at bedtime 09/03/23 12/02/23 Yes Hisada, Katheren, MD  OLANZapine  (ZYPREXA ) 5 MG tablet Take 1 tablet (5 mg total) by mouth daily. 05/12/23 09/25/23 Yes Hisada, Katheren, MD  PARoxetine  (PAXIL ) 20 MG tablet TAKE 1 TABLET BY MOUTH EVERY DAY 06/05/23  Yes Okey Barnie SAUNDERS, MD  potassium chloride  20 MEQ/15ML (10%) SOLN Take 10 mEq by mouth daily.   Yes [provider]   vitamin E 400 UNIT capsule Take 400 Units by mouth daily.   Yes [provider]  Scheduled Meds:  atorvastatin   40 mg Oral Daily   carbamazepine   200 mg Oral TID   Chlorhexidine Gluconate Cloth  6 each Topical Daily   doxycycline  100 mg Oral Q12H   famotidine   20 mg Oral BID   furosemide        furosemide   40  mg Intravenous Q12H   heparin   5,000 Units Subcutaneous Q8H   insulin aspart  0-6 Units Subcutaneous TID WC   iron  polysaccharides  150 mg Oral Daily   levothyroxine   100 mcg Oral Q0600   OLANZapine   25 mg Oral QHS   OLANZapine   5 mg Oral Daily   pantoprazole   40 mg Oral Daily   PARoxetine   20 mg Oral Daily   Continuous Infusions:  cefTRIAXone  (ROCEPHIN )  IV Stopped (09/26/23 1108)   fentaNYL  infusion INTRAVENOUS 25 mcg/hr (09/27/23 0400)   norepinephrine     norepinephrine (LEVOPHED) Adult infusion 2 mcg/min (09/27/23 0305)   propofol  (DIPRIVAN ) infusion 35 mcg/kg/min (09/27/23 0400)   PRN Meds:.acetaminophen , fentaNYL , furosemide , norepinephrine, ondansetron  **OR** ondansetron  (ZOFRAN ) IV   Active Hospital Problem list   See systems below  Assessment & Plan:  #Acute Hypoxic and Hypercapnic Respiratory Failure #New Onset CHF #CAP -full mechanical support 6-8cc/kg/Vt  -titrate FiO2, PEEP to maintain O2 sat >90%  -Lung protective ventilation  -PRN Chest X-ray & ABG -PRN and scheduled bronchodilators -SAT/SBT when appropriate   #Suspected Acute New onset CHF PMHx: HTN, HLD Echo 10/22/2022: LVEF >60%, Grade I Diastolic dysfunction -BNP>1406 -trend troponin -Echocardiogram ordered -Lasix  -Continue Atorvastatin  -Strict I&O -Cardiology consult   #Sepsis due to Suspected Pneumonia -F/u cultures, trend lactic/ PCT -Monitor WBC/ fever curve -Obtain MRSA nasal swab, RVP, legionella, strep urine Ag -IV antibiotics Ceftriaxone  AND Doxy -Gentle IVF hydration as needed -Pressors PRN for MAP goal >65 -Strict I/O's   #Sedation needs in setting of  mechanical ventilation #Acute Metabolic Encephalopathy -CT head if no improvement -Maintain a RASS goal of 0 to -1 -prn fentanyl , prop for RASS -1    #T2DM -CBG's q4; Target range of 140 to 180 -SSI -Follow ICU Hypo/Hyperglycemia protocol  #Seizure Disorder -on Tegretol  200 mg TID however unable to take po will switch to Keppra IV until able to tolerate po. -seizure precaution   #Hypothyroidism -Normal TSH -Continue Synthroid , switch to IV until able to tolerate po  #Depression/Anxiety #Schizophreniform d/o  -followed by psychiatry  -resume psych medications -xanax ,olanzapine ( 15mg  qhs),paxil   Best practice:  Diet:  NPO Pain/Anxiety/Delirium protocol (if indicated): Yes (RASS goal -1) VAP protocol (if indicated): Yes DVT prophylaxis: Subcutaneous Heparin  GI prophylaxis: PPI Glucose control:  SSI Yes Central venous access:  Yes, and it is still needed Arterial line:  Yes, and it is still needed Foley:  Yes, and it is still needed Mobility:  bed rest  PT consulted: N/A Last date of multidisciplinary goals of care discussion []  Code Status:  full code Disposition: ICU   = Goals of Care = Code Status Order: FULL  Primary Emergency Contact: Mariaha, [place], Home Phone: [phone]   Critical care time: 45 minutes        Almarie Nose DNP, CCRN, FNP-C, AGACNP-BC Acute Care & Family Nurse Practitioner [facility] PCCM on call pager [phone]

## 2023-09-27 NOTE — Procedures (Signed)
 CENTRAL VENOUS CATHETER INSERTION PROCEDURE NOTE  Michele James  [id number]  May 16, 1955  Date:09/27/23  Time:1:48 AM   Provider Performing:Michele James   Procedure: Insertion of Non-tunneled Central Venous Catheter(36556) with US  guidance (23062)   Indication(s) Medication administration and Difficult access  Consent Unable to obtain consent due to emergent nature of procedure.  Anesthesia Topical only with 1% lidocaine    Timeout Verified patient identification, verified procedure, site/side was marked, verified correct patient position, special equipment/implants available, medications/allergies/relevant history reviewed, required imaging and test results available.  Sterile Technique Maximal sterile technique including full sterile barrier drape, hand hygiene, sterile gown, sterile gloves, mask, hair covering, sterile ultrasound probe cover (if used).  Procedure Description Area of catheter insertion was cleaned with chlorhexidine and draped in sterile fashion.  With real-time ultrasound guidance a central venous catheter was placed into the left internal jugular vein. Nonpulsatile blood flow and easy flushing noted in all ports.  The catheter was sutured in place and sterile dressing applied.  Complications/Tolerance None; patient tolerated the procedure well. Chest X-ray is ordered to verify placement for internal jugular or subclavian cannulation.   Chest x-ray is not ordered for femoral cannulation.  EBL Minimal  Specimen(s) None   Michele Kathrene, DNP, CCRN, FNP-C, AGACNP-BC Acute Care & Family Nurse Practitioner  [facility]  See Amion for personal pager PCCM on call pager [phone] until 7 am

## 2023-09-28 ENCOUNTER — Ambulatory Visit (INDEPENDENT_AMBULATORY_CARE_PROVIDER_SITE_OTHER): Admitting: Vascular Surgery

## 2023-09-28 ENCOUNTER — Encounter: Payer: Self-pay | Admitting: Internal Medicine

## 2023-09-28 DIAGNOSIS — J90 Pleural effusion, not elsewhere classified: Secondary | ICD-10-CM

## 2023-09-28 DIAGNOSIS — J9601 Acute respiratory failure with hypoxia: Secondary | ICD-10-CM | POA: Diagnosis not present

## 2023-09-28 LAB — BASIC METABOLIC PANEL WITH GFR
Anion gap: 11 (ref 5–15)
BUN: 18 mg/dL (ref 8–23)
CO2: 30 mmol/L (ref 22–32)
Calcium: 7.9 mg/dL — ABNORMAL LOW (ref 8.9–10.3)
Chloride: 98 mmol/L (ref 98–111)
Creatinine, Ser: 0.46 mg/dL (ref 0.44–1.00)
GFR, Estimated: >60 mL/min (ref >60)
Glucose, Bld: 119 mg/dL — ABNORMAL HIGH (ref 70–99)
Potassium: 3 mmol/L — ABNORMAL LOW (ref 3.5–5.1)
Sodium: 139 mmol/L (ref 135–145)

## 2023-09-28 LAB — CBC
HCT: 35 % — ABNORMAL LOW (ref 36.0–46.0)
Hemoglobin: 11.6 g/dL — ABNORMAL LOW (ref 12.0–15.0)
MCH: 31.5 pg (ref 26.0–34.0)
MCHC: 33.1 g/dL (ref 30.0–36.0)
MCV: 95.1 fL (ref 80.0–100.0)
Platelets: 283 10*3/uL (ref 150–400)
RBC: 3.68 MIL/uL — ABNORMAL LOW (ref 3.87–5.11)
RDW: 14.6 % (ref 11.5–15.5)
WBC: 7.2 10*3/uL (ref 4.0–10.5)
nRBC: 0 % (ref 0.0–0.2)

## 2023-09-28 LAB — GLUCOSE, CAPILLARY
Glucose-Capillary: 100 mg/dL — ABNORMAL HIGH (ref 70–99)
Glucose-Capillary: 103 mg/dL — ABNORMAL HIGH (ref 70–99)
Glucose-Capillary: 106 mg/dL — ABNORMAL HIGH (ref 70–99)
Glucose-Capillary: 109 mg/dL — ABNORMAL HIGH (ref 70–99)
Glucose-Capillary: 118 mg/dL — ABNORMAL HIGH (ref 70–99)
Glucose-Capillary: 131 mg/dL — ABNORMAL HIGH (ref 70–99)

## 2023-09-28 LAB — MAGNESIUM
Magnesium: 1.7 mg/dL (ref 1.7–2.4)
Magnesium: 1.5 mg/dL — ABNORMAL LOW (ref 1.7–2.4)

## 2023-09-28 LAB — POTASSIUM: Potassium: 2.7 mmol/L — CL (ref 3.5–5.1)

## 2023-09-28 LAB — TRIGLYCERIDES: Triglycerides: 102 mg/dL (ref <150)

## 2023-09-28 LAB — PHOSPHORUS: Phosphorus: 3.2 mg/dL (ref 2.5–4.6)

## 2023-09-28 MED ORDER — INSULIN ASPART 100 UNIT/ML IJ SOLN
0.0000 [IU] | INTRAMUSCULAR | Status: DC
  Administered 2023-09-30: 1 [IU] via SUBCUTANEOUS
  Filled 2023-09-28 (×2): qty 1

## 2023-09-28 MED ORDER — MAGNESIUM SULFATE 2 GM/50ML IV SOLN
2.0000 g | Freq: Once | INTRAVENOUS | Status: AC
  Administered 2023-09-28: 2 g via INTRAVENOUS
  Filled 2023-09-28: qty 50

## 2023-09-28 MED ORDER — ORAL CARE MOUTH RINSE
15.0000 mL | OROMUCOSAL | Status: DC
  Administered 2023-09-28 – 2023-09-29 (×17): 15 mL via OROMUCOSAL

## 2023-09-28 MED ORDER — FUROSEMIDE 10 MG/ML IJ SOLN
80.0000 mg | Freq: Two times a day (BID) | INTRAMUSCULAR | Status: DC
  Administered 2023-09-28 – 2023-09-29 (×3): 80 mg via INTRAVENOUS
  Filled 2023-09-28 (×3): qty 8

## 2023-09-28 MED ORDER — POTASSIUM CHLORIDE 20 MEQ PO PACK: 40.0000 meq | PACK | Freq: Once | ORAL | Status: DC

## 2023-09-28 MED ORDER — METOPROLOL TARTRATE 25 MG PO TABS: 12.5000 mg | ORAL_TABLET | Freq: Two times a day (BID) | ORAL | Status: DC

## 2023-09-28 MED ORDER — METOPROLOL TARTRATE 5 MG/5ML IV SOLN: 2.5000 mg | Freq: Once | INTRAVENOUS | Status: DC

## 2023-09-28 MED ORDER — LEVOTHYROXINE SODIUM 100 MCG/5ML IV SOLN
75.0000 ug | Freq: Every day | INTRAVENOUS | Status: DC
  Filled 2023-09-28: qty 5

## 2023-09-28 MED ORDER — ORAL CARE MOUTH RINSE
15.0000 mL | OROMUCOSAL | Status: DC | PRN
Start: 2023-09-27 — End: 2023-10-05

## 2023-09-28 MED ORDER — FUROSEMIDE 10 MG/ML IJ SOLN
20.0000 mg | Freq: Once | INTRAMUSCULAR | Status: AC
  Administered 2023-09-28: 20 mg via INTRAVENOUS
  Filled 2023-09-28: qty 2

## 2023-09-28 MED ORDER — POTASSIUM CHLORIDE 10 MEQ/50ML IV SOLN
10.0000 meq | INTRAVENOUS | Status: AC
  Administered 2023-09-28 – 2023-09-29 (×4): 10 meq via INTRAVENOUS
  Filled 2023-09-28 (×4): qty 50

## 2023-09-28 MED ORDER — POTASSIUM CHLORIDE 10 MEQ/50ML IV SOLN
10.0000 meq | INTRAVENOUS | Status: AC
  Administered 2023-09-28 (×2): 10 meq via INTRAVENOUS
  Filled 2023-09-28 (×2): qty 50

## 2023-09-28 MED ORDER — LEVETIRACETAM (KEPPRA) 500 MG/5 ML ADULT IV PUSH
500.0000 mg | Freq: Two times a day (BID) | INTRAVENOUS | Status: DC
  Administered 2023-09-28 (×2): 500 mg via INTRAVENOUS
  Filled 2023-09-28 (×5): qty 5

## 2023-09-28 MED ORDER — SODIUM CHLORIDE 0.9 % IV SOLN
3.0000 g | Freq: Four times a day (QID) | INTRAVENOUS | Status: AC
  Administered 2023-09-28 – 2023-10-03 (×20): 3 g via INTRAVENOUS
  Filled 2023-09-28 (×20): qty 8

## 2023-09-28 NOTE — Consult Note (Signed)
 [name] CLINIC CARDIOLOGY CONSULT NOTE       Patient ID: Michele James MRN: [id number] DOB/AGE: Aug 10, 1955 68 y.o.  Admit date: 09/25/2023 Referring Physician Dr. Malka Primary Physician Glover Lenis, MD Primary Cardiologist None Reason for Consultation New onset CHF with rEF  HPI: Michele James is a 68 y.o. female  with a past medical history of type 2 diabetes mellitus, GERD, hypothyroidism, seizures, cerebral palsy, PVD who presented to the ED on 09/25/2023 for SOB, productive cough and bilateral lower extremity swelling. Patient admitted due to new onset CHF and CAP. On 06/29 rapid response called for acute hypoxic respiratory failure and AMS. Patient transferred to ICU and required intubation for airway protection. Cardiology was consulted for further evaluation of new onset HFrEF.  Patient presents to the ED with shortness of breath, productive cough, bilateral lower extremity swelling.  Majority of patient's history obtained from chart review due to patient intubated and sedated. Work up today and during admission notable for sodium 139, potassium 3.0, creatinine 0.46, hemoglobin 11.6, platelets 283, WBC 7.2.  BNP elevated but improving 1400 > 960.  Trops minimally elevated and flat 12 > 13 > 13 > 27 > 34.  EKG on 06/29 with sinus tachycardia, RVH, no acute ischemic changes, rate 122 bpm.  Patient has been receiving IV Lasix  20 mg twice daily for 2 days then IV Lasix  40 mg twice daily for past 2 days with okay UOP and stable renal function. UOP yesterday of 2.48L and - net negative.  Of note on 09/22/2023 CT reveals thick walled fluid and gas collection in the rectovesicular recess within the pelvic cul-de-sac consistent with an abscess.  At the time of my evaluation this afternoon, patient was intubated, sedated and on levo for blood pressure support.  Patient with new onset acute HFrEF, appears to have volume on. BP and HR stable on levo gtt. Renal function stable.  Review of  systems complete and found to be negative unless listed above    Past Medical History:  Diagnosis Date   Anemia    Arthritis    knees   Cerebral palsy (HCC)    Depression    Diabetes mellitus without complication (HCC)    Diverticulosis    Dysphagia    Eczema    GERD (gastroesophageal reflux disease)    Glaucoma    Hemorrhoids    Hyperlipidemia    Hypothyroidism    Seizures (HCC)    none in over 10 yrs    Past Surgical History:  Procedure Laterality Date   BREAST BIOPSY Right 03/22/2020   stereo bx, ribbon clip,  FAT NECROSIS AND FIBROSIS   CATARACT EXTRACTION W/PHACO Right 02/06/2016   Procedure: CATARACT EXTRACTION PHACO AND INTRAOCULAR LENS PLACEMENT (IOC);  Surgeon: Dene Etienne, MD;  Location: [facility];  Service: Ophthalmology;  Laterality: Right;   CATARACT EXTRACTION W/PHACO Left 03/05/2016   Procedure: CATARACT EXTRACTION PHACO AND INTRAOCULAR LENS PLACEMENT (IOC);  Surgeon: Dene Etienne, MD;  Location: [facility];  Service: Ophthalmology;  Laterality: Left;   CHOLECYSTECTOMY N/A 11/16/2015   Procedure: LAPAROSCOPIC CHOLECYSTECTOMY;  Surgeon: Elgin Laurence III, MD;  Location: [facility] ORS;  Service: General;  Laterality: N/A;  attempted cholangiogram   COLONOSCOPY WITH PROPOFOL  N/A 12/25/2014   Procedure: COLONOSCOPY WITH PROPOFOL ;  Surgeon: Deward CINDERELLA Piedmont, MD;  Location: [facility] ENDOSCOPY;  Service: Gastroenterology;  Laterality: N/A;   COLONOSCOPY WITH PROPOFOL  N/A 04/14/2019   Procedure: COLONOSCOPY WITH PROPOFOL ;  Surgeon: Aundria, Teodoro K, MD;  Location: [facility]  ENDOSCOPY;  Service: Gastroenterology;  Laterality: N/A;   COLONOSCOPY WITH PROPOFOL  N/A 04/20/2022   Procedure: COLONOSCOPY WITH PROPOFOL ;  Surgeon: Therisa Bi, MD;  Location: [facility] ENDOSCOPY;  Service: Gastroenterology;  Laterality: N/A;   ENDOSCOPIC RETROGRADE CHOLANGIOPANCREATOGRAPHY (ERCP) WITH PROPOFOL  N/A 11/18/2015   Procedure: ENDOSCOPIC RETROGRADE CHOLANGIOPANCREATOGRAPHY (ERCP)  WITH PROPOFOL ;  Surgeon: Rogelia Copping, MD;  Location: [facility] ENDOSCOPY;  Service: Endoscopy;  Laterality: N/A;   ESOPHAGOGASTRODUODENOSCOPY (EGD) WITH PROPOFOL  N/A 04/14/2019   Procedure: ESOPHAGOGASTRODUODENOSCOPY (EGD) WITH PROPOFOL ;  Surgeon: Toledo, Ladell POUR, MD;  Location: [facility] ENDOSCOPY;  Service: Gastroenterology;  Laterality: N/A;   ESOPHAGOGASTRODUODENOSCOPY (EGD) WITH PROPOFOL  N/A 04/20/2022   Procedure: ESOPHAGOGASTRODUODENOSCOPY (EGD) WITH PROPOFOL ;  Surgeon: Therisa Bi, MD;  Location: [facility] ENDOSCOPY;  Service: Gastroenterology;  Laterality: N/A;   EYE SURGERY     Cataract Surgery    HERNIA REPAIR      Medications Prior to Admission  Medication Sig Dispense Refill Last Dose/Taking   acetaminophen  (TYLENOL ) 325 MG tablet Take 325 mg by mouth every 6 (six) hours as needed.   Unknown   alendronate  (FOSAMAX ) 70 MG tablet Take 70 mg by mouth once a week. Take with a full glass of water on an empty stomach.   Past Week   ALPRAZolam  (XANAX ) 0.25 MG tablet Take 0.25 mg by mouth as needed for anxiety. Before pap smears   Unknown   atorvastatin  (LIPITOR) 40 MG tablet Take 40 mg by mouth daily.   09/24/2023   calcium  citrate-vitamin D  (CITRACAL+D) 315-200 MG-UNIT per tablet Take 1 tablet by mouth 2 (two) times daily.   09/24/2023   carbamazepine  (TEGRETOL ) 200 MG tablet Take 200 mg by mouth 3 (three) times daily.   09/24/2023   Cholecalciferol  100 MCG (4000 UT) TABS Take 1,000 Units by mouth.   09/24/2023   esomeprazole (NEXIUM) 40 MG capsule Take 40 mg by mouth daily at 12 noon.   09/24/2023   famotidine  (PEPCID ) 20 MG tablet Take 20 mg by mouth 2 (two) times daily.   09/24/2023   hydroxypropyl methylcellulose / hypromellose (ISOPTO TEARS / GONIOVISC) 2.5 % ophthalmic solution 1 drop as needed for dry eyes.   Unknown   iron  polysaccharides (NIFEREX) 150 MG capsule Take 1 capsule (150 mg total) by mouth 2 (two) times daily for 30 days, THEN 1 capsule (150 mg total) daily. 150 capsule 0 09/24/2023    levothyroxine  (SYNTHROID ) 100 MCG tablet Take 100 mcg by mouth every morning.   09/24/2023   magnesium  oxide (MAG-OX) 400 (240 Mg) MG tablet Take 1 tablet by mouth 3 (three) times daily.   09/24/2023   naproxen (NAPROSYN) 375 MG tablet Take 375 mg by mouth 2 (two) times daily with a meal.   09/24/2023   OLANZapine  (ZYPREXA ) 10 MG tablet Take 1 tablet (10 mg total) by mouth at bedtime. (Patient taking differently: Take 10 mg by mouth at bedtime. Take 10 mg in addition to 15 mg tablet for total 25 mg at bedtime) 90 tablet 0 09/24/2023   OLANZapine  (ZYPREXA ) 15 MG tablet Take 1 tablet (15 mg total) by mouth at bedtime. (Patient taking differently: Take 15 mg by mouth at bedtime. Take 15 mg in addition to one 10 mg tablet for total 25 mg at bedtime) 90 tablet 0 09/24/2023   OLANZapine  (ZYPREXA ) 5 MG tablet Take 1 tablet (5 mg total) by mouth daily. 90 tablet 0 09/24/2023 Morning   PARoxetine  (PAXIL ) 20 MG tablet TAKE 1 TABLET BY MOUTH EVERY DAY 90 tablet 1 09/24/2023  potassium chloride  20 MEQ/15ML (10%) SOLN Take 10 mEq by mouth daily.   09/24/2023   vitamin E 400 UNIT capsule Take 400 Units by mouth daily.   09/24/2023   Social History   Socioeconomic History   Marital status: Single    Spouse name: Not on file   Number of children: Not on file   Years of education: Not on file   Highest education level: Not on file  Occupational History   Not on file  Tobacco Use   Smoking status: Former    Current packs/day: 0.00    Types: Cigarettes    Quit date: 06/20/1980    Years since quitting: 43.3   Smokeless tobacco: Never   Tobacco comments:    smoked socially in early 1980s (reports for 4 months)  Vaping Use   Vaping status: Never Used  Substance and Sexual Activity   Alcohol  use: No   Drug use: No   Sexual activity: Not Currently  Other Topics Concern   Not on file  Social History Narrative   Not on file   Social Drivers of Health   Financial Resource Strain: Low Risk  (10/01/2022)    Received from [facility]   Overall Financial Resource Strain (CARDIA)    Difficulty of Paying Living Expenses: Not hard at all  Food Insecurity: Patient Unable To Answer (09/26/2023)   Hunger Vital Sign    Worried About Running Out of Food in the Last Year: Patient unable to answer    Ran Out of Food in the Last Year: Patient unable to answer  Transportation Needs: Patient Unable To Answer (09/26/2023)   PRAPARE - Transportation    Lack of Transportation (Medical): Patient unable to answer    Lack of Transportation (Non-Medical): Patient unable to answer  Physical Activity: Not on file  Stress: Not on file  Social Connections: Unknown (09/26/2023)   Social Connection and Isolation Panel    Frequency of Communication with Friends and Family: Patient unable to answer    Frequency of Social Gatherings with Friends and Family: Patient unable to answer    Attends Religious Services: Patient unable to answer    Active Member of Clubs or Organizations: Patient unable to answer    Attends Banker Meetings: Patient unable to answer    Marital Status: Never married  Intimate Partner Violence: Patient Unable To Answer (09/26/2023)   Humiliation, Afraid, Rape, and Kick questionnaire    Fear of Current or Ex-Partner: Patient unable to answer    Emotionally Abused: Patient unable to answer    Physically Abused: Patient unable to answer    Sexually Abused: Patient unable to answer    Family History  Problem Relation Age of Onset   High blood pressure Mother    Hyperlipidemia Mother    Diabetes Mellitus II Mother    Stroke Mother    Osteoporosis Mother    High blood pressure Father    Breast cancer Maternal Aunt    Stroke Maternal Grandfather    Diabetes Mellitus II Maternal Grandfather    Stroke Maternal Grandmother    Diabetes Mellitus II Maternal Grandmother      Vitals:   09/28/23 0630 09/28/23 0645 09/28/23 0700 09/28/23 0722  BP:      Pulse: 66 61 62    Resp: 16 18 18    Temp:      TempSrc:      SpO2: (!) 89% 93% 93% 92%  Weight:  Height:        PHYSICAL EXAM General: Chronically ill-appearing elderly female, well nourished, in no acute distress. HEENT: Normocephalic and atraumatic. Neck: No JVD.   Lungs: Intubated and sedated.  Mechanical breath sounds bilaterally. Heart: HRRR. Normal S1 and S2 without gallops or murmurs.  Abdomen: Non-distended appearing.  Msk: Normal strength and tone for age. Extremities: Warm and well perfused. No clubbing, cyanosis.  1+ pedal edema.  Neuro: Alert and oriented X 3. Psych: Answers questions appropriately.   Labs: Basic Metabolic Panel: Recent Labs    09/27/23 0213 09/28/23 0406  NA 137 139  K 3.5 3.0*  CL 96* 98  CO2 30 30  GLUCOSE 143* 119*  BUN 24* 18  CREATININE 0.53 0.46  CALCIUM  8.2* 7.9*  MG  --  1.7   Liver Function Tests: Recent Labs    09/26/23 0338 09/27/23 0213  AST 23 24  ALT 26 27  ALKPHOS 80 79  BILITOT 0.9 0.8  PROT 5.8* 5.9*  ALBUMIN 2.9* 3.1*   No results for input(s): LIPASE, AMYLASE in the last 72 hours. CBC: Recent Labs    09/27/23 0213 09/28/23 0406  WBC 9.4 7.2  HGB 11.6* 11.6*  HCT 35.4* 35.0*  MCV 97.3 95.1  PLT 235 283   Cardiac Enzymes: Recent Labs    09/25/23 1239 09/27/23 0213 09/27/23 1708  TROPONINIHS 13 27* 34*   BNP: Recent Labs    09/27/23 0213  BNP 962.0*   D-Dimer: No results for input(s): DDIMER in the last 72 hours. Hemoglobin A1C: Recent Labs    09/26/23 0338  HGBA1C 6.5*   Fasting Lipid Panel: Recent Labs    09/28/23 0406  TRIG 102   Thyroid  Function Tests: No results for input(s): TSH, T4TOTAL, T3FREE, THYROIDAB in the last 72 hours.  Invalid input(s): FREET3 Anemia Panel: No results for input(s): VITAMINB12, FOLATE, FERRITIN, TIBC, IRON , RETICCTPCT in the last 72 hours.   Radiology: ECHOCARDIOGRAM COMPLETE Result Date: 09/27/2023    ECHOCARDIOGRAM REPORT    Patient Name:   DOROTHYMAE MACIVER Date of Exam: 09/27/2023 Medical Rec #:  [id number]      Height:       63.0 in Accession #:    [id number]     Weight:       181.2 lb Date of Birth:  1955/11/10      BSA:          1.854 m Patient Age:    68 years       BP:           104/56 mmHg Patient Gender: F              HR:           62 bpm. Exam Location:  [facility] Procedure: 2D Echo, Color Doppler, Cardiac Doppler and Strain Analysis (Both            Spectral and Color Flow Doppler were utilized during procedure). Indications:     I50.31 Acute Diastolic CHF  History:         Patient has prior history of Echocardiogram examinations. Risk                  Factors:Hypertension and Dyslipidemia.  Sonographer:     L. Thornton-Maynard Referring Phys:  [id number] CAMILA A THOMAS Diagnosing Phys: Redell Cave MD  Sonographer Comments: Echo performed with patient supine and on artificial respirator. Global longitudinal strain was attempted. IMPRESSIONS  1. Left ventricular ejection fraction, by estimation,  is 35 to 40%. The left ventricle has moderately decreased function. The left ventricle demonstrates global hypokinesis. Left ventricular diastolic parameters are consistent with Grade I diastolic dysfunction (impaired relaxation). There is the interventricular septum is flattened in systole and diastole, consistent with right ventricular pressure and volume overload.  2. Right ventricular systolic function is moderately reduced. The right ventricular size is mildly enlarged. There is severely elevated pulmonary artery systolic pressure. The estimated right ventricular systolic pressure is 61.1 mmHg.  3. Right atrial size was severely dilated.  4. The mitral valve is normal in structure. No evidence of mitral valve regurgitation.  5. The aortic valve is tricuspid. Aortic valve regurgitation is mild. Aortic valve sclerosis is present, with no evidence of aortic valve stenosis. FINDINGS  Left Ventricle: Left ventricular ejection fraction,  by estimation, is 35 to 40%. The left ventricle has moderately decreased function. The left ventricle demonstrates global hypokinesis. The left ventricular internal cavity size was normal in size. There is no left ventricular hypertrophy. The interventricular septum is flattened in systole and diastole, consistent with right ventricular pressure and volume overload. Left ventricular diastolic parameters are consistent with Grade I diastolic dysfunction (impaired relaxation). Right Ventricle: The right ventricular size is mildly enlarged. No increase in right ventricular wall thickness. Right ventricular systolic function is moderately reduced. There is severely elevated pulmonary artery systolic pressure. The tricuspid regurgitant velocity is 3.81 m/s, and with an assumed right atrial pressure of 3 mmHg, the estimated right ventricular systolic pressure is 61.1 mmHg. Left Atrium: Left atrial size was normal in size. Right Atrium: Right atrial size was severely dilated. Pericardium: There is no evidence of pericardial effusion. Mitral Valve: The mitral valve is normal in structure. No evidence of mitral valve regurgitation. MV peak gradient, 2.9 mmHg. The mean mitral valve gradient is 1.0 mmHg. Tricuspid Valve: The tricuspid valve is normal in structure. Tricuspid valve regurgitation is mild. Aortic Valve: The aortic valve is tricuspid. Aortic valve regurgitation is mild. Aortic valve sclerosis is present, with no evidence of aortic valve stenosis. Aortic valve mean gradient measures 2.0 mmHg. Aortic valve peak gradient measures 3.3 mmHg. Aortic valve area, by VTI measures 2.43 cm. Pulmonic Valve: The pulmonic valve was grossly normal. Pulmonic valve regurgitation is not visualized. Aorta: The aortic root and ascending aorta are structurally normal, with no evidence of dilitation. Venous: IVC assessment for right atrial pressure unable to be performed due to mechanical ventilation. IAS/Shunts: No atrial level shunt  detected by color flow Doppler.  LEFT VENTRICLE PLAX 2D LVIDd:         3.70 cm     Diastology LVIDs:         2.90 cm     LV e' medial:    5.70 cm/s LV PW:         0.90 cm     LV E/e' medial:  7.6 LV IVS:        0.90 cm     LV e' lateral:   6.83 cm/s LVOT diam:     1.90 cm     LV E/e' lateral: 6.4 LV SV:         45 LV SV Index:   24 LVOT Area:     2.84 cm  LV Volumes (MOD) LV vol d, MOD A2C: 33.5 ml LV vol d, MOD A4C: 35.8 ml LV vol s, MOD A2C: 17.4 ml LV vol s, MOD A4C: 11.8 ml LV SV MOD A2C:     16.1 ml LV SV  MOD A4C:     35.8 ml LV SV MOD BP:      20.5 ml RIGHT VENTRICLE            IVC RV Basal diam:  4.40 cm    IVC diam: 2.10 cm RV Mid diam:    2.90 cm RV S prime:     8.15 cm/s TAPSE (M-mode): 0.7 cm LEFT ATRIUM             Index        RIGHT ATRIUM           Index LA diam:        3.30 cm 1.78 cm/m   RA Area:     24.80 cm LA Vol (A2C):   32.5 ml 17.53 ml/m  RA Volume:   97.20 ml  52.42 ml/m LA Vol (A4C):   26.3 ml 14.18 ml/m LA Biplane Vol: 30.2 ml 16.29 ml/m  AORTIC VALVE                    PULMONIC VALVE AV Area (Vmax):    2.58 cm     PV Vmax:       0.62 m/s AV Area (Vmean):   2.21 cm     PV Peak grad:  1.6 mmHg AV Area (VTI):     2.43 cm AV Vmax:           90.85 cm/s AV Vmean:          63.650 cm/s AV VTI:            0.184 m AV Peak Grad:      3.3 mmHg AV Mean Grad:      2.0 mmHg LVOT Vmax:         82.70 cm/s LVOT Vmean:        49.600 cm/s LVOT VTI:          0.158 m LVOT/AV VTI ratio: 0.86  AORTA Ao Root diam: 3.30 cm Ao Asc diam:  3.20 cm MITRAL VALVE               TRICUSPID VALVE MV Area (PHT): 2.87 cm    TR Peak grad:   58.1 mmHg MV Area VTI:   2.02 cm    TR Vmax:        381.00 cm/s MV Peak grad:  2.9 mmHg MV Mean grad:  1.0 mmHg    SHUNTS MV Vmax:       0.85 m/s    Systemic VTI:  0.16 m MV Vmean:      42.8 cm/s   Systemic Diam: 1.90 cm MV Decel Time: 264 msec MV E velocity: 43.50 cm/s MV A velocity: 75.50 cm/s MV E/A ratio:  0.58 Redell Cave MD Electronically signed by Redell Cave MD  Signature Date/Time: 09/27/2023/7:23:05 PM    Final    DG Abd 1 View Result Date: 09/27/2023 CLINICAL DATA:  [id number] Encounter for imaging study to confirm orogastric (OG) tube placement [id number] EXAM: ABDOMEN - 1 VIEW COMPARISON:  Earlier film of the same day, CT 09/22/2023 FINDINGS: Gastric tube overlies the medial left infrahilar chest and left upper abdomen corresponding to hiatal hernia seen on prior CT. The stomach is nondistended. visualized small bowel and colon normal. Lumbar levoscoliosis with multilevel spondylitic change. IMPRESSION: Gastric tube extends to the level of hiatal hernia. Electronically Signed   By: JONETTA Faes M.D.   On: 09/27/2023 10:30   DG Abd 1 View Result Date: 09/27/2023 CLINICAL DATA:  [id number]. Encounter for study to confirm nasogastric tube placement. EXAM: ABDOMEN - 1 VIEW COMPARISON:  PET-CT 07/02/2020, abdomen x-rays 10/21/2022 FINDINGS: There is A moderate sized hiatal hernia. NGT passes into the stomach below the diaphragm and is probably in the distal body of stomach. The bowel pattern is nonobstructive with moderate retained stool. The pelvis was excluded from the exam. There is no semi supine evidence of free air. No other significant radiographic findings. Advanced degenerative change lumbar spine. IMPRESSION: 1. NGT passes into the stomach below the diaphragm and is probably in the distal body of stomach. 2. Moderate sized hiatal hernia. 3. Nonobstructive bowel pattern with moderate retained stool. Electronically Signed   By: Francis Quam M.D.   On: 09/27/2023 05:11   Portable Chest x-ray Result Date: 09/27/2023 CLINICAL DATA:  Central line placement EXAM: PORTABLE CHEST 1 VIEW COMPARISON:  Same day chest radiograph FINDINGS: Endotracheal tube tip in the mid intrathoracic trachea. Left IJ CVC tip in the right atrium. No pneumothorax. Similar patchy bilateral airspace opacities and layering pleural effusions. Stable cardiomediastinal silhouette. Aortic atherosclerotic  calcification. IMPRESSION: 1. Left IJ CVC tip in the right atrium. No pneumothorax. 2. Similar patchy bilateral airspace opacities and layering pleural effusions. Electronically Signed   By: Norman Gatlin M.D.   On: 09/27/2023 02:10   DG Chest Port 1 View Result Date: 09/27/2023 CLINICAL DATA:  Endotracheal tube placement EXAM: PORTABLE CHEST 1 VIEW COMPARISON:  09/25/2023 FINDINGS: Endotracheal tube tip in the mid intrathoracic trachea. Low lung volumes. Stable cardiomediastinal silhouette. Aortic atherosclerotic calcification. Diffuse bronchial wall thickening compatible with bronchitis. Small bilateral pleural effusions and basilar airspace opacities. No pneumothorax. No displaced rib fracture. Remote left rib fractures. IMPRESSION: 1. Endotracheal tube tip in the mid intrathoracic trachea. 2. Small bilateral pleural effusions and basilar airspace opacities, atelectasis versus pneumonia. 3. Bronchitis. Electronically Signed   By: Norman Gatlin M.D.   On: 09/27/2023 00:51   CT Angio Chest PE W/Cm &/Or Wo Cm Result Date: 09/25/2023 CLINICAL DATA:  Pulmonary embolism (PE) suspected, high prob, dyspnea EXAM: CT ANGIOGRAPHY CHEST WITH CONTRAST TECHNIQUE: Multidetector CT imaging of the chest was performed using the standard protocol during bolus administration of intravenous contrast. Multiplanar CT image reconstructions and MIPs were obtained to evaluate the vascular anatomy. RADIATION DOSE REDUCTION: This exam was performed according to the departmental dose-optimization program which includes automated exposure control, adjustment of the mA and/or kV according to patient size and/or use of iterative reconstruction technique. CONTRAST:  75mL OMNIPAQUE  IOHEXOL  350 MG/ML SOLN COMPARISON:  Chest radiograph from earlier today. 10/18/2022 chest CT angiogram. FINDINGS: Cardiovascular: Streaky indistinct areas of low-attenuation throughout the main, right and left pulmonary arteries, favor mixing artifact. No  convincing filling defects in the pulmonary arteries to suggest pulmonary embolism. Atherosclerotic nonaneurysmal thoracic aorta. Normal caliber pulmonary arteries. Top-normal heart size. No significant pericardial fluid/thickening. Mediastinum/Nodes: No significant thyroid  nodules. Unremarkable esophagus. No pathologically enlarged axillary, mediastinal or hilar lymph nodes. Lungs/Pleura: No pneumothorax. Small to moderate right and small left layering pleural effusions. Moderate dependent bibasilar atelectasis. Mosaic attenuation throughout both lungs. Indistinct tiny 0.3 cm posterior right upper lobe solid pulmonary nodule on series 5/image 69, stable. No new significant pulmonary nodules in the aerated portions of the lungs. No acute consolidative airspace disease. Upper abdomen: No acute abnormality. Musculoskeletal: No aggressive appearing focal osseous lesions. Marked thoracic spondylosis. Review of the MIP images confirms the above findings. IMPRESSION: 1. No convincing evidence of pulmonary embolism. Streaky indistinct areas of low-attenuation throughout the main, right  and left pulmonary arteries, favor mixing artifact. 2. Small to moderate right and small left layering pleural effusions. Moderate dependent bibasilar atelectasis. 3. Mosaic attenuation throughout both lungs, as can be seen with air trapping from small airways disease versus mosaic perfusion from pulmonary vascular disease. 4.  Aortic Atherosclerosis (ICD10-I70.0). Electronically Signed   By: Selinda DELENA Blue M.D.   On: 09/25/2023 10:54   DG Chest 1 View Result Date: 09/25/2023 CLINICAL DATA:  Shortness of breath and edema. EXAM: CHEST  1 VIEW COMPARISON:  AP Lat chest 02/06/2023 FINDINGS: Low inspiration on exam compared to the prior study. There are small pleural effusions. There is patchy consolidation or atelectasis in both lower lung fields. Superimposed chronic elevation right hemidiaphragm. There is a large hiatal hernia with stable  mediastinum. Mild cardiomegaly without vascular prominence. There is aortic atherosclerosis. Degenerative change thoracic spine. No new osseous findings. IMPRESSION: 1. Low inspiration. 2. Small pleural effusions. 3. Patchy consolidation or atelectasis in both lower lung fields. 4. Large hiatal hernia. 5. Aortic atherosclerosis. Electronically Signed   By: Francis Quam M.D.   On: 09/25/2023 07:53   CT ANGIO AO+BIFEM W & OR WO CONTRAST Result Date: 09/23/2023 CLINICAL DATA:  Lower extremity claudication with soft tissue ulceration EXAM: CT ANGIOGRAPHY OF ABDOMINAL AORTA WITH ILIOFEMORAL RUNOFF TECHNIQUE: Multidetector CT imaging of the abdomen, pelvis and lower extremities was performed using the standard protocol during bolus administration of intravenous contrast. Multiplanar CT image reconstructions and MIPs were obtained to evaluate the vascular anatomy. RADIATION DOSE REDUCTION: This exam was performed according to the departmental dose-optimization program which includes automated exposure control, adjustment of the mA and/or kV according to patient size and/or use of iterative reconstruction technique. CONTRAST:  OMNIPAQUE  IOHEXOL  350 MG/ML SOLN COMPARISON:  Prior PET-CT 07/02/2020 FINDINGS: VASCULAR Aorta: Normal caliber aorta without aneurysm, dissection, vasculitis or significant stenosis. Celiac: Patent without evidence of aneurysm, dissection, vasculitis or significant stenosis. SMA: Patent without evidence of aneurysm, dissection, vasculitis or significant stenosis. Renals: Both main renal arteries are patent without evidence of aneurysm, dissection, vasculitis, fibromuscular dysplasia or significant stenosis. Small accessory artery to the right kidney. IMA: Patent without evidence of aneurysm, dissection, vasculitis or significant stenosis. RIGHT Lower Extremity Inflow: Common, internal and external iliac arteries are patent without evidence of aneurysm, dissection, vasculitis or significant  stenosis. Outflow: Common, superficial and profunda femoral arteries and the popliteal artery are patent without evidence of aneurysm, dissection, vasculitis or significant stenosis. Runoff: Patent three vessel runoff to the ankle. LEFT Lower Extremity Inflow: Common, internal and external iliac arteries are patent without evidence of aneurysm, dissection, vasculitis or significant stenosis. Outflow: Common, superficial and profunda femoral arteries and the popliteal artery are patent without evidence of aneurysm, dissection, vasculitis or significant stenosis. Runoff: Patent three vessel runoff to the ankle. Veins: No focal venous abnormality. Review of the MIP images confirms the above findings. NON-VASCULAR Lower chest: Marked enlargement of the main pulmonary artery at 3.2 cm suggesting pulmonary arterial hypertension. Cardiomegaly with right heart enlargement. No pericardial effusion. Moderate right and trace left pleural effusions. Large sliding hiatal hernia. Hepatobiliary: No focal liver abnormality is seen. Status post cholecystectomy. No biliary dilatation. Pancreas: Unremarkable. No pancreatic ductal dilatation or surrounding inflammatory changes. Spleen: Normal in size without focal abnormality. Adrenals/Urinary Tract: Adrenal glands are unremarkable. Kidneys are normal, without renal calculi, focal lesion, or hydronephrosis. Bladder is unremarkable. Stomach/Bowel: Large volume of formed stool in the rectum with a rectal stool ball measuring at least 9 x 7 x 6  cm. There is mild rectal wall thickening. No evidence of bowel obstruction. Normal appendix. Abnormal thickening of a distal loop of ileum in the low anatomic pelvis immediately adjacent to the large abscess collection. Lymphatic: No suspicious lymphadenopathy. Reproductive: Apparent postsurgical changes of hysterectomy. The uterus is no longer visualized although it was present on prior PET imaging from April of 2022. Other: Thick walled fluid and  gas collection in the recto vesicular recess measures approximately 6.8 x 4.4 x 4.4 cm. Diffuse diastasis recti. Small fat containing ventral abdominal hernia. 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 Inward rotation of the left lower extremity with severe degenerative arthritis. Marked reticulation of the subcutaneous fat affecting the body wall and bilateral lower extremities consistent with edema. IMPRESSION: VASCULAR 1. No evidence of hemodynamically significant arterial stenosis or occlusion. No evidence of significant peripheral arterial disease. 2. Trace aortic atherosclerotic vascular calcifications. Aortic Atherosclerosis (ICD10-I70.0). NON-VASCULAR 1. Thick walled fluid and gas collection in the recto vesicular recess within the pelvic cul-de-sac consistent with an abscess measuring 6.8 x 4.4 x 4.4 cm. There is an associated adjacent thick walled loop of small bowel which may represent chronic secondary inflammatory reaction if the source of the abscess is related to a prior hysterectomy, a source of chronic small bowel perforation, or potentially even lymphomatous involvement of the small bowel with contained perforation. 2. Extensive anasarca and bilateral lower extremity edema. Findings are favored to be related to elevated right heart pressures in the setting of pulmonary arterial hypertension. 3. Moderate right and trace left pleural effusions. 4. Moderate to large sliding hiatal hernia. 5. 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 6. Inward rotation of the left lower extremity with advanced knee joint degenerative osteoarthritis. These results will be called to the ordering clinician or representative by the Radiologist Assistant, and communication documented in the PACS or [employer]. Electronically Signed   By: Wilkie Lent M.D.   On: 09/23/2023 09:04   VAS US  LOWER EXTREMITY ARTERIAL DUPLEX Result Date:  09/07/2023 LOWER EXTREMITY ARTERIAL DUPLEX STUDY Patient Name:  ORVA RILES  Date of Exam:   09/07/2023 Medical Rec #: [id number]       Accession #:    [id number] Date of Birth: Jun 04, 1955       Patient Gender: F Patient Age:   60 years Exam [facility] Procedure:      VAS US  LOWER EXTREMITY ARTERIAL DUPLEX Referring Phys: CORDELLA SHAWL --------------------------------------------------------------------------------  Indications: Ulceration.  Current ABI: not done Limitations: Patient is unable to stand; exam was performed with patient upright              in a wheelchair Comparison Study: ABI on 06/29/23: Right=1.27(TBI=0.97) & Left=1.28(TBI=0.93) Performing Technologist: [name] Churn RT, RDMS, RVT  Examination Guidelines: A complete evaluation includes B-mode imaging, spectral Doppler, color Doppler, and power Doppler as needed of all accessible portions of each vessel. Bilateral testing is considered an integral part of a complete examination. Limited examinations for reoccurring indications may be performed as noted.  +-----------+--------+-----+--------+---------+-------------+ RIGHT      PSV cm/sRatioStenosisWaveform Comments      +-----------+--------+-----+--------+---------+-------------+ CFA Mid                                  not evaluated +-----------+--------+-----+--------+---------+-------------+ SFA Prox  not evaluated +-----------+--------+-----+--------+---------+-------------+ SFA Mid    77                   biphasic               +-----------+--------+-----+--------+---------+-------------+ SFA Distal 73                   biphasic               +-----------+--------+-----+--------+---------+-------------+ POP Prox   70                   biphasic               +-----------+--------+-----+--------+---------+-------------+ POP Distal 64                   triphasic               +-----------+--------+-----+--------+---------+-------------+ ATA Distal 36                   biphasic               +-----------+--------+-----+--------+---------+-------------+ PTA Distal 62                   triphasic              +-----------+--------+-----+--------+---------+-------------+ PERO Distal30                   biphasic               +-----------+--------+-----+--------+---------+-------------+ Limited evaluation of the right lower extremity due to limited patient mobility.  +--------------+-------+-----------+--------+--------+-----+--------+ Left PoplitealAP (cm)Transv (cm)WaveformStenosisShapeComments +--------------+-------+-----------+--------+--------+-----+--------+ Distal                                                        +--------------+-------+-----------+--------+--------+-----+--------+  Summary: Right: Patent SFA, popliteal and tibial arteries without evidence of significant stenosis. No evidence of significant arterial occlusive disease in the proximal vessels, based on Doppler waveforms and previous ABI.  See table(s) above for measurements and observations. Electronically signed by Cordella Shawl MD on 09/07/2023 at 5:25:09 PM.   Final     ECHO as above  TELEMETRY reviewed by me 09/28/2023: Sinus bradycardia, rate 50s  EKG reviewed by me: sinus tachycardia, rate 122 bpm with nonspecific ST-T wave changes.  Data reviewed by me 09/28/2023: last 24h vitals tele labs imaging I/O ED provider note, admission H&P.  Principal Problem:   Acute hypoxic respiratory failure (HCC) Active Problems:   Anxiety and depression   Chronic GERD   Hypothyroidism   Seizures (HCC)   Anemia   Diabetes mellitus without complication (HCC)   Pneumonia of both lungs due to infectious organism   UTI (urinary tract infection)   CHF exacerbation (HCC)   PVD (peripheral vascular disease) (HCC)   History of cerebral palsy    ASSESSMENT AND PLAN:  ANNALYNN CENTANNI is a 68 y.o. female  with a past medical history of type 2 diabetes mellitus, GERD, hypothyroidism, seizures, cerebral palsy, PVD who presented to the ED on 09/25/2023 for SOB, productive cough and bilateral lower extremity swelling. Patient admitted due to new onset CHF and CAP. On 06/29 rapid response called for acute hypoxic respiratory failure and AMS. Patient transferred to ICU and required intubation for airway protection. Cardiology was consulted  for further evaluation of new onset HFrEF.  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 Patient intubated and sedated. Trops minimally elevated and flat 12 > 13 > 27 > 34. BNP elevated but improving 1400 > 960. EKG on 06/29 with sinus tachycardia, RVH,  rate 122 bpm, without acute ischemic changes.  CTA with no evidence of acute pulmonary embolism.  Echo this admission with new findings of reduced EF(35-40%), moderate to severe RV systolic dysfunction, severely elevated PASP.  Prior echo on 09/2022 with preserved EF, no evidence of RV dysfunction or pulmonary hypertension.  Patient on levo gtt due to hemodynamic instability.   - Agree with increase of IV lasix  to 80 mg BID. Closely monitor renal function and UOP.  -Continue levo infusion. Wean when able. Recommend goal MAP > 65. -Continue home atorvastatin  40 mg daily per tube.  -Plan to optimize GDMT when patient off pressor and hemodynamically stable.  -Minimally elevated flat troponins in the setting of acute hypoxic respiratory failure, new onset moderate to severe RV dysfunction and acute HFrEF is most consistent with demand/supply ischemia and not ACS. -Consider outpatient ischemic evaluation.  -Acute hypoxic respiratory failure, sepsis with suspected aspiration pneumonia  management per critical care team.   This patient's plan of care was discussed and created with Dr. Wilburn and he is in agreement.  Signed: Dorene Comfort, PA-C  09/28/2023, 10:19 AM [facility]

## 2023-09-28 NOTE — Plan of Care (Signed)
  Problem: Clinical Measurements: Goal: Will remain free from infection Outcome: Progressing Goal: Respiratory complications will improve Outcome: Progressing   Problem: Pain Managment: Goal: General experience of comfort will improve and/or be controlled Outcome: Progressing   Problem: Respiratory: Goal: Ability to maintain adequate ventilation will improve Outcome: Progressing   Problem: Metabolic: Goal: Ability to maintain appropriate glucose levels will improve Outcome: Not Progressing   Problem: Nutritional: Goal: Maintenance of adequate nutrition will improve Outcome: Not Progressing Goal: Progress toward achieving an optimal weight will improve Outcome: Not Progressing   Problem: Skin Integrity: Goal: Risk for impaired skin integrity will decrease Outcome: Not Progressing   Problem: Education: Goal: Knowledge of General Education information will improve Description: Including pain rating scale, medication(s)/side effects and non-pharmacologic comfort measures Outcome: Not Progressing   Problem: Health Behavior/Discharge Planning: Goal: Ability to manage health-related needs will improve Outcome: Not Progressing   Problem: Clinical Measurements: Goal: Cardiovascular complication will be avoided Outcome: Not Progressing   Problem: Nutrition: Goal: Adequate nutrition will be maintained Outcome: Not Progressing   Problem: Coping: Goal: Level of anxiety will decrease Outcome: Not Progressing   Problem: Elimination: Goal: Will not experience complications related to bowel motility Outcome: Not Progressing Goal: Will not experience complications related to urinary retention Outcome: Not Progressing

## 2023-09-28 NOTE — Progress Notes (Signed)
 When entering the oral care protocol, the order was flagged for the patient's allergy to aminophylline. Discussed the risk to benefit with pharmacy, the pharmacist gave the okay to go forward and order the CHG mouth swabs for the patient.

## 2023-09-28 NOTE — Progress Notes (Signed)
 Heart Failure Navigator Progress Note  Assessed for Heart & Vascular TOC clinic readiness.  Patient does not meet criteria due to current [facility] patient.   Navigator will sign off at this time.  Roxy Horseman, RN, BSN [facility] Heart Failure Navigator Secure Chat Only

## 2023-09-28 NOTE — Progress Notes (Signed)
 NAME:  Michele James, MRN:  [id number], DOB:  [birth year], LOS: 3 ADMISSION DATE:  09/25/2023  History of Present Illness:  68 y.o female with significant PMH of IDA, GERD, T2DM, anxiety and depression, glaucoma, HLD, hypothyroidism, seizure disorders, cerebral palsy, schizophreniform disorder and PVD who presented to the ED with chief complaints of shortness of breath, productive cough and bilateral lower extremity swelling.   Per ED notes, EMS noted sats 90% on room air and she was placed on 2 L nasal cannula.   ED Course: Initial vital signs showed HR of 83 beats/minute, BP 140/90 mm Hg, the RR 16 breaths/minute, and the oxygen saturation 100% on 2L and a temperature of 98.28F (36.7C).  Pertinent Labs/Diagnostics Findings: Na+/ K+: 134/5.4, glucose: 103 BUN/Cr.: 26/0.49  WBC: 5.9 K/L  Hgb/Hct: 11.9/37.9  PCT: negative <0.10  RVP-neg UA:+many bacteria, wbc>50 BNP: 1406 CXR> with small pleural effusions, patchy consolidation or atelectasis in both lower lung fields and a large hiatal hernia. CTA was negative for PE.  Small to moderate right and small left layering pleural effusion and bibasilar atelectasis.   Medication administered in the ED: Ceftriaxone  and azithromycin  for suspected pneumonia, Lasix  20 mg IV Disposition: Admitted to [facility] service   See significant events below  Pertinent  Medical History  IDA, GERD, T2DM, anxiety and depression, glaucoma, HLD, hypothyroidism, seizure disorders, cerebral palsy, schizophreniform disorder and PVD   Significant Hospital Events: Including procedures, antibiotic start and stop dates in addition to other pertinent events   6/27:Admit to TRH service with new onset CHF and CAP 6/28: Stable on 5L 6/29: Rapid response called for Acute hypoxic respiratory Failure and altered mental status. Transferred to ICU, patient unresponsive with agonal respirations and increased work of breathing. Intubated emergently for airway protection.   Interim  History / Subjective:  Patient is awake and follows commands, on minimal vent settings. However remains with thick secretions.  US  at bedside with improved pleural effusion. RLL consolidation seen.   Objective    Blood pressure (!) 104/56, pulse 62, temperature 98.1 F (36.7 C), temperature source Axillary, resp. rate 18, height 5' 3 (1.6 m), weight 82.2 kg, SpO2 92%.    Vent Mode: PRVC FiO2 (%):  [30 %-40 %] 30 % Set Rate:  [18 bmp] 18 bmp Vt Set:  [450 mL] 450 mL PEEP:  [5 cmH20] 5 cmH20 Plateau Pressure:  [9 cmH20-17 cmH20] 17 cmH20   Intake/Output Summary (Last 24 hours) at 09/28/2023 0943 Last data filed at 09/28/2023 0600 Gross per 24 hour  Intake 1516.06 ml  Output 2480 ml  Net -963.94 ml   Filed Weights   09/25/23 0703  Weight: 82.2 kg    Examination: General: Intubated, RASS +1, follows commands.  HENT: Supple neck, reactive pupils Lungs: Coarse breath sounds bilaterally Cardiovascular: Normal S1, Normal S2, RRR  Abdomen: Soft, non tender, non distended  Extremities: Edema to the thighs.   Labs and imaging were reviewed.   Assessment and Plan  68 year old female with multiple medical problems who presented to the hospital with acute hypoxic respiratory failure, with signs of decompensated heart failure. She was admitted to the floor and decompensated overnight, requiring admission to the ICU following intubation and mechanical ventilation.   #Acute Hypoxic Respiratory Failure #Right Pleural Effusion #Acute Decompensated Heart Failure - Echo w/ EF 35 to 40%, global hypokinesis, RV systolic function is moderately reduced. Severe PH with PASP 61.53mmHg.  #Sepsis with suspected aspiration pneumonia #Toxic Metabolic Encephalopathy #T2DM #Hypothyroidism #Schizophrenia  Neuro - on propofol  for sedation and fentanyl  for analgesia, goal RASS 0 to-1 and CPOT < 2. Restart home antipsychotics once enteral access achieved.  CV - concern for decompensated heart failure,  RV failure likely decomp in the setting of pneumonia. CTA chest neg for PE. Increase IV furosemide  to 80mg  BID which we've continued. On minimal vasopressor support secondary to sedation related hypotension. Appreciate cardiology input.  Pulm/ID - Sputum culture, can de-escalate zosyn  to ceftriaxone . Passed SBT however secretions remain an obstacle to extubation. GI - Fluoro NG placement. Start Tube feeds with goal to come off D5 Renal - kidney function at baseline, continue to trend Hem/Onc - on DVT prophylaxis with heparin  Endo - ICU glycemic protocol, levothyroxine   Best Practice (right click and Reselect all SmartList Selections daily)   Diet/type: NPO until NGT placed with fluoro DVT prophylaxis prophylactic heparin   Pressure ulcer(s): N/A GI prophylaxis: PPI Lines: yes and it is still needed Foley:  Yes, and it is still needed Code Status:  full code  Last date of multidisciplinary goals of care discussion [09/28/2023]  Critical care time: 60 minutes    Darrin Barn, MD [place] [facility] 09/28/2023 10:01 AM

## 2023-09-28 NOTE — Progress Notes (Signed)
 Initial Nutrition Assessment  DOCUMENTATION CODES:   Not applicable  INTERVENTION:   If tube feeds initiated, recommend:  Vital 1.2@45ml /hr- Initiate at 63ml/hr and increase by 10ml/hr q 8 hours until goal rate is reached.   ProSource TF 20- Give 60ml daily via tube, each supplement provides 80kcal and 20g of protein.   Free water flushes 30ml q4 hours to maintain tube patency   Regimen provides 1376kcal/day, 101g/day protein and 1056ml/day of free water.   MVI daily via tube   Pt at high refeed risk; recommend monitor potassium, magnesium  and phosphorus labs daily until stable  Daily weights   NUTRITION DIAGNOSIS:   Inadequate oral intake related to inability to eat (pt sedated and ventilated) as evidenced by estimated needs.  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  Pt sedated and ventilated. OGT removed as MD did not feel it was in good position in the hiatal hernia. Pt does not currently have enteral access. Plan is for possible extubation tomorrow. Will plan on fluoroscopy guided NGT placement if pt does not extubate. Pt documented to be eating 75% of meals prior to intubation. Per chart, pt is up ~23lbs from her UBW if her admission weight is correct.   Medications reviewed and include: lasix , heparin , iron , synthroid , protonix , unasyn , azithromycin , 5% dextrose  @40ml /hr, levophed, propofol    Labs reviewed: K 3.0(L), Mg 1.7(L) BNP- 962.0(H)- 6/29 Cbgs- 131, 100,  106, 109 x 24 hrs AIC 6.5(H)- 6/28  Patient is currently intubated on ventilator support MV: 7.1 L/min Temp (24hrs), Avg:98 F (36.7 C), Min:97.9 F (36.6 C), Max:98.1 F (36.7 C)  Propofol : 9.41ml/hr- provides 260kcal/day   MAP >66mmHg   UOP-   NUTRITION - FOCUSED PHYSICAL EXAM:  Flowsheet Row Most Recent Value  Orbital Region No depletion  Upper Arm Region Moderate depletion  Thoracic and Lumbar Region No depletion  Buccal Region No depletion  Temple Region No depletion  Clavicle Bone Region No depletion  Clavicle and Acromion Bone Region No depletion  Scapular Bone Region No depletion  Dorsal Hand No depletion  Patellar Region Moderate depletion  Anterior Thigh Region Moderate depletion  Posterior Calf Region Moderate depletion  Edema (RD Assessment) Moderate  Hair Reviewed  Eyes Reviewed  Mouth Reviewed  Skin Reviewed  Nails Reviewed   Diet Order:   Diet Order             Diet NPO time specified  Diet effective now                  EDUCATION NEEDS:   No education needs have been identified at this time  Skin:  Skin Assessment: Reviewed RN Assessment  Last BM:  6/30- type 5  Height:   Ht Readings from Last 1 Encounters:  09/27/23 5' 3 (1.6 m)    Weight:   Wt Readings from Last 1 Encounters:  09/25/23 82.2 kg    Ideal Body Weight:  52.3 kg  BMI:  Body mass index is 32.1 kg/m.  Estimated Nutritional Needs:   Kcal:  1321kcal/day  Protein:  95-105g/day  Fluid:  1.4-1.6L/day  Augustin Shams MS, RD, LDN If unable to be reached, please send secure chat to RD inpatient available from 8:00a-4:00p daily

## 2023-09-29 DIAGNOSIS — J9601 Acute respiratory failure with hypoxia: Secondary | ICD-10-CM | POA: Diagnosis not present

## 2023-09-29 DIAGNOSIS — J90 Pleural effusion, not elsewhere classified: Secondary | ICD-10-CM | POA: Diagnosis not present

## 2023-09-29 LAB — CBC
HCT: 34.7 % — ABNORMAL LOW (ref 36.0–46.0)
Hemoglobin: 11.3 g/dL — ABNORMAL LOW (ref 12.0–15.0)
MCH: 31.7 pg (ref 26.0–34.0)
MCHC: 32.6 g/dL (ref 30.0–36.0)
MCV: 97.5 fL (ref 80.0–100.0)
Platelets: 251 10*3/uL (ref 150–400)
RBC: 3.56 MIL/uL — ABNORMAL LOW (ref 3.87–5.11)
RDW: 14.6 % (ref 11.5–15.5)
WBC: 7.2 10*3/uL (ref 4.0–10.5)
nRBC: 0 % (ref 0.0–0.2)

## 2023-09-29 LAB — GLUCOSE, CAPILLARY
Glucose-Capillary: 102 mg/dL — ABNORMAL HIGH (ref 70–99)
Glucose-Capillary: 107 mg/dL — ABNORMAL HIGH (ref 70–99)
Glucose-Capillary: 124 mg/dL — ABNORMAL HIGH (ref 70–99)
Glucose-Capillary: 148 mg/dL — ABNORMAL HIGH (ref 70–99)
Glucose-Capillary: 88 mg/dL (ref 70–99)
Glucose-Capillary: 93 mg/dL (ref 70–99)
Glucose-Capillary: 93 mg/dL (ref 70–99)

## 2023-09-29 LAB — BLOOD GAS, VENOUS
Acid-Base Excess: 4.5 mmol/L — ABNORMAL HIGH (ref 0.0–2.0)
Bicarbonate: 34.7 mmol/L — ABNORMAL HIGH (ref 20.0–28.0)
O2 Saturation: 82 %
Patient temperature: 37
pCO2, Ven: 81 mmHg (ref 44–60)
pH, Ven: 7.24 — ABNORMAL LOW (ref 7.25–7.43)
pO2, Ven: 57 mmHg — ABNORMAL HIGH (ref 32–45)

## 2023-09-29 LAB — BASIC METABOLIC PANEL WITH GFR
Anion gap: 10 (ref 5–15)
Anion gap: 11 (ref 5–15)
Anion gap: 10 (ref 5–15)
BUN: 11 mg/dL (ref 8–23)
BUN: 9 mg/dL (ref 8–23)
BUN: 11 mg/dL (ref 8–23)
CO2: 30 mmol/L (ref 22–32)
CO2: 32 mmol/L (ref 22–32)
CO2: 29 mmol/L (ref 22–32)
Calcium: 7.8 mg/dL — ABNORMAL LOW (ref 8.9–10.3)
Calcium: 8.1 mg/dL — ABNORMAL LOW (ref 8.9–10.3)
Calcium: 7.8 mg/dL — ABNORMAL LOW (ref 8.9–10.3)
Chloride: 99 mmol/L (ref 98–111)
Chloride: 97 mmol/L — ABNORMAL LOW (ref 98–111)
Chloride: 93 mmol/L — ABNORMAL LOW (ref 98–111)
Creatinine, Ser: 0.39 mg/dL — ABNORMAL LOW (ref 0.44–1.00)
Creatinine, Ser: 0.4 mg/dL — ABNORMAL LOW (ref 0.44–1.00)
Creatinine, Ser: 0.45 mg/dL (ref 0.44–1.00)
GFR, Estimated: >60 mL/min (ref >60)
GFR, Estimated: >60 mL/min (ref >60)
GFR, Estimated: >60 mL/min (ref >60)
Glucose, Bld: 108 mg/dL — ABNORMAL HIGH (ref 70–99)
Glucose, Bld: 118 mg/dL — ABNORMAL HIGH (ref 70–99)
Glucose, Bld: 92 mg/dL (ref 70–99)
Potassium: 3.8 mmol/L (ref 3.5–5.1)
Potassium: 3.3 mmol/L — ABNORMAL LOW (ref 3.5–5.1)
Potassium: 4.2 mmol/L (ref 3.5–5.1)
Sodium: 139 mmol/L (ref 135–145)
Sodium: 140 mmol/L (ref 135–145)
Sodium: 132 mmol/L — ABNORMAL LOW (ref 135–145)

## 2023-09-29 LAB — PHOSPHORUS: Phosphorus: 2.8 mg/dL (ref 2.5–4.6)

## 2023-09-29 LAB — MAGNESIUM: Magnesium: 2.1 mg/dL (ref 1.7–2.4)

## 2023-09-29 MED ORDER — POTASSIUM CHLORIDE 10 MEQ/50ML IV SOLN
10.0000 meq | INTRAVENOUS | Status: AC
  Administered 2023-09-29 (×2): 10 meq via INTRAVENOUS
  Filled 2023-09-29 (×2): qty 50

## 2023-09-29 MED ORDER — LEVETIRACETAM 500 MG PO TABS
500.0000 mg | ORAL_TABLET | Freq: Two times a day (BID) | ORAL | Status: DC
  Administered 2023-09-29 – 2023-09-30 (×3): 500 mg via ORAL
  Filled 2023-09-29 (×3): qty 1

## 2023-09-29 MED ORDER — LEVOTHYROXINE SODIUM 100 MCG PO TABS
100.0000 ug | ORAL_TABLET | Freq: Every day | ORAL | Status: DC
  Administered 2023-09-29 – 2023-10-05 (×7): 100 ug via ORAL
  Filled 2023-09-29 (×7): qty 1

## 2023-09-29 MED ORDER — LEVOTHYROXINE SODIUM 100 MCG PO TABS: 100.0000 ug | ORAL_TABLET | Freq: Every day | ORAL | Status: DC

## 2023-09-29 MED ORDER — POTASSIUM CHLORIDE 20 MEQ PO PACK
40.0000 meq | PACK | ORAL | Status: AC
  Administered 2023-09-29 (×2): 40 meq via ORAL
  Filled 2023-09-29 (×2): qty 2

## 2023-09-29 MED ORDER — ORAL CARE MOUTH RINSE: 15.0000 mL | OROMUCOSAL | Status: DC | PRN

## 2023-09-29 MED ORDER — POLYVINYL ALCOHOL 1.4 % OP SOLN
1.0000 [drp] | OPHTHALMIC | Status: DC | PRN
  Administered 2023-09-29 – 2023-10-03 (×4): 1 [drp] via OPHTHALMIC
  Filled 2023-09-29: qty 15

## 2023-09-29 NOTE — Progress Notes (Signed)
 NAME:  WILLY VORCE, MRN:  [id number], DOB:  05/26/1955, LOS: 4 ADMISSION DATE:  09/25/2023  History of Present Illness:  68 y.o female with significant PMH of IDA, GERD, T2DM, anxiety and depression, glaucoma, HLD, hypothyroidism, seizure disorders, cerebral palsy, schizophreniform disorder and PVD who presented to the ED with chief complaints of shortness of breath, productive cough and bilateral lower extremity swelling.   Per ED notes, EMS noted sats 90% on room air and she was placed on 2 L nasal cannula.   ED Course: Initial vital signs showed HR of 83 beats/minute, BP 140/90 mm Hg, the RR 16 breaths/minute, and the oxygen saturation 100% on 2L and a temperature of 98.17F (36.7C).  Pertinent Labs/Diagnostics Findings: Na+/ K+: 134/5.4, glucose: 103 BUN/Cr.: 26/0.49  WBC: 5.9 K/L  Hgb/Hct: 11.9/37.9  PCT: negative <0.10  RVP-neg UA:+many bacteria, wbc>50 BNP: 1406 CXR> with small pleural effusions, patchy consolidation or atelectasis in both lower lung fields and a large hiatal hernia. CTA was negative for PE.  Small to moderate right and small left layering pleural effusion and bibasilar atelectasis.   Medication administered in the ED: Ceftriaxone  and azithromycin  for suspected pneumonia, Lasix  20 mg IV Disposition: Admitted to [facility] service   See significant events below  Pertinent  Medical History  IDA, GERD, T2DM, anxiety and depression, glaucoma, HLD, hypothyroidism, seizure disorders, cerebral palsy, schizophreniform disorder and PVD   Significant Hospital Events: Including procedures, antibiotic start and stop dates in addition to other pertinent events   6/27:Admit to [facility] service with new onset CHF and CAP 6/28: Stable on 5L 6/29: Rapid response called for Acute hypoxic respiratory Failure and altered mental status. Transferred to ICU, patient unresponsive with agonal respirations and increased work of breathing. Intubated emergently for airway protection.   Interim  History / Subjective:  Patient is sedated and intubated.  She is negative 2L.   Objective    Blood pressure (!) 105/57, pulse 69, temperature 98.1 F (36.7 C), temperature source Axillary, resp. rate 18, height 5' 3 (1.6 m), weight 67.4 kg, SpO2 98%.    Vent Mode: PRVC FiO2 (%):  [40 %] 40 % Set Rate:  [18 bmp] 18 bmp Vt Set:  [400 mL] 400 mL PEEP:  [5 cmH20] 5 cmH20 Plateau Pressure:  [15 cmH20-16 cmH20] 15 cmH20   Intake/Output Summary (Last 24 hours) at 09/29/2023 0839 Last data filed at 09/29/2023 0650 Gross per 24 hour  Intake 2054.97 ml  Output 4500 ml  Net -2445.03 ml   Filed Weights   09/25/23 0703 09/29/23 0500  Weight: 82.2 kg 67.4 kg    Examination: General: Intubated, RASS-1,HENT: Supple neck, reactive pupils Lungs: Coarse breath sounds bilaterally Cardiovascular: Normal S1, Normal S2, RRR  Abdomen: Soft, non tender, non distended  Extremities: Edema to the thighs.   Labs and imaging were reviewed.   Assessment and Plan  68 year old female with multiple medical problems who presented to the hospital with acute hypoxic respiratory failure, with signs of decompensated heart failure. She was admitted to the floor and decompensated overnight, requiring admission to the ICU following intubation and mechanical ventilation.   #Acute Hypoxic Respiratory Failure #Right Pleural Effusion #Acute Decompensated Heart Failure - Echo w/ EF 35 to 40%, global hypokinesis, RV systolic function is moderately reduced. Severe PH with PASP 61.42mmHg. Unclear reason, CAD? Trops have been underwhelming.  #Sepsis with suspected aspiration pneumonia #Toxic Metabolic Encephalopathy #T2DM #Hypothyroidism #Schizophrenia   Neuro - on propofol  for sedation and fentanyl  for analgesia, goal  RASS 0 to-1 and CPOT < 2. SAT and moving towards extubation. Restart home antipsychotics once enteral access achieved.  CV - concern for decompensated heart failure, RV failure likely decomp in the setting  of pneumonia. CTA chest neg for PE. C/w IV furosemide  to 80mg  BID. On minimal vasopressor support secondary to sedation related hypotension. Appreciate cardiology input.  Pulm/ID - Sputum culture (No organism on gram stain), can de-escalate to unasyn . Moving towards extubation.  GI - Once extubated, we will assess swallow function.  Renal - kidney function at baseline, continue to trend Hem/Onc - on DVT prophylaxis with heparin  Endo - ICU glycemic protocol, levothyroxine   Best Practice (right click and Reselect all SmartList Selections daily)   Diet/type: NPO for now and moving towards extubation.  DVT prophylaxis prophylactic heparin   Pressure ulcer(s): N/A GI prophylaxis: PPI Lines: yes and it is still needed Foley:  Yes, and it is still needed Code Status:  full code  Last date of multidisciplinary goals of care discussion [09/29/2023]  Critical care time: 40 minutes    Darrin Barn, MD [facility] 09/29/2023 8:39 AM

## 2023-09-29 NOTE — Progress Notes (Signed)
 [facility] CLINIC CARDIOLOGY PROGRESS NOTE       Patient ID: IDELLA James MRN: [id number] DOB/AGE: 1955/07/08 68 y.o.  Admit date: 09/25/2023 Referring Physician Dr. Malka Primary Physician Glover Lenis, MD Primary Cardiologist None Reason for Consultation New onset CHF with rEF  HPI: Michele James is a 68 y.o. female  with a past medical history of type 2 diabetes mellitus, GERD, hypothyroidism, seizures, cerebral palsy, PVD who presented to the ED on 09/25/2023 for SOB, productive cough and bilateral lower extremity swelling. Patient admitted due to new onset CHF and CAP. On 06/29 rapid response called for acute hypoxic respiratory failure and AMS. Patient transferred to ICU and required intubation for airway protection. Cardiology was consulted for further evaluation of new onset HFrEF.  Interval History: -Patient seen and examined this AM. Patient more awake and interactive this AM. Remains intubated. -Patient weaning off on levo gtt (Currently on 1 mcg) with improving BP. HR remains stable. -Overnight Tele showed no significant events.  -Yesterday UOP 4.5L, with net negative of -2.4L.  With stable renal function. -Electrolytes stable this AM.    Review of systems complete and found to be negative unless listed above    Past Medical History:  Diagnosis Date   Anemia    Arthritis    knees   Cerebral palsy (HCC)    Depression    Diabetes mellitus without complication (HCC)    Diverticulosis    Dysphagia    Eczema    GERD (gastroesophageal reflux disease)    Glaucoma    Hemorrhoids    Hyperlipidemia    Hypothyroidism    Seizures (HCC)    none in over 10 yrs    Past Surgical History:  Procedure Laterality Date   BREAST BIOPSY Right 03/22/2020   stereo bx, ribbon clip,  FAT NECROSIS AND FIBROSIS   CATARACT EXTRACTION W/PHACO Right 02/06/2016   Procedure: CATARACT EXTRACTION PHACO AND INTRAOCULAR LENS PLACEMENT (IOC);  Surgeon: Dene Etienne, MD;  Location:  [facility];  Service: Ophthalmology;  Laterality: Right;   CATARACT EXTRACTION W/PHACO Left 03/05/2016   Procedure: CATARACT EXTRACTION PHACO AND INTRAOCULAR LENS PLACEMENT (IOC);  Surgeon: Dene Etienne, MD;  Location: [facility];  Service: Ophthalmology;  Laterality: Left;   CHOLECYSTECTOMY N/A 11/16/2015   Procedure: LAPAROSCOPIC CHOLECYSTECTOMY;  Surgeon: Elgin Laurence III, MD;  Location: [facility] ORS;  Service: General;  Laterality: N/A;  attempted cholangiogram   COLONOSCOPY WITH PROPOFOL  N/A 12/25/2014   Procedure: COLONOSCOPY WITH PROPOFOL ;  Surgeon: Deward CINDERELLA Piedmont, MD;  Location: [facility] ENDOSCOPY;  Service: Gastroenterology;  Laterality: N/A;   COLONOSCOPY WITH PROPOFOL  N/A 04/14/2019   Procedure: COLONOSCOPY WITH PROPOFOL ;  Surgeon: Toledo, Ladell POUR, MD;  Location: [facility] ENDOSCOPY;  Service: Gastroenterology;  Laterality: N/A;   COLONOSCOPY WITH PROPOFOL  N/A 04/20/2022   Procedure: COLONOSCOPY WITH PROPOFOL ;  Surgeon: Therisa Bi, MD;  Location: [facility] ENDOSCOPY;  Service: Gastroenterology;  Laterality: N/A;   ENDOSCOPIC RETROGRADE CHOLANGIOPANCREATOGRAPHY (ERCP) WITH PROPOFOL  N/A 11/18/2015   Procedure: ENDOSCOPIC RETROGRADE CHOLANGIOPANCREATOGRAPHY (ERCP) WITH PROPOFOL ;  Surgeon: Rogelia Copping, MD;  Location: [facility] ENDOSCOPY;  Service: Endoscopy;  Laterality: N/A;   ESOPHAGOGASTRODUODENOSCOPY (EGD) WITH PROPOFOL  N/A 04/14/2019   Procedure: ESOPHAGOGASTRODUODENOSCOPY (EGD) WITH PROPOFOL ;  Surgeon: Toledo, Ladell POUR, MD;  Location: [facility] ENDOSCOPY;  Service: Gastroenterology;  Laterality: N/A;   ESOPHAGOGASTRODUODENOSCOPY (EGD) WITH PROPOFOL  N/A 04/20/2022   Procedure: ESOPHAGOGASTRODUODENOSCOPY (EGD) WITH PROPOFOL ;  Surgeon: Therisa Bi, MD;  Location: [facility] ENDOSCOPY;  Service: Gastroenterology;  Laterality: N/A;   EYE SURGERY  Cataract Surgery    HERNIA REPAIR      Medications Prior to Admission  Medication Sig Dispense Refill Last Dose/Taking   acetaminophen  (TYLENOL ) 325 MG  tablet Take 325 mg by mouth every 6 (six) hours as needed.   Unknown   alendronate  (FOSAMAX ) 70 MG tablet Take 70 mg by mouth once a week. Take with a full glass of water on an empty stomach.   Past Week   ALPRAZolam  (XANAX ) 0.25 MG tablet Take 0.25 mg by mouth as needed for anxiety. Before pap smears   Unknown   atorvastatin  (LIPITOR) 40 MG tablet Take 40 mg by mouth daily.   09/24/2023   calcium  citrate-vitamin D  (CITRACAL+D) 315-200 MG-UNIT per tablet Take 1 tablet by mouth 2 (two) times daily.   09/24/2023   carbamazepine  (TEGRETOL ) 200 MG tablet Take 200 mg by mouth 3 (three) times daily.   09/24/2023   Cholecalciferol  100 MCG (4000 UT) TABS Take 1,000 Units by mouth.   09/24/2023   esomeprazole (NEXIUM) 40 MG capsule Take 40 mg by mouth daily at 12 noon.   09/24/2023   famotidine  (PEPCID ) 20 MG tablet Take 20 mg by mouth 2 (two) times daily.   09/24/2023   hydroxypropyl methylcellulose / hypromellose (ISOPTO TEARS / GONIOVISC) 2.5 % ophthalmic solution 1 drop as needed for dry eyes.   Unknown   iron  polysaccharides (NIFEREX) 150 MG capsule Take 1 capsule (150 mg total) by mouth 2 (two) times daily for 30 days, THEN 1 capsule (150 mg total) daily. 150 capsule 0 09/24/2023   levothyroxine  (SYNTHROID ) 100 MCG tablet Take 100 mcg by mouth every morning.   09/24/2023   magnesium  oxide (MAG-OX) 400 (240 Mg) MG tablet Take 1 tablet by mouth 3 (three) times daily.   09/24/2023   naproxen (NAPROSYN) 375 MG tablet Take 375 mg by mouth 2 (two) times daily with a meal.   09/24/2023   OLANZapine  (ZYPREXA ) 10 MG tablet Take 1 tablet (10 mg total) by mouth at bedtime. (Patient taking differently: Take 10 mg by mouth at bedtime. Take 10 mg in addition to 15 mg tablet for total 25 mg at bedtime) 90 tablet 0 09/24/2023   OLANZapine  (ZYPREXA ) 15 MG tablet Take 1 tablet (15 mg total) by mouth at bedtime. (Patient taking differently: Take 15 mg by mouth at bedtime. Take 15 mg in addition to one 10 mg tablet for total 25 mg at  bedtime) 90 tablet 0 09/24/2023   OLANZapine  (ZYPREXA ) 5 MG tablet Take 1 tablet (5 mg total) by mouth daily. 90 tablet 0 09/24/2023 Morning   PARoxetine  (PAXIL ) 20 MG tablet TAKE 1 TABLET BY MOUTH EVERY DAY 90 tablet 1 09/24/2023   potassium chloride  20 MEQ/15ML (10%) SOLN Take 10 mEq by mouth daily.   09/24/2023   vitamin E 400 UNIT capsule Take 400 Units by mouth daily.   09/24/2023   Social History   Socioeconomic History   Marital status: Single    Spouse name: Not on file   Number of children: Not on file   Years of education: Not on file   Highest education level: Not on file  Occupational History   Not on file  Tobacco Use   Smoking status: Former    Current packs/day: 0.00    Types: Cigarettes    Quit date: 06/20/1980    Years since quitting: 43.3   Smokeless tobacco: Never   Tobacco comments:    smoked socially in early 1980s (reports for 4 months)  Vaping Use   Vaping status: Never Used  Substance and Sexual Activity   Alcohol  use: No   Drug use: No   Sexual activity: Not Currently  Other Topics Concern   Not on file  Social History Narrative   Not on file   Social Drivers of Health   Financial Resource Strain: Low Risk  (09/28/2023)   Overall Financial Resource Strain (CARDIA)    Difficulty of Paying Living Expenses: Not hard at all  Food Insecurity: No Food Insecurity (09/28/2023)   Hunger Vital Sign    Worried About Running Out of Food in the Last Year: Never true    Ran Out of Food in the Last Year: Never true  Transportation Needs: No Transportation Needs (09/28/2023)   PRAPARE - Administrator, Civil Service (Medical): No    Lack of Transportation (Non-Medical): No  Physical Activity: Not on file  Stress: Not on file  Social Connections: Unknown (09/26/2023)   Social Connection and Isolation Panel    Frequency of Communication with Friends and Family: Patient unable to answer    Frequency of Social Gatherings with Friends and Family: Patient  unable to answer    Attends Religious Services: Patient unable to answer    Active Member of Clubs or Organizations: Patient unable to answer    Attends Banker Meetings: Patient unable to answer    Marital Status: Never married  Intimate Partner Violence: Patient Unable To Answer (09/26/2023)   Humiliation, Afraid, Rape, and Kick questionnaire    Fear of Current or Ex-Partner: Patient unable to answer    Emotionally Abused: Patient unable to answer    Physically Abused: Patient unable to answer    Sexually Abused: Patient unable to answer    Family History  Problem Relation Age of Onset   High blood pressure Mother    Hyperlipidemia Mother    Diabetes Mellitus II Mother    Stroke Mother    Osteoporosis Mother    High blood pressure Father    Breast cancer Maternal Aunt    Stroke Maternal Grandfather    Diabetes Mellitus II Maternal Grandfather    Stroke Maternal Grandmother    Diabetes Mellitus II Maternal Grandmother      Vitals:   09/29/23 0615 09/29/23 0630 09/29/23 0645 09/29/23 0700  BP:      Pulse: 66 69 63 69  Resp: 18 14 18 18   Temp:      TempSrc:      SpO2: 99% 98% 99% 97%  Weight:      Height:        PHYSICAL EXAM General: Chronically ill-appearing elderly female, well nourished, in no acute distress. HEENT: Normocephalic and atraumatic. Neck: No JVD.   Lungs: Intubated and sedated.  Mechanical breath sounds bilaterally. Heart: HRRR. Normal S1 and S2 without gallops or murmurs.  Abdomen: Non-distended appearing.  Msk: Normal strength and tone for age. Extremities: Warm and well perfused. No clubbing, cyanosis.  1+ pedal edema.  Labs: Basic Metabolic Panel: Recent Labs    09/28/23 0406 09/28/23 1945 09/29/23 0447  NA 139  --  139  K 3.0* 2.7* 3.8  CL 98  --  99  CO2 30  --  30  GLUCOSE 119*  --  108*  BUN 18  --  11  CREATININE 0.46  --  0.39*  CALCIUM  7.9*  --  7.8*  MG 1.7 1.5* 2.1  PHOS  --  3.2 2.8   Liver Function  Tests: Recent Labs    09/27/23 0213  AST 24  ALT 27  ALKPHOS 79  BILITOT 0.8  PROT 5.9*  ALBUMIN 3.1*   No results for input(s): LIPASE, AMYLASE in the last 72 hours. CBC: Recent Labs    09/28/23 0406 09/29/23 0447  WBC 7.2 7.2  HGB 11.6* 11.3*  HCT 35.0* 34.7*  MCV 95.1 97.5  PLT 283 251   Cardiac Enzymes: Recent Labs    09/27/23 0213 09/27/23 1708  TROPONINIHS 27* 34*   BNP: Recent Labs    09/27/23 0213  BNP 962.0*   D-Dimer: No results for input(s): DDIMER in the last 72 hours. Hemoglobin A1C: No results for input(s): HGBA1C in the last 72 hours.  Fasting Lipid Panel: Recent Labs    09/28/23 0406  TRIG 102   Thyroid  Function Tests: No results for input(s): TSH, T4TOTAL, T3FREE, THYROIDAB in the last 72 hours.  Invalid input(s): FREET3 Anemia Panel: No results for input(s): VITAMINB12, FOLATE, FERRITIN, TIBC, IRON , RETICCTPCT in the last 72 hours.   Radiology: ECHOCARDIOGRAM COMPLETE Result Date: 09/27/2023    ECHOCARDIOGRAM REPORT   Patient Name:   LAFREDA CASEBEER Date of Exam: 09/27/2023 Medical Rec #:  [id number]      Height:       63.0 in Accession #:    [id number]     Weight:       181.2 lb Date of Birth:  Jul 31, 1955      BSA:          1.854 m Patient Age:    68 years       BP:           104/56 mmHg Patient Gender: F              HR:           62 bpm. Exam Location:  [facility] Procedure: 2D Echo, Color Doppler, Cardiac Doppler and Strain Analysis (Both            Spectral and Color Flow Doppler were utilized during procedure). Indications:     I50.31 Acute Diastolic CHF  History:         Patient has prior history of Echocardiogram examinations. Risk                  Factors:Hypertension and Dyslipidemia.  Sonographer:     L. Thornton-Maynard Referring Phys:  [id number] CAMILA A THOMAS Diagnosing Phys: Redell Cave MD  Sonographer Comments: Echo performed with patient supine and on artificial respirator. Global longitudinal  strain was attempted. IMPRESSIONS  1. Left ventricular ejection fraction, by estimation, is 35 to 40%. The left ventricle has moderately decreased function. The left ventricle demonstrates global hypokinesis. Left ventricular diastolic parameters are consistent with Grade I diastolic dysfunction (impaired relaxation). There is the interventricular septum is flattened in systole and diastole, consistent with right ventricular pressure and volume overload.  2. Right ventricular systolic function is moderately reduced. The right ventricular size is mildly enlarged. There is severely elevated pulmonary artery systolic pressure. The estimated right ventricular systolic pressure is 61.1 mmHg.  3. Right atrial size was severely dilated.  4. The mitral valve is normal in structure. No evidence of mitral valve regurgitation.  5. The aortic valve is tricuspid. Aortic valve regurgitation is mild. Aortic valve sclerosis is present, with no evidence of aortic valve stenosis. FINDINGS  Left Ventricle: Left ventricular ejection fraction, by estimation, is 35 to 40%. The left ventricle has moderately decreased function. The left ventricle  demonstrates global hypokinesis. The left ventricular internal cavity size was normal in size. There is no left ventricular hypertrophy. The interventricular septum is flattened in systole and diastole, consistent with right ventricular pressure and volume overload. Left ventricular diastolic parameters are consistent with Grade I diastolic dysfunction (impaired relaxation). Right Ventricle: The right ventricular size is mildly enlarged. No increase in right ventricular wall thickness. Right ventricular systolic function is moderately reduced. There is severely elevated pulmonary artery systolic pressure. The tricuspid regurgitant velocity is 3.81 m/s, and with an assumed right atrial pressure of 3 mmHg, the estimated right ventricular systolic pressure is 61.1 mmHg. Left Atrium: Left atrial size  was normal in size. Right Atrium: Right atrial size was severely dilated. Pericardium: There is no evidence of pericardial effusion. Mitral Valve: The mitral valve is normal in structure. No evidence of mitral valve regurgitation. MV peak gradient, 2.9 mmHg. The mean mitral valve gradient is 1.0 mmHg. Tricuspid Valve: The tricuspid valve is normal in structure. Tricuspid valve regurgitation is mild. Aortic Valve: The aortic valve is tricuspid. Aortic valve regurgitation is mild. Aortic valve sclerosis is present, with no evidence of aortic valve stenosis. Aortic valve mean gradient measures 2.0 mmHg. Aortic valve peak gradient measures 3.3 mmHg. Aortic valve area, by VTI measures 2.43 cm. Pulmonic Valve: The pulmonic valve was grossly normal. Pulmonic valve regurgitation is not visualized. Aorta: The aortic root and ascending aorta are structurally normal, with no evidence of dilitation. Venous: IVC assessment for right atrial pressure unable to be performed due to mechanical ventilation. IAS/Shunts: No atrial level shunt detected by color flow Doppler.  LEFT VENTRICLE PLAX 2D LVIDd:         3.70 cm     Diastology LVIDs:         2.90 cm     LV e' medial:    5.70 cm/s LV PW:         0.90 cm     LV E/e' medial:  7.6 LV IVS:        0.90 cm     LV e' lateral:   6.83 cm/s LVOT diam:     1.90 cm     LV E/e' lateral: 6.4 LV SV:         45 LV SV Index:   24 LVOT Area:     2.84 cm  LV Volumes (MOD) LV vol d, MOD A2C: 33.5 ml LV vol d, MOD A4C: 35.8 ml LV vol s, MOD A2C: 17.4 ml LV vol s, MOD A4C: 11.8 ml LV SV MOD A2C:     16.1 ml LV SV MOD A4C:     35.8 ml LV SV MOD BP:      20.5 ml RIGHT VENTRICLE            IVC RV Basal diam:  4.40 cm    IVC diam: 2.10 cm RV Mid diam:    2.90 cm RV S prime:     8.15 cm/s TAPSE (M-mode): 0.7 cm LEFT ATRIUM             Index        RIGHT ATRIUM           Index LA diam:        3.30 cm 1.78 cm/m   RA Area:     24.80 cm LA Vol (A2C):   32.5 ml 17.53 ml/m  RA Volume:   97.20 ml  52.42  ml/m LA Vol (A4C):   26.3 ml 14.18 ml/m  LA Biplane Vol: 30.2 ml 16.29 ml/m  AORTIC VALVE                    PULMONIC VALVE AV Area (Vmax):    2.58 cm     PV Vmax:       0.62 m/s AV Area (Vmean):   2.21 cm     PV Peak grad:  1.6 mmHg AV Area (VTI):     2.43 cm AV Vmax:           90.85 cm/s AV Vmean:          63.650 cm/s AV VTI:            0.184 m AV Peak Grad:      3.3 mmHg AV Mean Grad:      2.0 mmHg LVOT Vmax:         82.70 cm/s LVOT Vmean:        49.600 cm/s LVOT VTI:          0.158 m LVOT/AV VTI ratio: 0.86  AORTA Ao Root diam: 3.30 cm Ao Asc diam:  3.20 cm MITRAL VALVE               TRICUSPID VALVE MV Area (PHT): 2.87 cm    TR Peak grad:   58.1 mmHg MV Area VTI:   2.02 cm    TR Vmax:        381.00 cm/s MV Peak grad:  2.9 mmHg MV Mean grad:  1.0 mmHg    SHUNTS MV Vmax:       0.85 m/s    Systemic VTI:  0.16 m MV Vmean:      42.8 cm/s   Systemic Diam: 1.90 cm MV Decel Time: 264 msec MV E velocity: 43.50 cm/s MV A velocity: 75.50 cm/s MV E/A ratio:  0.58 Redell Cave MD Electronically signed by Redell Cave MD Signature Date/Time: 09/27/2023/7:23:05 PM    Final    DG Abd 1 View Result Date: 09/27/2023 CLINICAL DATA:  [id number] Encounter for imaging study to confirm orogastric (OG) tube placement [id number] EXAM: ABDOMEN - 1 VIEW COMPARISON:  Earlier film of the same day, CT 09/22/2023 FINDINGS: Gastric tube overlies the medial left infrahilar chest and left upper abdomen corresponding to hiatal hernia seen on prior CT. The stomach is nondistended. visualized small bowel and colon normal. Lumbar levoscoliosis with multilevel spondylitic change. IMPRESSION: Gastric tube extends to the level of hiatal hernia. Electronically Signed   By: JONETTA Faes M.D.   On: 09/27/2023 10:30   DG Abd 1 View Result Date: 09/27/2023 CLINICAL DATA:  [id number]. Encounter for study to confirm nasogastric tube placement. EXAM: ABDOMEN - 1 VIEW COMPARISON:  PET-CT 07/02/2020, abdomen x-rays 10/21/2022 FINDINGS: There is A  moderate sized hiatal hernia. NGT passes into the stomach below the diaphragm and is probably in the distal body of stomach. The bowel pattern is nonobstructive with moderate retained stool. The pelvis was excluded from the exam. There is no semi supine evidence of free air. No other significant radiographic findings. Advanced degenerative change lumbar spine. IMPRESSION: 1. NGT passes into the stomach below the diaphragm and is probably in the distal body of stomach. 2. Moderate sized hiatal hernia. 3. Nonobstructive bowel pattern with moderate retained stool. Electronically Signed   By: Francis Quam M.D.   On: 09/27/2023 05:11   Portable Chest x-ray Result Date: 09/27/2023 CLINICAL DATA:  Central line placement EXAM: PORTABLE CHEST 1 VIEW COMPARISON:  Same day chest radiograph  FINDINGS: Endotracheal tube tip in the mid intrathoracic trachea. Left IJ CVC tip in the right atrium. No pneumothorax. Similar patchy bilateral airspace opacities and layering pleural effusions. Stable cardiomediastinal silhouette. Aortic atherosclerotic calcification. IMPRESSION: 1. Left IJ CVC tip in the right atrium. No pneumothorax. 2. Similar patchy bilateral airspace opacities and layering pleural effusions. Electronically Signed   By: Norman Gatlin M.D.   On: 09/27/2023 02:10   DG Chest Port 1 View Result Date: 09/27/2023 CLINICAL DATA:  Endotracheal tube placement EXAM: PORTABLE CHEST 1 VIEW COMPARISON:  09/25/2023 FINDINGS: Endotracheal tube tip in the mid intrathoracic trachea. Low lung volumes. Stable cardiomediastinal silhouette. Aortic atherosclerotic calcification. Diffuse bronchial wall thickening compatible with bronchitis. Small bilateral pleural effusions and basilar airspace opacities. No pneumothorax. No displaced rib fracture. Remote left rib fractures. IMPRESSION: 1. Endotracheal tube tip in the mid intrathoracic trachea. 2. Small bilateral pleural effusions and basilar airspace opacities, atelectasis versus  pneumonia. 3. Bronchitis. Electronically Signed   By: Norman Gatlin M.D.   On: 09/27/2023 00:51   CT Angio Chest PE W/Cm &/Or Wo Cm Result Date: 09/25/2023 CLINICAL DATA:  Pulmonary embolism (PE) suspected, high prob, dyspnea EXAM: CT ANGIOGRAPHY CHEST WITH CONTRAST TECHNIQUE: Multidetector CT imaging of the chest was performed using the standard protocol during bolus administration of intravenous contrast. Multiplanar CT image reconstructions and MIPs were obtained to evaluate the vascular anatomy. RADIATION DOSE REDUCTION: This exam was performed according to the departmental dose-optimization program which includes automated exposure control, adjustment of the mA and/or kV according to patient size and/or use of iterative reconstruction technique. CONTRAST:  75mL OMNIPAQUE  IOHEXOL  350 MG/ML SOLN COMPARISON:  Chest radiograph from earlier today. 10/18/2022 chest CT angiogram. FINDINGS: Cardiovascular: Streaky indistinct areas of low-attenuation throughout the main, right and left pulmonary arteries, favor mixing artifact. No convincing filling defects in the pulmonary arteries to suggest pulmonary embolism. Atherosclerotic nonaneurysmal thoracic aorta. Normal caliber pulmonary arteries. Top-normal heart size. No significant pericardial fluid/thickening. Mediastinum/Nodes: No significant thyroid  nodules. Unremarkable esophagus. No pathologically enlarged axillary, mediastinal or hilar lymph nodes. Lungs/Pleura: No pneumothorax. Small to moderate right and small left layering pleural effusions. Moderate dependent bibasilar atelectasis. Mosaic attenuation throughout both lungs. Indistinct tiny 0.3 cm posterior right upper lobe solid pulmonary nodule on series 5/image 69, stable. No new significant pulmonary nodules in the aerated portions of the lungs. No acute consolidative airspace disease. Upper abdomen: No acute abnormality. Musculoskeletal: No aggressive appearing focal osseous lesions. Marked thoracic  spondylosis. Review of the MIP images confirms the above findings. IMPRESSION: 1. No convincing evidence of pulmonary embolism. Streaky indistinct areas of low-attenuation throughout the main, right and left pulmonary arteries, favor mixing artifact. 2. Small to moderate right and small left layering pleural effusions. Moderate dependent bibasilar atelectasis. 3. Mosaic attenuation throughout both lungs, as can be seen with air trapping from small airways disease versus mosaic perfusion from pulmonary vascular disease. 4.  Aortic Atherosclerosis (ICD10-I70.0). Electronically Signed   By: Selinda DELENA Blue M.D.   On: 09/25/2023 10:54   DG Chest 1 View Result Date: 09/25/2023 CLINICAL DATA:  Shortness of breath and edema. EXAM: CHEST  1 VIEW COMPARISON:  AP Lat chest 02/06/2023 FINDINGS: Low inspiration on exam compared to the prior study. There are small pleural effusions. There is patchy consolidation or atelectasis in both lower lung fields. Superimposed chronic elevation right hemidiaphragm. There is a large hiatal hernia with stable mediastinum. Mild cardiomegaly without vascular prominence. There is aortic atherosclerosis. Degenerative change thoracic spine. No new osseous findings. IMPRESSION: 1.  Low inspiration. 2. Small pleural effusions. 3. Patchy consolidation or atelectasis in both lower lung fields. 4. Large hiatal hernia. 5. Aortic atherosclerosis. Electronically Signed   By: Francis Quam M.D.   On: 09/25/2023 07:53   CT ANGIO AO+BIFEM W & OR WO CONTRAST Result Date: 09/23/2023 CLINICAL DATA:  Lower extremity claudication with soft tissue ulceration EXAM: CT ANGIOGRAPHY OF ABDOMINAL AORTA WITH ILIOFEMORAL RUNOFF TECHNIQUE: Multidetector CT imaging of the abdomen, pelvis and lower extremities was performed using the standard protocol during bolus administration of intravenous contrast. Multiplanar CT image reconstructions and MIPs were obtained to evaluate the vascular anatomy. RADIATION DOSE  REDUCTION: This exam was performed according to the departmental dose-optimization program which includes automated exposure control, adjustment of the mA and/or kV according to patient size and/or use of iterative reconstruction technique. CONTRAST:  OMNIPAQUE  IOHEXOL  350 MG/ML SOLN COMPARISON:  Prior PET-CT 07/02/2020 FINDINGS: VASCULAR Aorta: Normal caliber aorta without aneurysm, dissection, vasculitis or significant stenosis. Celiac: Patent without evidence of aneurysm, dissection, vasculitis or significant stenosis. SMA: Patent without evidence of aneurysm, dissection, vasculitis or significant stenosis. Renals: Both main renal arteries are patent without evidence of aneurysm, dissection, vasculitis, fibromuscular dysplasia or significant stenosis. Small accessory artery to the right kidney. IMA: Patent without evidence of aneurysm, dissection, vasculitis or significant stenosis. RIGHT Lower Extremity Inflow: Common, internal and external iliac arteries are patent without evidence of aneurysm, dissection, vasculitis or significant stenosis. Outflow: Common, superficial and profunda femoral arteries and the popliteal artery are patent without evidence of aneurysm, dissection, vasculitis or significant stenosis. Runoff: Patent three vessel runoff to the ankle. LEFT Lower Extremity Inflow: Common, internal and external iliac arteries are patent without evidence of aneurysm, dissection, vasculitis or significant stenosis. Outflow: Common, superficial and profunda femoral arteries and the popliteal artery are patent without evidence of aneurysm, dissection, vasculitis or significant stenosis. Runoff: Patent three vessel runoff to the ankle. Veins: No focal venous abnormality. Review of the MIP images confirms the above findings. NON-VASCULAR Lower chest: Marked enlargement of the main pulmonary artery at 3.2 cm suggesting pulmonary arterial hypertension. Cardiomegaly with right heart enlargement. No  pericardial effusion. Moderate right and trace left pleural effusions. Large sliding hiatal hernia. Hepatobiliary: No focal liver abnormality is seen. Status post cholecystectomy. No biliary dilatation. Pancreas: Unremarkable. No pancreatic ductal dilatation or surrounding inflammatory changes. Spleen: Normal in size without focal abnormality. Adrenals/Urinary Tract: Adrenal glands are unremarkable. Kidneys are normal, without renal calculi, focal lesion, or hydronephrosis. Bladder is unremarkable. Stomach/Bowel: Large volume of formed stool in the rectum with a rectal stool ball measuring at least 9 x 7 x 6 cm. There is mild rectal wall thickening. No evidence of bowel obstruction. Normal appendix. Abnormal thickening of a distal loop of ileum in the low anatomic pelvis immediately adjacent to the large abscess collection. Lymphatic: No suspicious lymphadenopathy. Reproductive: Apparent postsurgical changes of hysterectomy. The uterus is no longer visualized although it was present on prior PET imaging from April of 2022. Other: Thick walled fluid and gas collection in the recto vesicular recess measures approximately 6.8 x 4.4 x 4.4 cm. Diffuse diastasis recti. Small fat containing ventral abdominal hernia. 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 Inward rotation of the left lower extremity with severe degenerative arthritis. Marked reticulation of the subcutaneous fat affecting the body wall and bilateral lower extremities consistent with edema. IMPRESSION: VASCULAR 1. No evidence of hemodynamically significant arterial stenosis or occlusion. No evidence of significant peripheral arterial disease. 2. Trace aortic atherosclerotic  vascular calcifications. Aortic Atherosclerosis (ICD10-I70.0). NON-VASCULAR 1. Thick walled fluid and gas collection in the recto vesicular recess within the pelvic cul-de-sac consistent with an abscess measuring 6.8 x 4.4 x 4.4 cm. There is an  associated adjacent thick walled loop of small bowel which may represent chronic secondary inflammatory reaction if the source of the abscess is related to a prior hysterectomy, a source of chronic small bowel perforation, or potentially even lymphomatous involvement of the small bowel with contained perforation. 2. Extensive anasarca and bilateral lower extremity edema. Findings are favored to be related to elevated right heart pressures in the setting of pulmonary arterial hypertension. 3. Moderate right and trace left pleural effusions. 4. Moderate to large sliding hiatal hernia. 5. 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 6. Inward rotation of the left lower extremity with advanced knee joint degenerative osteoarthritis. These results will be called to the ordering clinician or representative by the Radiologist Assistant, and communication documented in the PACS or [employer]. Electronically Signed   By: Wilkie Lent M.D.   On: 09/23/2023 09:04   VAS US  LOWER EXTREMITY ARTERIAL DUPLEX Result Date: 09/07/2023 LOWER EXTREMITY ARTERIAL DUPLEX STUDY Patient Name:  TANEAL SONNTAG  Date of Exam:   09/07/2023 Medical Rec #: [id number]       Accession #:    [id number] Date of Birth: 07-29-1955       Patient Gender: F Patient Age:   71 years Exam Location:  [facility] Procedure:      VAS US  LOWER EXTREMITY ARTERIAL DUPLEX Referring Phys: CORDELLA SHAWL --------------------------------------------------------------------------------  Indications: Ulceration.  Current ABI: not done Limitations: Patient is unable to stand; exam was performed with patient upright              in a wheelchair Comparison Study: ABI on 06/29/23: Right=1.27(TBI=0.97) & Left=1.28(TBI=0.93) Performing Technologist: [name] Churn RT, RDMS, RVT  Examination Guidelines: A complete evaluation includes B-mode imaging, spectral Doppler, color Doppler, and power Doppler as needed of all accessible  portions of each vessel. Bilateral testing is considered an integral part of a complete examination. Limited examinations for reoccurring indications may be performed as noted.  +-----------+--------+-----+--------+---------+-------------+ RIGHT      PSV cm/sRatioStenosisWaveform Comments      +-----------+--------+-----+--------+---------+-------------+ CFA Mid                                  not evaluated +-----------+--------+-----+--------+---------+-------------+ SFA Prox                                 not evaluated +-----------+--------+-----+--------+---------+-------------+ SFA Mid    77                   biphasic               +-----------+--------+-----+--------+---------+-------------+ SFA Distal 73                   biphasic               +-----------+--------+-----+--------+---------+-------------+ POP Prox   70                   biphasic               +-----------+--------+-----+--------+---------+-------------+ POP Distal 64  triphasic              +-----------+--------+-----+--------+---------+-------------+ ATA Distal 36                   biphasic               +-----------+--------+-----+--------+---------+-------------+ PTA Distal 62                   triphasic              +-----------+--------+-----+--------+---------+-------------+ PERO Distal30                   biphasic               +-----------+--------+-----+--------+---------+-------------+ Limited evaluation of the right lower extremity due to limited patient mobility.  +--------------+-------+-----------+--------+--------+-----+--------+ Left PoplitealAP (cm)Transv (cm)WaveformStenosisShapeComments +--------------+-------+-----------+--------+--------+-----+--------+ Distal                                                        +--------------+-------+-----------+--------+--------+-----+--------+  Summary: Right: Patent SFA,  popliteal and tibial arteries without evidence of significant stenosis. No evidence of significant arterial occlusive disease in the proximal vessels, based on Doppler waveforms and previous ABI.  See table(s) above for measurements and observations. Electronically signed by Cordella Shawl MD on 09/07/2023 at 5:25:09 PM.   Final     ECHO as above  TELEMETRY reviewed by me 09/29/2023: Sinus rhythm, rate 60s  EKG reviewed by me: sinus tachycardia, rate 122 bpm with nonspecific ST-T wave changes.  Data reviewed by me 09/29/2023: last 24h vitals tele labs imaging I/O hospitalist progress notes, critical care notes.   Principal Problem:   Acute hypoxic respiratory failure (HCC) Active Problems:   Anxiety and depression   Chronic GERD   Hypothyroidism   Seizures (HCC)   Anemia   Diabetes mellitus without complication (HCC)   Pneumonia of both lungs due to infectious organism   UTI (urinary tract infection)   CHF exacerbation (HCC)   PVD (peripheral vascular disease) (HCC)   History of cerebral palsy    ASSESSMENT AND PLAN:  Michele James is a 68 y.o. female  with a past medical history of type 2 diabetes mellitus, GERD, hypothyroidism, seizures, cerebral palsy, PVD who presented to the ED on 09/25/2023 for SOB, productive cough and bilateral lower extremity swelling. Patient admitted due to new onset CHF and CAP. On 06/29 rapid response called for acute hypoxic respiratory failure and AMS. Patient transferred to ICU and required intubation for airway protection. Cardiology was consulted for further evaluation of new onset HFrEF.  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 Patient intubated and sedated. Trops minimally elevated and flat 12 > 13 > 27 > 34. BNP elevated but  improving 1400 > 960. EKG on 06/29 with sinus tachycardia, RVH,  rate 122 bpm, without acute ischemic changes.  CTA with no evidence of acute pulmonary embolism.  Echo this admission with new findings of reduced EF(35-40%), moderate to severe RV systolic dysfunction, severely elevated PASP.  Prior echo on 09/2022 with preserved EF, no evidence of RV dysfunction or pulmonary hypertension. Hemodynamics improving on minimal pressor support (currently on 1mcg  of levo gtt).  -Continue IV lasix  to 80 mg BID. Closely monitor renal function and UOP.  -Continue levo infusion. Wean when able. Recommend goal MAP > 65. -Continue home atorvastatin  40 mg daily per tube.  -Plan to optimize GDMT when patient off pressor and hemodynamically stable.  -Minimally elevated flat troponins in the setting of acute hypoxic respiratory failure, new onset moderate to severe RV dysfunction and acute HFrEF is most consistent with demand/supply ischemia and not ACS. -Plan to repeat echo after clinical status improves, likely do outpatient RHC for further evaluation of new echo findings this admission. -Consider outpatient ischemic evaluation. -Acute hypoxic respiratory failure, sepsis with suspected aspiration pneumonia management per critical care team.   This patient's plan of care was discussed and created with Dr. Wilburn and he is in agreement.  Signed: Dorene Comfort, PA-C  09/29/2023, 7:07 AM [facility] Cardiology

## 2023-09-29 NOTE — Plan of Care (Signed)
  Problem: Coping: Goal: Ability to adjust to condition or change in health will improve Outcome: Progressing   Problem: Fluid Volume: Goal: Ability to maintain a balanced intake and output will improve Outcome: Progressing   Problem: Skin Integrity: Goal: Risk for impaired skin integrity will decrease Outcome: Progressing   Problem: Clinical Measurements: Goal: Diagnostic test results will improve Outcome: Progressing Goal: Respiratory complications will improve Outcome: Progressing Goal: Cardiovascular complication will be avoided Outcome: Progressing

## 2023-09-29 NOTE — Progress Notes (Signed)
 Nutrition Follow Up Note   DOCUMENTATION CODES:   Not applicable  INTERVENTION:   Ensure Plus High Protein po TID with diet advancement, each supplement provides 350 kcal and 20 grams of protein  MVI po daily with diet advancement   Pt at high refeed risk; recommend monitor potassium, magnesium  and phosphorus labs daily until stable  Daily weights   NUTRITION DIAGNOSIS:   Inadequate oral intake related to inability to eat (pt sedated and ventilated) as evidenced by estimated needs. 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  Pt extubated today. RD will add supplements with diet advancement. Pt is at high refeed risk. Per chart, pt appears to be around her UBW currently. Pt -5.2L on her I & Os.   Medications reviewed and include: lasix , heparin , insulin, iron , synthroid , protonix , unasyn , 5% dextrose  @40ml /hr, levophed  Labs reviewed: K 3.8 wnl, P 2.8 wnl, Mg 2.1 wnl Cbgs- 93, 93, 88 x 24 hrs  UOP-   Diet Order:   Diet Order             Diet NPO time specified  Diet effective now                  EDUCATION NEEDS:   No education needs have been identified at this time  Skin:  Skin Assessment: Reviewed RN Assessment  Last BM:  6/30- type 5  Height:   Ht Readings from Last 1 Encounters:  09/27/23 5' 3 (1.6 m)    Weight:   Wt Readings from Last 1 Encounters:  09/29/23 67.4 kg    Ideal Body Weight:  52.3 kg  BMI:  Body mass index is 26.32  kg/m.  Estimated Nutritional Needs:   Kcal:  1600-1800kcal/day  Protein:  80-90g/day  Fluid:  1.4-1.6L/day  Augustin Shams MS, RD, LDN If unable to be reached, please send secure chat to RD inpatient available from 8:00a-4:00p daily

## 2023-09-29 NOTE — Procedures (Signed)
 Extubation Procedure Note  Patient Details:   Name: Michele James DOB: [birth year] MRN: [id number]   Airway Documentation:    Vent end date: 09/29/23 Vent end time: 1020   Evaluation  O2 sats: stable throughout Complications: No apparent complications Patient did tolerate procedure well. Bilateral Breath Sounds: Clear   Yes able to cough and speak  [name] [name] 09/29/2023, 10:23 AM

## 2023-09-29 NOTE — Plan of Care (Signed)
   Problem: Education: Goal: Ability to describe self-care measures that may prevent or decrease complications (Diabetes Survival Skills Education) will improve Outcome: Progressing Goal: Individualized Educational Video(s) Outcome: Progressing   Problem: Coping: Goal: Ability to adjust to condition or change in health will improve Outcome: Progressing   Problem: Fluid Volume: Goal: Ability to maintain a balanced intake and output will improve Outcome: Progressing   Problem: Health Behavior/Discharge Planning: Goal: Ability to identify and utilize available resources and services will improve Outcome: Progressing Goal: Ability to manage health-related needs will improve Outcome: Progressing   Problem: Metabolic: Goal: Ability to maintain appropriate glucose levels will improve Outcome: Progressing   Problem: Nutritional: Goal: Maintenance of adequate nutrition will improve Outcome: Progressing Goal: Progress toward achieving an optimal weight will improve Outcome: Progressing   Problem: Skin Integrity: Goal: Risk for impaired skin integrity will decrease Outcome: Progressing   Problem: Tissue Perfusion: Goal: Adequacy of tissue perfusion will improve Outcome: Progressing   Problem: Education: Goal: Knowledge of General Education information will improve Description: Including pain rating scale, medication(s)/side effects and non-pharmacologic comfort measures Outcome: Progressing   Problem: Health Behavior/Discharge Planning: Goal: Ability to manage health-related needs will improve Outcome: Progressing   Problem: Clinical Measurements: Goal: Ability to maintain clinical measurements within normal limits will improve Outcome: Progressing Goal: Will remain free from infection Outcome: Progressing Goal: Diagnostic test results will improve Outcome: Progressing Goal: Respiratory complications will improve Outcome: Progressing Goal: Cardiovascular complication will  be avoided Outcome: Progressing   Problem: Activity: Goal: Risk for activity intolerance will decrease Outcome: Progressing   Problem: Nutrition: Goal: Adequate nutrition will be maintained Outcome: Progressing   Problem: Coping: Goal: Level of anxiety will decrease Outcome: Progressing   Problem: Elimination: Goal: Will not experience complications related to bowel motility Outcome: Progressing Goal: Will not experience complications related to urinary retention Outcome: Progressing   Problem: Pain Managment: Goal: General experience of comfort will improve and/or be controlled Outcome: Progressing   Problem: Safety: Goal: Ability to remain free from injury will improve Outcome: Progressing   Problem: Skin Integrity: Goal: Risk for impaired skin integrity will decrease Outcome: Progressing   Problem: Education: Goal: Ability to demonstrate management of disease process will improve Outcome: Progressing Goal: Ability to verbalize understanding of medication therapies will improve Outcome: Progressing Goal: Individualized Educational Video(s) Outcome: Progressing   Problem: Activity: Goal: Capacity to carry out activities will improve Outcome: Progressing   Problem: Cardiac: Goal: Ability to achieve and maintain adequate cardiopulmonary perfusion will improve Outcome: Progressing   Problem: Activity: Goal: Ability to tolerate increased activity will improve Outcome: Progressing   Problem: Clinical Measurements: Goal: Ability to maintain a body temperature in the normal range will improve Outcome: Progressing   Problem: Respiratory: Goal: Ability to maintain adequate ventilation will improve Outcome: Progressing Goal: Ability to maintain a clear airway will improve Outcome: Progressing

## 2023-09-30 ENCOUNTER — Inpatient Hospital Stay

## 2023-09-30 DIAGNOSIS — J90 Pleural effusion, not elsewhere classified: Secondary | ICD-10-CM | POA: Diagnosis not present

## 2023-09-30 DIAGNOSIS — J9601 Acute respiratory failure with hypoxia: Secondary | ICD-10-CM | POA: Diagnosis not present

## 2023-09-30 LAB — GLUCOSE, CAPILLARY
Glucose-Capillary: 95 mg/dL (ref 70–99)
Glucose-Capillary: 81 mg/dL (ref 70–99)
Glucose-Capillary: 93 mg/dL (ref 70–99)
Glucose-Capillary: 141 mg/dL — ABNORMAL HIGH (ref 70–99)
Glucose-Capillary: 131 mg/dL — ABNORMAL HIGH (ref 70–99)

## 2023-09-30 LAB — CULTURE, BLOOD (ROUTINE X 2): Culture: NO GROWTH

## 2023-09-30 LAB — MAGNESIUM
Magnesium: 1.8 mg/dL (ref 1.7–2.4)
Magnesium: 1.8 mg/dL (ref 1.7–2.4)

## 2023-09-30 LAB — BASIC METABOLIC PANEL WITH GFR
Anion gap: 7 (ref 5–15)
BUN: 11 mg/dL (ref 8–23)
CO2: 32 mmol/L (ref 22–32)
Calcium: 7.9 mg/dL — ABNORMAL LOW (ref 8.9–10.3)
Chloride: 100 mmol/L (ref 98–111)
Creatinine, Ser: 0.33 mg/dL — ABNORMAL LOW (ref 0.44–1.00)
GFR, Estimated: >60 mL/min (ref >60)
Glucose, Bld: 89 mg/dL (ref 70–99)
Potassium: 3.8 mmol/L (ref 3.5–5.1)
Sodium: 137 mmol/L (ref 135–145)

## 2023-09-30 LAB — CBC
HCT: 33.6 % — ABNORMAL LOW (ref 36.0–46.0)
Hemoglobin: 10.6 g/dL — ABNORMAL LOW (ref 12.0–15.0)
MCH: 31.5 pg (ref 26.0–34.0)
MCHC: 31.5 g/dL (ref 30.0–36.0)
MCV: 99.7 fL (ref 80.0–100.0)
Platelets: 217 10*3/uL (ref 150–400)
RBC: 3.37 MIL/uL — ABNORMAL LOW (ref 3.87–5.11)
RDW: 14.4 % (ref 11.5–15.5)
WBC: 6.5 10*3/uL (ref 4.0–10.5)
nRBC: 0 % (ref 0.0–0.2)

## 2023-09-30 LAB — POTASSIUM: Potassium: 3.6 mmol/L (ref 3.5–5.1)

## 2023-09-30 LAB — PHOSPHORUS: Phosphorus: 2.8 mg/dL (ref 2.5–4.6)

## 2023-09-30 MED ORDER — PAROXETINE HCL 20 MG PO TABS
20.0000 mg | ORAL_TABLET | Freq: Every day | ORAL | Status: DC
  Administered 2023-09-30 – 2023-10-05 (×6): 20 mg via ORAL
  Filled 2023-09-30 (×6): qty 1

## 2023-09-30 MED ORDER — ENSURE PLUS HIGH PROTEIN PO LIQD
237.0000 mL | Freq: Three times a day (TID) | ORAL | Status: DC
  Administered 2023-09-30 – 2023-10-05 (×12): 237 mL via ORAL

## 2023-09-30 MED ORDER — LOSARTAN POTASSIUM 50 MG PO TABS
25.0000 mg | ORAL_TABLET | Freq: Every day | ORAL | Status: DC
  Administered 2023-09-30: 25 mg via ORAL
  Filled 2023-09-30: qty 1

## 2023-09-30 MED ORDER — PANTOPRAZOLE SODIUM 40 MG PO TBEC
40.0000 mg | DELAYED_RELEASE_TABLET | Freq: Every day | ORAL | Status: DC
  Administered 2023-10-01 – 2023-10-05 (×5): 40 mg via ORAL
  Filled 2023-09-30 (×5): qty 1

## 2023-09-30 MED ORDER — INSULIN ASPART 100 UNIT/ML IJ SOLN
0.0000 [IU] | Freq: Three times a day (TID) | INTRAMUSCULAR | Status: DC
  Administered 2023-10-01: 1 [IU] via SUBCUTANEOUS
  Administered 2023-10-01 – 2023-10-02 (×3): 2 [IU] via SUBCUTANEOUS
  Administered 2023-10-03 – 2023-10-05 (×3): 1 [IU] via SUBCUTANEOUS
  Administered 2023-10-05: 2 [IU] via SUBCUTANEOUS
  Filled 2023-09-30: qty 1
  Filled 2023-09-30 (×2): qty 2
  Filled 2023-09-30 (×2): qty 1
  Filled 2023-09-30: qty 2
  Filled 2023-09-30 (×2): qty 1

## 2023-09-30 MED ORDER — GUAIFENESIN-DM 100-10 MG/5ML PO SYRP
5.0000 mL | ORAL_SOLUTION | ORAL | Status: DC | PRN
  Administered 2023-09-30: 5 mL via ORAL
  Filled 2023-09-30: qty 10

## 2023-09-30 MED ORDER — OLANZAPINE 10 MG PO TABS
10.0000 mg | ORAL_TABLET | Freq: Every day | ORAL | Status: DC
  Administered 2023-09-30 – 2023-10-05 (×6): 10 mg via ORAL
  Filled 2023-09-30 (×6): qty 1

## 2023-09-30 MED ORDER — INSULIN ASPART 100 UNIT/ML IJ SOLN
0.0000 [IU] | Freq: Every day | INTRAMUSCULAR | Status: DC
  Administered 2023-10-04: 2 [IU] via SUBCUTANEOUS
  Filled 2023-09-30: qty 1

## 2023-09-30 MED ORDER — FUROSEMIDE 10 MG/ML IJ SOLN
40.0000 mg | Freq: Two times a day (BID) | INTRAMUSCULAR | Status: DC
  Administered 2023-09-30 – 2023-10-05 (×10): 40 mg via INTRAVENOUS
  Filled 2023-09-30 (×10): qty 4

## 2023-09-30 MED ORDER — ADULT MULTIVITAMIN W/MINERALS CH
1.0000 | ORAL_TABLET | Freq: Every day | ORAL | Status: DC
  Administered 2023-09-30 – 2023-10-05 (×6): 1 via ORAL
  Filled 2023-09-30 (×5): qty 1

## 2023-09-30 MED ORDER — OLANZAPINE 7.5 MG PO TABS
15.0000 mg | ORAL_TABLET | Freq: Every day | ORAL | Status: DC
  Administered 2023-09-30 – 2023-10-04 (×5): 15 mg via ORAL
  Filled 2023-09-30 (×6): qty 2

## 2023-09-30 NOTE — Progress Notes (Signed)
 Occupational Therapy Evaluation Patient Details Name: Michele James MRN: [id number] DOB: 1955-05-18 Today's Date: 09/30/2023   History of Present Illness   Pt is a 68 y.o. female presenting with bilateral lower extremity swelling as well as shortness of breath. Acute hypoxic respiratory failure in the setting of Acute decompensated HFrEF, sepsis due to suspected Aspiration Pneumonia and Klebsiella Pneumoniae/Enterococcus Faecalis UTI, and Acute Metabolic Encephalopathy requiring intubation and mechanical ventilation. PMH: cerebral palsy, wheelchair-bound, history of GERD, hypothyroidism, seizures, diabetes     Clinical Impressions Michele James was seen for OT evaluation this date. Prior to hospital admission, per chart pt was dependent on caregiver assistance for ADLs. Pt lives w/ parent. Pt presents to acute OT demonstrating impaired ADL performance and functional mobility 2/2 generalized weakness and decreased activity tolerance (See OT problem list for additional functional deficits). Pt currently requires MAX A +2 for physical assistance for bed mobility and MOD A to maintain sitting EOB. Pt tolerated sitting EOB for ~5 min, SpO2 97% on 35 L HFFNC. Pt required SETUP for self-feeding ice cream.  Pt would benefit from skilled OT services to address noted impairments and functional limitations (see below for any additional details) in order to maximize safety and independence while minimizing falls risk and caregiver burden. Anticipate the need for follow up OT services upon acute hospital DC.      If plan is discharge home, recommend the following:   Two people to help with walking and/or transfers;Two people to help with bathing/dressing/bathroom     Functional Status Assessment   Patient has had a recent decline in their functional status and demonstrates the ability to make significant improvements in function in a reasonable and predictable amount of time.     Equipment  Recommendations   Other (comment) (defer to next venue of care)     Recommendations for Other Services         Precautions/Restrictions   Precautions Precautions: Fall Recall of Precautions/Restrictions: Impaired Restrictions Weight Bearing Restrictions Per Provider Order: No     Mobility Bed Mobility Overal bed mobility: Needs Assistance Bed Mobility: Supine to Sit, Sit to Supine     Supine to sit: +2 for physical assistance, Max assist Sit to supine: Max assist, +2 for physical assistance        Transfers                   General transfer comment: Not tested-seated EOB for session      Balance Overall balance assessment: Needs assistance Sitting-balance support: Single extremity supported, Feet unsupported Sitting balance-Leahy Scale: Poor Sitting balance - Comments: required MOD A for support while seated EOB due to kyphotic posture                                   ADL either performed or assessed with clinical judgement   ADL Overall ADL's : Needs assistance/impaired                                       General ADL Comments: SETUP for self-feeding     Vision         Perception         Praxis         Pertinent Vitals/Pain Pain Assessment Pain Assessment: PAINAD Breathing: normal Negative Vocalization: occasional moan/groan, low speech, negative/disapproving  quality Facial Expression: smiling or inexpressive Body Language: relaxed Consolability: no need to console PAINAD Score: 1 Pain Intervention(s): Limited activity within patient's tolerance, Monitored during session, Repositioned     Extremity/Trunk Assessment Upper Extremity Assessment Upper Extremity Assessment: Defer to OT evaluation LUE Deficits / Details: L hand/arm contractures   Lower Extremity Assessment Lower Extremity Assessment: Generalized weakness (contractures of hip knee and ankles noted, flexion at all levels, unable to  lift either LE on command)       Communication Communication Communication: No apparent difficulties   Cognition Arousal: Alert Behavior During Therapy: WFL for tasks assessed/performed Cognition: No family/caregiver present to determine baseline, Cognition impaired       Memory impairment (select all impairments): Short-term memory     OT - Cognition Comments: AO x 4, repeated her daily schedule throughout session                 Following commands: Impaired Following commands impaired: Follows one step commands inconsistently, Follows one step commands with increased time     Cueing  General Comments   Cueing Techniques: Gestural cues;Verbal cues  SpO2 97% on 35 L HF after bed mobility   Exercises     Shoulder Instructions      Home Living Family/patient expects to be discharged to:: Private residence Living Arrangements: Parent Available Help at Discharge: Family;Personal care attendant Type of Home: House Home Access: Ramped entrance     Home Layout: One level     Bathroom Shower/Tub: Producer, television/film/video: Standard Bathroom Accessibility: Yes How Accessible: Accessible via wheelchair Home Equipment: Shower seat;Wheelchair - manual;Crutches   Additional Comments: per 01/2023 eval      Prior Functioning/Environment Prior Level of Function : Needs assist             Mobility Comments: Pt states that she requires a lot of help with transfers, has not stood in years per patient report. Uses manual w/c for mobility around the house; states that she is able to mobilize the w/c herself with her R arm. ADLs Comments: Caretaker assists for showering, dressing, toilet transfers at baseline. grooming and eating from w/c level. Pt reports using crutches to t/f from w/c to toilet. Pt has LUE and BLE contracture at baseline.    OT Problem List: Decreased activity tolerance;Impaired balance (sitting and/or standing)   OT  Treatment/Interventions: Self-care/ADL training;Energy conservation      OT Goals(Current goals can be found in the care plan section)   Acute Rehab OT Goals Patient Stated Goal: to eat ice cream OT Goal Formulation: With patient Time For Goal Achievement: 10/14/23 Potential to Achieve Goals: Good ADL Goals Pt Will Perform Eating: bed level;with set-up Pt Will Perform Grooming: with set-up;sitting Pt Will Transfer to Toilet: bedside commode;squat pivot transfer;with max assist   OT Frequency:  Min 2X/week    Co-evaluation PT/OT/SLP Co-Evaluation/Treatment: Yes Reason for Co-Treatment: Complexity of the patient's impairments (multi-system involvement);For patient/therapist safety;To address functional/ADL transfers PT goals addressed during session: Mobility/safety with mobility;Balance OT goals addressed during session: ADL's and self-care      AM-PAC OT 6 Clicks Daily Activity     Outcome Measure Help from another person eating meals?: A Lot Help from another person taking care of personal grooming?: A Lot Help from another person toileting, which includes using toliet, bedpan, or urinal?: A Lot Help from another person bathing (including washing, rinsing, drying)?: A Lot Help from another person to put on and taking off regular upper body  clothing?: A Lot Help from another person to put on and taking off regular lower body clothing?: A Lot 6 Click Score: 12   End of Session    Activity Tolerance: Patient tolerated treatment well Patient left: in bed;with call bell/phone within reach;with bed alarm set  OT Visit Diagnosis: Other abnormalities of gait and mobility (R26.89);Muscle weakness (generalized) (M62.81)                Time: 8580-8557 OT Time Calculation (min): 23 min Charges:  OT General Charges $OT Visit: 1 Visit OT Evaluation $OT Eval Moderate Complexity: 1 Mod OT Treatments $Self Care/Home Management : 8-22 mins  [place]ston Shropshire, Student OT   [employer] 09/30/2023, 4:15 PM

## 2023-09-30 NOTE — Progress Notes (Signed)
 Nutrition Follow Up Note   DOCUMENTATION CODES:   Not applicable  INTERVENTION:   Ensure Plus High Protein po TID, each supplement provides 350 kcal and 20 grams of protein  Magic cup TID with meals, each supplement provides 290 kcal and 9 grams of protein  MVI po daily  Pt at high refeed risk; recommend monitor potassium, magnesium  and phosphorus labs daily until stable  Daily weights   NUTRITION DIAGNOSIS:   Inadequate oral intake related to inability to eat (pt sedated and ventilated) as evidenced by estimated needs. 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  Met with pt in room today. Pt is a poor historian. Pt sitting up eating breakfast at the time of RD visit. Pt reports that she is feeling well today. Pt reports that her appetite is good. Pt ate 100% of french toast, bacon, applesauce, coffee and milk today. Pt is able to feed herself with her right hand but requires assistance with setting up her meal tray and opening things. RD discussed with pt the importance of adequate nutrition needed to preserve lean muscle. Pt is agreeable to drink Ensure. RD will add supplements to help pt meet her estimated needs. Pt is at refeed risk. Per chart, pt is down ~27lbs since admission and appears to be back at her UBW. Pt -10.7L on her I & Os.   Medications reviewed and include: lasix , heparin , insulin, iron , synthroid , paxil , protonix , unasyn   Labs reviewed: K 3.8 wnl, P 2.8  wnl, Mg 1.8 wnl Hgb 10.6(L), Hct 33.6(L) Cbgs- 93, 81, 95 x 24 hrs  UOP-   Diet Order:   Diet Order             Diet Carb Modified Fluid consistency: Thin  Diet effective now                  EDUCATION NEEDS:   No education needs have been identified at this time  Skin:  Skin Assessment: Reviewed RN Assessment  Last BM:  7/2- type 2  Height:   Ht Readings from Last 1 Encounters:  09/27/23 5' 3 (1.6 m)    Weight:   Wt Readings from Last 1 Encounters:  09/30/23 70.2 kg    Ideal Body Weight:  52.3 kg  BMI:  Body mass index is 27.42 kg/m.  Estimated Nutritional Needs:   Kcal:  1600-1800kcal/day  Protein:  80-90g/day  Fluid:  1.4-1.6L/day  Augustin Shams MS, RD, LDN If unable to be reached, please send secure chat to RD inpatient available from 8:00a-4:00p daily

## 2023-09-30 NOTE — Evaluation (Signed)
 Physical Therapy Evaluation Patient Details Name: Michele James MRN: [id number] DOB: [birth year] Today's Date: 09/30/2023  History of Present Illness  Pt is a 68 y.o. female presenting with bilateral lower extremity swelling as well as shortness of breath. Acute hypoxic respiratory failure in the setting of Acute decompensated HFrEF, sepsis due to suspected Aspiration Pneumonia and Klebsiella Pneumoniae/Enterococcus Faecalis UTI, and Acute Metabolic Encephalopathy requiring intubation and mechanical ventilation. PMH: cerebral palsy, wheelchair-bound, history of GERD, hypothyroidism, seizures, diabetes.   Clinical Impression  Pt alert, oriented to self, hospital, month/year with extra time. Displayed STM deficits, needed redirection to task, delayed processing. Pt stated at baseline she has a caregiver but unable to provide true details about how much assistance she gives; per chart review from last hospital admission in November pt reported a lot of help with transfers, WC mobility in home, uses R hand to navigate. Assistance for all ADLs and eating as well. Pt has LUE and BLE contractures at baseline. All mobility today required maxAx2 for safety, lines/leads management. Able to sit EOB with CGA-modA due to kyphotic posture (unable to obtain adequate foot support for pt due to elevated bed surface). spO2 >90% throughout. Unable to obtain drop arm recliner/confirm true PLOF during evaluation, OOB to be assessed next session.  Overall the patient demonstrated deficits (see PT Problem List) that impede the patient's functional abilities, safety, and mobility and would benefit from skilled PT intervention.        If plan is discharge home, recommend the following: A lot of help with walking and/or transfers;A lot of help with bathing/dressing/bathroom;Help with stairs or ramp for entrance;Assistance with cooking/housework;Assist for transportation   Can travel by private vehicle        Equipment  Recommendations Other (comment) (TBD)  Recommendations for Other Services       Functional Status Assessment Patient has had a recent decline in their functional status and demonstrates the ability to make significant improvements in function in a reasonable and predictable amount of time.     Precautions / Restrictions Precautions Precautions: Fall Recall of Precautions/Restrictions: Impaired Restrictions Weight Bearing Restrictions Per Provider Order: No      Mobility  Bed Mobility Overal bed mobility: Needs Assistance Bed Mobility: Rolling Rolling: Max assist, +2 for physical assistance   Supine to sit: +2 for physical assistance, Max assist Sit to supine: Max assist, +2 for physical assistance        Transfers                   General transfer comment: not tested, unclear PLOF    Ambulation/Gait                  Stairs            Wheelchair Mobility     Tilt Bed    Modified Rankin (Stroke Patients Only)       Balance Overall balance assessment: Needs assistance Sitting-balance support: Single extremity supported, Feet unsupported Sitting balance-Leahy Scale: Poor Sitting balance - Comments: required MOD A for support while seated EOB due to kyphotic posture                                     Pertinent Vitals/Pain Pain Assessment Pain Assessment: PAINAD Breathing: normal Negative Vocalization: occasional moan/groan, low speech, negative/disapproving quality Facial Expression: smiling or inexpressive Body Language: relaxed Consolability: no need to console PAINAD Score: 1  Home Living Family/patient expects to be discharged to:: Private residence Living Arrangements: Parent Available Help at Discharge: Family;Personal care attendant Type of Home: House Home Access: Ramped entrance       Home Layout: One level Home Equipment: Shower seat;Wheelchair - manual;Crutches Additional Comments: per 01/2023  eval    Prior Function Prior Level of Function : Needs assist             Mobility Comments: Pt states that she requires a lot of help with transfers, has not stood in years per patient report. Uses manual w/c for mobility around the house; states that she is able to mobilize the w/c herself with her R arm. ADLs Comments: Caretaker assists for showering, dressing, toilet transfers at baseline. grooming and eating from w/c level. Pt reports using crutches to t/f from w/c to toilet. Pt has LUE and BLE contracture at baseline.     Extremity/Trunk Assessment   Upper Extremity Assessment Upper Extremity Assessment: Defer to OT evaluation LUE Deficits / Details: L hand/arm contractures    Lower Extremity Assessment Lower Extremity Assessment: Generalized weakness (contractures of hip knee and ankles noted, flexion at all levels, unable to lift either LE on command)       Communication   Communication Communication: No apparent difficulties    Cognition Arousal: Alert Behavior During Therapy: WFL for tasks assessed/performed                           PT - Cognition Comments: STM deficits, oriented to month/year with prompting, persveration noted in conversation. Following commands: Impaired Following commands impaired: Follows one step commands inconsistently, Follows one step commands with increased time     Cueing Cueing Techniques: Gestural cues, Verbal cues     General Comments General comments (skin integrity, edema, etc.): SpO2 97% on 35 L HF after bed mobility    Exercises     Assessment/Plan    PT Assessment Patient needs continued PT services  PT Problem List Decreased mobility;Decreased balance;Decreased activity tolerance;Decreased strength       PT Treatment Interventions DME instruction;Balance training;Gait training;Neuromuscular re-education;Functional mobility training;Patient/family education;Stair training;Therapeutic  activities;Therapeutic exercise    PT Goals (Current goals can be found in the Care Plan section)  Acute Rehab PT Goals PT Goal Formulation: Patient unable to participate in goal setting Time For Goal Achievement: 10/14/23 Potential to Achieve Goals: Fair    Frequency Min 2X/week     Co-evaluation PT/OT/SLP Co-Evaluation/Treatment: Yes Reason for Co-Treatment: Complexity of the patient's impairments (multi-system involvement);For patient/therapist safety;To address functional/ADL transfers PT goals addressed during session: Mobility/safety with mobility;Balance OT goals addressed during session: ADL's and self-care       AM-PAC PT 6 Clicks Mobility  Outcome Measure Help needed turning from your back to your side while in a flat bed without using bedrails?: Total Help needed moving from lying on your back to sitting on the side of a flat bed without using bedrails?: Total Help needed moving to and from a bed to a chair (including a wheelchair)?: Total Help needed standing up from a chair using your arms (e.g., wheelchair or bedside chair)?: Total Help needed to walk in hospital room?: Total Help needed climbing 3-5 steps with a railing? : Total 6 Click Score: 6    End of Session Equipment Utilized During Treatment: Gait belt Activity Tolerance: Patient tolerated treatment well Patient left: in bed;with call bell/phone within reach;with bed alarm set Nurse Communication: Mobility status PT Visit  Diagnosis: Other abnormalities of gait and mobility (R26.89);Difficulty in walking, not elsewhere classified (R26.2);Muscle weakness (generalized) (M62.81)    Time: 8581-8562 PT Time Calculation (min) (ACUTE ONLY): 19 min   Charges:   PT Evaluation $PT Eval Moderate Complexity: 1 Mod   PT General Charges $$ ACUTE PT VISIT: 1 Visit        Doyal Shams PT, DPT 4:17 PM,09/30/23

## 2023-09-30 NOTE — Progress Notes (Signed)
 NAME:  Michele James, MRN:  [id number], DOB:  May 16, 1955, LOS: 5 ADMISSION DATE:  09/25/2023, CONSULTATION DATE:  09/27/23 REFERRING MD:  Erminio Cone, NP, CHIEF COMPLAINT:  Acute Respiratory Failure   Brief Pt Description / Synopsis:  68 year old female with multiple medical problems admitted with acute hypoxic respiratory failure in the setting of Acute decompensated HFrEF, sepsis due to suspected Aspiration Pneumonia and Klebsiella Pneumoniae/Enterococcus Faecalis UTI, and Acute Metabolic Encephalopathy requiring intubation and mechanical ventilation.  History of Present Illness:  68 y.o female with significant PMH of IDA, GERD, T2DM, anxiety and depression, glaucoma, HLD, hypothyroidism, seizure disorders, cerebral palsy, schizophreniform disorder and PVD who presented to the ED with chief complaints of shortness of breath, productive cough and bilateral lower extremity swelling.   Per ED notes, EMS noted sats 90% on room air and she was placed on 2 L nasal cannula.   ED Course: Initial vital signs showed HR of 83 beats/minute, BP 140/90 mm Hg, the RR 16 breaths/minute, and the oxygen saturation 100% on 2L and a temperature of 98.17F (36.7C).  Pertinent Labs/Diagnostics Findings: Na+/ K+: 134/5.4, glucose: 103 BUN/Cr.: 26/0.49  WBC: 5.9 K/L  Hgb/Hct: 11.9/37.9  PCT: negative <0.10  RVP-neg UA:+many bacteria, wbc>50 BNP: 1406 CXR> with small pleural effusions, patchy consolidation or atelectasis in both lower lung fields and a large hiatal hernia. CTA was negative for PE.  Small to moderate right and small left layering pleural effusion and bibasilar atelectasis.   Medication administered in the ED: Ceftriaxone  and azithromycin  for suspected pneumonia, Lasix  20 mg IV Disposition: Admitted to [facility] service  Please see Significant Hospital Events section below for full detailed hospital course.   Pertinent  Medical History  IDA, GERD, T2DM, anxiety and depression, glaucoma, HLD,  hypothyroidism, seizure disorders, cerebral palsy, schizophreniform disorder and PVD   Micro Data:  6/27: COVID/FLU/RSC PCR>> negative 6/27: Strep pneumo & Legionella urinary antigens>> negative 6/27: Urine>> Klebsiella Pneumoniae & Enterococcus Faecalis  6/27: Blood cultures>> no growth 6/29: RVP >> negative 6/29: MRSA PCR>>negative 6/30: Tracheal aspirate>> no growth to date  Antimicrobials:   Anti-infectives (From admission, onward)    Start     Dose/Rate Route Frequency Ordered Stop   09/28/23 1800  Ampicillin -Sulbactam (UNASYN ) 3 g in sodium chloride  0.9 % 100 mL IVPB        3 g 200 mL/hr over 30 Minutes Intravenous Every 6 hours 09/28/23 1200     09/27/23 1530  azithromycin  (ZITHROMAX ) 500 mg in sodium chloride  0.9 % 250 mL IVPB        500 mg 250 mL/hr over 60 Minutes Intravenous Every 24 hours 09/27/23 1439 09/28/23 1728   09/27/23 1000  azithromycin  (ZITHROMAX ) tablet 500 mg  Status:  Discontinued        500 mg Per Tube Daily 09/27/23 0815 09/27/23 1439   09/27/23 1000  piperacillin -tazobactam (ZOSYN ) IVPB 3.375 g  Status:  Discontinued        3.375 g 12.5 mL/hr over 240 Minutes Intravenous Every 8 hours 09/27/23 0827 09/28/23 1200   09/27/23 0600  doxycycline (VIBRAMYCIN) 100 mg in sodium chloride  0.9 % 250 mL IVPB  Status:  Discontinued        100 mg 125 mL/hr over 120 Minutes Intravenous Every 12 hours 09/27/23 0447 09/27/23 0815   09/26/23 1000  cefTRIAXone  (ROCEPHIN ) 2 g in sodium chloride  0.9 % 100 mL IVPB  Status:  Discontinued        2 g 200 mL/hr over 30 Minutes  Intravenous Every 24 hours 09/25/23 1428 09/27/23 0815   09/26/23 1000  doxycycline (VIBRA-TABS) tablet 100 mg  Status:  Discontinued        100 mg Oral Every 12 hours 09/26/23 0926 09/27/23 0447   09/25/23 2200  doxycycline (VIBRAMYCIN) 100 mg in sodium chloride  0.9 % 250 mL IVPB  Status:  Discontinued        100 mg 125 mL/hr over 120 Minutes Intravenous Every 12 hours 09/25/23 1428 09/26/23 0926    09/25/23 0830  cefTRIAXone  (ROCEPHIN ) 1 g in sodium chloride  0.9 % 100 mL IVPB        1 g 200 mL/hr over 30 Minutes Intravenous  Once 09/25/23 0824 09/25/23 0943   09/25/23 0830  azithromycin  (ZITHROMAX ) 500 mg in sodium chloride  0.9 % 250 mL IVPB        500 mg 250 mL/hr over 60 Minutes Intravenous  Once 09/25/23 0824 09/25/23 1015       Significant Hospital Events: Including procedures, antibiotic start and stop dates in addition to other pertinent events   6/27:Admit to [facility] service with new onset CHF and CAP 6/28: Stable on 5L 6/29: Rapid response called for Acute hypoxic respiratory Failure and altered mental status. Transferred to ICU, patient unresponsive with agonal respirations and increased work of breathing. Intubated emergently for airway protection. 7/1: Extubated to [facility]. 7/2: No significant events noted overnight, tolerating extubation well.  Afebrile, hemodynamically stable, weaned off vasopressors.  Creatinine remains stable, UOP 5.6 L last 24 hours (net - 10.4 L), decrease Lasix  to 40 mg BID.  Consult PT/OT.  Interim History / Subjective:  As outlined above under Significant Hospital Events section  Objective   Blood pressure (!) 113/58, pulse 74, temperature 97.6 F (36.4 C), temperature source Oral, resp. rate (!) 21, height 5' 3 (1.6 m), weight 70.2 kg, SpO2 96%.    Vent Mode: PSV;CPAP FiO2 (%):  [40 %-60 %] 45 % Set Rate:  [18 bmp] 18 bmp Vt Set:  [400 mL] 400 mL PEEP:  [5 cmH20] 5 cmH20 Pressure Support:  [10 cmH20] 10 cmH20 Plateau Pressure:  [15 cmH20] 15 cmH20   Intake/Output Summary (Last 24 hours) at 09/30/2023 0742 Last data filed at 09/30/2023 9343 Gross per 24 hour  Intake 400.07 ml  Output 5615 ml  Net -5214.93 ml   Filed Weights   09/25/23 0703 09/29/23 0500 09/30/23 0500  Weight: 82.2 kg 67.4 kg 70.2 kg    Examination: General: Acute on chronically ill appearing female, laying in bed, on BiPAP overnight, in NAD HENT: Atraumatic,  normocepahlic, neck supple, no JVD Lungs: Clear breath sounds throughout, even, nonlabored, normal effort Cardiovascular: RRR, s1s2, no M/R/G Abdomen: Soft, nontender, nondistended, no gaurding or rebound tenderness, BS + x4 Extremities: Generalized weakness, left arm contractured, no edema Neuro: Awake and alert, oriented x4, moves all extremities to commands and purposefully, speech clear, pupils PERRL GU: Foley catheter in place draining yellow urine.  Resolved Hospital Problem list     Assessment & Plan:   #Acute Hypoxic Respiratory Failure due to ... #Acute Decompensated HFrEF #Right Pleural Effusion #Suspected Aspiration Pneumonia EXTUBATED 7/1 -Supplemental O2 as needed to maintain O2 sats >92% -BiPAP at bedtime  -Follow intermittent Chest X-ray & ABG as needed -Bronchodilators PRN -ABX as above -Diuresis as BP and renal function permits ~ decrease IV Lasix  to 40 mg BID on 7/2 -Pulmonary toilet as able  #Acute Decompensated HFrEF #Mildly Elevated Troponin due to Demand Ischemia  Echo w/ EF 35 to 40%,  global hypokinesis, RV systolic function is moderately reduced. Severe PH with PASP 61.48mmHg.  -Continuous cardiac monitoring -Maintain MAP >65 -Vasopressors as needed to maintain MAP goal ~ NOT REQUIRING  -Trend HS Troponin until peaked ( 27 ~ 34)  -Diuresis as BP and renal function permits ~ decrease IV Lasix  to 40 mg BID on 7/2  #Sepsis due to ... #Suspected Aspiration Pneumonia #Klebsiella Pneumoniae & Enterococcus Faecalis UTI -Monitor fever curve -Trend WBC's  -Follow cultures as above -Continue empiric Unasyn  pending cultures & sensitivities (plan for 5 day course)  #Type II Diabetes Mellitus #Hypothyroidism -CBG's q4h; Target range of 140 to 180 -SSI -Follow ICU Hypo/Hyperglycemia protocol -Continue home Synthroid   #Acute Metabolic Encephalopathy ~ IMPROVING  #Schizophrenia -Treatment of metabolic derangements & sepsis as outlined above -Provide  supportive care -Promote normal sleep/wake cycle and family presence -Avoid sedating medications as able -Resume home Carbamazepine , Olanzapine , and Paroxetine  on 7/2     Best Practice (right click and Reselect all SmartList Selections daily)   Diet/type: Regular  DVT prophylaxis: prophylactic heparin   GI prophylaxis: PPI Lines: Central line and No longer needed.  Order written to d/c  Foley:  Yes, and it is still needed Code Status:  full code Last date of multidisciplinary goals of care discussion [7/2]  7/2: Pt updated at bedside on plan of care.  Will update family when they arrive.  Labs   CBC: Recent Labs  Lab 09/25/23 0823 09/26/23 0338 09/27/23 0213 09/28/23 0406 09/29/23 0447 09/30/23 0429  WBC 5.9 6.0 9.4 7.2 7.2 6.5  NEUTROABS 4.0  --   --   --   --   --   HGB 11.9* 11.7* 11.6* 11.6* 11.3* 10.6*  HCT 37.9 36.3 35.4* 35.0* 34.7* 33.6*  MCV 99.0 99.5 97.3 95.1 97.5 99.7  PLT 276 273 235 283 251 217    Basic Metabolic Panel: Recent Labs  Lab 09/28/23 0406 09/28/23 1945 09/29/23 0447 09/29/23 1337 09/29/23 1940 09/30/23 0429  NA 139  --  139 140 132* 137  K 3.0* 2.7* 3.8 3.3* 4.2 3.8  CL 98  --  99 97* 93* 100  CO2 30  --  30 32 29 32  GLUCOSE 119*  --  108* 118* 92 89  BUN 18  --  11 9 11 11   CREATININE 0.46  --  0.39* 0.40* 0.45 0.33*  CALCIUM  7.9*  --  7.8* 8.1* 7.8* 7.9*  MG 1.7 1.5* 2.1  --   --  1.8  PHOS  --  3.2 2.8  --   --  2.8   GFR: Estimated Creatinine Clearance: 65 mL/min (A) (by C-G formula based on SCr of 0.33 mg/dL (L)). Recent Labs  Lab 09/25/23 0823 09/26/23 0338 09/27/23 0213 09/28/23 0406 09/29/23 0447 09/30/23 0429  PROCALCITON <0.10  --  <0.10  --   --   --   WBC 5.9   < > 9.4 7.2 7.2 6.5  LATICACIDVEN  --   --  1.2  --   --   --    < > = values in this interval not displayed.    Liver Function Tests: Recent Labs  Lab 09/25/23 0823 09/26/23 0338 09/27/23 0213  AST 23 23 24   ALT 33 26 27  ALKPHOS 85 80 79   BILITOT 0.8 0.9 0.8  PROT 6.2* 5.8* 5.9*  ALBUMIN 3.2* 2.9* 3.1*   No results for input(s): LIPASE, AMYLASE in the last 168 hours. No results for input(s): AMMONIA in the last  168 hours.  ABG    Component Value Date/Time   PHART 7.48 (H) 09/27/2023 0218   PCO2ART 47 09/27/2023 0218   PO2ART 98 09/27/2023 0218   HCO3 35.0 (H) 09/27/2023 0218   O2SAT 99.1 09/27/2023 0218     Coagulation Profile: No results for input(s): INR, PROTIME in the last 168 hours.  Cardiac Enzymes: No results for input(s): CKTOTAL, CKMB, CKMBINDEX, TROPONINI in the last 168 hours.  HbA1C: Hgb A1c MFr Bld  Date/Time Value Ref Range Status  09/26/2023 03:38 AM 6.5 (H) 4.8 - 5.6 % Final    Comment:    (NOTE) Diagnosis of Diabetes The following HbA1c ranges recommended by the American Diabetes Association (ADA) may be used as an aid in the diagnosis of diabetes mellitus.  Hemoglobin             Suggested A1C NGSP%              Diagnosis  <5.7                   Non Diabetic  5.7-6.4                Pre-Diabetic  >6.4                   Diabetic  <7.0                   Glycemic control for                       adults with diabetes.      CBG: Recent Labs  Lab 09/29/23 1122 09/29/23 1622 09/29/23 1937 09/29/23 2327 09/30/23 0345  GLUCAP 124* 148* 107* 102* 95    Review of Systems:   Positives in BOLD: Pt currently denies all complaints Gen: Denies fever, chills, weight change, fatigue, night sweats HEENT: Denies blurred vision, double vision, hearing loss, tinnitus, sinus congestion, rhinorrhea, sore throat, neck stiffness, dysphagia PULM: Denies shortness of breath, cough, sputum production, hemoptysis, wheezing CV: Denies chest pain, edema, orthopnea, paroxysmal nocturnal dyspnea, palpitations GI: Denies abdominal pain, nausea, vomiting, diarrhea, hematochezia, melena, constipation, change in bowel habits GU: Denies dysuria, hematuria, polyuria, oliguria, urethral  discharge Endocrine: Denies hot or cold intolerance, polyuria, polyphagia or appetite change Derm: Denies rash, dry skin, scaling or peeling skin change Heme: Denies easy bruising, bleeding, bleeding gums Neuro: Denies headache, numbness, weakness, slurred speech, loss of memory or consciousness   Past Medical History:  She,  has a past medical history of Anemia, Arthritis, Cerebral palsy (HCC), Depression, Diabetes mellitus without complication (HCC), Diverticulosis, Dysphagia, Eczema, GERD (gastroesophageal reflux disease), Glaucoma, Hemorrhoids, Hyperlipidemia, Hypothyroidism, and Seizures (HCC).   Surgical History:   Past Surgical History:  Procedure Laterality Date   BREAST BIOPSY Right 03/22/2020   stereo bx, ribbon clip,  FAT NECROSIS AND FIBROSIS   CATARACT EXTRACTION W/PHACO Right 02/06/2016   Procedure: CATARACT EXTRACTION PHACO AND INTRAOCULAR LENS PLACEMENT (IOC);  Surgeon: Dene Etienne, MD;  Location: [facility];  Service: Ophthalmology;  Laterality: Right;   CATARACT EXTRACTION W/PHACO Left 03/05/2016   Procedure: CATARACT EXTRACTION PHACO AND INTRAOCULAR LENS PLACEMENT (IOC);  Surgeon: Dene Etienne, MD;  Location: [facility];  Service: Ophthalmology;  Laterality: Left;   CHOLECYSTECTOMY N/A 11/16/2015   Procedure: LAPAROSCOPIC CHOLECYSTECTOMY;  Surgeon: Elgin Laurence III, MD;  Location: [facility] ORS;  Service: General;  Laterality: N/A;  attempted cholangiogram   COLONOSCOPY WITH PROPOFOL  N/A 12/25/2014   Procedure:  COLONOSCOPY WITH PROPOFOL ;  Surgeon: Deward CINDERELLA Piedmont, MD;  Location: [facility] END[facility]COPY;  Service: Gastroenterology;  Laterality: N/A;   COLONOSCOPY WITH PROPOFOL  N/A 04/14/2019   Procedure: COLONOSCOPY WITH PROPOFOL ;  Surgeon: Toledo, Ladell POUR, MD;  Location: [facility] ENDOSCOPY;  Service: Gastroenterology;  Laterality: N/A;   COLONOSCOPY WITH PROPOFOL  N/A 04/20/2022   Procedure: COLONOSCOPY WITH PROPOFOL ;  Surgeon: Therisa Bi, MD;  Location: [facility]  ENDOSCOPY;  Service: Gastroenterology;  Laterality: N/A;   ENDOSCOPIC RETROGRADE CHOLANGIOPANCREATOGRAPHY (ERCP) WITH PROPOFOL  N/A 11/18/2015   Procedure: ENDOSCOPIC RETROGRADE CHOLANGIOPANCREATOGRAPHY (ERCP) WITH PROPOFOL ;  Surgeon: Rogelia Copping, MD;  Location: [facility] ENDOSCOPY;  Service: Endoscopy;  Laterality: N/A;   ESOPHAGOGASTRODUODENOSCOPY (EGD) WITH PROPOFOL  N/A 04/14/2019   Procedure: ESOPHAGOGASTRODUODENOSCOPY (EGD) WITH PROPOFOL ;  Surgeon: Toledo, Ladell POUR, MD;  Location: [facility] ENDOSCOPY;  Service: Gastroenterology;  Laterality: N/A;   ESOPHAGOGASTRODUODENOSCOPY (EGD) WITH PROPOFOL  N/A 04/20/2022   Procedure: ESOPHAGOGASTRODUODENOSCOPY (EGD) WITH PROPOFOL ;  Surgeon: Therisa Bi, MD;  Location: [facility] ENDOSCOPY;  Service: Gastroenterology;  Laterality: N/A;   EYE SURGERY     Cataract Surgery    HERNIA REPAIR       Social History:   reports that she quit smoking about 43 years ago. Her smoking use included cigarettes. She has never used smokeless tobacco. She reports that she does not drink alcohol  and does not use drugs.   Family History:  Her family history includes Breast cancer in her maternal aunt; Diabetes Mellitus II in her maternal grandfather, maternal grandmother, and mother; High blood pressure in her father and mother; Hyperlipidemia in her mother; Osteoporosis in her mother; Stroke in her maternal grandfather, maternal grandmother, and mother.   Allergies Allergies  Allergen Reactions   Aminophylline Other (See Comments)    Headache   Cinnamon Other (See Comments)    Stomachache    Rice     Pt states it causes infection    Amoxicillin Rash   Latex Rash   Nsaids Rash   Penicillins Nausea And Vomiting   Potassium Nausea And Vomiting and Nausea Only   Potassium-Containing Compounds Nausea And Vomiting   Septra [Sulfamethoxazole-Trimethoprim] Rash   Sulfa Antibiotics Rash   Sulfasalazine Rash   Tape Rash    Paper tape ok   Theophyllines Other (See Comments)     Headache      Home Medications  Prior to Admission medications   Medication Sig Start Date End Date Taking? Authorizing Provider  acetaminophen  (TYLENOL ) 325 MG tablet Take 325 mg by mouth every 6 (six) hours as needed.   Yes [provider]  alendronate  (FOSAMAX ) 70 MG tablet Take 70 mg by mouth once a week. Take with a full glass of water on an empty stomach.   Yes [provider]  ALPRAZolam  (XANAX ) 0.25 MG tablet Take 0.25 mg by mouth as needed for anxiety. Before pap smears   Yes [provider]  atorvastatin  (LIPITOR) 40 MG tablet Take 40 mg by mouth daily.   Yes [provider]  calcium  citrate-vitamin D  (CITRACAL+D) 315-200 MG-UNIT per tablet Take 1 tablet by mouth 2 (two) times daily.   Yes [provider]  carbamazepine  (TEGRETOL ) 200 MG tablet Take 200 mg by mouth 3 (three) times daily.   Yes [provider]  Cholecalciferol  100 MCG (4000 UT) TABS Take 1,000 Units by mouth.   Yes [provider]  esomeprazole (NEXIUM) 40 MG capsule Take 40 mg by mouth daily at 12 noon.   Yes [provider]  famotidine  (PEPCID ) 20 MG tablet  Take 20 mg by mouth 2 (two) times daily. 04/23/20  Yes [provider]  hydroxypropyl methylcellulose / hypromellose (ISOPTO TEARS / GONIOVISC) 2.5 % ophthalmic solution 1 drop as needed for dry eyes.   Yes [provider]  iron  polysaccharides (NIFEREX) 150 MG capsule Take 1 capsule (150 mg total) by mouth 2 (two) times daily for 30 days, THEN 1 capsule (150 mg total) daily. 04/21/22 09/25/23 Yes Von Bellis, MD  levothyroxine  (SYNTHROID ) 100 MCG tablet Take 100 mcg by mouth every morning. 03/04/21  Yes [provider]  magnesium  oxide (MAG-OX) 400 (240 Mg) MG tablet Take 1 tablet by mouth 3 (three) times daily. 08/25/23  Yes [provider]  naproxen (NAPROSYN) 375 MG tablet Take 375 mg by mouth 2 (two) times daily with a meal.   Yes [provider]   OLANZapine  (ZYPREXA ) 10 MG tablet Take 1 tablet (10 mg total) by mouth at bedtime. Patient taking differently: Take 10 mg by mouth at bedtime. Take 10 mg in addition to 15 mg tablet for total 25 mg at bedtime 06/10/23 09/25/23 Yes Hisada, Katheren, MD  OLANZapine  (ZYPREXA ) 15 MG tablet Take 1 tablet (15 mg total) by mouth at bedtime. Patient taking differently: Take 15 mg by mouth at bedtime. Take 15 mg in addition to one 10 mg tablet for total 25 mg at bedtime 09/03/23 12/02/23 Yes Hisada, Katheren, MD  OLANZapine  (ZYPREXA ) 5 MG tablet Take 1 tablet (5 mg total) by mouth daily. 05/12/23 09/25/23 Yes Hisada, Katheren, MD  PARoxetine  (PAXIL ) 20 MG tablet TAKE 1 TABLET BY MOUTH EVERY DAY 06/05/23  Yes Okey Barnie SAUNDERS, MD  potassium chloride  20 MEQ/15ML (10%) SOLN Take 10 mEq by mouth daily.   Yes [provider]  vitamin E 400 UNIT capsule Take 400 Units by mouth daily.   Yes [provider]     Care time: 40 minutes     Inge Lecher, AGACNP-BC [facility] Prefer epic messenger for cross cover needs If after hours, please call E-link

## 2023-09-30 NOTE — Plan of Care (Signed)
  Problem: Coping: Goal: Ability to adjust to condition or change in health will improve Outcome: Progressing   Problem: Fluid Volume: Goal: Ability to maintain a balanced intake and output will improve Outcome: Progressing   Problem: Health Behavior/Discharge Planning: Goal: Ability to identify and utilize available resources and services will improve Outcome: Progressing   Problem: Skin Integrity: Goal: Risk for impaired skin integrity will decrease Outcome: Progressing   Problem: Clinical Measurements: Goal: Diagnostic test results will improve Outcome: Progressing Goal: Respiratory complications will improve Outcome: Progressing   Problem: Nutrition: Goal: Adequate nutrition will be maintained Outcome: Progressing

## 2023-09-30 NOTE — Progress Notes (Signed)
 [facility] CLINIC CARDIOLOGY PROGRESS NOTE       Patient ID: Michele James MRN: [id number] DOB/AGE: [birth year] 68 y.o.  Admit date: 09/25/2023 Referring Physician Dr. Malka Primary Physician Michele Lenis, MD Primary Cardiologist None Reason for Consultation New onset CHF with rEF  HPI: Michele James is a 68 y.o. female  with a past medical history of type 2 diabetes mellitus, GERD, hypothyroidism, seizures, cerebral palsy, PVD who presented to the ED on 09/25/2023 for SOB, productive cough and bilateral lower extremity swelling. Patient admitted due to new onset CHF and CAP. On 06/29 rapid response called for acute hypoxic respiratory failure and AMS. Patient transferred to ICU and required intubation for airway protection. Cardiology was consulted for further evaluation of new onset HFrEF.  Interval History: -Patient seen and examined this AM. Patient awake and responsive this AM.  -Patient extubated on 07/01 at 1020, now on HFNC 40. -Patient weaning off of levo gtt on 07/01 at 1700 and BP improving. HR remains stable. -Overnight Tele showed no significant events.  -Yesterday UOP 5.6L. With stable renal function. -Electrolytes stable this AM.    Review of systems complete and found to be negative unless listed above    Past Medical History:  Diagnosis Date   Anemia    Arthritis    knees   Cerebral palsy (HCC)    Depression    Diabetes mellitus without complication (HCC)    Diverticulosis    Dysphagia    Eczema    GERD (gastroesophageal reflux disease)    Glaucoma    Hemorrhoids    Hyperlipidemia    Hypothyroidism    Seizures (HCC)    none in over 10 yrs    Past Surgical History:  Procedure Laterality Date   BREAST BIOPSY Right 03/22/2020   stereo bx, ribbon clip,  FAT NECROSIS AND FIBROSIS   CATARACT EXTRACTION W/PHACO Right 02/06/2016   Procedure: CATARACT EXTRACTION PHACO AND INTRAOCULAR LENS PLACEMENT (IOC);  Surgeon: Dene Etienne, MD;  Location:  [facility];  Service: Ophthalmology;  Laterality: Right;   CATARACT EXTRACTION W/PHACO Left 03/05/2016   Procedure: CATARACT EXTRACTION PHACO AND INTRAOCULAR LENS PLACEMENT (IOC);  Surgeon: Dene Etienne, MD;  Location: [facility];  Service: Ophthalmology;  Laterality: Left;   CHOLECYSTECTOMY N/A 11/16/2015   Procedure: LAPAROSCOPIC CHOLECYSTECTOMY;  Surgeon: Elgin Laurence III, MD;  Location: [facility] ORS;  Service: General;  Laterality: N/A;  attempted cholangiogram   COLONOSCOPY WITH PROPOFOL  N/A 12/25/2014   Procedure: COLONOSCOPY WITH PROPOFOL ;  Surgeon: Deward CINDERELLA Piedmont, MD;  Location: [facility] END[facility]COPY;  Service: Gastroenterology;  Laterality: N/A;   COLONOSCOPY WITH PROPOFOL  N/A 04/14/2019   Procedure: COLONOSCOPY WITH PROPOFOL ;  Surgeon: Toledo, Ladell POUR, MD;  Location: [facility] ENDOSCOPY;  Service: Gastroenterology;  Laterality: N/A;   COLONOSCOPY WITH PROPOFOL  N/A 04/20/2022   Procedure: COLONOSCOPY WITH PROPOFOL ;  Surgeon: Therisa Bi, MD;  Location: [facility] ENDOSCOPY;  Service: Gastroenterology;  Laterality: N/A;   ENDOSCOPIC RETROGRADE CHOLANGIOPANCREATOGRAPHY (ERCP) WITH PROPOFOL  N/A 11/18/2015   Procedure: ENDOSCOPIC RETROGRADE CHOLANGIOPANCREATOGRAPHY (ERCP) WITH PROPOFOL ;  Surgeon: Rogelia Copping, MD;  Location: [facility] ENDOSCOPY;  Service: Endoscopy;  Laterality: N/A;   ESOPHAGOGASTRODUODENOSCOPY (EGD) WITH PROPOFOL  N/A 04/14/2019   Procedure: ESOPHAGOGASTRODUODENOSCOPY (EGD) WITH PROPOFOL ;  Surgeon: Toledo, Ladell POUR, MD;  Location: [facility] ENDOSCOPY;  Service: Gastroenterology;  Laterality: N/A;   ESOPHAGOGASTRODUODENOSCOPY (EGD) WITH PROPOFOL  N/A 04/20/2022   Procedure: ESOPHAGOGASTRODUODENOSCOPY (EGD) WITH PROPOFOL ;  Surgeon: Therisa Bi, MD;  Location: [facility] ENDOSCOPY;  Service: Gastroenterology;  Laterality: N/A;   EYE SURGERY  Cataract Surgery    HERNIA REPAIR      Medications Prior to Admission  Medication Sig Dispense Refill Last Dose/Taking   acetaminophen  (TYLENOL ) 325 MG  tablet Take 325 mg by mouth every 6 (six) hours as needed.   Unknown   alendronate  (FOSAMAX ) 70 MG tablet Take 70 mg by mouth once a week. Take with a full glass of water on an empty stomach.   Past Week   ALPRAZolam  (XANAX ) 0.25 MG tablet Take 0.25 mg by mouth as needed for anxiety. Before pap smears   Unknown   atorvastatin  (LIPITOR) 40 MG tablet Take 40 mg by mouth daily.   09/24/2023   calcium  citrate-vitamin D  (CITRACAL+D) 315-200 MG-UNIT per tablet Take 1 tablet by mouth 2 (two) times daily.   09/24/2023   carbamazepine  (TEGRETOL ) 200 MG tablet Take 200 mg by mouth 3 (three) times daily.   09/24/2023   Cholecalciferol  100 MCG (4000 UT) TABS Take 1,000 Units by mouth.   09/24/2023   esomeprazole (NEXIUM) 40 MG capsule Take 40 mg by mouth daily at 12 noon.   09/24/2023   famotidine  (PEPCID ) 20 MG tablet Take 20 mg by mouth 2 (two) times daily.   09/24/2023   hydroxypropyl methylcellulose / hypromellose (ISOPTO TEARS / GONIOVISC) 2.5 % ophthalmic solution 1 drop as needed for dry eyes.   Unknown   iron  polysaccharides (NIFEREX) 150 MG capsule Take 1 capsule (150 mg total) by mouth 2 (two) times daily for 30 days, THEN 1 capsule (150 mg total) daily. 150 capsule 0 09/24/2023   levothyroxine  (SYNTHROID ) 100 MCG tablet Take 100 mcg by mouth every morning.   09/24/2023   magnesium  oxide (MAG-OX) 400 (240 Mg) MG tablet Take 1 tablet by mouth 3 (three) times daily.   09/24/2023   naproxen (NAPROSYN) 375 MG tablet Take 375 mg by mouth 2 (two) times daily with a meal.   09/24/2023   OLANZapine  (ZYPREXA ) 10 MG tablet Take 1 tablet (10 mg total) by mouth at bedtime. (Patient taking differently: Take 10 mg by mouth at bedtime. Take 10 mg in addition to 15 mg tablet for total 25 mg at bedtime) 90 tablet 0 09/24/2023   OLANZapine  (ZYPREXA ) 15 MG tablet Take 1 tablet (15 mg total) by mouth at bedtime. (Patient taking differently: Take 15 mg by mouth at bedtime. Take 15 mg in addition to one 10 mg tablet for total 25 mg at  bedtime) 90 tablet 0 09/24/2023   OLANZapine  (ZYPREXA ) 5 MG tablet Take 1 tablet (5 mg total) by mouth daily. 90 tablet 0 09/24/2023 Morning   PARoxetine  (PAXIL ) 20 MG tablet TAKE 1 TABLET BY MOUTH EVERY DAY 90 tablet 1 09/24/2023   potassium chloride  20 MEQ/15ML (10%) SOLN Take 10 mEq by mouth daily.   09/24/2023   vitamin E 400 UNIT capsule Take 400 Units by mouth daily.   09/24/2023   Social History   Socioeconomic History   Marital status: Single    Spouse name: Not on file   Number of children: Not on file   Years of education: Not on file   Highest education level: Not on file  Occupational History   Not on file  Tobacco Use   Smoking status: Former    Current packs/day: 0.00    Types: Cigarettes    Quit date: 06/20/1980    Years since quitting: 43.3   Smokeless tobacco: Never   Tobacco comments:    smoked socially in early 1980s (reports for 4 months)  Vaping Use   Vaping status: Never Used  Substance and Sexual Activity   Alcohol  use: No   Drug use: No   Sexual activity: Not Currently  Other Topics Concern   Not on file  Social History Narrative   Not on file   Social Drivers of Health   Financial Resource Strain: Low Risk  (09/28/2023)   Overall Financial Resource Strain (CARDIA)    Difficulty of Paying Living Expenses: Not hard at all  Food Insecurity: No Food Insecurity (09/28/2023)   Hunger Vital Sign    Worried About Running Out of Food in the Last Year: Never true    Ran Out of Food in the Last Year: Never true  Transportation Needs: No Transportation Needs (09/28/2023)   PRAPARE - Administrator, Civil Service (Medical): No    Lack of Transportation (Non-Medical): No  Physical Activity: Not on file  Stress: Not on file  Social Connections: Unknown (09/26/2023)   Social Connection and Isolation Panel    Frequency of Communication with Friends and Family: Patient unable to answer    Frequency of Social Gatherings with Friends and Family: Patient  unable to answer    Attends Religious Services: Patient unable to answer    Active Member of Clubs or Organizations: Patient unable to answer    Attends Banker Meetings: Patient unable to answer    Marital Status: Never married  Intimate Partner Violence: Patient Unable To Answer (09/26/2023)   Humiliation, Afraid, Rape, and Kick questionnaire    Fear of Current or Ex-Partner: Patient unable to answer    Emotionally Abused: Patient unable to answer    Physically Abused: Patient unable to answer    Sexually Abused: Patient unable to answer    Family History  Problem Relation Age of Onset   High blood pressure Mother    Hyperlipidemia Mother    Diabetes Mellitus II Mother    Stroke Mother    Osteoporosis Mother    High blood pressure Father    Breast cancer Maternal Aunt    Stroke Maternal Grandfather    Diabetes Mellitus II Maternal Grandfather    Stroke Maternal Grandmother    Diabetes Mellitus II Maternal Grandmother      Vitals:   09/30/23 0932 09/30/23 1000 09/30/23 1027 09/30/23 1100  BP: 124/74 124/73  120/61  Pulse: 78 86  88  Resp: 14 20  20   Temp:      TempSrc:      SpO2: 97% 97% 97% 96%  Weight:      Height:        PHYSICAL EXAM General: Chronically ill-appearing elderly female, well nourished, in no acute distress. HEENT: Normocephalic and atraumatic. Neck: No JVD.   Lungs: Clear breath sounds bilaterally, normal effort. HFNC  Heart: HRRR. Normal S1 and S2 without gallops or murmurs.  Abdomen: Non-distended appearing.  Msk: Normal strength and tone for age. Extremities: Warm and well perfused. No clubbing, cyanosis.  1+ pedal edema.  Labs: Basic Metabolic Panel: Recent Labs    09/29/23 0447 09/29/23 1337 09/29/23 1940 09/30/23 0429  NA 139   < > 132* 137  K 3.8   < > 4.2 3.8  CL 99   < > 93* 100  CO2 30   < > 29 32  GLUCOSE 108*   < > 92 89  BUN 11   < > 11 11  CREATININE 0.39*   < > 0.45 0.33*  CALCIUM  7.8*   < >  7.8* 7.9*  MG  2.1  --   --  1.8  PHOS 2.8  --   --  2.8   < > = values in this interval not displayed.   Liver Function Tests: No results for input(s): AST, ALT, ALKPHOS, BILITOT, PROT, ALBUMIN in the last 72 hours.  No results for input(s): LIPASE, AMYLASE in the last 72 hours. CBC: Recent Labs    09/29/23 0447 09/30/23 0429  WBC 7.2 6.5  HGB 11.3* 10.6*  HCT 34.7* 33.6*  MCV 97.5 99.7  PLT 251 217   Cardiac Enzymes: Recent Labs    09/27/23 1708  TROPONINIHS 34*   BNP: No results for input(s): BNP in the last 72 hours.  D-Dimer: No results for input(s): DDIMER in the last 72 hours. Hemoglobin A1C: No results for input(s): HGBA1C in the last 72 hours.  Fasting Lipid Panel: Recent Labs    09/28/23 0406  TRIG 102   Thyroid  Function Tests: No results for input(s): TSH, T4TOTAL, T3FREE, THYROIDAB in the last 72 hours.  Invalid input(s): FREET3 Anemia Panel: No results for input(s): VITAMINB12, FOLATE, FERRITIN, TIBC, IRON , RETICCTPCT in the last 72 hours.   Radiology: [facility] Chest Port 1 View Result Date: 09/30/2023 CLINICAL DATA:  Shortness of breath. EXAM: PORTABLE CHEST 1 VIEW COMPARISON:  09/27/2023. FINDINGS: Stable left IJ CVC catheter tip at the superior cavoatrial junction. The heart size and mediastinal contours are within normal limits. Small bilateral layering pleural effusions with decreased patchy bibasilar opacities and improved aeration at the lung bases. No pneumothorax. The visualized skeletal structures are unchanged. IMPRESSION: Small bilateral pleural effusions with decreased patchy bibasilar opacities and improved aeration at the lung bases. Electronically Signed   By: Harrietta Sherry M.D.   On: 09/30/2023 08:36   ECHOCARDIOGRAM COMPLETE Result Date: 09/27/2023    ECHOCARDIOGRAM REPORT   Patient Name:   VALLORY OETKEN Date of Exam: 09/27/2023 Medical Rec #:  [id number]      Height:       63.0 in Accession #:    [id number]      Weight:       181.2 lb Date of Birth:  03-15-1956      BSA:          1.854 m Patient Age:    68 years       BP:           104/56 mmHg Patient Gender: F              HR:           62 bpm. Exam Location:  [facility] Procedure: 2D Echo, Color Doppler, Cardiac Doppler and Strain Analysis (Both            Spectral and Color Flow Doppler were utilized during procedure). Indications:     I50.31 Acute Diastolic CHF  History:         Patient has prior history of Echocardiogram examinations. Risk                  Factors:Hypertension and Dyslipidemia.  Sonographer:     L. Thornton-Maynard Referring Phys:  [id number] CAMILA A THOMAS Diagnosing Phys: Redell Cave MD  Sonographer Comments: Echo performed with patient supine and on artificial respirator. Global longitudinal strain was attempted. IMPRESSIONS  1. Left ventricular ejection fraction, by estimation, is 35 to 40%. The left ventricle has moderately decreased function. The left ventricle demonstrates global hypokinesis. Left ventricular diastolic parameters are consistent with Grade I diastolic dysfunction (  impaired relaxation). There is the interventricular septum is flattened in systole and diastole, consistent with right ventricular pressure and volume overload.  2. Right ventricular systolic function is moderately reduced. The right ventricular size is mildly enlarged. There is severely elevated pulmonary artery systolic pressure. The estimated right ventricular systolic pressure is 61.1 mmHg.  3. Right atrial size was severely dilated.  4. The mitral valve is normal in structure. No evidence of mitral valve regurgitation.  5. The aortic valve is tricuspid. Aortic valve regurgitation is mild. Aortic valve sclerosis is present, with no evidence of aortic valve stenosis. FINDINGS  Left Ventricle: Left ventricular ejection fraction, by estimation, is 35 to 40%. The left ventricle has moderately decreased function. The left ventricle demonstrates global hypokinesis. The  left ventricular internal cavity size was normal in size. There is no left ventricular hypertrophy. The interventricular septum is flattened in systole and diastole, consistent with right ventricular pressure and volume overload. Left ventricular diastolic parameters are consistent with Grade I diastolic dysfunction (impaired relaxation). Right Ventricle: The right ventricular size is mildly enlarged. No increase in right ventricular wall thickness. Right ventricular systolic function is moderately reduced. There is severely elevated pulmonary artery systolic pressure. The tricuspid regurgitant velocity is 3.81 m/s, and with an assumed right atrial pressure of 3 mmHg, the estimated right ventricular systolic pressure is 61.1 mmHg. Left Atrium: Left atrial size was normal in size. Right Atrium: Right atrial size was severely dilated. Pericardium: There is no evidence of pericardial effusion. Mitral Valve: The mitral valve is normal in structure. No evidence of mitral valve regurgitation. MV peak gradient, 2.9 mmHg. The mean mitral valve gradient is 1.0 mmHg. Tricuspid Valve: The tricuspid valve is normal in structure. Tricuspid valve regurgitation is mild. Aortic Valve: The aortic valve is tricuspid. Aortic valve regurgitation is mild. Aortic valve sclerosis is present, with no evidence of aortic valve stenosis. Aortic valve mean gradient measures 2.0 mmHg. Aortic valve peak gradient measures 3.3 mmHg. Aortic valve area, by VTI measures 2.43 cm. Pulmonic Valve: The pulmonic valve was grossly normal. Pulmonic valve regurgitation is not visualized. Aorta: The aortic root and ascending aorta are structurally normal, with no evidence of dilitation. Venous: IVC assessment for right atrial pressure unable to be performed due to mechanical ventilation. IAS/Shunts: No atrial level shunt detected by color flow Doppler.  LEFT VENTRICLE PLAX 2D LVIDd:         3.70 cm     Diastology LVIDs:         2.90 cm     LV e' medial:     5.70 cm/s LV PW:         0.90 cm     LV E/e' medial:  7.6 LV IVS:        0.90 cm     LV e' lateral:   6.83 cm/s LVOT diam:     1.90 cm     LV E/e' lateral: 6.4 LV SV:         45 LV SV Index:   24 LVOT Area:     2.84 cm  LV Volumes (MOD) LV vol d, MOD A2C: 33.5 ml LV vol d, MOD A4C: 35.8 ml LV vol s, MOD A2C: 17.4 ml LV vol s, MOD A4C: 11.8 ml LV SV MOD A2C:     16.1 ml LV SV MOD A4C:     35.8 ml LV SV MOD BP:      20.5 ml RIGHT VENTRICLE  IVC RV Basal diam:  4.40 cm    IVC diam: 2.10 cm RV Mid diam:    2.90 cm RV S prime:     8.15 cm/s TAPSE (M-mode): 0.7 cm LEFT ATRIUM             Index        RIGHT ATRIUM           Index LA diam:        3.30 cm 1.78 cm/m   RA Area:     24.80 cm LA Vol (A2C):   32.5 ml 17.53 ml/m  RA Volume:   97.20 ml  52.42 ml/m LA Vol (A4C):   26.3 ml 14.18 ml/m LA Biplane Vol: 30.2 ml 16.29 ml/m  AORTIC VALVE                    PULMONIC VALVE AV Area (Vmax):    2.58 cm     PV Vmax:       0.62 m/s AV Area (Vmean):   2.21 cm     PV Peak grad:  1.6 mmHg AV Area (VTI):     2.43 cm AV Vmax:           90.85 cm/s AV Vmean:          63.650 cm/s AV VTI:            0.184 m AV Peak Grad:      3.3 mmHg AV Mean Grad:      2.0 mmHg LVOT Vmax:         82.70 cm/s LVOT Vmean:        49.600 cm/s LVOT VTI:          0.158 m LVOT/AV VTI ratio: 0.86  AORTA Ao Root diam: 3.30 cm Ao Asc diam:  3.20 cm MITRAL VALVE               TRICUSPID VALVE MV Area (PHT): 2.87 cm    TR Peak grad:   58.1 mmHg MV Area VTI:   2.02 cm    TR Vmax:        381.00 cm/s MV Peak grad:  2.9 mmHg MV Mean grad:  1.0 mmHg    SHUNTS MV Vmax:       0.85 m/s    Systemic VTI:  0.16 m MV Vmean:      42.8 cm/s   Systemic Diam: 1.90 cm MV Decel Time: 264 msec MV E velocity: 43.50 cm/s MV A velocity: 75.50 cm/s MV E/A ratio:  0.58 Redell Cave MD Electronically signed by Redell Cave MD Signature Date/Time: 09/27/2023/7:23:05 PM    Final    DG Abd 1 View Result Date: 09/27/2023 CLINICAL DATA:  [id number] Encounter for  imaging study to confirm orogastric (OG) tube placement [id number] EXAM: ABDOMEN - 1 VIEW COMPARISON:  Earlier film of the same day, CT 09/22/2023 FINDINGS: Gastric tube overlies the medial left infrahilar chest and left upper abdomen corresponding to hiatal hernia seen on prior CT. The stomach is nondistended. visualized small bowel and colon normal. Lumbar levoscoliosis with multilevel spondylitic change. IMPRESSION: Gastric tube extends to the level of hiatal hernia. Electronically Signed   By: JONETTA Faes M.D.   On: 09/27/2023 10:30   DG Abd 1 View Result Date: 09/27/2023 CLINICAL DATA:  [id number]. Encounter for study to confirm nasogastric tube placement. EXAM: ABDOMEN - 1 VIEW COMPARISON:  PET-CT 07/02/2020, abdomen x-rays 10/21/2022 FINDINGS: There is A moderate sized hiatal hernia. NGT passes  into the stomach below the diaphragm and is probably in the distal body of stomach. The bowel pattern is nonobstructive with moderate retained stool. The pelvis was excluded from the exam. There is no semi supine evidence of free air. No other significant radiographic findings. Advanced degenerative change lumbar spine. IMPRESSION: 1. NGT passes into the stomach below the diaphragm and is probably in the distal body of stomach. 2. Moderate sized hiatal hernia. 3. Nonobstructive bowel pattern with moderate retained stool. Electronically Signed   By: Francis Quam M.D.   On: 09/27/2023 05:11   Portable Chest x-ray Result Date: 09/27/2023 CLINICAL DATA:  Central line placement EXAM: PORTABLE CHEST 1 VIEW COMPARISON:  Same day chest radiograph FINDINGS: Endotracheal tube tip in the mid intrathoracic trachea. Left IJ CVC tip in the right atrium. No pneumothorax. Similar patchy bilateral airspace opacities and layering pleural effusions. Stable cardiomediastinal silhouette. Aortic atherosclerotic calcification. IMPRESSION: 1. Left IJ CVC tip in the right atrium. No pneumothorax. 2. Similar patchy bilateral airspace opacities  and layering pleural effusions. Electronically Signed   By: Norman Gatlin M.D.   On: 09/27/2023 02:10   DG Chest Port 1 View Result Date: 09/27/2023 CLINICAL DATA:  Endotracheal tube placement EXAM: PORTABLE CHEST 1 VIEW COMPARISON:  09/25/2023 FINDINGS: Endotracheal tube tip in the mid intrathoracic trachea. Low lung volumes. Stable cardiomediastinal silhouette. Aortic atherosclerotic calcification. Diffuse bronchial wall thickening compatible with bronchitis. Small bilateral pleural effusions and basilar airspace opacities. No pneumothorax. No displaced rib fracture. Remote left rib fractures. IMPRESSION: 1. Endotracheal tube tip in the mid intrathoracic trachea. 2. Small bilateral pleural effusions and basilar airspace opacities, atelectasis versus pneumonia. 3. Bronchitis. Electronically Signed   By: Norman Gatlin M.D.   On: 09/27/2023 00:51   CT Angio Chest PE W/Cm &/Or Wo Cm Result Date: 09/25/2023 CLINICAL DATA:  Pulmonary embolism (PE) suspected, high prob, dyspnea EXAM: CT ANGIOGRAPHY CHEST WITH CONTRAST TECHNIQUE: Multidetector CT imaging of the chest was performed using the standard protocol during bolus administration of intravenous contrast. Multiplanar CT image reconstructions and MIPs were obtained to evaluate the vascular anatomy. RADIATION DOSE REDUCTION: This exam was performed according to the departmental dose-optimization program which includes automated exposure control, adjustment of the mA and/or kV according to patient size and/or use of iterative reconstruction technique. CONTRAST:  75mL OMNIPAQUE  IOHEXOL  350 MG/ML SOLN COMPARISON:  Chest radiograph from earlier today. 10/18/2022 chest CT angiogram. FINDINGS: Cardiovascular: Streaky indistinct areas of low-attenuation throughout the main, right and left pulmonary arteries, favor mixing artifact. No convincing filling defects in the pulmonary arteries to suggest pulmonary embolism. Atherosclerotic nonaneurysmal thoracic aorta.  Normal caliber pulmonary arteries. Top-normal heart size. No significant pericardial fluid/thickening. Mediastinum/Nodes: No significant thyroid  nodules. Unremarkable esophagus. No pathologically enlarged axillary, mediastinal or hilar lymph nodes. Lungs/Pleura: No pneumothorax. Small to moderate right and small left layering pleural effusions. Moderate dependent bibasilar atelectasis. Mosaic attenuation throughout both lungs. Indistinct tiny 0.3 cm posterior right upper lobe solid pulmonary nodule on series 5/image 69, stable. No new significant pulmonary nodules in the aerated portions of the lungs. No acute consolidative airspace disease. Upper abdomen: No acute abnormality. Musculoskeletal: No aggressive appearing focal osseous lesions. Marked thoracic spondylosis. Review of the MIP images confirms the above findings. IMPRESSION: 1. No convincing evidence of pulmonary embolism. Streaky indistinct areas of low-attenuation throughout the main, right and left pulmonary arteries, favor mixing artifact. 2. Small to moderate right and small left layering pleural effusions. Moderate dependent bibasilar atelectasis. 3. Mosaic attenuation throughout both lungs, as can be  seen with air trapping from small airways disease versus mosaic perfusion from pulmonary vascular disease. 4.  Aortic Atherosclerosis (ICD10-I70.0). Electronically Signed   By: Selinda DELENA Blue M.D.   On: 09/25/2023 10:54   DG Chest 1 View Result Date: 09/25/2023 CLINICAL DATA:  Shortness of breath and edema. EXAM: CHEST  1 VIEW COMPARISON:  AP Lat chest 02/06/2023 FINDINGS: Low inspiration on exam compared to the prior study. There are small pleural effusions. There is patchy consolidation or atelectasis in both lower lung fields. Superimposed chronic elevation right hemidiaphragm. There is a large hiatal hernia with stable mediastinum. Mild cardiomegaly without vascular prominence. There is aortic atherosclerosis. Degenerative change thoracic spine. No  new osseous findings. IMPRESSION: 1. Low inspiration. 2. Small pleural effusions. 3. Patchy consolidation or atelectasis in both lower lung fields. 4. Large hiatal hernia. 5. Aortic atherosclerosis. Electronically Signed   By: Francis Quam M.D.   On: 09/25/2023 07:53   CT ANGIO AO+BIFEM W & OR WO CONTRAST Result Date: 09/23/2023 CLINICAL DATA:  Lower extremity claudication with soft tissue ulceration EXAM: CT ANGIOGRAPHY OF ABDOMINAL AORTA WITH ILIOFEMORAL RUNOFF TECHNIQUE: Multidetector CT imaging of the abdomen, pelvis and lower extremities was performed using the standard protocol during bolus administration of intravenous contrast. Multiplanar CT image reconstructions and MIPs were obtained to evaluate the vascular anatomy. RADIATION DOSE REDUCTION: This exam was performed according to the departmental dose-optimization program which includes automated exposure control, adjustment of the mA and/or kV according to patient size and/or use of iterative reconstruction technique. CONTRAST:  OMNIPAQUE  IOHEXOL  350 MG/ML SOLN COMPARISON:  Prior PET-CT 07/02/2020 FINDINGS: VASCULAR Aorta: Normal caliber aorta without aneurysm, dissection, vasculitis or significant stenosis. Celiac: Patent without evidence of aneurysm, dissection, vasculitis or significant stenosis. SMA: Patent without evidence of aneurysm, dissection, vasculitis or significant stenosis. Renals: Both main renal arteries are patent without evidence of aneurysm, dissection, vasculitis, fibromuscular dysplasia or significant stenosis. Small accessory artery to the right kidney. IMA: Patent without evidence of aneurysm, dissection, vasculitis or significant stenosis. RIGHT Lower Extremity Inflow: Common, internal and external iliac arteries are patent without evidence of aneurysm, dissection, vasculitis or significant stenosis. Outflow: Common, superficial and profunda femoral arteries and the popliteal artery are patent without evidence of  aneurysm, dissection, vasculitis or significant stenosis. Runoff: Patent three vessel runoff to the ankle. LEFT Lower Extremity Inflow: Common, internal and external iliac arteries are patent without evidence of aneurysm, dissection, vasculitis or significant stenosis. Outflow: Common, superficial and profunda femoral arteries and the popliteal artery are patent without evidence of aneurysm, dissection, vasculitis or significant stenosis. Runoff: Patent three vessel runoff to the ankle. Veins: No focal venous abnormality. Review of the MIP images confirms the above findings. NON-VASCULAR Lower chest: Marked enlargement of the main pulmonary artery at 3.2 cm suggesting pulmonary arterial hypertension. Cardiomegaly with right heart enlargement. No pericardial effusion. Moderate right and trace left pleural effusions. Large sliding hiatal hernia. Hepatobiliary: No focal liver abnormality is seen. Status post cholecystectomy. No biliary dilatation. Pancreas: Unremarkable. No pancreatic ductal dilatation or surrounding inflammatory changes. Spleen: Normal in size without focal abnormality. Adrenals/Urinary Tract: Adrenal glands are unremarkable. Kidneys are normal, without renal calculi, focal lesion, or hydronephrosis. Bladder is unremarkable. Stomach/Bowel: Large volume of formed stool in the rectum with a rectal stool ball measuring at least 9 x 7 x 6 cm. There is mild rectal wall thickening. No evidence of bowel obstruction. Normal appendix. Abnormal thickening of a distal loop of ileum in the low anatomic pelvis immediately adjacent to the  large abscess collection. Lymphatic: No suspicious lymphadenopathy. Reproductive: Apparent postsurgical changes of hysterectomy. The uterus is no longer visualized although it was present on prior PET imaging from April of 2022. Other: Thick walled fluid and gas collection in the recto vesicular recess measures approximately 6.8 x 4.4 x 4.4 cm. Diffuse diastasis recti. Small fat  containing ventral abdominal hernia. 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 Inward rotation of the left lower extremity with severe degenerative arthritis. Marked reticulation of the subcutaneous fat affecting the body wall and bilateral lower extremities consistent with edema. IMPRESSION: VASCULAR 1. No evidence of hemodynamically significant arterial stenosis or occlusion. No evidence of significant peripheral arterial disease. 2. Trace aortic atherosclerotic vascular calcifications. Aortic Atherosclerosis (ICD10-I70.0). NON-VASCULAR 1. Thick walled fluid and gas collection in the recto vesicular recess within the pelvic cul-de-sac consistent with an abscess measuring 6.8 x 4.4 x 4.4 cm. There is an associated adjacent thick walled loop of small bowel which may represent chronic secondary inflammatory reaction if the source of the abscess is related to a prior hysterectomy, a source of chronic small bowel perforation, or potentially even lymphomatous involvement of the small bowel with contained perforation. 2. Extensive anasarca and bilateral lower extremity edema. Findings are favored to be related to elevated right heart pressures in the setting of pulmonary arterial hypertension. 3. Moderate right and trace left pleural effusions. 4. Moderate to large sliding hiatal hernia. 5. 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 6. Inward rotation of the left lower extremity with advanced knee joint degenerative osteoarthritis. These results will be called to the ordering clinician or representative by the Radiologist Assistant, and communication documented in the PACS or [employer]. Electronically Signed   By: Wilkie Lent M.D.   On: 09/23/2023 09:04   VAS US  LOWER EXTREMITY ARTERIAL DUPLEX Result Date: 09/07/2023 LOWER EXTREMITY ARTERIAL DUPLEX STUDY Patient Name:  WHITTLEY CARANDANG  Date of Exam:   09/07/2023 Medical Rec #: [id number]        Accession #:    [id number] Date of Birth: Jan 08, 1956       Patient Gender: F Patient Age:   31 years Exam Location:  [facility] Procedure:      VAS US  LOWER EXTREMITY ARTERIAL DUPLEX Referring Phys: CORDELLA SHAWL --------------------------------------------------------------------------------  Indications: Ulceration.  Current ABI: not done Limitations: Patient is unable to stand; exam was performed with patient upright              in a wheelchair Comparison Study: ABI on 06/29/23: Right=1.27(TBI=0.97) & Left=1.28(TBI=0.93) Performing Technologist: [name] Churn RT, RDMS, RVT  Examination Guidelines: A complete evaluation includes B-mode imaging, spectral Doppler, color Doppler, and power Doppler as needed of all accessible portions of each vessel. Bilateral testing is considered an integral part of a complete examination. Limited examinations for reoccurring indications may be performed as noted.  +-----------+--------+-----+--------+---------+-------------+ RIGHT      PSV cm/sRatioStenosisWaveform Comments      +-----------+--------+-----+--------+---------+-------------+ CFA Mid                                  not evaluated +-----------+--------+-----+--------+---------+-------------+ SFA Prox                                 not evaluated +-----------+--------+-----+--------+---------+-------------+ SFA Mid    77  biphasic               +-----------+--------+-----+--------+---------+-------------+ SFA Distal 73                   biphasic               +-----------+--------+-----+--------+---------+-------------+ POP Prox   70                   biphasic               +-----------+--------+-----+--------+---------+-------------+ POP Distal 64                   triphasic              +-----------+--------+-----+--------+---------+-------------+ ATA Distal 36                   biphasic                +-----------+--------+-----+--------+---------+-------------+ PTA Distal 62                   triphasic              +-----------+--------+-----+--------+---------+-------------+ PERO Distal30                   biphasic               +-----------+--------+-----+--------+---------+-------------+ Limited evaluation of the right lower extremity due to limited patient mobility.  +--------------+-------+-----------+--------+--------+-----+--------+ Left PoplitealAP (cm)Transv (cm)WaveformStenosisShapeComments +--------------+-------+-----------+--------+--------+-----+--------+ Distal                                                        +--------------+-------+-----------+--------+--------+-----+--------+  Summary: Right: Patent SFA, popliteal and tibial arteries without evidence of significant stenosis. No evidence of significant arterial occlusive disease in the proximal vessels, based on Doppler waveforms and previous ABI.  See table(s) above for measurements and observations. Electronically signed by Cordella Shawl MD on 09/07/2023 at 5:25:09 PM.   Final     ECHO as above  TELEMETRY reviewed by me 09/30/2023: Sinus rhythm, rate 70s  EKG reviewed by me: sinus tachycardia, rate 122 bpm with nonspecific ST-T wave changes.  Data reviewed by me 09/30/2023: last 24h vitals tele labs imaging I/O hospitalist progress notes, critical care notes.   Principal Problem:   Acute hypoxic respiratory failure (HCC) Active Problems:   Anxiety and depression   Chronic GERD   Hypothyroidism   Seizures (HCC)   Anemia   Diabetes mellitus without complication (HCC)   Pneumonia of both lungs due to infectious organism   UTI (urinary tract infection)   CHF exacerbation (HCC)   PVD (peripheral vascular disease) (HCC)   History of cerebral palsy    ASSESSMENT AND PLAN:  Michele James is a 68 y.o. female  with a past medical history of type 2 diabetes mellitus, GERD, hypothyroidism,  seizures, cerebral palsy, PVD who presented to the ED on 09/25/2023 for SOB, productive cough and bilateral lower extremity swelling. Patient admitted due to new onset CHF and CAP. On 06/29 rapid response called for acute hypoxic respiratory failure and AMS. Patient transferred to ICU and required intubation for airway protection. Cardiology was consulted for further evaluation of new onset HFrEF.  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 Patient intubated and sedated. Trops minimally elevated and flat 12 > 13 > 27 > 34. BNP elevated but improving 1400 > 960. EKG on 06/29 with sinus tachycardia, RVH,  rate 122 bpm, without acute ischemic changes.  CTA with no evidence of acute pulmonary embolism.  Echo this admission with new findings of reduced EF(35-40%), moderate to severe RV systolic dysfunction, severely elevated PASP.  Prior echo on 09/2022 with preserved EF, no evidence of RV dysfunction or pulmonary hypertension. Patient extubated ands weaned off levo on 07/01. BP improving.   -Decreased IV lasix  to 40 mg BID. Closely monitor renal function and UOP.  -Recommend goal MAP > 65. -Continue home atorvastatin  40 mg daily per tube.  -Plan to optimize GDMT if patient remains hemodynamically stable now off pressors. Likely slowly start resuming home medications tomorrow.  -Minimally elevated flat troponins in the setting of acute hypoxic respiratory failure, new onset moderate to severe RV dysfunction and acute HFrEF is most consistent with demand/supply ischemia and not ACS. -Plan to repeat echo after clinical status improves, likely do outpatient RHC for further evaluation of new echo findings this admission. -Consider outpatient ischemic evaluation. -Acute hypoxic respiratory failure,  sepsis with suspected aspiration pneumonia management per critical care team.   This patient's plan of care was discussed and created with Dr. Wilburn and he is in agreement.  Signed: Dorene Comfort, PA-C  09/30/2023, 12:07 PM [facility] Card[facility]

## 2023-10-01 ENCOUNTER — Inpatient Hospital Stay

## 2023-10-01 DIAGNOSIS — J9601 Acute respiratory failure with hypoxia: Secondary | ICD-10-CM | POA: Diagnosis not present

## 2023-10-01 LAB — BLOOD GAS, ARTERIAL
Acid-Base Excess: 13.9 mmol/L — ABNORMAL HIGH (ref 0.0–2.0)
Bicarbonate: 41.2 mmol/L — ABNORMAL HIGH (ref 20.0–28.0)
Delivery systems: POSITIVE
Expiratory PAP: 5 cmH2O
FIO2: 0.4 %
Inspiratory PAP: 12 cmH2O
O2 Saturation: 99.3 %
Patient temperature: 37
pCO2 arterial: 62 mmHg — ABNORMAL HIGH (ref 32–48)
pH, Arterial: 7.43 (ref 7.35–7.45)
pO2, Arterial: 122 mmHg — ABNORMAL HIGH (ref 83–108)

## 2023-10-01 LAB — BASIC METABOLIC PANEL WITH GFR
Anion gap: 10 (ref 5–15)
BUN: 15 mg/dL (ref 8–23)
CO2: 32 mmol/L (ref 22–32)
Calcium: 8.5 mg/dL — ABNORMAL LOW (ref 8.9–10.3)
Chloride: 99 mmol/L (ref 98–111)
Creatinine, Ser: 0.47 mg/dL (ref 0.44–1.00)
GFR, Estimated: >60 mL/min (ref >60)
Glucose, Bld: 120 mg/dL — ABNORMAL HIGH (ref 70–99)
Potassium: 3.6 mmol/L (ref 3.5–5.1)
Sodium: 141 mmol/L (ref 135–145)

## 2023-10-01 LAB — CBC
HCT: 36.8 % (ref 36.0–46.0)
Hemoglobin: 11.5 g/dL — ABNORMAL LOW (ref 12.0–15.0)
MCH: 31.5 pg (ref 26.0–34.0)
MCHC: 31.3 g/dL (ref 30.0–36.0)
MCV: 100.8 fL — ABNORMAL HIGH (ref 80.0–100.0)
Platelets: 236 10*3/uL (ref 150–400)
RBC: 3.65 MIL/uL — ABNORMAL LOW (ref 3.87–5.11)
RDW: 14 % (ref 11.5–15.5)
WBC: 8.1 10*3/uL (ref 4.0–10.5)
nRBC: 0 % (ref 0.0–0.2)

## 2023-10-01 LAB — BLOOD GAS, VENOUS
Acid-Base Excess: 13.5 mmol/L — ABNORMAL HIGH (ref 0.0–2.0)
Bicarbonate: 40.9 mmol/L — ABNORMAL HIGH (ref 20.0–28.0)
O2 Saturation: 97 %
Patient temperature: 37
pCO2, Ven: 63 mmHg — ABNORMAL HIGH (ref 44–60)
pH, Ven: 7.42 (ref 7.25–7.43)
pO2, Ven: 76 mmHg — ABNORMAL HIGH (ref 32–45)

## 2023-10-01 LAB — CULTURE, BLOOD (ROUTINE X 2)
Culture: NO GROWTH
Special Requests: ADEQUATE

## 2023-10-01 LAB — CULTURE, RESPIRATORY W GRAM STAIN: Culture: NO GROWTH

## 2023-10-01 LAB — GLUCOSE, CAPILLARY
Glucose-Capillary: 131 mg/dL — ABNORMAL HIGH (ref 70–99)
Glucose-Capillary: 101 mg/dL — ABNORMAL HIGH (ref 70–99)
Glucose-Capillary: 167 mg/dL — ABNORMAL HIGH (ref 70–99)
Glucose-Capillary: 108 mg/dL — ABNORMAL HIGH (ref 70–99)

## 2023-10-01 LAB — PHOSPHORUS: Phosphorus: 3 mg/dL (ref 2.5–4.6)

## 2023-10-01 LAB — MAGNESIUM: Magnesium: 2 mg/dL (ref 1.7–2.4)

## 2023-10-01 NOTE — Progress Notes (Signed)
 Eeg done

## 2023-10-01 NOTE — Progress Notes (Signed)
 [facility] CLINIC CARDIOLOGY PROGRESS NOTE       Patient ID: Michele James MRN: [id number] DOB/AGE: 66-Jan-1957 66 y.o.  Admit date: 09/25/2023 Referring Physician Dr. Malka Primary Physician Glover Lenis, MD Primary Cardiologist None Reason for Consultation New onset CHF with rEF  HPI: Michele James is a 68 y.o. female  with a past medical history of type 2 diabetes mellitus, GERD, hypothyroidism, seizures, cerebral palsy, PVD who presented to the ED on 09/25/2023 for SOB, productive cough and bilateral lower extremity swelling. Patient admitted due to new onset CHF and CAP. On 06/29 rapid response called for acute hypoxic respiratory failure and AMS. Patient transferred to ICU and required intubation for airway protection. Cardiology was consulted for further evaluation of new onset HFrEF.  Interval History: -Patient seen and examined this AM. Patient awake and responsive this AM.  -Patient extubated on 07/01 at 1020, and remains on HFNC 35. -Patient weaning off of levo gtt on 07/01 at 1700 and BP soft this AM. HR remains stable. -Overnight Tele showed no significant events.  -Yesterday UOP 3.9L. With stable renal function. -Electrolytes remain stable this AM.    Review of systems complete and found to be negative unless listed above    Past Medical History:  Diagnosis Date   Anemia    Arthritis    knees   Cerebral palsy (HCC)    Depression    Diabetes mellitus without complication (HCC)    Diverticulosis    Dysphagia    Eczema    GERD (gastroesophageal reflux disease)    Glaucoma    Hemorrhoids    Hyperlipidemia    Hypothyroidism    Seizures (HCC)    none in over 10 yrs    Past Surgical History:  Procedure Laterality Date   BREAST BIOPSY Right 03/22/2020   stereo bx, ribbon clip,  FAT NECROSIS AND FIBROSIS   CATARACT EXTRACTION W/PHACO Right 02/06/2016   Procedure: CATARACT EXTRACTION PHACO AND INTRAOCULAR LENS PLACEMENT (IOC);  Surgeon: Dene Etienne,  MD;  Location[facility];  Service: Ophthalmology;  Laterality: Right;   CATARACT EXTRACTION W/PHACO Left 03/05/2016   Procedure: CATARACT EXTRACTION PHACO AND INTRAOCULAR LENS PLACEMENT (IOC);  Surgeon: Dene Etienne, MD;  Location: [facility];  Service: Ophthalmology;  Laterality: Left;   CHOLECYSTECTOMY N/A 11/16/2015   Procedure: LAPAROSCOPIC CHOLECYSTECTOMY;  Surgeon: Elgin Laurence III, MD;  Location: [facility] ORS;  Service: General;  Laterality: N/A;  attempted cholangiogram   COLONOSCOPY WITH PROPOFOL  N/A 12/25/2014   Procedure: COLONOSCOPY WITH PROPOFOL ;  Surgeon: Deward CINDERELLA Piedmont, MD;  Location: [facility] ENDOSCOPY;  Service: Gastroenterology;  Laterality: N/A;   COLONOSCOPY WITH PROPOFOL  N/A 04/14/2019   Procedure: COLONOSCOPY WITH PROPOFOL ;  Surgeon: Toledo, Ladell POUR, MD;  Location: [facility] ENDOSCOPY;  Service: Gastroenterology;  Laterality: N/A;   COLONOSCOPY WITH PROPOFOL  N/A 04/20/2022   Procedure: COLONOSCOPY WITH PROPOFOL ;  Surgeon: Therisa Bi, MD;  Location: [facility] ENDOSCOPY;  Service: Gastroenterology;  Laterality: N/A;   ENDOSCOPIC RETROGRADE CHOLANGIOPANCREATOGRAPHY (ERCP) WITH PROPOFOL  N/A 11/18/2015   Procedure: ENDOSCOPIC RETROGRADE CHOLANGIOPANCREATOGRAPHY (ERCP) WITH PROPOFOL ;  Surgeon: Rogelia Copping, MD;  Location: [facility] ENDOSCOPY;  Service: Endoscopy;  Laterality: N/A;   ESOPHAGOGASTRODUODENOSCOPY (EGD) WITH PROPOFOL  N/A 04/14/2019   Procedure: ESOPHAGOGASTRODUODENOSCOPY (EGD) WITH PROPOFOL ;  Surgeon: Toledo, Ladell POUR, MD;  Location: [facility] ENDOSCOPY;  Service: Gastroenterology;  Laterality: N/A;   ESOPHAGOGASTRODUODENOSCOPY (EGD) WITH PROPOFOL  N/A 04/20/2022   Procedure: ESOPHAGOGASTRODUODENOSCOPY (EGD) WITH PROPOFOL ;  Surgeon: Therisa Bi, MD;  Location: [facility] ENDOSCOPY;  Service: Gastroenterology;  Laterality: N/A;  EYE SURGERY     Cataract Surgery    HERNIA REPAIR      Medications Prior to Admission  Medication Sig Dispense Refill Last Dose/Taking   acetaminophen   (TYLENOL ) 325 MG tablet Take 325 mg by mouth every 6 (six) hours as needed.   Unknown   alendronate  (FOSAMAX ) 70 MG tablet Take 70 mg by mouth once a week. Take with a full glass of water on an empty stomach.   Past Week   ALPRAZolam  (XANAX ) 0.25 MG tablet Take 0.25 mg by mouth as needed for anxiety. Before pap smears   Unknown   atorvastatin  (LIPITOR) 40 MG tablet Take 40 mg by mouth daily.   09/24/2023   calcium  citrate-vitamin D  (CITRACAL+D) 315-200 MG-UNIT per tablet Take 1 tablet by mouth 2 (two) times daily.   09/24/2023   carbamazepine  (TEGRETOL ) 200 MG tablet Take 200 mg by mouth 3 (three) times daily.   09/24/2023   Cholecalciferol  100 MCG (4000 UT) TABS Take 1,000 Units by mouth.   09/24/2023   esomeprazole (NEXIUM) 40 MG capsule Take 40 mg by mouth daily at 12 noon.   09/24/2023   famotidine  (PEPCID ) 20 MG tablet Take 20 mg by mouth 2 (two) times daily.   09/24/2023   hydroxypropyl methylcellulose / hypromellose (ISOPTO TEARS / GONIOVISC) 2.5 % ophthalmic solution 1 drop as needed for dry eyes.   Unknown   iron  polysaccharides (NIFEREX) 150 MG capsule Take 1 capsule (150 mg total) by mouth 2 (two) times daily for 30 days, THEN 1 capsule (150 mg total) daily. 150 capsule 0 09/24/2023   levothyroxine  (SYNTHROID ) 100 MCG tablet Take 100 mcg by mouth every morning.   09/24/2023   magnesium  oxide (MAG-OX) 400 (240 Mg) MG tablet Take 1 tablet by mouth 3 (three) times daily.   09/24/2023   naproxen (NAPROSYN) 375 MG tablet Take 375 mg by mouth 2 (two) times daily with a meal.   09/24/2023   OLANZapine  (ZYPREXA ) 10 MG tablet Take 1 tablet (10 mg total) by mouth at bedtime. (Patient taking differently: Take 10 mg by mouth at bedtime. Take 10 mg in addition to 15 mg tablet for total 25 mg at bedtime) 90 tablet 0 09/24/2023   OLANZapine  (ZYPREXA ) 15 MG tablet Take 1 tablet (15 mg total) by mouth at bedtime. (Patient taking differently: Take 15 mg by mouth at bedtime. Take 15 mg in addition to one 10 mg tablet  for total 25 mg at bedtime) 90 tablet 0 09/24/2023   OLANZapine  (ZYPREXA ) 5 MG tablet Take 1 tablet (5 mg total) by mouth daily. 90 tablet 0 09/24/2023 Morning   PARoxetine  (PAXIL ) 20 MG tablet TAKE 1 TABLET BY MOUTH EVERY DAY 90 tablet 1 09/24/2023   potassium chloride  20 MEQ/15ML (10%) SOLN Take 10 mEq by mouth daily.   09/24/2023   vitamin E 400 UNIT capsule Take 400 Units by mouth daily.   09/24/2023   Social History   Socioeconomic History   Marital status: Single    Spouse name: Not on file   Number of children: Not on file   Years of education: Not on file   Highest education level: Not on file  Occupational History   Not on file  Tobacco Use   Smoking status: Former    Current packs/day: 0.00    Types: Cigarettes    Quit date: 06/20/1980    Years since quitting: 43.3   Smokeless tobacco: Never   Tobacco comments:    smoked socially in early  1980s (reports for 4 months)  Vaping Use   Vaping status: Never Used  Substance and Sexual Activity   Alcohol  use: No   Drug use: No   Sexual activity: Not Currently  Other Topics Concern   Not on file  Social History Narrative   Not on file   Social Drivers of Health   Financial Resource Strain: Low Risk  (09/28/2023)   Overall Financial Resource Strain (CARDIA)    Difficulty of Paying Living Expenses: Not hard at all  Food Insecurity: No Food Insecurity (09/28/2023)   Hunger Vital Sign    Worried About Running Out of Food in the Last Year: Never true    Ran Out of Food in the Last Year: Never true  Transportation Needs: No Transportation Needs (09/28/2023)   PRAPARE - Administrator, Civil Service (Medical): No    Lack of Transportation (Non-Medical): No  Physical Activity: Not on file  Stress: Not on file  Social Connections: Unknown (09/26/2023)   Social Connection and Isolation Panel    Frequency of Communication with Friends and Family: Patient unable to answer    Frequency of Social Gatherings with Friends and  Family: Patient unable to answer    Attends Religious Services: Patient unable to answer    Active Member of Clubs or Organizations: Patient unable to answer    Attends Banker Meetings: Patient unable to answer    Marital Status: Never married  Intimate Partner Violence: Patient Unable To Answer (09/26/2023)   Humiliation, Afraid, Rape, and Kick questionnaire    Fear of Current or Ex-Partner: Patient unable to answer    Emotionally Abused: Patient unable to answer    Physically Abused: Patient unable to answer    Sexually Abused: Patient unable to answer    Family History  Problem Relation Age of Onset   High blood pressure Mother    Hyperlipidemia Mother    Diabetes Mellitus II Mother    Stroke Mother    Osteoporosis Mother    High blood pressure Father    Breast cancer Maternal Aunt    Stroke Maternal Grandfather    Diabetes Mellitus II Maternal Grandfather    Stroke Maternal Grandmother    Diabetes Mellitus II Maternal Grandmother      Vitals:   10/01/23 0700 10/01/23 0739 10/01/23 0800 10/01/23 0900  BP: (!) 94/58  110/61 113/72  Pulse: 81  87 87  Resp:   13 17  Temp: 97.8 F (36.6 C)     TempSrc: Axillary     SpO2: 96% 95% 98% 96%  Weight:      Height:        PHYSICAL EXAM General: Chronically ill-appearing elderly female, well nourished, in no acute distress. HEENT: Normocephalic and atraumatic. Neck: No JVD.   Lungs: Clear breath sounds bilaterally, normal effort. HFNC  Heart: HRRR. Normal S1 and S2 without gallops or murmurs.  Abdomen: Non-distended appearing.  Msk: Normal strength and tone for age. Extremities: Warm and well perfused. No clubbing, cyanosis. Trace pedal edema.  Labs: Basic Metabolic Panel: Recent Labs    09/30/23 0429 09/30/23 2005 10/01/23 0550  NA 137  --  141  K 3.8 3.6 3.6  CL 100  --  99  CO2 32  --  32  GLUCOSE 89  --  120*  BUN 11  --  15  CREATININE 0.33*  --  0.47  CALCIUM  7.9*  --  8.5*  MG 1.8 1.8 2.0   PHOS  2.8  --  3.0   Liver Function Tests: No results for input(s): AST, ALT, ALKPHOS, BILITOT, PROT, ALBUMIN in the last 72 hours.  No results for input(s): LIPASE, AMYLASE in the last 72 hours. CBC: Recent Labs    09/30/23 0429 10/01/23 0550  WBC 6.5 8.1  HGB 10.6* 11.5*  HCT 33.6* 36.8  MCV 99.7 100.8*  PLT 217 236   Cardiac Enzymes: No results for input(s): CKTOTAL, CKMB, CKMBINDEX, TROPONINIHS in the last 72 hours.  BNP: No results for input(s): BNP in the last 72 hours.  D-Dimer: No results for input(s): DDIMER in the last 72 hours. Hemoglobin A1C: No results for input(s): HGBA1C in the last 72 hours.  Fasting Lipid Panel: No results for input(s): CHOL, HDL, LDLCALC, TRIG, CHOLHDL, LDLDIRECT in the last 72 hours.  Thyroid  Function Tests: No results for input(s): TSH, T4TOTAL, T3FREE, THYROIDAB in the last 72 hours.  Invalid input(s): FREET3 Anemia Panel: No results for input(s): VITAMINB12, FOLATE, FERRITIN, TIBC, IRON , RETICCTPCT in the last 72 hours.   Radiology: [facility] Chest Port 1 View Result Date: 09/30/2023 CLINICAL DATA:  Shortness of breath. EXAM: PORTABLE CHEST 1 VIEW COMPARISON:  09/27/2023. FINDINGS: Stable left IJ CVC catheter tip at the superior cavoatrial junction. The heart size and mediastinal contours are within normal limits. Small bilateral layering pleural effusions with decreased patchy bibasilar opacities and improved aeration at the lung bases. No pneumothorax. The visualized skeletal structures are unchanged. IMPRESSION: Small bilateral pleural effusions with decreased patchy bibasilar opacities and improved aeration at the lung bases. Electronically Signed   By: Harrietta Sherry M.D.   On: 09/30/2023 08:36   ECHOCARDIOGRAM COMPLETE Result Date: 09/27/2023    ECHOCARDIOGRAM REPORT   Patient Name:   Michele James Date of Exam: 09/27/2023 Medical Rec #:  [id number]      Height:       63.0  in Accession #:    [id number]     Weight:       181.2 lb Date of Birth:  July 20, 1955      BSA:          1.854 m Patient Age:    68 years       BP:           104/56 mmHg Patient Gender: F              HR:           62 bpm. Exam Location:  [facility] Procedure: 2D Echo, Color Doppler, Cardiac Doppler and Strain Analysis (Both            Spectral and Color Flow Doppler were utilized during procedure). Indications:     I50.31 Acute Diastolic CHF  History:         Patient has prior history of Echocardiogram examinations. Risk                  Factors:Hypertension and Dyslipidemia.  Sonographer:     L. Thornton-Maynard Referring Phys:  [id number] CAMILA A THOMAS Diagnosing Phys: Redell Cave MD  Sonographer Comments: Echo performed with patient supine and on artificial respirator. Global longitudinal strain was attempted. IMPRESSIONS  1. Left ventricular ejection fraction, by estimation, is 35 to 40%. The left ventricle has moderately decreased function. The left ventricle demonstrates global hypokinesis. Left ventricular diastolic parameters are consistent with Grade I diastolic dysfunction (impaired relaxation). There is the interventricular septum is flattened in systole and diastole, consistent with right ventricular pressure and volume overload.  2.  Right ventricular systolic function is moderately reduced. The right ventricular size is mildly enlarged. There is severely elevated pulmonary artery systolic pressure. The estimated right ventricular systolic pressure is 61.1 mmHg.  3. Right atrial size was severely dilated.  4. The mitral valve is normal in structure. No evidence of mitral valve regurgitation.  5. The aortic valve is tricuspid. Aortic valve regurgitation is mild. Aortic valve sclerosis is present, with no evidence of aortic valve stenosis. FINDINGS  Left Ventricle: Left ventricular ejection fraction, by estimation, is 35 to 40%. The left ventricle has moderately decreased function. The left ventricle  demonstrates global hypokinesis. The left ventricular internal cavity size was normal in size. There is no left ventricular hypertrophy. The interventricular septum is flattened in systole and diastole, consistent with right ventricular pressure and volume overload. Left ventricular diastolic parameters are consistent with Grade I diastolic dysfunction (impaired relaxation). Right Ventricle: The right ventricular size is mildly enlarged. No increase in right ventricular wall thickness. Right ventricular systolic function is moderately reduced. There is severely elevated pulmonary artery systolic pressure. The tricuspid regurgitant velocity is 3.81 m/s, and with an assumed right atrial pressure of 3 mmHg, the estimated right ventricular systolic pressure is 61.1 mmHg. Left Atrium: Left atrial size was normal in size. Right Atrium: Right atrial size was severely dilated. Pericardium: There is no evidence of pericardial effusion. Mitral Valve: The mitral valve is normal in structure. No evidence of mitral valve regurgitation. MV peak gradient, 2.9 mmHg. The mean mitral valve gradient is 1.0 mmHg. Tricuspid Valve: The tricuspid valve is normal in structure. Tricuspid valve regurgitation is mild. Aortic Valve: The aortic valve is tricuspid. Aortic valve regurgitation is mild. Aortic valve sclerosis is present, with no evidence of aortic valve stenosis. Aortic valve mean gradient measures 2.0 mmHg. Aortic valve peak gradient measures 3.3 mmHg. Aortic valve area, by VTI measures 2.43 cm. Pulmonic Valve: The pulmonic valve was grossly normal. Pulmonic valve regurgitation is not visualized. Aorta: The aortic root and ascending aorta are structurally normal, with no evidence of dilitation. Venous: IVC assessment for right atrial pressure unable to be performed due to mechanical ventilation. IAS/Shunts: No atrial level shunt detected by color flow Doppler.  LEFT VENTRICLE PLAX 2D LVIDd:         3.70 cm     Diastology LVIDs:          2.90 cm     LV e' medial:    5.70 cm/s LV PW:         0.90 cm     LV E/e' medial:  7.6 LV IVS:        0.90 cm     LV e' lateral:   6.83 cm/s LVOT diam:     1.90 cm     LV E/e' lateral: 6.4 LV SV:         45 LV SV Index:   24 LVOT Area:     2.84 cm  LV Volumes (MOD) LV vol d, MOD A2C: 33.5 ml LV vol d, MOD A4C: 35.8 ml LV vol s, MOD A2C: 17.4 ml LV vol s, MOD A4C: 11.8 ml LV SV MOD A2C:     16.1 ml LV SV MOD A4C:     35.8 ml LV SV MOD BP:      20.5 ml RIGHT VENTRICLE            IVC RV Basal diam:  4.40 cm    IVC diam: 2.10 cm RV Mid diam:  2.90 cm RV S prime:     8.15 cm/s TAPSE (M-mode): 0.7 cm LEFT ATRIUM             Index        RIGHT ATRIUM           Index LA diam:        3.30 cm 1.78 cm/m   RA Area:     24.80 cm LA Vol (A2C):   32.5 ml 17.53 ml/m  RA Volume:   97.20 ml  52.42 ml/m LA Vol (A4C):   26.3 ml 14.18 ml/m LA Biplane Vol: 30.2 ml 16.29 ml/m  AORTIC VALVE                    PULMONIC VALVE AV Area (Vmax):    2.58 cm     PV Vmax:       0.62 m/s AV Area (Vmean):   2.21 cm     PV Peak grad:  1.6 mmHg AV Area (VTI):     2.43 cm AV Vmax:           90.85 cm/s AV Vmean:          63.650 cm/s AV VTI:            0.184 m AV Peak Grad:      3.3 mmHg AV Mean Grad:      2.0 mmHg LVOT Vmax:         82.70 cm/s LVOT Vmean:        49.600 cm/s LVOT VTI:          0.158 m LVOT/AV VTI ratio: 0.86  AORTA Ao Root diam: 3.30 cm Ao Asc diam:  3.20 cm MITRAL VALVE               TRICUSPID VALVE MV Area (PHT): 2.87 cm    TR Peak grad:   58.1 mmHg MV Area VTI:   2.02 cm    TR Vmax:        381.00 cm/s MV Peak grad:  2.9 mmHg MV Mean grad:  1.0 mmHg    SHUNTS MV Vmax:       0.85 m/s    Systemic VTI:  0.16 m MV Vmean:      42.8 cm/s   Systemic Diam: 1.90 cm MV Decel Time: 264 msec MV E velocity: 43.50 cm/s MV A velocity: 75.50 cm/s MV E/A ratio:  0.58 Redell Cave MD Electronically signed by Redell Cave MD Signature Date/Time: 09/27/2023/7:23:05 PM    Final    DG Abd 1 View Result Date:  09/27/2023 CLINICAL DATA:  [id number] Encounter for imaging study to confirm orogastric (OG) tube placement [id number] EXAM: ABDOMEN - 1 VIEW COMPARISON:  Earlier film of the same day, CT 09/22/2023 FINDINGS: Gastric tube overlies the medial left infrahilar chest and left upper abdomen corresponding to hiatal hernia seen on prior CT. The stomach is nondistended. visualized small bowel and colon normal. Lumbar levoscoliosis with multilevel spondylitic change. IMPRESSION: Gastric tube extends to the level of hiatal hernia. Electronically Signed   By: JONETTA Faes M.D.   On: 09/27/2023 10:30   DG Abd 1 View Result Date: 09/27/2023 CLINICAL DATA:  [id number]. Encounter for study to confirm nasogastric tube placement. EXAM: ABDOMEN - 1 VIEW COMPARISON:  PET-CT 07/02/2020, abdomen x-rays 10/21/2022 FINDINGS: There is A moderate sized hiatal hernia. NGT passes into the stomach below the diaphragm and is probably in the distal body of stomach. The bowel pattern is nonobstructive  with moderate retained stool. The pelvis was excluded from the exam. There is no semi supine evidence of free air. No other significant radiographic findings. Advanced degenerative change lumbar spine. IMPRESSION: 1. NGT passes into the stomach below the diaphragm and is probably in the distal body of stomach. 2. Moderate sized hiatal hernia. 3. Nonobstructive bowel pattern with moderate retained stool. Electronically Signed   By: Francis Quam M.D.   On: 09/27/2023 05:11   Portable Chest x-ray Result Date: 09/27/2023 CLINICAL DATA:  Central line placement EXAM: PORTABLE CHEST 1 VIEW COMPARISON:  Same day chest radiograph FINDINGS: Endotracheal tube tip in the mid intrathoracic trachea. Left IJ CVC tip in the right atrium. No pneumothorax. Similar patchy bilateral airspace opacities and layering pleural effusions. Stable cardiomediastinal silhouette. Aortic atherosclerotic calcification. IMPRESSION: 1. Left IJ CVC tip in the right atrium. No pneumothorax.  2. Similar patchy bilateral airspace opacities and layering pleural effusions. Electronically Signed   By: Norman Gatlin M.D.   On: 09/27/2023 02:10   DG Chest Port 1 View Result Date: 09/27/2023 CLINICAL DATA:  Endotracheal tube placement EXAM: PORTABLE CHEST 1 VIEW COMPARISON:  09/25/2023 FINDINGS: Endotracheal tube tip in the mid intrathoracic trachea. Low lung volumes. Stable cardiomediastinal silhouette. Aortic atherosclerotic calcification. Diffuse bronchial wall thickening compatible with bronchitis. Small bilateral pleural effusions and basilar airspace opacities. No pneumothorax. No displaced rib fracture. Remote left rib fractures. IMPRESSION: 1. Endotracheal tube tip in the mid intrathoracic trachea. 2. Small bilateral pleural effusions and basilar airspace opacities, atelectasis versus pneumonia. 3. Bronchitis. Electronically Signed   By: Norman Gatlin M.D.   On: 09/27/2023 00:51   CT Angio Chest PE W/Cm &/Or Wo Cm Result Date: 09/25/2023 CLINICAL DATA:  Pulmonary embolism (PE) suspected, high prob, dyspnea EXAM: CT ANGIOGRAPHY CHEST WITH CONTRAST TECHNIQUE: Multidetector CT imaging of the chest was performed using the standard protocol during bolus administration of intravenous contrast. Multiplanar CT image reconstructions and MIPs were obtained to evaluate the vascular anatomy. RADIATION DOSE REDUCTION: This exam was performed according to the departmental dose-optimization program which includes automated exposure control, adjustment of the mA and/or kV according to patient size and/or use of iterative reconstruction technique. CONTRAST:  75mL OMNIPAQUE  IOHEXOL  350 MG/ML SOLN COMPARISON:  Chest radiograph from earlier today. 10/18/2022 chest CT angiogram. FINDINGS: Cardiovascular: Streaky indistinct areas of low-attenuation throughout the main, right and left pulmonary arteries, favor mixing artifact. No convincing filling defects in the pulmonary arteries to suggest pulmonary embolism.  Atherosclerotic nonaneurysmal thoracic aorta. Normal caliber pulmonary arteries. Top-normal heart size. No significant pericardial fluid/thickening. Mediastinum/Nodes: No significant thyroid  nodules. Unremarkable esophagus. No pathologically enlarged axillary, mediastinal or hilar lymph nodes. Lungs/Pleura: No pneumothorax. Small to moderate right and small left layering pleural effusions. Moderate dependent bibasilar atelectasis. Mosaic attenuation throughout both lungs. Indistinct tiny 0.3 cm posterior right upper lobe solid pulmonary nodule on series 5/image 69, stable. No new significant pulmonary nodules in the aerated portions of the lungs. No acute consolidative airspace disease. Upper abdomen: No acute abnormality. Musculoskeletal: No aggressive appearing focal osseous lesions. Marked thoracic spondylosis. Review of the MIP images confirms the above findings. IMPRESSION: 1. No convincing evidence of pulmonary embolism. Streaky indistinct areas of low-attenuation throughout the main, right and left pulmonary arteries, favor mixing artifact. 2. Small to moderate right and small left layering pleural effusions. Moderate dependent bibasilar atelectasis. 3. Mosaic attenuation throughout both lungs, as can be seen with air trapping from small airways disease versus mosaic perfusion from pulmonary vascular disease. 4.  Aortic Atherosclerosis (ICD10-I70.0).  Electronically Signed   By: Selinda DELENA Blue M.D.   On: 09/25/2023 10:54   DG Chest 1 View Result Date: 09/25/2023 CLINICAL DATA:  Shortness of breath and edema. EXAM: CHEST  1 VIEW COMPARISON:  AP Lat chest 02/06/2023 FINDINGS: Low inspiration on exam compared to the prior study. There are small pleural effusions. There is patchy consolidation or atelectasis in both lower lung fields. Superimposed chronic elevation right hemidiaphragm. There is a large hiatal hernia with stable mediastinum. Mild cardiomegaly without vascular prominence. There is aortic  atherosclerosis. Degenerative change thoracic spine. No new osseous findings. IMPRESSION: 1. Low inspiration. 2. Small pleural effusions. 3. Patchy consolidation or atelectasis in both lower lung fields. 4. Large hiatal hernia. 5. Aortic atherosclerosis. Electronically Signed   By: Francis Quam M.D.   On: 09/25/2023 07:53   CT ANGIO AO+BIFEM W & OR WO CONTRAST Result Date: 09/23/2023 CLINICAL DATA:  Lower extremity claudication with soft tissue ulceration EXAM: CT ANGIOGRAPHY OF ABDOMINAL AORTA WITH ILIOFEMORAL RUNOFF TECHNIQUE: Multidetector CT imaging of the abdomen, pelvis and lower extremities was performed using the standard protocol during bolus administration of intravenous contrast. Multiplanar CT image reconstructions and MIPs were obtained to evaluate the vascular anatomy. RADIATION DOSE REDUCTION: This exam was performed according to the departmental dose-optimization program which includes automated exposure control, adjustment of the mA and/or kV according to patient size and/or use of iterative reconstruction technique. CONTRAST:  OMNIPAQUE  IOHEXOL  350 MG/ML SOLN COMPARISON:  Prior PET-CT 07/02/2020 FINDINGS: VASCULAR Aorta: Normal caliber aorta without aneurysm, dissection, vasculitis or significant stenosis. Celiac: Patent without evidence of aneurysm, dissection, vasculitis or significant stenosis. SMA: Patent without evidence of aneurysm, dissection, vasculitis or significant stenosis. Renals: Both main renal arteries are patent without evidence of aneurysm, dissection, vasculitis, fibromuscular dysplasia or significant stenosis. Small accessory artery to the right kidney. IMA: Patent without evidence of aneurysm, dissection, vasculitis or significant stenosis. RIGHT Lower Extremity Inflow: Common, internal and external iliac arteries are patent without evidence of aneurysm, dissection, vasculitis or significant stenosis. Outflow: Common, superficial and profunda femoral arteries and the  popliteal artery are patent without evidence of aneurysm, dissection, vasculitis or significant stenosis. Runoff: Patent three vessel runoff to the ankle. LEFT Lower Extremity Inflow: Common, internal and external iliac arteries are patent without evidence of aneurysm, dissection, vasculitis or significant stenosis. Outflow: Common, superficial and profunda femoral arteries and the popliteal artery are patent without evidence of aneurysm, dissection, vasculitis or significant stenosis. Runoff: Patent three vessel runoff to the ankle. Veins: No focal venous abnormality. Review of the MIP images confirms the above findings. NON-VASCULAR Lower chest: Marked enlargement of the main pulmonary artery at 3.2 cm suggesting pulmonary arterial hypertension. Cardiomegaly with right heart enlargement. No pericardial effusion. Moderate right and trace left pleural effusions. Large sliding hiatal hernia. Hepatobiliary: No focal liver abnormality is seen. Status post cholecystectomy. No biliary dilatation. Pancreas: Unremarkable. No pancreatic ductal dilatation or surrounding inflammatory changes. Spleen: Normal in size without focal abnormality. Adrenals/Urinary Tract: Adrenal glands are unremarkable. Kidneys are normal, without renal calculi, focal lesion, or hydronephrosis. Bladder is unremarkable. Stomach/Bowel: Large volume of formed stool in the rectum with a rectal stool ball measuring at least 9 x 7 x 6 cm. There is mild rectal wall thickening. No evidence of bowel obstruction. Normal appendix. Abnormal thickening of a distal loop of ileum in the low anatomic pelvis immediately adjacent to the large abscess collection. Lymphatic: No suspicious lymphadenopathy. Reproductive: Apparent postsurgical changes of hysterectomy. The uterus is no longer visualized although  it was present on prior PET imaging from April of 2022. Other: Thick walled fluid and gas collection in the recto vesicular recess measures approximately 6.8 x  4.4 x 4.4 cm. Diffuse diastasis recti. Small fat containing ventral abdominal hernia. 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 Inward rotation of the left lower extremity with severe degenerative arthritis. Marked reticulation of the subcutaneous fat affecting the body wall and bilateral lower extremities consistent with edema. IMPRESSION: VASCULAR 1. No evidence of hemodynamically significant arterial stenosis or occlusion. No evidence of significant peripheral arterial disease. 2. Trace aortic atherosclerotic vascular calcifications. Aortic Atherosclerosis (ICD10-I70.0). NON-VASCULAR 1. Thick walled fluid and gas collection in the recto vesicular recess within the pelvic cul-de-sac consistent with an abscess measuring 6.8 x 4.4 x 4.4 cm. There is an associated adjacent thick walled loop of small bowel which may represent chronic secondary inflammatory reaction if the source of the abscess is related to a prior hysterectomy, a source of chronic small bowel perforation, or potentially even lymphomatous involvement of the small bowel with contained perforation. 2. Extensive anasarca and bilateral lower extremity edema. Findings are favored to be related to elevated right heart pressures in the setting of pulmonary arterial hypertension. 3. Moderate right and trace left pleural effusions. 4. Moderate to large sliding hiatal hernia. 5. 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 6. Inward rotation of the left lower extremity with advanced knee joint degenerative osteoarthritis. These results will be called to the ordering clinician or representative by the Radiologist Assistant, and communication documented in the PACS or [employer]. Electronically Signed   By: Wilkie Lent M.D.   On: 09/23/2023 09:04   VAS US  LOWER EXTREMITY ARTERIAL DUPLEX Result Date: 09/07/2023 LOWER EXTREMITY ARTERIAL DUPLEX STUDY Patient Name:  DENEEN SLAGER  Date  of Exam:   09/07/2023 Medical Rec #: [id number]       Accession #:    [id number] Date of Birth: 1955-09-09       Patient Gender: F Patient Age:   22 years Exam Location:  [facility] Procedure:      VAS US  LOWER EXTREMITY ARTERIAL DUPLEX Referring Phys: CORDELLA SHAWL --------------------------------------------------------------------------------  Indications: Ulceration.  Current ABI: not done Limitations: Patient is unable to stand; exam was performed with patient upright              in a wheelchair Comparison Study: ABI on 06/29/23: Right=1.27(TBI=0.97) & Left=1.28(TBI=0.93) Performing Technologist: [name] Churn RT, RDMS, RVT  Examination Guidelines: A complete evaluation includes B-mode imaging, spectral Doppler, color Doppler, and power Doppler as needed of all accessible portions of each vessel. Bilateral testing is considered an integral part of a complete examination. Limited examinations for reoccurring indications may be performed as noted.  +-----------+--------+-----+--------+---------+-------------+ RIGHT      PSV cm/sRatioStenosisWaveform Comments      +-----------+--------+-----+--------+---------+-------------+ CFA Mid                                  not evaluated +-----------+--------+-----+--------+---------+-------------+ SFA Prox                                 not evaluated +-----------+--------+-----+--------+---------+-------------+ SFA Mid    77                   biphasic               +-----------+--------+-----+--------+---------+-------------+  SFA Distal 73                   biphasic               +-----------+--------+-----+--------+---------+-------------+ POP Prox   70                   biphasic               +-----------+--------+-----+--------+---------+-------------+ POP Distal 64                   triphasic              +-----------+--------+-----+--------+---------+-------------+ ATA Distal 36                    biphasic               +-----------+--------+-----+--------+---------+-------------+ PTA Distal 62                   triphasic              +-----------+--------+-----+--------+---------+-------------+ PERO Distal30                   biphasic               +-----------+--------+-----+--------+---------+-------------+ Limited evaluation of the right lower extremity due to limited patient mobility.  +--------------+-------+-----------+--------+--------+-----+--------+ Left PoplitealAP (cm)Transv (cm)WaveformStenosisShapeComments +--------------+-------+-----------+--------+--------+-----+--------+ Distal                                                        +--------------+-------+-----------+--------+--------+-----+--------+  Summary: Right: Patent SFA, popliteal and tibial arteries without evidence of significant stenosis. No evidence of significant arterial occlusive disease in the proximal vessels, based on Doppler waveforms and previous ABI.  See table(s) above for measurements and observations. Electronically signed by Cordella Shawl MD on 09/07/2023 at 5:25:09 PM.   Final     ECHO as above  TELEMETRY reviewed by me 10/01/2023: Sinus rhythm, rate 80s  EKG reviewed by me: sinus tachycardia, rate 122 bpm with nonspecific ST-T wave changes.  Data reviewed by me 10/01/2023: last 24h vitals tele labs imaging I/O hospitalist progress notes, critical care notes.   Principal Problem:   Acute hypoxic respiratory failure (HCC) Active Problems:   Anxiety and depression   Chronic GERD   Hypothyroidism   Seizures (HCC)   Anemia   Diabetes mellitus without complication (HCC)   Pneumonia of both lungs due to infectious organism   UTI (urinary tract infection)   CHF exacerbation (HCC)   PVD (peripheral vascular disease) (HCC)   History of cerebral palsy    ASSESSMENT AND PLAN:  Michele James is a 68 y.o. female  with a past medical history of type 2 diabetes mellitus,  GERD, hypothyroidism, seizures, cerebral palsy, PVD who presented to the ED on 09/25/2023 for SOB, productive cough and bilateral lower extremity swelling. Patient admitted due to new onset CHF and CAP. On 06/29 rapid response called for acute hypoxic respiratory failure and AMS. Patient transferred to ICU and required intubation for airway protection. Cardiology was consulted for further evaluation of new onset HFrEF.  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 Patient intubated and sedated. Trops minimally elevated and flat 12 > 13 > 27 > 34. BNP elevated but improving 1400 > 960. EKG on 06/29 with sinus tachycardia, RVH,  rate 122 bpm, without acute ischemic changes.  CTA with no evidence of acute pulmonary embolism.  Echo this admission with new findings of reduced EF(35-40%), moderate to severe RV systolic dysfunction, severely elevated PASP.  Prior echo on 09/2022 with preserved EF, no evidence of RV dysfunction or pulmonary hypertension. Patient extubated ands weaned off levo on 07/01. BP improving.   -Continue IV lasix  to 40 mg BID. Closely monitor renal function and UOP.  -Recommend goal MAP > 65. -Continue home atorvastatin  40 mg daily per tube.  -Hold home losartan due to low BP. -Plan to optimize GDMT if patient remains hemodynamically stable now off pressors. Will slowly start resuming home medications as BP allows. -Minimally elevated flat troponins in the setting of acute hypoxic respiratory failure, new onset moderate to severe RV dysfunction and acute HFrEF is most consistent with demand/supply ischemia and not ACS. -Plan to repeat echo after clinical status improves at outpatient follow-up, likely do outpatient RHC for further evaluation of new echo findings this  admission. -Consider outpatient ischemic evaluation. -Acute hypoxic respiratory failure, sepsis with suspected aspiration pneumonia management per critical care team.   This patient's plan of care was discussed and created with Dr. Wilburn and he is in agreement.  Signed: Dorene Comfort, PA-C  10/01/2023, 10:01 AM [facility]

## 2023-10-01 NOTE — Progress Notes (Signed)
 Physical Therapy Treatment Patient Details Name: Michele James MRN: [id number] DOB: 01-19-1956 Today's Date: 10/01/2023   History of Present Illness Pt is a 68 y.o. female presenting with bilateral lower extremity swelling as well as shortness of breath. Acute hypoxic respiratory failure in the setting of Acute decompensated HFrEF, sepsis due to suspected Aspiration Pneumonia and Klebsiella Pneumoniae/Enterococcus Faecalis UTI, and Acute Metabolic Encephalopathy requiring intubation and mechanical ventilation. PMH: cerebral palsy, wheelchair-bound, history of GERD, hypothyroidism, seizures, diabetes    PT Comments  Patient is agreeable to PT session. She continues to require assistance with bed mobility. Assistance required initially to maintain sitting balance. Vitals stable throughout session on HHFNC. Anticipate patient will require continued physical assistance with mobility after this hospital stay. If she does not have adequate caregiver support, may need rehabilitation < 3 hours/day. PT will continue to follow.    If plan is discharge home, recommend the following: A lot of help with walking and/or transfers   Can travel by private vehicle        Equipment Recommendations   (to be determined)    Recommendations for Other Services       Precautions / Restrictions Precautions Precautions: Fall Recall of Precautions/Restrictions: Impaired Restrictions Weight Bearing Restrictions Per Provider Order: No     Mobility  Bed Mobility Overal bed mobility: Needs Assistance Bed Mobility: Supine to Sit, Sit to Supine     Supine to sit: Max assist Sit to supine: Max assist   General bed mobility comments: assistance for LE and trunk support. cues for technique. increased time and effort required with mobility.    Transfers                   General transfer comment: not attempted, fatigued with activity    Ambulation/Gait                   Stairs              Wheelchair Mobility     Tilt Bed    Modified Rankin (Stroke Patients Only)       Balance Overall balance assessment: Needs assistance Sitting-balance support: Single extremity supported, Feet unsupported Sitting balance-Leahy Scale: Poor Sitting balance - Comments: up to Min A required to maintain sitting balance with periods of stand by assistance                                    Communication Communication Communication: No apparent difficulties  Cognition Arousal: Alert Behavior During Therapy: WFL for tasks assessed/performed   PT - Cognitive impairments: No family/caregiver present to determine baseline                       PT - Cognition Comments: occasional cues for attention to task. tangential and distractable Following commands: Impaired Following commands impaired: Follows one step commands inconsistently, Follows one step commands with increased time    Cueing Cueing Techniques: Gestural cues, Verbal cues  Exercises      General Comments General comments (skin integrity, edema, etc.): HHFNC weaned down during session by the nurse. Sp02 97% while sitting and 91-92% in the bed      Pertinent Vitals/Pain Pain Assessment Pain Assessment: No/denies pain    Home Living  Prior Function            PT Goals (current goals can now be found in the care plan section) Acute Rehab PT Goals Patient Stated Goal: none stated PT Goal Formulation: Patient unable to participate in goal setting Time For Goal Achievement: 10/14/23 Potential to Achieve Goals: Fair Progress towards PT goals: Progressing toward goals    Frequency    Min 2X/week      PT Plan      Co-evaluation              AM-PAC PT 6 Clicks Mobility   Outcome Measure  Help needed turning from your back to your side while in a flat bed without using bedrails?: A Lot Help needed moving from lying on your back to  sitting on the side of a flat bed without using bedrails?: A Lot Help needed moving to and from a bed to a chair (including a wheelchair)?: Total Help needed standing up from a chair using your arms (e.g., wheelchair or bedside chair)?: Total Help needed to walk in hospital room?: Total Help needed climbing 3-5 steps with a railing? : Total 6 Click Score: 8    End of Session Equipment Utilized During Treatment: Oxygen Activity Tolerance: Patient limited by fatigue Patient left: in bed;with call bell/phone within reach;with bed alarm set Nurse Communication: Mobility status PT Visit Diagnosis: Other abnormalities of gait and mobility (R26.89);Difficulty in walking, not elsewhere classified (R26.2);Muscle weakness (generalized) (M62.81)     Time: 9079-9064 PT Time Calculation (min) (ACUTE ONLY): 15 min  Charges:    $Therapeutic Activity: 8-22 mins PT General Charges $$ ACUTE PT VISIT: 1 Visit                     Randine Essex, PT, MPT    Randine LULLA Essex 10/01/2023, 10:51 AM

## 2023-10-01 NOTE — Progress Notes (Addendum)
 PROGRESS NOTE    Michele James  FMW:[id number] DOB: 1955/06/30 DOA: 09/25/2023 PCP: Glover Lenis, MD    Brief Narrative:   68 year old female with multiple medical problems admitted with acute hypoxic respiratory failure in the setting of Acute decompensated HFrEF, sepsis due to suspected Aspiration Pneumonia and Klebsiella Pneumoniae/Enterococcus Faecalis UTI, and Acute Metabolic Encephalopathy requiring intubation and mechanical ventilation.  Per ED notes, EMS noted sats 90% on room air and she was placed on 2 L nasal cannula.  6/27:Admit to [facility] service with new onset CHF and CAP 6/28: Stable on 5L 6/29: Rapid response called for Acute hypoxic respiratory Failure and altered mental status. Transferred to ICU, patient unresponsive with agonal respirations and increased work of breathing. Intubated emergently for airway protection. 7/1: Extubated to [facility]. 7/2: No significant events noted overnight, tolerating extubation well.  Afebrile, hemodynamically stable, weaned off vasopressors.  Creatinine remains stable, UOP 5.6 L last 24 hours (net - 10.4 L), decrease Lasix  to 40 mg BID.  Consult PT/OT.  Assessment & Plan:   Principal Problem:   Acute hypoxic respiratory failure (HCC) Active Problems:   CHF exacerbation (HCC)   UTI (urinary tract infection)   Pneumonia of both lungs due to infectious organism   Diabetes mellitus without complication (HCC)   Anemia   Hypothyroidism   PVD (peripheral vascular disease) (HCC)   Seizures (HCC)   Chronic GERD   Anxiety and depression   History of cerebral palsy   Aphasia following other cerebrovascular disease   Schizophreniform disorder (HCC)  # Acute hypoxic respiratory failure 2/2 pna, chf. Intubated initially, now weaned to hfnc, now on bipap - continue bipap, wean as able - f/u abg, cxr  # Acute encephalopathy This morning. Resolved spontaneously - check eeg given seizure disorder - f/u abg  # HFrEF with acute  exacerbation # Demand ischemia Cardiology following. EF 35-40 with moderate RV dysfunction and pulmonary hypertension. Diuresing well, out close to 4 liters yesterday. Trops elevated, cardiology thinks demand - continue diuresis per cardiology - strict I/os - maintain foley for now  # Sepsis  Resolved  # Aspiration pneumonia Resolving - continue unasyn  - f/u cxr  # Klebsiella and enterococcus uti - continue unasyn   # Fluid collection Incidental on 6/24 CT angio ordered by fascular, in the recto vesicular recess consistent with abscess - will discuss with gen surg or IR  # T2DM  Eugolycemic - continue ssi  # Cerebral palsy With contractures, bedbound at baseline  # Schizophrenia - cont home zyprexa , xanax , tegretol , paroxetine   # Hypothyroid - home levothyroxine    DVT prophylaxis: lovenox  Code Status: full Family Communication: none at bedside  Level of care: Stepdown Status is: Inpatient Remains inpatient appropriate because: severity of illness    Consultants:  Pccm has signed off cardiology  Procedures: Intubation, extubation  Antimicrobials:  See above    Subjective: No complaints except doesn't like bipap mask  Objective: Vitals:   10/01/23 0700 10/01/23 0739 10/01/23 0800 10/01/23 0900  BP: (!) 94/58  110/61 113/72  Pulse: 81  87 87  Resp:   13 17  Temp: 97.8 F (36.6 C)     TempSrc: Axillary     SpO2: 96% 95% 98% 96%  Weight:      Height:        Intake/Output Summary (Last 24 hours) at 10/01/2023 1119 Last data filed at 10/01/2023 0859 Gross per 24 hour  Intake 1619.25 ml  Output 2915 ml  Net -1295.75 ml  Filed Weights   09/29/23 0500 09/30/23 0500 10/01/23 0500  Weight: 67.4 kg 70.2 kg 71.1 kg    Examination:  General exam: Appears calm and comfortable  Respiratory system: Clear to auscultation save for rales at bases Cardiovascular system: S1 & S2 heard, RRR.   Gastrointestinal system: Abdomen is nondistended, soft and  nontender.  . Central nervous system: Alert and oriented to self, place Extremities: contractures Skin: No visible rashes, lesions or ulcers Psychiatry: calm   Data Reviewed: I have personally reviewed following labs and imaging studies  CBC: Recent Labs  Lab 09/25/23 0823 09/26/23 0338 09/27/23 0213 09/28/23 0406 09/29/23 0447 09/30/23 0429 10/01/23 0550  WBC 5.9   < > 9.4 7.2 7.2 6.5 8.1  NEUTROABS 4.0  --   --   --   --   --   --   HGB 11.9*   < > 11.6* 11.6* 11.3* 10.6* 11.5*  HCT 37.9   < > 35.4* 35.0* 34.7* 33.6* 36.8  MCV 99.0   < > 97.3 95.1 97.5 99.7 100.8*  PLT 276   < > 235 283 251 217 236   < > = values in this interval not displayed.   Basic Metabolic Panel: Recent Labs  Lab 09/28/23 1945 09/29/23 0447 09/29/23 1337 09/29/23 1940 09/30/23 0429 09/30/23 2005 10/01/23 0550  NA  --  139 140 132* 137  --  141  K 2.7* 3.8 3.3* 4.2 3.8 3.6 3.6  CL  --  99 97* 93* 100  --  99  CO2  --  30 32 29 32  --  32  GLUCOSE  --  108* 118* 92 89  --  120*  BUN  --  11 9 11 11   --  15  CREATININE  --  0.39* 0.40* 0.45 0.33*  --  0.47  CALCIUM   --  7.8* 8.1* 7.8* 7.9*  --  8.5*  MG 1.5* 2.1  --   --  1.8 1.8 2.0  PHOS 3.2 2.8  --   --  2.8  --  3.0   GFR: Estimated Creatinine Clearance: 65.4 mL/min (by C-G formula based on SCr of 0.47 mg/dL). Liver Function Tests: Recent Labs  Lab 09/25/23 0823 09/26/23 0338 09/27/23 0213  AST 23 23 24   ALT 33 26 27  ALKPHOS 85 80 79  BILITOT 0.8 0.9 0.8  PROT 6.2* 5.8* 5.9*  ALBUMIN 3.2* 2.9* 3.1*   No results for input(s): LIPASE, AMYLASE in the last 168 hours. No results for input(s): AMMONIA in the last 168 hours. Coagulation Profile: No results for input(s): INR, PROTIME in the last 168 hours. Cardiac Enzymes: No results for input(s): CKTOTAL, CKMB, CKMBINDEX, TROPONINI in the last 168 hours. BNP (last 3 results) No results for input(s): PROBNP in the last 8760 hours. HbA1C: No results for  input(s): HGBA1C in the last 72 hours. CBG: Recent Labs  Lab 09/30/23 0744 09/30/23 1130 09/30/23 1629 09/30/23 2114 10/01/23 0751  GLUCAP 81 93 141* 131* 131*   Lipid Profile: No results for input(s): CHOL, HDL, LDLCALC, TRIG, CHOLHDL, LDLDIRECT in the last 72 hours. Thyroid  Function Tests: No results for input(s): TSH, T4TOTAL, FREET4, T3FREE, THYROIDAB in the last 72 hours. Anemia Panel: No results for input(s): VITAMINB12, FOLATE, FERRITIN, TIBC, IRON , RETICCTPCT in the last 72 hours. Urine analysis:    Component Value Date/Time   COLORURINE AMBER (A) 09/25/2023 0955   APPEARANCEUR HAZY (A) 09/25/2023 0955   LABSPEC 1.029 09/25/2023 0955   PHURINE 5.0  09/25/2023 0955   GLUCOSEU NEGATIVE 09/25/2023 0955   HGBUR NEGATIVE 09/25/2023 0955   BILIRUBINUR NEGATIVE 09/25/2023 0955   KETONESUR NEGATIVE 09/25/2023 0955   PROTEINUR 100 (A) 09/25/2023 0955   NITRITE NEGATIVE 09/25/2023 0955   LEUKOCYTESUR MODERATE (A) 09/25/2023 0955   Sepsis Labs: @LABRCNTIP (procalcitonin:4,lacticidven:4)  ) Recent Results (from the past 240 hours)  Resp panel by RT-PCR (RSV, Flu A&B, Covid) Anterior Nasal Swab     Status: None   Collection Time: 09/25/23  7:52 AM   Specimen: Anterior Nasal Swab  Result Value Ref Range Status   SARS Coronavirus 2 by RT PCR NEGATIVE NEGATIVE Final    Comment: (NOTE) SARS-CoV-2 target nucleic acids are NOT DETECTED.  The SARS-CoV-2 RNA is generally detectable in upper respiratory specimens during the acute phase of infection. The lowest concentration of SARS-CoV-2 viral copies this assay can detect is 138 copies/mL. A negative result does not preclude SARS-Cov-2 infection and should not be used as the sole basis for treatment or other patient management decisions. A negative result may occur with  improper specimen collection/handling, submission of specimen other than nasopharyngeal swab, presence of viral mutation(s)  within the areas targeted by this assay, and inadequate number of viral copies(<138 copies/mL). A negative result must be combined with clinical observations, patient history, and epidemiological information. The expected result is Negative.  Fact Sheet for Patients:  BloggerCourse.com  Fact Sheet for Healthcare Providers:  SeriousBroker.it  This test is no t yet approved or cleared by the [place]  FDA and  has been authorized for detection and/or diagnosis of SARS-CoV-2 by FDA under an Emergency Use Authorization (EUA). This EUA will remain  in effect (meaning this test can be used) for the duration of the COVID-19 declaration under Section 564(b)(1) of the Act, 21 U.S.C.section 360bbb-3(b)(1), unless the authorization is terminated  or revoked sooner.       Influenza A by PCR NEGATIVE NEGATIVE Final   Influenza B by PCR NEGATIVE NEGATIVE Final    Comment: (NOTE) The Xpert Xpress SARS-CoV-2/FLU/RSV plus assay is intended as an aid in the diagnosis of influenza from Nasopharyngeal swab specimens and should not be used as a sole basis for treatment. Nasal washings and aspirates are unacceptable for Xpert Xpress SARS-CoV-2/FLU/RSV testing.  Fact Sheet for Patients: BloggerCourse.com  Fact Sheet for Healthcare Providers: SeriousBroker.it  This test is not yet approved or cleared by the [place]  FDA and has been authorized for detection and/or diagnosis of SARS-CoV-2 by FDA under an Emergency Use Authorization (EUA). This EUA will remain in effect (meaning this test can be used) for the duration of the COVID-19 declaration under Section 564(b)(1) of the Act, 21 U.S.C. section 360bbb-3(b)(1), unless the authorization is terminated or revoked.     Resp Syncytial Virus by PCR NEGATIVE NEGATIVE Final    Comment: (NOTE) Fact Sheet for  Patients: BloggerCourse.com  Fact Sheet for Healthcare Providers: SeriousBroker.it  This test is not yet approved or cleared by the [place]  FDA and has been authorized for detection and/or diagnosis of SARS-CoV-2 by FDA under an Emergency Use Authorization (EUA). This EUA will remain in effect (meaning this test can be used) for the duration of the COVID-19 declaration under Section 564(b)(1) of the Act, 21 U.S.C. section 360bbb-3(b)(1), unless the authorization is terminated or revoked.  Performed at [facility], [address], [place], [place] [address]   Urine Culture     Status: Abnormal   Collection Time: 09/25/23  9:55 AM   Specimen:  Urine, Random  Result Value Ref Range Status   Specimen Description   Final    URINE, RANDOM Performed at [facility], [address] [place], [place] [address]    Special Requests   Final    NONE Reflexed from [id number] Performed at [facility], [address] [place], [place] [address]    Culture (A)  Final    80,000 COLONIES/mL KLEBSIELLA PNEUMONIAE >=100,000 COLONIES/mL ENTEROCOCCUS FAECALIS    Report Status 09/27/2023 FINAL  Final   Organism ID, Bacteria KLEBSIELLA PNEUMONIAE (A)  Final   Organism ID, Bacteria ENTEROCOCCUS FAECALIS (A)  Final      Susceptibility   Enterococcus faecalis - MIC*    AMPICILLIN  <=2 SENSITIVE Sensitive     NITROFURANTOIN  <=16 SENSITIVE Sensitive     VANCOMYCIN 1 SENSITIVE Sensitive     * >=100,000 COLONIES/mL ENTEROCOCCUS FAECALIS   Klebsiella pneumoniae - MIC*    AMPICILLIN  >=32 RESISTANT Resistant     CEFAZOLIN <=4 SENSITIVE Sensitive     CEFEPIME <=0.12 SENSITIVE Sensitive     CEFTRIAXONE  <=0.25 SENSITIVE Sensitive     CIPROFLOXACIN 0.5 INTERMEDIATE Intermediate     GENTAMICIN >=16 RESISTANT Resistant     IMIPENEM <=0.25 SENSITIVE Sensitive     NITROFURANTOIN  128 RESISTANT Resistant     TRIMETH/SULFA <=20  SENSITIVE Sensitive     AMPICILLIN /SULBACTAM 8 SENSITIVE Sensitive     PIP/TAZO <=4 SENSITIVE Sensitive ug/mL    * 80,000 COLONIES/mL KLEBSIELLA PNEUMONIAE  Culture, blood (routine x 2) Call MD if unable to obtain prior to antibiotics being given     Status: None   Collection Time: 09/25/23  3:26 PM   Specimen: BLOOD  Result Value Ref Range Status   Specimen Description BLOOD BLOOD RIGHT HAND  Final   Special Requests   Final    BOTTLES DRAWN AEROBIC ONLY Blood Culture results may not be optimal due to an inadequate volume of blood received in culture bottles   Culture   Final    NO GROWTH 5 DAYS Performed at [facility], [address] [place], [place] [address]    Report Status 09/30/2023 FINAL  Final  Culture, blood (routine x 2) Call MD if unable to obtain prior to antibiotics being given     Status: None   Collection Time: 09/26/23  3:36 AM   Specimen: BLOOD  Result Value Ref Range Status   Specimen Description BLOOD RIGHT ARM  Final   Special Requests   Final    BOTTLES DRAWN AEROBIC AND ANAEROBIC Blood Culture adequate volume   Culture   Final    NO GROWTH 5 DAYS Performed at [facility], [address], [place], [place] [address]    Report Status 10/01/2023 FINAL  Final  MRSA Next Gen by PCR, Nasal     Status: None   Collection Time: 09/27/23  2:06 AM   Specimen: Nasal Mucosa; Nasal Swab  Result Value Ref Range Status   MRSA by PCR Next Gen NOT DETECTED NOT DETECTED Final    Comment: (NOTE) The GeneXpert MRSA Assay (FDA approved for NASAL specimens only), is one component of a comprehensive MRSA colonization surveillance program. It is not intended to diagnose MRSA infection nor to guide or monitor treatment for MRSA infections. Test performance is not FDA approved in patients less than 71 years old. Performed at [facility], [address] [place], [place] [address]   Respiratory (~20 pathogens) panel by PCR     Status: None  Collection Time: 09/27/23  4:12 AM   Specimen: Nasopharyngeal Swab; Respiratory  Result Value Ref Range Status   Adenovirus NOT DETECTED NOT DETECTED Final   Coronavirus 229E NOT DETECTED NOT DETECTED Final    Comment: (NOTE) The Coronavirus on the Respiratory Panel, DOES NOT test for the novel  Coronavirus (2019 nCoV)    Coronavirus HKU1 NOT DETECTED NOT DETECTED Final   Coronavirus NL63 NOT DETECTED NOT DETECTED Final   Coronavirus OC43 NOT DETECTED NOT DETECTED Final   Metapneumovirus NOT DETECTED NOT DETECTED Final   Rhinovirus / Enterovirus NOT DETECTED NOT DETECTED Final   Influenza A NOT DETECTED NOT DETECTED Final   Influenza B NOT DETECTED NOT DETECTED Final   Parainfluenza Virus 1 NOT DETECTED NOT DETECTED Final   Parainfluenza Virus 2 NOT DETECTED NOT DETECTED Final   Parainfluenza Virus 3 NOT DETECTED NOT DETECTED Final   Parainfluenza Virus 4 NOT DETECTED NOT DETECTED Final   Respiratory Syncytial Virus NOT DETECTED NOT DETECTED Final   Bordetella pertussis NOT DETECTED NOT DETECTED Final   Bordetella Parapertussis NOT DETECTED NOT DETECTED Final   Chlamydophila pneumoniae NOT DETECTED NOT DETECTED Final   Mycoplasma pneumoniae NOT DETECTED NOT DETECTED Final    Comment: Performed at [facility], [address], [place], [place] [address]  Culture, Respiratory w Gram Stain     Status: None   Collection Time: 09/28/23  3:04 PM   Specimen: Tracheal Aspirate; Respiratory  Result Value Ref Range Status   Specimen Description   Final    TRACHEAL ASPIRATE Performed at [facility], [address] [place], [place] [address]    Special Requests   Final    NONE Performed at [facility], [address] [place], [place] [address]    Gram Stain   Final    RARE WBC PRESENT, PREDOMINANTLY PMN NO ORGANISMS SEEN    Culture   Final    NO GROWTH 2 DAYS Performed at [facility], [address] [place], [place] [address]    Report Status  10/01/2023 FINAL  Final         Radiology Studies: DG Chest Port 1 View Result Date: 10/01/2023 CLINICAL DATA:  Hypoxia EXAM: PORTABLE CHEST 1 VIEW COMPARISON:  X-ray 09/30/2023 FINDINGS: Hyperinflation. Slightly elevated right hemidiaphragm. Decreasing basilar atelectasis. Tiny pleural effusion. Kyphotic x-ray. Limited evaluation of the apices. No consolidation or edema. Previous left IJ catheter no longer identified. No definite pneumothorax. Known hiatal hernia. IMPRESSION: Decreasing basilar atelectasis. Removal of left IJ catheter. No obvious pneumothorax. Electronically Signed   By: Ranell Bring M.D.   On: 10/01/2023 11:12   DG Chest Port 1 View Result Date: 09/30/2023 CLINICAL DATA:  Shortness of breath. EXAM: PORTABLE CHEST 1 VIEW COMPARISON:  09/27/2023. FINDINGS: Stable left IJ CVC catheter tip at the superior cavoatrial junction. The heart size and mediastinal contours are within normal limits. Small bilateral layering pleural effusions with decreased patchy bibasilar opacities and improved aeration at the lung bases. No pneumothorax. The visualized skeletal structures are unchanged. IMPRESSION: Small bilateral pleural effusions with decreased patchy bibasilar opacities and improved aeration at the lung bases. Electronically Signed   By: Harrietta Sherry M.D.   On: 09/30/2023 08:36        Scheduled Meds:  atorvastatin   40 mg Per Tube Daily   carbamazepine   200 mg Per Tube TID   Chlorhexidine Gluconate Cloth  6 each Topical Daily   feeding supplement  237 mL Oral TID BM  furosemide   40 mg Intravenous BID   heparin   5,000 Units Subcutaneous Q8H   insulin aspart  0-5 Units Subcutaneous QHS   insulin aspart  0-9 Units Subcutaneous TID WC   iron  polysaccharides  150 mg Per Tube Daily   levothyroxine   100 mcg Oral Q0600   multivitamin with minerals  1 tablet Oral Daily   OLANZapine   10 mg Oral Daily   And   OLANZapine   15 mg Oral QHS   pantoprazole   40 mg Oral Daily    PARoxetine   20 mg Oral Daily   Continuous Infusions:  ampicillin -sulbactam (UNASYN ) IV 3 g (10/01/23 0557)     LOS: 6 days   CRITICAL CARE Performed by: Devaughn KATHEE Ban   Total critical care time: 62 minutes  Critical care time was exclusive of separately billable procedures and treating other patients.  Critical care was necessary to treat or prevent imminent or life-threatening deterioration.  Critical care was time spent personally by me on the following activities: development of treatment plan with patient and/or surrogate as well as nursing, discussions with consultants, evaluation of patient's response to treatment, examination of patient, obtaining history from patient or surrogate, ordering and performing treatments and interventions, ordering and review of laboratory studies, ordering and review of radiographic studies, pulse oximetry and re-evaluation of patient's condition.   Devaughn KATHEE Ban, MD [facility]   If 7PM-7AM, please contact night-coverage [email or web address] Password [email or web address] 10/01/2023, 11:19 AM

## 2023-10-02 ENCOUNTER — Inpatient Hospital Stay

## 2023-10-02 DIAGNOSIS — Z515 Encounter for palliative care: Secondary | ICD-10-CM

## 2023-10-02 DIAGNOSIS — I69820 Aphasia following other cerebrovascular disease: Secondary | ICD-10-CM

## 2023-10-02 DIAGNOSIS — J9601 Acute respiratory failure with hypoxia: Secondary | ICD-10-CM | POA: Diagnosis not present

## 2023-10-02 DIAGNOSIS — I509 Heart failure, unspecified: Secondary | ICD-10-CM | POA: Diagnosis not present

## 2023-10-02 LAB — CBC
HCT: 39.1 % (ref 36.0–46.0)
Hemoglobin: 11.8 g/dL — ABNORMAL LOW (ref 12.0–15.0)
MCH: 31 pg (ref 26.0–34.0)
MCHC: 30.2 g/dL (ref 30.0–36.0)
MCV: 102.6 fL — ABNORMAL HIGH (ref 80.0–100.0)
Platelets: 239 K/uL (ref 150–400)
RBC: 3.81 MIL/uL — ABNORMAL LOW (ref 3.87–5.11)
RDW: 13.8 % (ref 11.5–15.5)
WBC: 9.2 K/uL (ref 4.0–10.5)
nRBC: 0 % (ref 0.0–0.2)

## 2023-10-02 LAB — BASIC METABOLIC PANEL WITH GFR
Anion gap: 9 (ref 5–15)
BUN: 16 mg/dL (ref 8–23)
CO2: 36 mmol/L — ABNORMAL HIGH (ref 22–32)
Calcium: 8.7 mg/dL — ABNORMAL LOW (ref 8.9–10.3)
Chloride: 93 mmol/L — ABNORMAL LOW (ref 98–111)
Creatinine, Ser: 0.41 mg/dL — ABNORMAL LOW (ref 0.44–1.00)
GFR, Estimated: >60 mL/min (ref >60)
Glucose, Bld: 126 mg/dL — ABNORMAL HIGH (ref 70–99)
Potassium: 3.8 mmol/L (ref 3.5–5.1)
Sodium: 138 mmol/L (ref 135–145)

## 2023-10-02 LAB — GLUCOSE, CAPILLARY
Glucose-Capillary: 94 mg/dL (ref 70–99)
Glucose-Capillary: 181 mg/dL — ABNORMAL HIGH (ref 70–99)
Glucose-Capillary: 149 mg/dL — ABNORMAL HIGH (ref 70–99)
Glucose-Capillary: 102 mg/dL — ABNORMAL HIGH (ref 70–99)

## 2023-10-02 LAB — MAGNESIUM: Magnesium: 1.9 mg/dL (ref 1.7–2.4)

## 2023-10-02 LAB — PHOSPHORUS: Phosphorus: 4 mg/dL (ref 2.5–4.6)

## 2023-10-02 MED ORDER — IOHEXOL 300 MG/ML  SOLN
100.0000 mL | Freq: Once | INTRAMUSCULAR | Status: AC | PRN
  Administered 2023-10-02: 100 mL via INTRAVENOUS

## 2023-10-02 NOTE — Progress Notes (Signed)
 [facility] Cardiology    SUBJECTIVE: Patient extubated resting in bed asleep but easily arousable answering questions states she has the hiccups but shortness of breath is improved no pain no fever   Vitals:   10/02/23 0400 10/02/23 0500 10/02/23 0600 10/02/23 0730  BP: (!) 107/59  121/63   Pulse: (!) 107  96   Resp: (!) 22  (!) 22   Temp: 97.8 F (36.6 C)     TempSrc: Axillary     SpO2: 96%  94% 90%  Weight:  69.4 kg    Height:         Intake/Output Summary (Last 24 hours) at 10/02/2023 0810 Last data filed at 10/02/2023 0700 Gross per 24 hour  Intake 1190 ml  Output 3235 ml  Net -2045 ml      PHYSICAL EXAM  General: Well developed, well nourished, in no acute distress HEENT:  Normocephalic and atramatic Neck:  No JVD.  Lungs: Clear bilaterally to auscultation and percussion. Heart: HRRR . Normal S1 and S2 without gallops or murmurs.  Abdomen: Bowel sounds are positive, abdomen soft and non-tender  Msk:  Back normal, normal gait. Normal strength and tone for age. Extremities: No clubbing, cyanosis or edema.  Contractures Neuro: Alert and oriented X 3. Psych:  Good affect, responds appropriately to because of chronic schizophrenia condition and cerebral palsy   LABS: Basic Metabolic Panel: Recent Labs    10/01/23 0550 10/02/23 0340  NA 141 138  K 3.6 3.8  CL 99 93*  CO2 32 36*  GLUCOSE 120* 126*  BUN 15 16  CREATININE 0.47 0.41*  CALCIUM  8.5* 8.7*  MG 2.0 1.9  PHOS 3.0 4.0   Liver Function Tests: No results for input(s): AST, ALT, ALKPHOS, BILITOT, PROT, ALBUMIN in the last 72 hours. No results for input(s): LIPASE, AMYLASE in the last 72 hours. CBC: Recent Labs    10/01/23 0550 10/02/23 0340  WBC 8.1 9.2  HGB 11.5* 11.8*  HCT 36.8 39.1  MCV 100.8* 102.6*  PLT 236 239   Cardiac Enzymes: No results for input(s): CKTOTAL, CKMB, CKMBINDEX, TROPONINI in the last 72 hours. BNP: Invalid input(s): POCBNP D-Dimer: No results for  input(s): DDIMER in the last 72 hours. Hemoglobin A1C: No results for input(s): HGBA1C in the last 72 hours. Fasting Lipid Panel: No results for input(s): CHOL, HDL, LDLCALC, TRIG, CHOLHDL, LDLDIRECT in the last 72 hours. Thyroid  Function Tests: No results for input(s): TSH, T4TOTAL, T3FREE, THYROIDAB in the last 72 hours.  Invalid input(s): FREET3 Anemia Panel: No results for input(s): VITAMINB12, FOLATE, FERRITIN, TIBC, IRON , RETICCTPCT in the last 72 hours.  DG Chest Port 1 View Result Date: 10/01/2023 CLINICAL DATA:  Hypoxia EXAM: PORTABLE CHEST 1 VIEW COMPARISON:  X-ray 09/30/2023 FINDINGS: Hyperinflation. Slightly elevated right hemidiaphragm. Decreasing basilar atelectasis. Tiny pleural effusion. Kyphotic x-ray. Limited evaluation of the apices. No consolidation or edema. Previous left IJ catheter no longer identified. No definite pneumothorax. Known hiatal hernia. IMPRESSION: Decreasing basilar atelectasis. Removal of left IJ catheter. No obvious pneumothorax. Electronically Signed   By: Ranell Bring M.D.   On: 10/01/2023 11:12   DG Chest Port 1 View Result Date: 09/30/2023 CLINICAL DATA:  Shortness of breath. EXAM: PORTABLE CHEST 1 VIEW COMPARISON:  09/27/2023. FINDINGS: Stable left IJ CVC catheter tip at the superior cavoatrial junction. The heart size and mediastinal contours are within normal limits. Small bilateral layering pleural effusions with decreased patchy bibasilar opacities and improved aeration at the lung bases. No pneumothorax. The visualized skeletal  structures are unchanged. IMPRESSION: Small bilateral pleural effusions with decreased patchy bibasilar opacities and improved aeration at the lung bases. Electronically Signed   By: Harrietta Sherry M.D.   On: 09/30/2023 08:36     Echo mild reduced left ventricular function pulmonary hypertension EF around 40%  TELEMETRY: Normal sinus rhythm 90:  ASSESSMENT AND PLAN:  Principal  Problem:   Acute hypoxic respiratory failure (HCC) Active Problems:   Anxiety and depression   Chronic GERD   Hypothyroidism   Seizures (HCC)   Anemia   Diabetes mellitus without complication (HCC)   Aphasia following other cerebrovascular disease   Pneumonia of both lungs due to infectious organism   Schizophreniform disorder (HCC)   UTI (urinary tract infection)   CHF exacerbation (HCC)   PVD (peripheral vascular disease) (HCC)   History of cerebral palsy   Plan Acute hypoxic respiratory failure since extubated continue supplemental oxygen inhalers as necessary Sepsis related to pneumonia continue broad-spectrum antibiotic therapy Cardiomyopathy mildly reduced EF around 40% continue GDMT Severe pulm hypertension which appears to be a recent finding continue current therapy consider pulmonary input Elevated troponin probably related to demand ischemia recommend conservative management   Cara JONETTA Lovelace, MD 10/02/2023 8:10 AM

## 2023-10-02 NOTE — Consult Note (Signed)
 Consultation Note Date: 10/02/2023   Patient Name: Michele James  DOB: 09-May-1955  MRN: [id number]  Age / Sex: 68 y.o., female  PCP: Glover Lenis, MD Referring Physician: Kandis Devaughn Sayres, MD  Reason for Consultation: Establishing goals of care   HPI/Brief Hospital Course: 68 y.o. female  with past medical history of cerebral palsy, schizophrenia, aphasia, seizure disorder, iron  deficiency anemia due to chronic blood loss, T2DM, hypothyroidism, HLD admitted from home on 09/25/2023 with acute hypoxic respiratory failure due to acute decompensated HFrEF, sepsis secondary to aspiration PNA and UTI.  Required mechanical ventilation 6/29 due to worsening of acute hypoxic respiratory failure, successfully extubated 7/1-remains on HHFNC  Echo from this admission revealed EF 35-40%, moderate to severe RV systolic dysfunction and severely elevated PASP--diuresing with IV lasix , plan to repeat echo once stabilized and potential RHC as outpatient  Palliative medicine was consulted for assisting with goals of care conversations.  Subjective:  Extensive chart review has been completed prior to meeting patient including labs, vital signs, imaging, progress notes, orders, and available advanced directive documents from current and previous encounters.  Visited with [name] at her bedside. She is awake, alert and able to engage in conversation. She is oriented to person and time, poor awareness of situation. At this time, [name] not able to participate in goals of care conversations. No family or visitors at bedside during time of visit.  Introduced myself as a Publishing rights manager as a member of the palliative care team. Explained palliative medicine is specialized medical care for people living with serious illness. It focuses on providing relief from the symptoms and stress of a serious illness. The goal is to improve quality of life for both the patient and the  family.   Assessed symptoms. [name] shares she is feeling well today, denies acute pain or discomfort. Resting well at night, no change in appetite.  [name] shares she lives at home with her elderly mother, father and aunt. They have a private care aid that comes into the home each day that assists each family member out of bed into their wheelchairs. The care aid returns at night and assists them with getting back to bed. [name] shares she spends all of the day in her wheelchair, she has a Purewick at home and requires personal care with hygiene, bathing and all ADL's,. [name] shares they rely on meals on wheels and frozen dinners for their meals. She shares her mother and father transport and accompany her to her doctor appointments.  During our conversations, [name] expresses concern related to having her wound dressings changed on her legs and requests her bone medication. She repeats these requests during our conversation. Assured [name] nursing staff would be in soon to assist with dressing changes. Reviewed PTA medications, assured her restarting Fosamax  would be addressed.  Called and spoke with father-[name], plan set to meet at bedside tomorrow AM for goals of care discussions.  All questions/concerns addressed. Emotional support provided to patient/family/support persons. PMT will continue to follow and support patient as needed.   Objective: Primary Diagnoses: Present on Admission:  Hypothyroidism  Anemia  Chronic GERD  Acute hypoxic respiratory failure (HCC)  Pneumonia of both lungs due to infectious organism  UTI (urinary tract infection)  Anxiety and depression  Schizophreniform disorder (HCC)   Physical Exam Constitutional:      General: She is in acute distress.     Appearance: She is ill-appearing.  Cardiovascular:  Rate and Rhythm: Normal rate and regular rhythm.  Pulmonary:     Effort: Pulmonary effort is normal. No respiratory  distress.  Abdominal:     General: There is no distension.     Tenderness: There is no abdominal tenderness.  Musculoskeletal:     Comments: Left arm contracture  Neurological:     Mental Status: She is alert.     Comments: Oriented to person and place  Psychiatric:        Mood and Affect: Mood is anxious.        Speech: Speech is delayed.     Vital Signs: BP (!) 104/52   Pulse 99   Temp 97.8 F (36.6 C) (Axillary)   Resp 14   Ht 5' 3 (1.6 m)   Wt 69.4 kg   SpO2 (!) 89%   BMI 27.10 kg/m  Pain Scale: 0-10   Pain Score: 0-No pain  IO: Intake/output summary:  Intake/Output Summary (Last 24 hours) at 10/02/2023 1452 Last data filed at 10/02/2023 1300 Gross per 24 hour  Intake 739 ml  Output 2260 ml  Net -1521 ml    LBM: Last BM Date : 10/01/23 Baseline Weight: Weight: 82.2 kg Most recent weight: Weight: 69.4 kg       Palliative Assessment/Data:   Assessment and Plan  SUMMARY OF RECOMMENDATIONS   Family meeting 7/5 AM PMT to continue to follow for ongoing needs and support  Palliative Prophylaxis:   Bowel Regimen, Delirium Protocol and Frequent Pain Assessment   Discussed With: Nursing staff   Thank you for this consult and allowing Palliative Medicine to participate in the care of Michele James. Palliative medicine will continue to follow and assist as needed.   Time Total: 55 minutes  Time spent includes: Detailed review of medical records (labs, imaging, vital signs), medically appropriate exam (mental status, respiratory, cardiac, skin), discussed with treatment team, counseling and educating patient, family and staff, documenting clinical information, medication management and coordination of care.   Signed by: Waddell Lesches, DNP, AGNP-C Palliative Medicine    Please contact Palliative Medicine Team phone at [phone] for questions and concerns.  For individual provider: See Tracey

## 2023-10-02 NOTE — Progress Notes (Signed)
 PROGRESS NOTE    Michele James  FMW:[id number] DOB: May 05, 1955 DOA: 09/25/2023 PCP: Glover Lenis, MD    Brief Narrative:   68 year old female with multiple medical problems admitted with acute hypoxic respiratory failure in the setting of Acute decompensated HFrEF, sepsis due to suspected Aspiration Pneumonia and Klebsiella Pneumoniae/Enterococcus Faecalis UTI, and Acute Metabolic Encephalopathy requiring intubation and mechanical ventilation.  Per ED notes, EMS noted sats 90% on room air and she was placed on 2 L nasal cannula.  6/27:Admit to [facility] service with new onset CHF and CAP 6/28: Stable on 5L 6/29: Rapid response called for Acute hypoxic respiratory Failure and altered mental status. Transferred to ICU, patient unresponsive with agonal respirations and increased work of breathing. Intubated emergently for airway protection. 7/1: Extubated to [facility]. 7/2: No significant events noted overnight, tolerating extubation well.  Afebrile, hemodynamically stable, weaned off vasopressors.  Creatinine remains stable, UOP 5.6 L last 24 hours (net - 10.4 L), decrease Lasix  to 40 mg BID.  Consult PT/OT.  Assessment & Plan:   Principal Problem:   Acute hypoxic respiratory failure (HCC) Active Problems:   CHF exacerbation (HCC)   UTI (urinary tract infection)   Pneumonia of both lungs due to infectious organism   Diabetes mellitus without complication (HCC)   Anemia   Hypothyroidism   PVD (peripheral vascular disease) (HCC)   Seizures (HCC)   Chronic GERD   Anxiety and depression   History of cerebral palsy   Aphasia following other cerebrovascular disease   Schizophreniform disorder (HCC)  # Acute hypoxic respiratory failure 2/2 pna, chf. Intubated initially, now weaned to hfnc, tolerating - continue hfnc, wean as able - watch for hypercarbia  # Acute encephalopathy Resolved, likely 2/2 hypercarbia - f/u eeg given seizure disorder  # HFrEF with acute exacerbation # Demand  ischemia Cardiology following. EF 35-40 with moderate RV dysfunction and pulmonary hypertension. Diuresing well, out 3.2 liters yesterday. Trops elevated, cardiology thinks demand - continue diuresis per cardiology - strict I/os - maintain foley for now  # Sepsis  Resolved  # Aspiration pneumonia Resolving. CXR improving. - continue unasyn  for 5 day course  # Klebsiella and enterococcus uti - continue unasyn  as above  # Fluid collection Incidental on 6/24 CT angio ordered by fascular, in the recto vesicular recess consistent with abscess - will discuss with gen surg today  # T2DM  Eugolycemic - continue ssi  # Cerebral palsy With contractures, bedbound at baseline  # Schizophrenia - cont home zyprexa , xanax , tegretol , paroxetine   # Hypothyroid - home levothyroxine    DVT prophylaxis: lovenox  Code Status: full Family Communication: father [name] updated telephonically 7/4  Level of care: Stepdown Status is: Inpatient Remains inpatient appropriate because: severity of illness    Consultants:  Pccm has signed off cardiology  Procedures: Intubation, extubation  Antimicrobials:  See above    Subjective: No complaints, says breathing is comfortable  Objective: Vitals:   10/02/23 0730 10/02/23 0800 10/02/23 0900 10/02/23 1100  BP:  (!) 116/54 (!) 106/53 (!) 104/52  Pulse:  (!) 101 95 99  Resp:  (!) 25 16 14   Temp:      TempSrc:      SpO2: 90% 90% (!) 89% 94%  Weight:      Height:        Intake/Output Summary (Last 24 hours) at 10/02/2023 1143 Last data filed at 10/02/2023 0700 Gross per 24 hour  Intake 599 ml  Output 3235 ml  Net -2636 ml  Filed Weights   09/30/23 0500 10/01/23 0500 10/02/23 0500  Weight: 70.2 kg 71.1 kg 69.4 kg    Examination:  General exam: Appears calm and comfortable  Respiratory system: Clear to auscultation save for rales at bases Cardiovascular system: S1 & S2 heard, RRR.   Gastrointestinal system: Abdomen is  nondistended, soft and nontender.  . Central nervous system: Alert and oriented to self, place Extremities: contractures Skin: No visible rashes, lesions or ulcers Psychiatry: calm   Data Reviewed: I have personally reviewed following labs and imaging studies  CBC: Recent Labs  Lab 09/28/23 0406 09/29/23 0447 09/30/23 0429 10/01/23 0550 10/02/23 0340  WBC 7.2 7.2 6.5 8.1 9.2  HGB 11.6* 11.3* 10.6* 11.5* 11.8*  HCT 35.0* 34.7* 33.6* 36.8 39.1  MCV 95.1 97.5 99.7 100.8* 102.6*  PLT 283 251 217 236 239   Basic Metabolic Panel: Recent Labs  Lab 09/28/23 1945 09/29/23 0447 09/29/23 1337 09/29/23 1940 09/30/23 0429 09/30/23 2005 10/01/23 0550 10/02/23 0340  NA  --  139 140 132* 137  --  141 138  K 2.7* 3.8 3.3* 4.2 3.8 3.6 3.6 3.8  CL  --  99 97* 93* 100  --  99 93*  CO2  --  30 32 29 32  --  32 36*  GLUCOSE  --  108* 118* 92 89  --  120* 126*  BUN  --  11 9 11 11   --  15 16  CREATININE  --  0.39* 0.40* 0.45 0.33*  --  0.47 0.41*  CALCIUM   --  7.8* 8.1* 7.8* 7.9*  --  8.5* 8.7*  MG 1.5* 2.1  --   --  1.8 1.8 2.0 1.9  PHOS 3.2 2.8  --   --  2.8  --  3.0 4.0   GFR: Estimated Creatinine Clearance: 64.6 mL/min (A) (by C-G formula based on SCr of 0.41 mg/dL (L)). Liver Function Tests: Recent Labs  Lab 09/26/23 0338 09/27/23 0213  AST 23 24  ALT 26 27  ALKPHOS 80 79  BILITOT 0.9 0.8  PROT 5.8* 5.9*  ALBUMIN 2.9* 3.1*   No results for input(s): LIPASE, AMYLASE in the last 168 hours. No results for input(s): AMMONIA in the last 168 hours. Coagulation Profile: No results for input(s): INR, PROTIME in the last 168 hours. Cardiac Enzymes: No results for input(s): CKTOTAL, CKMB, CKMBINDEX, TROPONINI in the last 168 hours. BNP (last 3 results) No results for input(s): PROBNP in the last 8760 hours. HbA1C: No results for input(s): HGBA1C in the last 72 hours. CBG: Recent Labs  Lab 10/01/23 1207 10/01/23 1657 10/01/23 2124 10/02/23 0733  10/02/23 1134  GLUCAP 101* 167* 108* 94 181*   Lipid Profile: No results for input(s): CHOL, HDL, LDLCALC, TRIG, CHOLHDL, LDLDIRECT in the last 72 hours. Thyroid  Function Tests: No results for input(s): TSH, T4TOTAL, FREET4, T3FREE, THYROIDAB in the last 72 hours. Anemia Panel: No results for input(s): VITAMINB12, FOLATE, FERRITIN, TIBC, IRON , RETICCTPCT in the last 72 hours. Urine analysis:    Component Value Date/Time   COLORURINE AMBER (A) 09/25/2023 0955   APPEARANCEUR HAZY (A) 09/25/2023 0955   LABSPEC 1.029 09/25/2023 0955   PHURINE 5.0 09/25/2023 0955   GLUCOSEU NEGATIVE 09/25/2023 0955   HGBUR NEGATIVE 09/25/2023 0955   BILIRUBINUR NEGATIVE 09/25/2023 0955   KETONESUR NEGATIVE 09/25/2023 0955   PROTEINUR 100 (A) 09/25/2023 0955   NITRITE NEGATIVE 09/25/2023 0955   LEUKOCYTESUR MODERATE (A) 09/25/2023 0955   Sepsis Labs: @LABRCNTIP (procalcitonin:4,lacticidven:4)  ) Recent Results (  from the past 240 hours)  Resp panel by RT-PCR (RSV, Flu A&B, Covid) Anterior Nasal Swab     Status: None   Collection Time: 09/25/23  7:52 AM   Specimen: Anterior Nasal Swab  Result Value Ref Range Status   SARS Coronavirus 2 by RT PCR NEGATIVE NEGATIVE Final    Comment: (NOTE) SARS-CoV-2 target nucleic acids are NOT DETECTED.  The SARS-CoV-2 RNA is generally detectable in upper respiratory specimens during the acute phase of infection. The lowest concentration of SARS-CoV-2 viral copies this assay can detect is 138 copies/mL. A negative result does not preclude SARS-Cov-2 infection and should not be used as the sole basis for treatment or other patient management decisions. A negative result may occur with  improper specimen collection/handling, submission of specimen other than nasopharyngeal swab, presence of viral mutation(s) within the areas targeted by this assay, and inadequate number of viral copies(<138 copies/mL). A negative result must be  combined with clinical observations, patient history, and epidemiological information. The expected result is Negative.  Fact Sheet for Patients:  BloggerCourse.com  Fact Sheet for Healthcare Providers:  SeriousBroker.it  This test is no t yet approved or cleared by the [place]  FDA and  has been authorized for detection and/or diagnosis of SARS-CoV-2 by FDA under an Emergency Use Authorization (EUA). This EUA will remain  in effect (meaning this test can be used) for the duration of the COVID-19 declaration under Section 564(b)(1) of the Act, 21 U.S.C.section 360bbb-3(b)(1), unless the authorization is terminated  or revoked sooner.       Influenza A by PCR NEGATIVE NEGATIVE Final   Influenza B by PCR NEGATIVE NEGATIVE Final    Comment: (NOTE) The Xpert Xpress SARS-CoV-2/FLU/RSV plus assay is intended as an aid in the diagnosis of influenza from Nasopharyngeal swab specimens and should not be used as a sole basis for treatment. Nasal washings and aspirates are unacceptable for Xpert Xpress SARS-CoV-2/FLU/RSV testing.  Fact Sheet for Patients: BloggerCourse.com  Fact Sheet for Healthcare Providers: SeriousBroker.it  This test is not yet approved or cleared by the [place]  FDA and has been authorized for detection and/or diagnosis of SARS-CoV-2 by FDA under an Emergency Use Authorization (EUA). This EUA will remain in effect (meaning this test can be used) for the duration of the COVID-19 declaration under Section 564(b)(1) of the Act, 21 U.S.C. section 360bbb-3(b)(1), unless the authorization is terminated or revoked.     Resp Syncytial Virus by PCR NEGATIVE NEGATIVE Final    Comment: (NOTE) Fact Sheet for Patients: BloggerCourse.com  Fact Sheet for Healthcare Providers: SeriousBroker.it  This test is not yet  approved or cleared by the [place]  FDA and has been authorized for detection and/or diagnosis of SARS-CoV-2 by FDA under an Emergency Use Authorization (EUA). This EUA will remain in effect (meaning this test can be used) for the duration of the COVID-19 declaration under Section 564(b)(1) of the Act, 21 U.S.C. section 360bbb-3(b)(1), unless the authorization is terminated or revoked.  Performed at [facility], [address], [place], [place] [address]   Urine Culture     Status: Abnormal   Collection Time: 09/25/23  9:55 AM   Specimen: Urine, Random  Result Value Ref Range Status   Specimen Description   Final    URINE, RANDOM Performed at [facility], [address], [place], [place] [address]    Special Requests   Final    NONE Reflexed from [id number] Performed at [facility], [address] [place], [place] [address]    Culture (A)  Final    80,000 COLONIES/mL KLEBSIELLA PNEUMONIAE >=100,000 COLONIES/mL ENTEROCOCCUS FAECALIS    Report Status 09/27/2023 FINAL  Final   Organism ID, Bacteria KLEBSIELLA PNEUMONIAE (A)  Final   Organism ID, Bacteria ENTEROCOCCUS FAECALIS (A)  Final      Susceptibility   Enterococcus faecalis - MIC*    AMPICILLIN  <=2 SENSITIVE Sensitive     NITROFURANTOIN  <=16 SENSITIVE Sensitive     VANCOMYCIN 1 SENSITIVE Sensitive     * >=100,000 COLONIES/mL ENTEROCOCCUS FAECALIS   Klebsiella pneumoniae - MIC*    AMPICILLIN  >=32 RESISTANT Resistant     CEFAZOLIN <=4 SENSITIVE Sensitive     CEFEPIME <=0.12 SENSITIVE Sensitive     CEFTRIAXONE  <=0.25 SENSITIVE Sensitive     CIPROFLOXACIN 0.5 INTERMEDIATE Intermediate     GENTAMICIN >=16 RESISTANT Resistant     IMIPENEM <=0.25 SENSITIVE Sensitive     NITROFURANTOIN  128 RESISTANT Resistant     TRIMETH/SULFA <=20 SENSITIVE Sensitive     AMPICILLIN /SULBACTAM 8 SENSITIVE Sensitive     PIP/TAZO <=4 SENSITIVE Sensitive ug/mL    * 80,000 COLONIES/mL KLEBSIELLA PNEUMONIAE   Culture, blood (routine x 2) Call MD if unable to obtain prior to antibiotics being given     Status: None   Collection Time: 09/25/23  3:26 PM   Specimen: BLOOD  Result Value Ref Range Status   Specimen Description BLOOD BLOOD RIGHT HAND  Final   Special Requests   Final    BOTTLES DRAWN AEROBIC ONLY Blood Culture results may not be optimal due to an inadequate volume of blood received in culture bottles   Culture   Final    NO GROWTH 5 DAYS Performed at [facility], [address] [place], [place] [address]    Report Status 09/30/2023 FINAL  Final  Culture, blood (routine x 2) Call MD if unable to obtain prior to antibiotics being given     Status: None   Collection Time: 09/26/23  3:36 AM   Specimen: BLOOD  Result Value Ref Range Status   Specimen Description BLOOD RIGHT ARM  Final   Special Requests   Final    BOTTLES DRAWN AEROBIC AND ANAEROBIC Blood Culture adequate volume   Culture   Final    NO GROWTH 5 DAYS Performed at [facility], [address], [place], [place] [address]    Report Status 10/01/2023 FINAL  Final  MRSA Next Gen by PCR, Nasal     Status: None   Collection Time: 09/27/23  2:06 AM   Specimen: Nasal Mucosa; Nasal Swab  Result Value Ref Range Status   MRSA by PCR Next Gen NOT DETECTED NOT DETECTED Final    Comment: (NOTE) The GeneXpert MRSA Assay (FDA approved for NASAL specimens only), is one component of a comprehensive MRSA colonization surveillance program. It is not intended to diagnose MRSA infection nor to guide or monitor treatment for MRSA infections. Test performance is not FDA approved in patients less than 39 years old. Performed at [facility], [address] [place], [place] [address]   Respiratory (~20 pathogens) panel by PCR     Status: None   Collection Time: 09/27/23  4:12 AM   Specimen: Nasopharyngeal Swab; Respiratory  Result Value Ref Range Status   Adenovirus NOT DETECTED NOT DETECTED Final    Coronavirus 229E NOT DETECTED NOT DETECTED Final    Comment: (NOTE) The Coronavirus on the Respiratory Panel, DOES NOT test for the novel  Coronavirus (2019 nCoV)    Coronavirus HKU1 NOT DETECTED NOT DETECTED Final   Coronavirus NL63 NOT DETECTED NOT DETECTED Final   Coronavirus OC43 NOT DETECTED NOT DETECTED Final   Metapneumovirus NOT DETECTED NOT DETECTED Final   Rhinovirus / Enterovirus NOT DETECTED NOT DETECTED Final   Influenza A NOT DETECTED NOT DETECTED Final   Influenza B NOT DETECTED NOT DETECTED Final   Parainfluenza Virus 1 NOT DETECTED NOT DETECTED Final   Parainfluenza Virus 2 NOT DETECTED NOT DETECTED Final   Parainfluenza Virus 3 NOT DETECTED NOT DETECTED Final   Parainfluenza Virus 4 NOT DETECTED NOT DETECTED Final   Respiratory Syncytial Virus NOT DETECTED NOT DETECTED Final   Bordetella pertussis NOT DETECTED NOT DETECTED Final   Bordetella Parapertussis NOT DETECTED NOT DETECTED Final   Chlamydophila pneumoniae NOT DETECTED NOT DETECTED Final   Mycoplasma pneumoniae NOT DETECTED NOT DETECTED Final    Comment: Performed at [facility], [address] [place], [place] [address]  Culture, Respiratory w Gram Stain     Status: None   Collection Time: 09/28/23  3:04 PM   Specimen: Tracheal Aspirate; Respiratory  Result Value Ref Range Status   Specimen Description   Final    TRACHEAL ASPIRATE Performed at [facility], [address] [place], [place] [address]    Special Requests   Final    NONE Performed at [facility], [address] [place], [place] [address]    Gram Stain   Final    RARE WBC PRESENT, PREDOMINANTLY PMN NO ORGANISMS SEEN    Culture   Final    NO GROWTH 2 DAYS Performed at [facility], [address] [place], [place] [address]    Report Status 10/01/2023 FINAL  Final         Radiology Studies: DG Chest Port 1 View Result Date: 10/01/2023 CLINICAL DATA:  Hypoxia EXAM: PORTABLE CHEST 1 VIEW  COMPARISON:  X-ray 09/30/2023 FINDINGS: Hyperinflation. Slightly elevated right hemidiaphragm. Decreasing basilar atelectasis. Tiny pleural effusion. Kyphotic x-ray. Limited evaluation of the apices. No consolidation or edema. Previous left IJ catheter no longer identified. No definite pneumothorax. Known hiatal hernia. IMPRESSION: Decreasing basilar atelectasis. Removal of left IJ catheter. No obvious pneumothorax. Electronically Signed   By: Ranell Bring M.D.   On: 10/01/2023 11:12        Scheduled Meds:  atorvastatin   40 mg Per Tube Daily   carbamazepine   200 mg Per Tube TID   Chlorhexidine  Gluconate Cloth  6 each Topical Daily   feeding supplement  237 mL Oral TID BM   furosemide   40 mg Intravenous BID   heparin   5,000 Units Subcutaneous Q8H   insulin  aspart  0-5 Units Subcutaneous QHS   insulin  aspart  0-9 Units Subcutaneous TID WC   iron  polysaccharides  150 mg Per Tube Daily   levothyroxine   100 mcg Oral Q0600   multivitamin with minerals  1 tablet Oral Daily   OLANZapine   10 mg Oral Daily   And   OLANZapine   15 mg Oral QHS   pantoprazole   40 mg Oral Daily   PARoxetine   20 mg Oral Daily   Continuous Infusions:  ampicillin -sulbactam (UNASYN ) IV Stopped (10/02/23 0629)     LOS: 7 days   CRITICAL CARE Performed by: Devaughn KATHEE Ban   Total critical care time: 62 minutes  Critical care time was exclusive of separately billable procedures and treating other patients.  Critical care was necessary to treat or  prevent imminent or life-threatening deterioration.  Critical care was time spent personally by me on the following activities: development of treatment plan with patient and/or surrogate as well as nursing, discussions with consultants, evaluation of patient's response to treatment, examination of patient, obtaining history from patient or surrogate, ordering and performing treatments and interventions, ordering and review of laboratory studies, ordering and review of  radiographic studies, pulse oximetry and re-evaluation of patient's condition.   Devaughn KATHEE Ban, MD [facility]   If 7PM-7AM, please contact night-coverage [email or web address] Password [email or web address] 10/02/2023, 11:43 AM

## 2023-10-03 DIAGNOSIS — J189 Pneumonia, unspecified organism: Secondary | ICD-10-CM | POA: Diagnosis not present

## 2023-10-03 DIAGNOSIS — J9601 Acute respiratory failure with hypoxia: Secondary | ICD-10-CM | POA: Diagnosis not present

## 2023-10-03 DIAGNOSIS — Z515 Encounter for palliative care: Secondary | ICD-10-CM | POA: Diagnosis not present

## 2023-10-03 DIAGNOSIS — F2081 Schizophreniform disorder: Secondary | ICD-10-CM

## 2023-10-03 DIAGNOSIS — I509 Heart failure, unspecified: Secondary | ICD-10-CM | POA: Diagnosis not present

## 2023-10-03 LAB — BASIC METABOLIC PANEL WITH GFR
Anion gap: 13 (ref 5–15)
BUN: 16 mg/dL (ref 8–23)
CO2: 36 mmol/L — ABNORMAL HIGH (ref 22–32)
Calcium: 8.5 mg/dL — ABNORMAL LOW (ref 8.9–10.3)
Chloride: 88 mmol/L — ABNORMAL LOW (ref 98–111)
Creatinine, Ser: 0.44 mg/dL (ref 0.44–1.00)
GFR, Estimated: >60 mL/min (ref >60)
Glucose, Bld: 122 mg/dL — ABNORMAL HIGH (ref 70–99)
Potassium: 3.6 mmol/L (ref 3.5–5.1)
Sodium: 137 mmol/L (ref 135–145)

## 2023-10-03 LAB — MAGNESIUM: Magnesium: 2.1 mg/dL (ref 1.7–2.4)

## 2023-10-03 LAB — GLUCOSE, CAPILLARY
Glucose-Capillary: 96 mg/dL (ref 70–99)
Glucose-Capillary: 99 mg/dL (ref 70–99)
Glucose-Capillary: 147 mg/dL — ABNORMAL HIGH (ref 70–99)
Glucose-Capillary: 134 mg/dL — ABNORMAL HIGH (ref 70–99)

## 2023-10-03 LAB — PHOSPHORUS: Phosphorus: 3.1 mg/dL (ref 2.5–4.6)

## 2023-10-03 MED ORDER — SENNA 8.6 MG PO TABS: 2.0000 | ORAL_TABLET | Freq: Every day | ORAL | Status: DC

## 2023-10-03 MED ORDER — AMOXICILLIN-POT CLAVULANATE 875-125 MG PO TABS
1.0000 | ORAL_TABLET | Freq: Two times a day (BID) | ORAL | Status: DC
  Administered 2023-10-03 – 2023-10-05 (×4): 1 via ORAL
  Filled 2023-10-03 (×4): qty 1

## 2023-10-03 MED ORDER — SENNA 8.6 MG PO TABS
1.0000 | ORAL_TABLET | Freq: Every day | ORAL | Status: DC
  Administered 2023-10-03 – 2023-10-04 (×2): 8.6 mg via ORAL
  Filled 2023-10-03 (×2): qty 1

## 2023-10-03 MED ORDER — ATORVASTATIN CALCIUM 20 MG PO TABS
40.0000 mg | ORAL_TABLET | Freq: Every day | ORAL | Status: DC
  Administered 2023-10-04 – 2023-10-05 (×2): 40 mg via ORAL
  Filled 2023-10-03 (×2): qty 2

## 2023-10-03 MED ORDER — CARBAMAZEPINE 200 MG PO TABS
200.0000 mg | ORAL_TABLET | Freq: Three times a day (TID) | ORAL | Status: DC
  Administered 2023-10-03 – 2023-10-05 (×7): 200 mg via ORAL
  Filled 2023-10-03 (×9): qty 1

## 2023-10-03 MED ORDER — BISACODYL 10 MG RE SUPP
10.0000 mg | Freq: Every day | RECTAL | Status: DC | PRN
  Administered 2023-10-03: 10 mg via RECTAL
  Filled 2023-10-03: qty 1

## 2023-10-03 MED ORDER — POLYSACCHARIDE IRON COMPLEX 150 MG PO CAPS
150.0000 mg | ORAL_CAPSULE | Freq: Every day | ORAL | Status: DC
  Administered 2023-10-04 – 2023-10-05 (×2): 150 mg via ORAL
  Filled 2023-10-03 (×2): qty 1

## 2023-10-03 NOTE — Progress Notes (Signed)
 [facility] LIAISON NOTE  Received request from [place], Transitions of Care [facility]) for hospice services at home after discharge. Spoke with [name] Cassette to initiate education related to hospice philosophy, services, and team approach to care. He verbalized understanding of information given. Per discussion, the plan is for discharge home by EMS when medically cleared for discharge and after DME is delivered.  DME needs discussed.   Patient has the following equipment in the home:  hospital bed, wheelchair, [facility]  Patient/family requests the following equipment for delivery:  will likely need O2 and possibly cpap/bipap.  Will need to be re-assessed when closer to discharge and have DME ordered.                 The address has been verified and is correct in the chart.  Ayomide Zuleta is the family contact to arrange time of equipment delivery.  Please send signed and completed DNR home with patient/family if applicable.   Please provide prescriptions at discharge as needed to ensure ongoing symptom management.  [facility] information and contact numbers given to patient's father Carliyah Cotterman.  Above information shared with [place], [facility] and hospital medical care team.  Please call with any hospice related questions or concerns.  Thank you for the opportunity to participate in this patient's care.  Saddie HILARIO Na, MA, BSN, RN, FNE Nurse Liaison [phone]

## 2023-10-03 NOTE — Progress Notes (Signed)
 Patient ID: Michele James, female   DOB: 23-Dec-1955, 68 y.o.   MRN: [id number] [facility] Cardiology    SUBJECTIVE: Patient resting comfortably in bed denies any pain no shortness of breath states she would like to go home   Vitals:   10/03/23 1300 10/03/23 1400 10/03/23 1448 10/03/23 1552  BP: 104/85 (!) 95/55 (!) 92/46 (!) 114/42  Pulse: 95 95 98 (!) 105  Resp: (!) 25 (!) 21 (!) 22 20  Temp:   99 F (37.2 C) 98.8 F (37.1 C)  TempSrc:      SpO2: 95% 99% 93% 96%  Weight:      Height:         Intake/Output Summary (Last 24 hours) at 10/03/2023 1557 Last data filed at 10/03/2023 1544 Gross per 24 hour  Intake 500 ml  Output 2750 ml  Net -2250 ml      PHYSICAL EXAM  General: Well developed, well nourished, in no acute distress HEENT:  Normocephalic and atramatic Neck:  No JVD.  Lungs: Clear bilaterally to auscultation and percussion. Heart: HRRR . Normal S1 and S2 without gallops or murmurs.  Abdomen: Bowel sounds are positive, abdomen soft and non-tender  Msk:  Back normal, normal gait. Normal strength and tone for age. Extremities: No clubbing, cyanosis or edema.   Neuro: Alert and oriented X 3.  Diffuse contractures upper extremity Psych:  Good affect, responds appropriately   LABS: Basic Metabolic Panel: Recent Labs    10/02/23 0340 10/03/23 0524  NA 138 137  K 3.8 3.6  CL 93* 88*  CO2 36* 36*  GLUCOSE 126* 122*  BUN 16 16  CREATININE 0.41* 0.44  CALCIUM  8.7* 8.5*  MG 1.9 2.1  PHOS 4.0 3.1   Liver Function Tests: No results for input(s): AST, ALT, ALKPHOS, BILITOT, PROT, ALBUMIN in the last 72 hours. No results for input(s): LIPASE, AMYLASE in the last 72 hours. CBC: Recent Labs    10/01/23 0550 10/02/23 0340  WBC 8.1 9.2  HGB 11.5* 11.8*  HCT 36.8 39.1  MCV 100.8* 102.6*  PLT 236 239   Cardiac Enzymes: No results for input(s): CKTOTAL, CKMB, CKMBINDEX, TROPONINI in the last 72 hours. BNP: Invalid input(s):  POCBNP D-Dimer: No results for input(s): DDIMER in the last 72 hours. Hemoglobin A1C: No results for input(s): HGBA1C in the last 72 hours. Fasting Lipid Panel: No results for input(s): CHOL, HDL, LDLCALC, TRIG, CHOLHDL, LDLDIRECT in the last 72 hours. Thyroid  Function Tests: No results for input(s): TSH, T4TOTAL, T3FREE, THYROIDAB in the last 72 hours.  Invalid input(s): FREET3 Anemia Panel: No results for input(s): VITAMINB12, FOLATE, FERRITIN, TIBC, IRON , RETICCTPCT in the last 72 hours.  CT ABDOMEN PELVIS W CONTRAST Result Date: 10/02/2023 CLINICAL DATA:  Fluid collection in pelvis seen on prior CT EXAM: CT ABDOMEN AND PELVIS WITH CONTRAST TECHNIQUE: Multidetector CT imaging of the abdomen and pelvis was performed using the standard protocol following bolus administration of intravenous contrast. RADIATION DOSE REDUCTION: This exam was performed according to the departmental dose-optimization program which includes automated exposure control, adjustment of the mA and/or kV according to patient size and/or use of iterative reconstruction technique. CONTRAST:  OMNIPAQUE  IOHEXOL  300 MG/ML  SOLN COMPARISON:  09/22/2023 FINDINGS: Lower chest: Right lower lobe pulmonary nodule measures 9 mm. Previously seen right pleural effusion has resolved. No current pleural effusions. Moderate-sized hiatal hernia with compressive atelectasis in the adjacent left lower lobe. Dependent atelectasis in the right lower lobe. Hepatobiliary: No focal liver abnormality is seen.  Status post cholecystectomy. No biliary dilatation. Pancreas: No focal abnormality or ductal dilatation. Spleen: No focal abnormality.  Normal size. Adrenals/Urinary Tract: Foley catheter present in the bladder. No renal or adrenal mass. No hydronephrosis. Stomach/Bowel: Large stool burden throughout the colon. Decreasing stool burden in the rectum since prior study. Stomach and small bowel decompressed.  No bowel obstruction or inflammatory process. Vascular/Lymphatic: No evidence of aneurysm or adenopathy. Reproductive: Prior hysterectomy Other: Previously seen air and fluid collection in the pelvis has decreased in size. Air is no longer visualized. Soft tissue density area in the pelvis now measures 6.4 x 3.9 cm. Musculoskeletal: No acute bony abnormality. IMPRESSION: Previously seen air and fluid collection in the pelvis has slightly decreased in size and air is no longer visualized. Large stool burden throughout the colon. Resolved right pleural effusion. Moderate hiatal hernia. 9 mm right lower lobe pulmonary nodule. Recommend attention on follow-up imaging. Electronically Signed   By: Franky Crease M.D.   On: 10/02/2023 23:21     Echo mild to moderate reduced left ventricular function EF around 35 to 50%  TELEMETRY: Sinus rhythm rate of 90 nonspecific EKG changes:  ASSESSMENT AND PLAN:  Principal Problem:   Acute hypoxic respiratory failure (HCC) Active Problems:   Anxiety and depression   Chronic GERD   Hypothyroidism   Seizures (HCC)   Anemia   Diabetes mellitus without complication (HCC)   Aphasia following other cerebrovascular disease   Pneumonia of both lungs due to infectious organism   Schizophreniform disorder (HCC)   UTI (urinary tract infection)   CHF exacerbation (HCC)   PVD (peripheral vascular disease) (HCC)   History of cerebral palsy    Plan Pneumonia improving agree with antibiotic therapy inhalers Continue PPI for GERD symptoms Seizure disorder continue current medical therapy History of schizophrenia continue current therapy Cardiomyopathy EF 35 to 40% moderate RV dysfunction pulm hypertension continue medical therapy Status post acute respiratory failure continue supportive management Continue diabetes management and control Maintain conservative cardiac input    Cara JONETTA Lovelace, MD 10/03/2023 3:57 PM

## 2023-10-03 NOTE — Progress Notes (Signed)
 Received this patient around 2:45 pm from ICU, her BP has been on lower range, MAP has been <65,  MD made aware of, order to hold of on evening dose lasix .

## 2023-10-03 NOTE — Progress Notes (Signed)
 PROGRESS NOTE    Michele James  FMW:[id number] DOB: 13-Mar-1956 DOA: 09/25/2023 PCP: Glover Lenis, MD    Brief Narrative:   68 year old female with multiple medical problems admitted with acute hypoxic respiratory failure in the setting of Acute decompensated HFrEF, sepsis due to suspected Aspiration Pneumonia and Klebsiella Pneumoniae/Enterococcus Faecalis UTI, and Acute Metabolic Encephalopathy requiring intubation and mechanical ventilation.  Per ED notes, EMS noted sats 90% on room air and she was placed on 2 L nasal cannula.  6/27:Admit to [facility] service with new onset CHF and CAP 6/28: Stable on 5L 6/29: Rapid response called for Acute hypoxic respiratory Failure and altered mental status. Transferred to ICU, patient unresponsive with agonal respirations and increased work of breathing. Intubated emergently for airway protection. 7/1: Extubated to [facility] 7/2: No significant events noted overnight, tolerating extubation well.  Afebrile, hemodynamically stable, weaned off vasopressors.  Creatinine remains stable, UOP 5.6 L last 24 hours (net - 10.4 L), decrease Lasix  to 40 mg BID.  Consult PT/OT.  Assessment & Plan:   Principal Problem:   Acute hypoxic respiratory failure (HCC) Active Problems:   CHF exacerbation (HCC)   UTI (urinary tract infection)   Pneumonia of both lungs due to infectious organism   Diabetes mellitus without complication (HCC)   Anemia   Hypothyroidism   PVD (peripheral vascular disease) (HCC)   Seizures (HCC)   Chronic GERD   Anxiety and depression   History of cerebral palsy   Aphasia following other cerebrovascular disease   Schizophreniform disorder (HCC)  # Acute hypoxic respiratory failure 2/2 pna, chf. Intubated initially, then weaned to hfnc, now weaned to room air - [place] O2 - watch for hypercarbia  # Goals of care Palliative consulted, father is interested in home with hospice.  # Acute encephalopathy Resolved, likely 2/2  hypercarbia  # HFrEF with acute exacerbation # Demand ischemia Cardiology following. EF 35-40 with moderate RV dysfunction and pulmonary hypertension. Diuresing well, out 2.3 liters yesterday. Trops elevated, cardiology thinks demand - continue diuresis per cardiology - strict I/os - d/c Foley  # Sepsis  Resolved  # Aspiration pneumonia Resolving. CXR improving. - continue unasyn  for 5 day course  # Klebsiella and enterococcus uti - continue unasyn  as above  # Fluid collection Incidental on 6/24 CT angio ordered by fascular, in the recto vesicular recess consistent with abscess, stable if slightly improved on CT today. Gen surg advising continuing abx - start augmentin  - will ask IR to eval  # T2DM  Eugolycemic - continue ssi  # Cerebral palsy With contractures, bedbound at baseline  # Schizophrenia - cont home zyprexa , xanax , tegretol , paroxetine   # Hypothyroid - home levothyroxine    DVT prophylaxis: lovenox  Code Status: full Family Communication: father [name] updated telephonically 7/5  Level of care: Progressive Status is: Inpatient Remains inpatient appropriate because: severity of illness    Consultants:  Pccm has signed off cardiology  Procedures: Intubation, extubation  Antimicrobials:  See above    Subjective: No complaints, says breathing is comfortable, tolerating diet  Objective: Vitals:   10/03/23 0800 10/03/23 0900 10/03/23 1000 10/03/23 1200  BP: (!) 106/55 (!) 99/57 (!) 95/56 (!) 111/56  Pulse: 92 98 93 93  Resp: (!) 22 16 20    Temp: 99.6 F (37.6 C)   99.1 F (37.3 C)  TempSrc: Oral   Axillary  SpO2: 97% 97% 96% 98%  Weight:      Height:        Intake/Output Summary (Last 24  hours) at 10/03/2023 1358 Last data filed at 10/03/2023 1200 Gross per 24 hour  Intake 640 ml  Output 2575 ml  Net -1935 ml   Filed Weights   10/01/23 0500 10/02/23 0500 10/03/23 0500  Weight: 71.1 kg 69.4 kg 68.8 kg    Examination:  General  exam: Appears calm and comfortable  Respiratory system: Clear to auscultation save for rales at bases Cardiovascular system: S1 & S2 heard, RRR.   Gastrointestinal system: Abdomen is nondistended, soft and nontender.  . Central nervous system: Alert and oriented to self, place Extremities: contractures Skin: No visible rashes, lesions or ulcers Psychiatry: calm   Data Reviewed: I have personally reviewed following labs and imaging studies  CBC: Recent Labs  Lab 09/28/23 0406 09/29/23 0447 09/30/23 0429 10/01/23 0550 10/02/23 0340  WBC 7.2 7.2 6.5 8.1 9.2  HGB 11.6* 11.3* 10.6* 11.5* 11.8*  HCT 35.0* 34.7* 33.6* 36.8 39.1  MCV 95.1 97.5 99.7 100.8* 102.6*  PLT 283 251 217 236 239   Basic Metabolic Panel: Recent Labs  Lab 09/29/23 0447 09/29/23 1337 09/29/23 1940 09/30/23 0429 09/30/23 2005 10/01/23 0550 10/02/23 0340 10/03/23 0524  NA 139   < > 132* 137  --  141 138 137  K 3.8   < > 4.2 3.8 3.6 3.6 3.8 3.6  CL 99   < > 93* 100  --  99 93* 88*  CO2 30   < > 29 32  --  32 36* 36*  GLUCOSE 108*   < > 92 89  --  120* 126* 122*  BUN 11   < > 11 11  --  15 16 16   CREATININE 0.39*   < > 0.45 0.33*  --  0.47 0.41* 0.44  CALCIUM  7.8*   < > 7.8* 7.9*  --  8.5* 8.7* 8.5*  MG 2.1  --   --  1.8 1.8 2.0 1.9 2.1  PHOS 2.8  --   --  2.8  --  3.0 4.0 3.1   < > = values in this interval not displayed.   GFR: Estimated Creatinine Clearance: 64.4 mL/min (by C-G formula based on SCr of 0.44 mg/dL). Liver Function Tests: Recent Labs  Lab 09/27/23 0213  AST 24  ALT 27  ALKPHOS 79  BILITOT 0.8  PROT 5.9*  ALBUMIN 3.1*   No results for input(s): LIPASE, AMYLASE in the last 168 hours. No results for input(s): AMMONIA in the last 168 hours. Coagulation Profile: No results for input(s): INR, PROTIME in the last 168 hours. Cardiac Enzymes: No results for input(s): CKTOTAL, CKMB, CKMBINDEX, TROPONINI in the last 168 hours. BNP (last 3 results) No results for  input(s): PROBNP in the last 8760 hours. HbA1C: No results for input(s): HGBA1C in the last 72 hours. CBG: Recent Labs  Lab 10/02/23 1134 10/02/23 1609 10/02/23 2128 10/03/23 0738 10/03/23 1119  GLUCAP 181* 149* 102* 96 99   Lipid Profile: No results for input(s): CHOL, HDL, LDLCALC, TRIG, CHOLHDL, LDLDIRECT in the last 72 hours. Thyroid  Function Tests: No results for input(s): TSH, T4TOTAL, FREET4, T3FREE, THYROIDAB in the last 72 hours. Anemia Panel: No results for input(s): VITAMINB12, FOLATE, FERRITIN, TIBC, IRON , RETICCTPCT in the last 72 hours. Urine analysis:    Component Value Date/Time   COLORURINE AMBER (A) 09/25/2023 0955   APPEARANCEUR HAZY (A) 09/25/2023 0955   LABSPEC 1.029 09/25/2023 0955   PHURINE 5.0 09/25/2023 0955   GLUCOSEU NEGATIVE 09/25/2023 0955   HGBUR NEGATIVE 09/25/2023 0955  BILIRUBINUR NEGATIVE 09/25/2023 0955   KETONESUR NEGATIVE 09/25/2023 0955   PROTEINUR 100 (A) 09/25/2023 0955   NITRITE NEGATIVE 09/25/2023 0955   LEUKOCYTESUR MODERATE (A) 09/25/2023 0955   Sepsis Labs: @LABRCNTIP (procalcitonin:4,lacticidven:4)  ) Recent Results (from the past 240 hours)  Resp panel by RT-PCR (RSV, Flu A&B, Covid) Anterior Nasal Swab     Status: None   Collection Time: 09/25/23  7:52 AM   Specimen: Anterior Nasal Swab  Result Value Ref Range Status   SARS Coronavirus 2 by RT PCR NEGATIVE NEGATIVE Final    Comment: (NOTE) SARS-CoV-2 target nucleic acids are NOT DETECTED.  The SARS-CoV-2 RNA is generally detectable in upper respiratory specimens during the acute phase of infection. The lowest concentration of SARS-CoV-2 viral copies this assay can detect is 138 copies/mL. A negative result does not preclude SARS-Cov-2 infection and should not be used as the sole basis for treatment or other patient management decisions. A negative result may occur with  improper specimen collection/handling, submission of  specimen other than nasopharyngeal swab, presence of viral mutation(s) within the areas targeted by this assay, and inadequate number of viral copies(<138 copies/mL). A negative result must be combined with clinical observations, patient history, and epidemiological information. The expected result is Negative.  Fact Sheet for Patients:  BloggerCourse.com  Fact Sheet for Healthcare Providers:  SeriousBroker.it  This test is no t yet approved or cleared by the [place]  FDA and  has been authorized for detection and/or diagnosis of SARS-CoV-2 by FDA under an Emergency Use Authorization (EUA). This EUA will remain  in effect (meaning this test can be used) for the duration of the COVID-19 declaration under Section 564(b)(1) of the Act, 21 U.S.C.section 360bbb-3(b)(1), unless the authorization is terminated  or revoked sooner.       Influenza A by PCR NEGATIVE NEGATIVE Final   Influenza B by PCR NEGATIVE NEGATIVE Final    Comment: (NOTE) The Xpert Xpress SARS-CoV-2/FLU/RSV plus assay is intended as an aid in the diagnosis of influenza from Nasopharyngeal swab specimens and should not be used as a sole basis for treatment. Nasal washings and aspirates are unacceptable for Xpert Xpress SARS-CoV-2/FLU/RSV testing.  Fact Sheet for Patients: BloggerCourse.com  Fact Sheet for Healthcare Providers: SeriousBroker.it  This test is not yet approved or cleared by the [place]  FDA and has been authorized for detection and/or diagnosis of SARS-CoV-2 by FDA under an Emergency Use Authorization (EUA). This EUA will remain in effect (meaning this test can be used) for the duration of the COVID-19 declaration under Section 564(b)(1) of the Act, 21 U.S.C. section 360bbb-3(b)(1), unless the authorization is terminated or revoked.     Resp Syncytial Virus by PCR NEGATIVE NEGATIVE Final     Comment: (NOTE) Fact Sheet for Patients: BloggerCourse.com  Fact Sheet for Healthcare Providers: SeriousBroker.it  This test is not yet approved or cleared by the [place]  FDA and has been authorized for detection and/or diagnosis of SARS-CoV-2 by FDA under an Emergency Use Authorization (EUA). This EUA will remain in effect (meaning this test can be used) for the duration of the COVID-19 declaration under Section 564(b)(1) of the Act, 21 U.S.C. section 360bbb-3(b)(1), unless the authorization is terminated or revoked.  Performed at [facility], [address], [place], [place] [address]   Urine Culture     Status: Abnormal   Collection Time: 09/25/23  9:55 AM   Specimen: Urine, Random  Result Value Ref Range Status   Specimen Description   Final  URINE, RANDOM Performed at [facility], [address] [place], [place] [address]    Special Requests   Final    NONE Reflexed from [id number] Performed at [facility], [address] [place], [place] [address]    Culture (A)  Final    80,000 COLONIES/mL KLEBSIELLA PNEUMONIAE >=100,000 COLONIES/mL ENTEROCOCCUS FAECALIS    Report Status 09/27/2023 FINAL  Final   Organism ID, Bacteria KLEBSIELLA PNEUMONIAE (A)  Final   Organism ID, Bacteria ENTEROCOCCUS FAECALIS (A)  Final      Susceptibility   Enterococcus faecalis - MIC*    AMPICILLIN  <=2 SENSITIVE Sensitive     NITROFURANTOIN  <=16 SENSITIVE Sensitive     VANCOMYCIN 1 SENSITIVE Sensitive     * >=100,000 COLONIES/mL ENTEROCOCCUS FAECALIS   Klebsiella pneumoniae - MIC*    AMPICILLIN  >=32 RESISTANT Resistant     CEFAZOLIN <=4 SENSITIVE Sensitive     CEFEPIME <=0.12 SENSITIVE Sensitive     CEFTRIAXONE  <=0.25 SENSITIVE Sensitive     CIPROFLOXACIN 0.5 INTERMEDIATE Intermediate     GENTAMICIN >=16 RESISTANT Resistant     IMIPENEM <=0.25 SENSITIVE Sensitive     NITROFURANTOIN  128 RESISTANT  Resistant     TRIMETH/SULFA <=20 SENSITIVE Sensitive     AMPICILLIN /SULBACTAM 8 SENSITIVE Sensitive     PIP/TAZO <=4 SENSITIVE Sensitive ug/mL    * 80,000 COLONIES/mL KLEBSIELLA PNEUMONIAE  Culture, blood (routine x 2) Call MD if unable to obtain prior to antibiotics being given     Status: None   Collection Time: 09/25/23  3:26 PM   Specimen: BLOOD  Result Value Ref Range Status   Specimen Description BLOOD BLOOD RIGHT HAND  Final   Special Requests   Final    BOTTLES DRAWN AEROBIC ONLY Blood Culture results may not be optimal due to an inadequate volume of blood received in culture bottles   Culture   Final    NO GROWTH 5 DAYS Performed at [facility], [address] [place], [place] [address]    Report Status 09/30/2023 FINAL  Final  Culture, blood (routine x 2) Call MD if unable to obtain prior to antibiotics being given     Status: None   Collection Time: 09/26/23  3:36 AM   Specimen: BLOOD  Result Value Ref Range Status   Specimen Description BLOOD RIGHT ARM  Final   Special Requests   Final    BOTTLES DRAWN AEROBIC AND ANAEROBIC Blood Culture adequate volume   Culture   Final    NO GROWTH 5 DAYS Performed at [facility], [address] [place], [place] [address]    Report Status 10/01/2023 FINAL  Final  MRSA Next Gen by PCR, Nasal     Status: None   Collection Time: 09/27/23  2:06 AM   Specimen: Nasal Mucosa; Nasal Swab  Result Value Ref Range Status   MRSA by PCR Next Gen NOT DETECTED NOT DETECTED Final    Comment: (NOTE) The GeneXpert MRSA Assay (FDA approved for NASAL specimens only), is one component of a comprehensive MRSA colonization surveillance program. It is not intended to diagnose MRSA infection nor to guide or monitor treatment for MRSA infections. Test performance is not FDA approved in patients less than 62 years old. Performed at [facility], [address] [place], [place] [address]   Respiratory (~20 pathogens)  panel by PCR     Status: None   Collection Time: 09/27/23  4:12 AM   Specimen: Nasopharyngeal Swab; Respiratory  Result Value  Ref Range Status   Adenovirus NOT DETECTED NOT DETECTED Final   Coronavirus 229E NOT DETECTED NOT DETECTED Final    Comment: (NOTE) The Coronavirus on the Respiratory Panel, DOES NOT test for the novel  Coronavirus (2019 nCoV)    Coronavirus HKU1 NOT DETECTED NOT DETECTED Final   Coronavirus NL63 NOT DETECTED NOT DETECTED Final   Coronavirus OC43 NOT DETECTED NOT DETECTED Final   Metapneumovirus NOT DETECTED NOT DETECTED Final   Rhinovirus / Enterovirus NOT DETECTED NOT DETECTED Final   Influenza A NOT DETECTED NOT DETECTED Final   Influenza B NOT DETECTED NOT DETECTED Final   Parainfluenza Virus 1 NOT DETECTED NOT DETECTED Final   Parainfluenza Virus 2 NOT DETECTED NOT DETECTED Final   Parainfluenza Virus 3 NOT DETECTED NOT DETECTED Final   Parainfluenza Virus 4 NOT DETECTED NOT DETECTED Final   Respiratory Syncytial Virus NOT DETECTED NOT DETECTED Final   Bordetella pertussis NOT DETECTED NOT DETECTED Final   Bordetella Parapertussis NOT DETECTED NOT DETECTED Final   Chlamydophila pneumoniae NOT DETECTED NOT DETECTED Final   Mycoplasma pneumoniae NOT DETECTED NOT DETECTED Final    Comment: Performed at [facility], [address] [place], [place] [address]  Culture, Respiratory w Gram Stain     Status: None   Collection Time: 09/28/23  3:04 PM   Specimen: Tracheal Aspirate; Respiratory  Result Value Ref Range Status   Specimen Description   Final    TRACHEAL ASPIRATE Performed at [facility], [address] [place], [place] [address]    Special Requests   Final    NONE Performed at [facility], [address] [place], [place] [address]    Gram Stain   Final    RARE WBC PRESENT, PREDOMINANTLY PMN NO ORGANISMS SEEN    Culture   Final    NO GROWTH 2 DAYS Performed at [facility], [address]  [place], [place] [address]    Report Status 10/01/2023 FINAL  Final         Radiology Studies: CT ABDOMEN PELVIS W CONTRAST Result Date: 10/02/2023 CLINICAL DATA:  Fluid collection in pelvis seen on prior CT EXAM: CT ABDOMEN AND PELVIS WITH CONTRAST TECHNIQUE: Multidetector CT imaging of the abdomen and pelvis was performed using the standard protocol following bolus administration of intravenous contrast. RADIATION DOSE REDUCTION: This exam was performed according to the departmental dose-optimization program which includes automated exposure control, adjustment of the mA and/or kV according to patient size and/or use of iterative reconstruction technique. CONTRAST:  OMNIPAQUE  IOHEXOL  300 MG/ML  SOLN COMPARISON:  09/22/2023 FINDINGS: Lower chest: Right lower lobe pulmonary nodule measures 9 mm. Previously seen right pleural effusion has resolved. No current pleural effusions. Moderate-sized hiatal hernia with compressive atelectasis in the adjacent left lower lobe. Dependent atelectasis in the right lower lobe. Hepatobiliary: No focal liver abnormality is seen. Status post cholecystectomy. No biliary dilatation. Pancreas: No focal abnormality or ductal dilatation. Spleen: No focal abnormality.  Normal size. Adrenals/Urinary Tract: Foley catheter present in the bladder. No renal or adrenal mass. No hydronephrosis. Stomach/Bowel: Large stool burden throughout the colon. Decreasing stool burden in the rectum since prior study. Stomach and small bowel decompressed. No bowel obstruction or inflammatory process. Vascular/Lymphatic: No evidence of aneurysm or adenopathy. Reproductive: Prior hysterectomy Other: Previously seen air and fluid collection in the pelvis has decreased in size. Air is no longer visualized. Soft tissue density area in the pelvis now measures 6.4 x 3.9 cm. Musculoskeletal:  No acute bony abnormality. IMPRESSION: Previously seen air and fluid collection in the pelvis has slightly decreased  in size and air is no longer visualized. Large stool burden throughout the colon. Resolved right pleural effusion. Moderate hiatal hernia. 9 mm right lower lobe pulmonary nodule. Recommend attention on follow-up imaging. Electronically Signed   By: Franky Crease M.D.   On: 10/02/2023 23:21        Scheduled Meds:  [START ON 10/04/2023] atorvastatin   40 mg Oral Daily   carbamazepine   200 mg Oral TID   Chlorhexidine  Gluconate Cloth  6 each Topical Daily   feeding supplement  237 mL Oral TID BM   furosemide   40 mg Intravenous BID   heparin   5,000 Units Subcutaneous Q8H   insulin  aspart  0-5 Units Subcutaneous QHS   insulin  aspart  0-9 Units Subcutaneous TID WC   [START ON 10/04/2023] iron  polysaccharides  150 mg Oral Daily   levothyroxine   100 mcg Oral Q0600   multivitamin with minerals  1 tablet Oral Daily   OLANZapine   10 mg Oral Daily   And   OLANZapine   15 mg Oral QHS   pantoprazole   40 mg Oral Daily   PARoxetine   20 mg Oral Daily   senna  2 tablet Oral QHS   Continuous Infusions:     LOS: 8 days    Devaughn KATHEE Ban, MD [facility]   If 7PM-7AM, please contact night-coverage [email or web address] Password [email or web address] 10/03/2023, 1:58 PM

## 2023-10-03 NOTE — Plan of Care (Signed)
  Problem: Coping: Goal: Ability to adjust to condition or change in health will improve Outcome: Progressing   Problem: Fluid Volume: Goal: Ability to maintain a balanced intake and output will improve Outcome: Progressing   Problem: Health Behavior/Discharge Planning: Goal: Ability to manage health-related needs will improve Outcome: Progressing   Problem: Clinical Measurements: Goal: Ability to maintain clinical measurements within normal limits will improve Outcome: Progressing

## 2023-10-03 NOTE — Progress Notes (Signed)
 Daily Progress Note   Patient Name: Michele James       Date: 10/03/2023 DOB: 1955-04-27  Age: 68 y.o. MRN#: [id number] Attending Physician: Kandis Devaughn Sayres, MD Primary Care Physician: Glover Lenis, MD Admit Date: 09/25/2023  Reason for Consultation/Follow-up: Establishing goals of care  HPI/Brief Hospital Review: 68 y.o. female  with past medical history of cerebral palsy, schizophrenia, aphasia, seizure disorder, iron  deficiency anemia due to chronic blood loss, T2DM, hypothyroidism, HLD admitted from home on 09/25/2023 with acute hypoxic respiratory failure due to acute decompensated HFrEF, sepsis secondary to aspiration PNA and UTI.   Required mechanical ventilation 6/29 due to worsening of acute hypoxic respiratory failure, successfully extubated 7/1-remains on HHFNC   Echo from this admission revealed EF 35-40%, moderate to severe RV systolic dysfunction and severely elevated PASP--diuresing with IV lasix , plan to repeat echo once stabilized and potential RHC as outpatient   Palliative medicine was consulted for assisting with goals of care conversations.  Subjective: Extensive chart review has been completed prior to meeting patient including labs, vital signs, imaging, progress notes, orders, and available advanced directive documents from current and previous encounters.    Visited with Michele James at Michele bedside. She is awake, alert, remains oriented to person and place. Poor situational awareness, memory loss noted. Continuously requests to be removed from bed pan although nursing staff assisted with this previously. Review of CT abdomen-revealing large stool burden. Per nursing reports, yesterday and through the evening had multiple hard stools-bowel regimen ordered to relieve  constipation.  Father and mother at bedside. Mother hard of hearing with slight cognitive impairment. Spoke primarily with father-Michele James. Michele James shares he is the primary caregiver in the home for his daughter, wife and sister in Social worker. Michele James is wheelchair bound at baseline and his sister in law is bed bound, they both require total care from him during the day. He does have a care aid that comes in each day to assist with getting Michele James from bed to wheelchair and returns at night to get Michele back to bed. Michele James shares the struggles he has as he is advanced age and has his own medical issues he is struggling with.  Michele James shares Michele James spends from 7AM-3PM in Michele wheelchair before going back to bed for the evening. Michele shares she sleeps the majority of  the day, when she is awake she spends Michele time watching television.  Attempted to gauge Michele James's understanding of Michele James's current medical condition. He shares from his understanding she has a heart condition that causes fluid build up and this is expected to recur. Attempted to provide further education related to [name]'s multiple underlying comorbid conditions as well as overall chronic disease trajectory. Michele James shares he feels he has pretty good understanding. Michele James shares his hope is for Ms. Stroebel to be comfortable. He shares she has been diagnosed with pneumonia several times in the last year.  We discussed quality versus quantity of life. Michele James again shares his focus on keeping Michele James comfortable.  We discussed hospice and the overall philosophy of hospice, services offered and the difference between hospice at home versus LTC versus IPU. At this time, Michele James is not appropriate for IPU. Michele James shares he would be interested in learning more about home with hospice services-he requests to have further conversations with hospice liaison. TOC and hospice liaison engaged.  Answered and addressed all questions and concerns. PMT to continue  to follow for ongoing needs and support.  Objective:  Physical Exam Constitutional:      General: She is not in acute distress.    Appearance: She is ill-appearing.  Pulmonary:     Effort: Pulmonary effort is normal. No respiratory distress.  Abdominal:     General: There is distension.     Tenderness: There is no abdominal tenderness.  Musculoskeletal:     Comments: LUE contracture  Skin:    General: Skin is warm and dry.  Neurological:     Mental Status: She is alert.     Comments: Oriented to person and place  Psychiatric:        Speech: Speech is delayed.        Cognition and Memory: Cognition is impaired. Memory is impaired.             Vital Signs: BP (!) 95/55   Pulse 95   Temp 99.1 F (37.3 C) (Axillary)   Resp (!) 21   Ht 5' 3 (1.6 m)   Wt 68.8 kg   SpO2 99%   BMI 26.87 kg/m  SpO2: SpO2: 99 % O2 Device: O2 Device: Bi-PAP O2 Flow Rate: O2 Flow Rate (L/min): 4 L/min   Palliative Care Assessment & Plan   Assessment/Recommendation/Plan  Family interested in possibly pursuing home with hospice services TOC and hospice liaison engaged PMT to continue to follow for ongoing needs and support  Care plan was discussed with primary team, nursing staff, [facility] and hospice liaison.  Thank you for allowing the Palliative Medicine Team to assist in the care of this patient.  Total time: 50 minutes  Time spent includes: Detailed review of medical records (labs, imaging, vital signs), medically appropriate exam (mental status, respiratory, cardiac, skin), discussed with treatment team, counseling and educating patient, family and staff, documenting clinical information, medication management and coordination of care.  Waddell Lesches, DNP, AGNP-C Palliative Medicine   Please contact Palliative Medicine Team phone at [phone] for questions and concerns.

## 2023-10-04 DIAGNOSIS — J189 Pneumonia, unspecified organism: Secondary | ICD-10-CM | POA: Diagnosis not present

## 2023-10-04 DIAGNOSIS — Z515 Encounter for palliative care: Secondary | ICD-10-CM | POA: Diagnosis not present

## 2023-10-04 DIAGNOSIS — I509 Heart failure, unspecified: Secondary | ICD-10-CM | POA: Diagnosis not present

## 2023-10-04 DIAGNOSIS — J9601 Acute respiratory failure with hypoxia: Secondary | ICD-10-CM | POA: Diagnosis not present

## 2023-10-04 LAB — GLUCOSE, CAPILLARY
Glucose-Capillary: 107 mg/dL — ABNORMAL HIGH (ref 70–99)
Glucose-Capillary: 126 mg/dL — ABNORMAL HIGH (ref 70–99)
Glucose-Capillary: 135 mg/dL — ABNORMAL HIGH (ref 70–99)
Glucose-Capillary: 202 mg/dL — ABNORMAL HIGH (ref 70–99)

## 2023-10-04 NOTE — Plan of Care (Signed)
  Problem: Education: Goal: Ability to describe self-care measures that may prevent or decrease complications (Diabetes Survival Skills Education) will improve Outcome: Progressing   Problem: Fluid Volume: Goal: Ability to maintain a balanced intake and output will improve Outcome: Progressing   Problem: Health Behavior/Discharge Planning: Goal: Ability to identify and utilize available resources and services will improve Outcome: Progressing

## 2023-10-04 NOTE — Plan of Care (Signed)
  Problem: Education: Goal: Ability to describe self-care measures that may prevent or decrease complications (Diabetes Survival Skills Education) will improve Outcome: Progressing   Problem: Coping: Goal: Ability to adjust to condition or change in health will improve Outcome: Progressing   Problem: Coping: Goal: Ability to adjust to condition or change in health will improve Outcome: Progressing   Problem: Fluid Volume: Goal: Ability to maintain a balanced intake and output will improve Outcome: Progressing   Problem: Metabolic: Goal: Ability to maintain appropriate glucose levels will improve Outcome: Progressing   Problem: Tissue Perfusion: Goal: Adequacy of tissue perfusion will improve Outcome: Progressing   Problem: Skin Integrity: Goal: Risk for impaired skin integrity will decrease Outcome: Progressing   Problem: Clinical Measurements: Goal: Diagnostic test results will improve Outcome: Progressing   Problem: Clinical Measurements: Goal: Will remain free from infection Outcome: Progressing   Problem: Respiratory: Goal: Ability to maintain adequate ventilation will improve Outcome: Progressing   Problem: Respiratory: Goal: Ability to maintain a clear airway will improve Outcome: Progressing    Plan of care, assessment, treatment, monitoring, and intervention (s) ongoing, see flowsheet see MAR

## 2023-10-04 NOTE — Progress Notes (Signed)
 PROGRESS NOTE    Michele James  FMW:[id number] DOB: 19-Aug-1955 DOA: 09/25/2023 PCP: Glover Lenis, MD    Brief Narrative:   68 year old female with multiple medical problems admitted with acute hypoxic respiratory failure in the setting of Acute decompensated HFrEF, sepsis due to suspected Aspiration Pneumonia and Klebsiella Pneumoniae/Enterococcus Faecalis UTI, and Acute Metabolic Encephalopathy requiring intubation and mechanical ventilation.  Per ED notes, EMS noted sats 90% on room air and she was placed on 2 L nasal cannula.  6/27:Admit to TRH service with new onset CHF and CAP 6/28: Stable on 5L 6/29: Rapid response called for Acute hypoxic respiratory Failure and altered mental status. Transferred to ICU, patient unresponsive with agonal respirations and increased work of breathing. Intubated emergently for airway protection. 7/1: Extubated to [facility]. 7/2: No significant events noted overnight, tolerating extubation well.  Afebrile, hemodynamically stable, weaned off vasopressors.  Creatinine remains stable, UOP 5.6 L last 24 hours (net - 10.4 L), decrease Lasix  to 40 mg BID.  Consult PT/OT.  Assessment & Plan:   Principal Problem:   Acute hypoxic respiratory failure (HCC) Active Problems:   CHF exacerbation (HCC)   UTI (urinary tract infection)   Pneumonia of both lungs due to infectious organism   Diabetes mellitus without complication (HCC)   Anemia   Hypothyroidism   PVD (peripheral vascular disease) (HCC)   Seizures (HCC)   Chronic GERD   Anxiety and depression   History of cerebral palsy   Aphasia following other cerebrovascular disease   Schizophreniform disorder (HCC)  # Acute hypoxic respiratory failure 2/2 pna, chf. Intubated initially, then weaned to hfnc, now weaned to nasal cannula - [place] O2 - watch for hypercarbia, will trial off bipap tonight  # Goals of care Palliative consulted, father is interested in home with hospice, plan for that tomorrow  #  Acute encephalopathy Resolved, likely 2/2 hypercarbia  # HFrEF with acute exacerbation # Demand ischemia Cardiology following. EF 35-40 with moderate RV dysfunction and pulmonary hypertension. Diuresing well, out 2.3 liters yesterday. Trops elevated, cardiology thinks demand - continue diuresis per cardiology - strict I/os  # Sepsis  Resolved  # Aspiration pneumonia Resolving. CXR improving. - s/p unasyn  for 5 day course  # Klebsiella and enterococcus uti - s/p unasyn  as above  # Fluid collection Incidental on 6/24 CT angio ordered by fascular, in the recto vesicular recess consistent with abscess, stable if slightly improved on CT 7/5. Gen surg advising continuing abx, thinks possibly 2/2 diverticulitis. IR consulted but no window for drain. - continue augmentin  - outpt gen surg f/u if consistent w/ goals of care  # T2DM  Eugolycemic - continue ssi  # Cerebral palsy With contractures, bedbound at baseline  # Schizophrenia - cont home zyprexa , xanax , tegretol , paroxetine   # Hypothyroid - home levothyroxine    DVT prophylaxis: lovenox  Code Status: full Family Communication: father [name] updated telephonically 7/6  Level of care: Progressive Status is: Inpatient Remains inpatient appropriate because: severity of illness    Consultants:  Pccm has signed off cardiology  Procedures: Intubation, extubation  Antimicrobials:  See above    Subjective: No complaints, stable o2, tolerated some lunch  Objective: Vitals:   10/04/23 0000 10/04/23 0517 10/04/23 0825 10/04/23 1238  BP: (!) 119/59 (!) 115/57 (!) 122/58 115/64  Pulse: 98 91 92 (!) 110  Resp: 18 18  17   Temp: (!) 97.1 F (36.2 C) (!) 97.1 F (36.2 C) 98.6 F (37 C) 98.6 F (37 C)  TempSrc:  SpO2: 97% 100% 98% 96%  Weight:  68 kg    Height:        Intake/Output Summary (Last 24 hours) at 10/04/2023 1408 Last data filed at 10/04/2023 0500 Gross per 24 hour  Intake 100 ml  Output 575 ml   Net -475 ml   Filed Weights   10/02/23 0500 10/03/23 0500 10/04/23 0517  Weight: 69.4 kg 68.8 kg 68 kg    Examination:  General exam: Appears calm and comfortable  Respiratory system: Clear to auscultation save for rales at bases Cardiovascular system: S1 & S2 heard, RRR.   Gastrointestinal system: Abdomen is nondistended, soft and nontender.  . Central nervous system: Alert and oriented to self, place Extremities: contractures Skin: No visible rashes, lesions or ulcers Psychiatry: calm   Data Reviewed: I have personally reviewed following labs and imaging studies  CBC: Recent Labs  Lab 09/28/23 0406 09/29/23 0447 09/30/23 0429 10/01/23 0550 10/02/23 0340  WBC 7.2 7.2 6.5 8.1 9.2  HGB 11.6* 11.3* 10.6* 11.5* 11.8*  HCT 35.0* 34.7* 33.6* 36.8 39.1  MCV 95.1 97.5 99.7 100.8* 102.6*  PLT 283 251 217 236 239   Basic Metabolic Panel: Recent Labs  Lab 09/29/23 0447 09/29/23 1337 09/29/23 1940 09/30/23 0429 09/30/23 2005 10/01/23 0550 10/02/23 0340 10/03/23 0524  NA 139   < > 132* 137  --  141 138 137  K 3.8   < > 4.2 3.8 3.6 3.6 3.8 3.6  CL 99   < > 93* 100  --  99 93* 88*  CO2 30   < > 29 32  --  32 36* 36*  GLUCOSE 108*   < > 92 89  --  120* 126* 122*  BUN 11   < > 11 11  --  15 16 16   CREATININE 0.39*   < > 0.45 0.33*  --  0.47 0.41* 0.44  CALCIUM  7.8*   < > 7.8* 7.9*  --  8.5* 8.7* 8.5*  MG 2.1  --   --  1.8 1.8 2.0 1.9 2.1  PHOS 2.8  --   --  2.8  --  3.0 4.0 3.1   < > = values in this interval not displayed.   GFR: Estimated Creatinine Clearance: 64 mL/min (by C-G formula based on SCr of 0.44 mg/dL). Liver Function Tests: No results for input(s): AST, ALT, ALKPHOS, BILITOT, PROT, ALBUMIN in the last 168 hours.  No results for input(s): LIPASE, AMYLASE in the last 168 hours. No results for input(s): AMMONIA in the last 168 hours. Coagulation Profile: No results for input(s): INR, PROTIME in the last 168 hours. Cardiac  Enzymes: No results for input(s): CKTOTAL, CKMB, CKMBINDEX, TROPONINI in the last 168 hours. BNP (last 3 results) No results for input(s): PROBNP in the last 8760 hours. HbA1C: No results for input(s): HGBA1C in the last 72 hours. CBG: Recent Labs  Lab 10/03/23 1119 10/03/23 1554 10/03/23 2119 10/04/23 0822 10/04/23 1241  GLUCAP 99 147* 134* 107* 126*   Lipid Profile: No results for input(s): CHOL, HDL, LDLCALC, TRIG, CHOLHDL, LDLDIRECT in the last 72 hours. Thyroid  Function Tests: No results for input(s): TSH, T4TOTAL, FREET4, T3FREE, THYROIDAB in the last 72 hours. Anemia Panel: No results for input(s): VITAMINB12, FOLATE, FERRITIN, TIBC, IRON , RETICCTPCT in the last 72 hours. Urine analysis:    Component Value Date/Time   COLORURINE AMBER (A) 09/25/2023 0955   APPEARANCEUR HAZY (A) 09/25/2023 0955   LABSPEC 1.029 09/25/2023 0955   PHURINE 5.0 09/25/2023  0955   GLUCOSEU NEGATIVE 09/25/2023 0955   HGBUR NEGATIVE 09/25/2023 0955   BILIRUBINUR NEGATIVE 09/25/2023 0955   KETONESUR NEGATIVE 09/25/2023 0955   PROTEINUR 100 (A) 09/25/2023 0955   NITRITE NEGATIVE 09/25/2023 0955   LEUKOCYTESUR MODERATE (A) 09/25/2023 0955   Sepsis Labs: @LABRCNTIP (procalcitonin:4,lacticidven:4)  ) Recent Results (from the past 240 hours)  Resp panel by RT-PCR (RSV, Flu A&B, Covid) Anterior Nasal Swab     Status: None   Collection Time: 09/25/23  7:52 AM   Specimen: Anterior Nasal Swab  Result Value Ref Range Status   SARS Coronavirus 2 by RT PCR NEGATIVE NEGATIVE Final    Comment: (NOTE) SARS-CoV-2 target nucleic acids are NOT DETECTED.  The SARS-CoV-2 RNA is generally detectable in upper respiratory specimens during the acute phase of infection. The lowest concentration of SARS-CoV-2 viral copies this assay can detect is 138 copies/mL. A negative result does not preclude SARS-Cov-2 infection and should not be used as the sole basis for  treatment or other patient management decisions. A negative result may occur with  improper specimen collection/handling, submission of specimen other than nasopharyngeal swab, presence of viral mutation(s) within the areas targeted by this assay, and inadequate number of viral copies(<138 copies/mL). A negative result must be combined with clinical observations, patient history, and epidemiological information. The expected result is Negative.  Fact Sheet for Patients:  BloggerCourse.com  Fact Sheet for Healthcare Providers:  SeriousBroker.it  This test is no t yet approved or cleared by the [place]  FDA and  has been authorized for detection and/or diagnosis of SARS-CoV-2 by FDA under an Emergency Use Authorization (EUA). This EUA will remain  in effect (meaning this test can be used) for the duration of the COVID-19 declaration under Section 564(b)(1) of the Act, 21 U.S.C.section 360bbb-3(b)(1), unless the authorization is terminated  or revoked sooner.       Influenza A by PCR NEGATIVE NEGATIVE Final   Influenza B by PCR NEGATIVE NEGATIVE Final    Comment: (NOTE) The Xpert Xpress SARS-CoV-2/FLU/RSV plus assay is intended as an aid in the diagnosis of influenza from Nasopharyngeal swab specimens and should not be used as a sole basis for treatment. Nasal washings and aspirates are unacceptable for Xpert Xpress SARS-CoV-2/FLU/RSV testing.  Fact Sheet for Patients: BloggerCourse.com  Fact Sheet for Healthcare Providers: SeriousBroker.it  This test is not yet approved or cleared by the [place]  FDA and has been authorized for detection and/or diagnosis of SARS-CoV-2 by FDA under an Emergency Use Authorization (EUA). This EUA will remain in effect (meaning this test can be used) for the duration of the COVID-19 declaration under Section 564(b)(1) of the Act, 21  U.S.C. section 360bbb-3(b)(1), unless the authorization is terminated or revoked.     Resp Syncytial Virus by PCR NEGATIVE NEGATIVE Final    Comment: (NOTE) Fact Sheet for Patients: BloggerCourse.com  Fact Sheet for Healthcare Providers: SeriousBroker.it  This test is not yet approved or cleared by the [place]  FDA and has been authorized for detection and/or diagnosis of SARS-CoV-2 by FDA under an Emergency Use Authorization (EUA). This EUA will remain in effect (meaning this test can be used) for the duration of the COVID-19 declaration under Section 564(b)(1) of the Act, 21 U.S.C. section 360bbb-3(b)(1), unless the authorization is terminated or revoked.  Performed at [facility], [address] [place], [place] [address]   Urine Culture     Status: Abnormal   Collection Time: 09/25/23  9:55 AM   Specimen: Urine,  Random  Result Value Ref Range Status   Specimen Description   Final    URINE, RANDOM Performed at [facility], [address] [place], [place] [address]    Special Requests   Final    NONE Reflexed from [id number] Performed at [facility], [address] [place], [place] [address]    Culture (A)  Final    80,000 COLONIES/mL KLEBSIELLA PNEUMONIAE >=100,000 COLONIES/mL ENTEROCOCCUS FAECALIS    Report Status 09/27/2023 FINAL  Final   Organism ID, Bacteria KLEBSIELLA PNEUMONIAE (A)  Final   Organism ID, Bacteria ENTEROCOCCUS FAECALIS (A)  Final      Susceptibility   Enterococcus faecalis - MIC*    AMPICILLIN  <=2 SENSITIVE Sensitive     NITROFURANTOIN  <=16 SENSITIVE Sensitive     VANCOMYCIN 1 SENSITIVE Sensitive     * >=100,000 COLONIES/mL ENTEROCOCCUS FAECALIS   Klebsiella pneumoniae - MIC*    AMPICILLIN  >=32 RESISTANT Resistant     CEFAZOLIN <=4 SENSITIVE Sensitive     CEFEPIME <=0.12 SENSITIVE Sensitive     CEFTRIAXONE  <=0.25 SENSITIVE Sensitive     CIPROFLOXACIN 0.5  INTERMEDIATE Intermediate     GENTAMICIN >=16 RESISTANT Resistant     IMIPENEM <=0.25 SENSITIVE Sensitive     NITROFURANTOIN  128 RESISTANT Resistant     TRIMETH/SULFA <=20 SENSITIVE Sensitive     AMPICILLIN /SULBACTAM 8 SENSITIVE Sensitive     PIP/TAZO <=4 SENSITIVE Sensitive ug/mL    * 80,000 COLONIES/mL KLEBSIELLA PNEUMONIAE  Culture, blood (routine x 2) Call MD if unable to obtain prior to antibiotics being given     Status: None   Collection Time: 09/25/23  3:26 PM   Specimen: BLOOD  Result Value Ref Range Status   Specimen Description BLOOD BLOOD RIGHT HAND  Final   Special Requests   Final    BOTTLES DRAWN AEROBIC ONLY Blood Culture results may not be optimal due to an inadequate volume of blood received in culture bottles   Culture   Final    NO GROWTH 5 DAYS Performed at [facility], [address] [place], [place] [address]    Report Status 09/30/2023 FINAL  Final  Culture, blood (routine x 2) Call MD if unable to obtain prior to antibiotics being given     Status: None   Collection Time: 09/26/23  3:36 AM   Specimen: BLOOD  Result Value Ref Range Status   Specimen Description BLOOD RIGHT ARM  Final   Special Requests   Final    BOTTLES DRAWN AEROBIC AND ANAEROBIC Blood Culture adequate volume   Culture   Final    NO GROWTH 5 DAYS Performed at [facility], [address] [place], [place] [address]    Report Status 10/01/2023 FINAL  Final  MRSA Next Gen by PCR, Nasal     Status: None   Collection Time: 09/27/23  2:06 AM   Specimen: Nasal Mucosa; Nasal Swab  Result Value Ref Range Status   MRSA by PCR Next Gen NOT DETECTED NOT DETECTED Final    Comment: (NOTE) The GeneXpert MRSA Assay (FDA approved for NASAL specimens only), is one component of a comprehensive MRSA colonization surveillance program. It is not intended to diagnose MRSA infection nor to guide or monitor treatment for MRSA infections. Test performance is not FDA approved in  patients less than 53 years old. Performed at [facility], [address] [place], [place] [address]   Respiratory (~20 pathogens) panel by PCR     Status: None  Collection Time: 09/27/23  4:12 AM   Specimen: Nasopharyngeal Swab; Respiratory  Result Value Ref Range Status   Adenovirus NOT DETECTED NOT DETECTED Final   Coronavirus 229E NOT DETECTED NOT DETECTED Final    Comment: (NOTE) The Coronavirus on the Respiratory Panel, DOES NOT test for the novel  Coronavirus (2019 nCoV)    Coronavirus HKU1 NOT DETECTED NOT DETECTED Final   Coronavirus NL63 NOT DETECTED NOT DETECTED Final   Coronavirus OC43 NOT DETECTED NOT DETECTED Final   Metapneumovirus NOT DETECTED NOT DETECTED Final   Rhinovirus / Enterovirus NOT DETECTED NOT DETECTED Final   Influenza A NOT DETECTED NOT DETECTED Final   Influenza B NOT DETECTED NOT DETECTED Final   Parainfluenza Virus 1 NOT DETECTED NOT DETECTED Final   Parainfluenza Virus 2 NOT DETECTED NOT DETECTED Final   Parainfluenza Virus 3 NOT DETECTED NOT DETECTED Final   Parainfluenza Virus 4 NOT DETECTED NOT DETECTED Final   Respiratory Syncytial Virus NOT DETECTED NOT DETECTED Final   Bordetella pertussis NOT DETECTED NOT DETECTED Final   Bordetella Parapertussis NOT DETECTED NOT DETECTED Final   Chlamydophila pneumoniae NOT DETECTED NOT DETECTED Final   Mycoplasma pneumoniae NOT DETECTED NOT DETECTED Final    Comment: Performed at [facility], [address], [place], [place] [address]  Culture, Respiratory w Gram Stain     Status: None   Collection Time: 09/28/23  3:04 PM   Specimen: Tracheal Aspirate; Respiratory  Result Value Ref Range Status   Specimen Description   Final    TRACHEAL ASPIRATE Performed at [facility], [address] [place], [place] [address]    Special Requests   Final    NONE Performed at [facility], [address] [place], [place] [address]    Gram Stain   Final    RARE WBC  PRESENT, PREDOMINANTLY PMN NO ORGANISMS SEEN    Culture   Final    NO GROWTH 2 DAYS Performed at [facility], [address] [place], [place] [address]    Report Status 10/01/2023 FINAL  Final         Radiology Studies: CT ABDOMEN PELVIS W CONTRAST Result Date: 10/02/2023 CLINICAL DATA:  Fluid collection in pelvis seen on prior CT EXAM: CT ABDOMEN AND PELVIS WITH CONTRAST TECHNIQUE: Multidetector CT imaging of the abdomen and pelvis was performed using the standard protocol following bolus administration of intravenous contrast. RADIATION DOSE REDUCTION: This exam was performed according to the departmental dose-optimization program which includes automated exposure control, adjustment of the mA and/or kV according to patient size and/or use of iterative reconstruction technique. CONTRAST:  OMNIPAQUE  IOHEXOL  300 MG/ML  SOLN COMPARISON:  09/22/2023 FINDINGS: Lower chest: Right lower lobe pulmonary nodule measures 9 mm. Previously seen right pleural effusion has resolved. No current pleural effusions. Moderate-sized hiatal hernia with compressive atelectasis in the adjacent left lower lobe. Dependent atelectasis in the right lower lobe. Hepatobiliary: No focal liver abnormality is seen. Status post cholecystectomy. No biliary dilatation. Pancreas: No focal abnormality or ductal dilatation. Spleen: No focal abnormality.  Normal size. Adrenals/Urinary Tract: Foley catheter present in the bladder. No renal or adrenal mass. No hydronephrosis. Stomach/Bowel: Large stool burden throughout the colon. Decreasing stool burden in the rectum since prior study. Stomach and small bowel decompressed. No bowel obstruction or inflammatory process. Vascular/Lymphatic: No evidence of aneurysm or adenopathy. Reproductive: Prior hysterectomy Other: Previously seen air and fluid collection in the pelvis has decreased in size. Air is no longer  visualized. Soft tissue density area in the pelvis now measures 6.4  x 3.9 cm. Musculoskeletal: No acute bony abnormality. IMPRESSION: Previously seen air and fluid collection in the pelvis has slightly decreased in size and air is no longer visualized. Large stool burden throughout the colon. Resolved right pleural effusion. Moderate hiatal hernia. 9 mm right lower lobe pulmonary nodule. Recommend attention on follow-up imaging. Electronically Signed   By: Franky Crease M.D.   On: 10/02/2023 23:21        Scheduled Meds:  amoxicillin -clavulanate  1 tablet Oral Q12H   atorvastatin   40 mg Oral Daily   carbamazepine   200 mg Oral TID   Chlorhexidine  Gluconate Cloth  6 each Topical Daily   feeding supplement  237 mL Oral TID BM   furosemide   40 mg Intravenous BID   heparin   5,000 Units Subcutaneous Q8H   insulin  aspart  0-5 Units Subcutaneous QHS   insulin  aspart  0-9 Units Subcutaneous TID WC   iron  polysaccharides  150 mg Oral Daily   levothyroxine   100 mcg Oral Q0600   multivitamin with minerals  1 tablet Oral Daily   OLANZapine   10 mg Oral Daily   And   OLANZapine   15 mg Oral QHS   pantoprazole   40 mg Oral Daily   PARoxetine   20 mg Oral Daily   senna  1 tablet Oral QHS   Continuous Infusions:     LOS: 9 days    Devaughn KATHEE Ban, MD [facility]   If 7PM-7AM, please contact night-coverage [email or web address] Password [email or web address] 10/04/2023, 2:08 PM

## 2023-10-04 NOTE — Progress Notes (Signed)
 Placed patient on room air. Oxygen saturation dropped to 80%. Placed patient back on 5L [place]. Oxygen saturation increased back to 95%

## 2023-10-04 NOTE — Progress Notes (Addendum)
 MD wants to trial patient off bipap tonight. MD discontinued BIPAP order. Bipap to remain in room on standby if needed.

## 2023-10-04 NOTE — TOC Progression Note (Addendum)
 Transition of Care [facility]) - Progression Note    Patient Details  Name: KEAIRA WHITEHURST MRN: [id number] Date of Birth: [birth year]  Transition of [facility]) CM/SW Contact  Lorraine LILLETTE Fenton, LCSW Phone Number: 10/04/2023, 2:09 PM  Clinical Narrative:    CSW added to [facility] chat.  PT DC 7/7 with [facility].  CSW clarified if home 02 should be ordered /set up, response in chat no- since pt DC with Hospice they will set that up.  TOC following.  Addendum: Father contacted and have scheduled oxygen delivery time  and WC tomorrow- pt can DC home after DME arrives.      Barriers to Discharge: Continued Medical Work up  Expected Discharge Plan and Services                                               Social Determinants of Health (SDOH) Interventions SDOH Screenings   Food Insecurity: No Food Insecurity (09/28/2023)  Housing: Unknown (09/28/2023)  Transportation Needs: No Transportation Needs (09/28/2023)  Utilities: Patient Unable To Answer (02/06/2023)  Depression (PHQ2-9): Low Risk  (02/03/2022)  Financial Resource Strain: Low Risk  (09/28/2023)  Social Connections: Unknown (09/26/2023)  Tobacco Use: Medium Risk (09/28/2023)    Readmission Risk Interventions     No data to display

## 2023-10-04 NOTE — Progress Notes (Signed)
 Patient ID: Michele James, female   DOB: January 03, 1956, 68 y.o.   MRN: [id number] [facility]    SUBJECTIVE: Patient states she feels much better would like to go home as soon as possible no pain no shortness of breath   Vitals:   10/03/23 1936 10/04/23 0000 10/04/23 0517 10/04/23 0825  BP: (!) 103/52 (!) 119/59 (!) 115/57 (!) 122/58  Pulse: 100 98 91 92  Resp: 18 18 18    Temp: (!) 97.5 F (36.4 C) (!) 97.1 F (36.2 C) (!) 97.1 F (36.2 C) 98.6 F (37 C)  TempSrc:      SpO2: 96% 97% 100% 98%  Weight:   68 kg   Height:         Intake/Output Summary (Last 24 hours) at 10/04/2023 0831 Last data filed at 10/04/2023 0500 Gross per 24 hour  Intake 100 ml  Output 1640 ml  Net -1540 ml      PHYSICAL EXAM  General: Well developed, well nourished, in no acute distress HEENT:  Normocephalic and atramatic Neck:  No JVD.  Lungs: Clear bilaterally to auscultation and percussion. Heart: HRRR . Normal S1 and S2 without gallops or murmurs.  Abdomen: Bowel sounds are positive, abdomen soft and non-tender  Msk:  Back normal, normal gait. Normal strength and tone for age. Extremities: No clubbing, cyanosis or edema.   Neuro: Alert and oriented X 3. Psych:  Good affect, responds appropriately   LABS: Basic Metabolic Panel: Recent Labs    10/02/23 0340 10/03/23 0524  NA 138 137  K 3.8 3.6  CL 93* 88*  CO2 36* 36*  GLUCOSE 126* 122*  BUN 16 16  CREATININE 0.41* 0.44  CALCIUM  8.7* 8.5*  MG 1.9 2.1  PHOS 4.0 3.1   Liver Function Tests: No results for input(s): AST, ALT, ALKPHOS, BILITOT, PROT, ALBUMIN in the last 72 hours. No results for input(s): LIPASE, AMYLASE in the last 72 hours. CBC: Recent Labs    10/02/23 0340  WBC 9.2  HGB 11.8*  HCT 39.1  MCV 102.6*  PLT 239   Cardiac Enzymes: No results for input(s): CKTOTAL, CKMB, CKMBINDEX, TROPONINI in the last 72 hours. BNP: Invalid input(s): POCBNP D-Dimer: No results for input(s):  DDIMER in the last 72 hours. Hemoglobin A1C: No results for input(s): HGBA1C in the last 72 hours. Fasting Lipid Panel: No results for input(s): CHOL, HDL, LDLCALC, TRIG, CHOLHDL, LDLDIRECT in the last 72 hours. Thyroid  Function Tests: No results for input(s): TSH, T4TOTAL, T3FREE, THYROIDAB in the last 72 hours.  Invalid input(s): FREET3 Anemia Panel: No results for input(s): VITAMINB12, FOLATE, FERRITIN, TIBC, IRON , RETICCTPCT in the last 72 hours.  CT ABDOMEN PELVIS W CONTRAST Result Date: 10/02/2023 CLINICAL DATA:  Fluid collection in pelvis seen on prior CT EXAM: CT ABDOMEN AND PELVIS WITH CONTRAST TECHNIQUE: Multidetector CT imaging of the abdomen and pelvis was performed using the standard protocol following bolus administration of intravenous contrast. RADIATION DOSE REDUCTION: This exam was performed according to the departmental dose-optimization program which includes automated exposure control, adjustment of the mA and/or kV according to patient size and/or use of iterative reconstruction technique. CONTRAST:  OMNIPAQUE  IOHEXOL  300 MG/ML  SOLN COMPARISON:  09/22/2023 FINDINGS: Lower chest: Right lower lobe pulmonary nodule measures 9 mm. Previously seen right pleural effusion has resolved. No current pleural effusions. Moderate-sized hiatal hernia with compressive atelectasis in the adjacent left lower lobe. Dependent atelectasis in the right lower lobe. Hepatobiliary: No focal liver abnormality is seen. Status post cholecystectomy.  No biliary dilatation. Pancreas: No focal abnormality or ductal dilatation. Spleen: No focal abnormality.  Normal size. Adrenals/Urinary Tract: Foley catheter present in the bladder. No renal or adrenal mass. No hydronephrosis. Stomach/Bowel: Large stool burden throughout the colon. Decreasing stool burden in the rectum since prior study. Stomach and small bowel decompressed. No bowel obstruction or inflammatory process.  Vascular/Lymphatic: No evidence of aneurysm or adenopathy. Reproductive: Prior hysterectomy Other: Previously seen air and fluid collection in the pelvis has decreased in size. Air is no longer visualized. Soft tissue density area in the pelvis now measures 6.4 x 3.9 cm. Musculoskeletal: No acute bony abnormality. IMPRESSION: Previously seen air and fluid collection in the pelvis has slightly decreased in size and air is no longer visualized. Large stool burden throughout the colon. Resolved right pleural effusion. Moderate hiatal hernia. 9 mm right lower lobe pulmonary nodule. Recommend attention on follow-up imaging. Electronically Signed   By: Franky Crease M.D.   On: 10/02/2023 23:21     Echo mild to moderate reduced left ventricular function  TELEMETRY: Sinus rhythm at 90:  ASSESSMENT AND PLAN:  Principal Problem:   Acute hypoxic respiratory failure (HCC) Active Problems:   Anxiety and depression   Chronic GERD   Hypothyroidism   Seizures (HCC)   Anemia   Diabetes mellitus without complication (HCC)   Aphasia following other cerebrovascular disease   Pneumonia of both lungs due to infectious organism   Schizophreniform disorder (HCC)   UTI (urinary tract infection)   CHF exacerbation (HCC)   PVD (peripheral vascular disease) (HCC)   History of cerebral palsy    Plan Pneumonia with sepsis improved continue current respiratory support pulmonary support antibiotics as necessary inhalers History of congestive heart failure reasonably compensated now continue current management Peripheral vascular disease stable no intervention necessary History of schizophrenia continue current management History of reflux agree with PPIs as necessary   Cara JONETTA Lovelace, MD, 10/04/2023 8:31 AM

## 2023-10-04 NOTE — Progress Notes (Signed)
 Daily Progress Note   Patient Name: Michele James       Date: 10/04/2023 DOB: [birth year]  Age: 68 y.o. MRN#: [id number] Attending Physician: Kandis Devaughn Sayres, MD Primary Care Physician: Glover Lenis, MD Admit Date: 09/25/2023  Reason for Consultation/Follow-up: Establishing goals of care  HPI/Brief Hospital Review: 69 y.o. female  with past medical history of cerebral palsy, schizophrenia, aphasia, seizure disorder, iron  deficiency anemia due to chronic blood loss, T2DM, hypothyroidism, HLD admitted from home on 09/25/2023 with acute hypoxic respiratory failure due to acute decompensated HFrEF, sepsis secondary to aspiration PNA and UTI.   Required mechanical ventilation 6/29 due to worsening of acute hypoxic respiratory failure, successfully extubated 7/1-remains on HHFNC   Echo from this admission revealed EF 35-40%, moderate to severe RV systolic dysfunction and severely elevated PASP--diuresing with IV lasix , plan to repeat echo once stabilized and potential RHC as outpatient   Palliative medicine was consulted for assisting with goals of care conversations.  Subjective: Extensive chart review has been completed prior to meeting patient including labs, vital signs, imaging, progress notes, orders, and available advanced directive documents from current and previous encounters.    Per chart review, hospice liaison in contact with Michele James 7/5 and agreed with hospice services in the home at discharge.  Visited with Michele James at her bedside. Nursing staff at bedside assisting her with hygiene. Spoke with Michele James at bedside. Michele James confirms conversations had with hospice liaison. Answered questions related to discharge planning and expectations. Anticipating equipment delivery to  his home including oxygen.  Assessed Michele James for symptoms. She denies acute pain or discomfort, resting well and appetite remains unchanged.  Anticipate discharge home in the next 24-48 hours. Answered and addressed all questions and concerns. PMT to step away from daily visits as goals/plans are clear, please reengage if needs or concerns arise.  Thank you for allowing the Palliative Medicine Team to assist in the care of this patient.  Total time:  25 minutes  Time spent includes: Detailed review of medical records (labs, imaging, vital signs), medically appropriate exam (mental status, respiratory, cardiac, skin), discussed with treatment team, counseling and educating patient, family and staff, documenting clinical information, medication management and coordination of care.  Waddell Lesches, DNP, AGNP-C Palliative Medicine   Please contact Palliative Medicine Team phone at [phone]  for questions and concerns.

## 2023-10-04 NOTE — Progress Notes (Addendum)
 [facility] Liaison Note  Attending physician reported that patient will dc home tomorrow with the need for home 02.   HL spoke with patient's father, who is agreeable to dc date.  Father reported that patient will need home 02 and a new WC delivered.  DME to be delivered to the home tomorrow before patient discharges home via [facility] transport. Hospital team aware.    Please call with any hospice related questions or concerns.  Thank you for the opportunity to participate in this patient's care  [facility] [phone]

## 2023-10-05 ENCOUNTER — Other Ambulatory Visit: Payer: Self-pay

## 2023-10-05 ENCOUNTER — Encounter: Payer: Self-pay | Admitting: Oncology

## 2023-10-05 DIAGNOSIS — J9601 Acute respiratory failure with hypoxia: Secondary | ICD-10-CM | POA: Diagnosis not present

## 2023-10-05 DIAGNOSIS — R569 Unspecified convulsions: Secondary | ICD-10-CM | POA: Diagnosis not present

## 2023-10-05 LAB — GLUCOSE, CAPILLARY
Glucose-Capillary: 107 mg/dL — ABNORMAL HIGH (ref 70–99)
Glucose-Capillary: 171 mg/dL — ABNORMAL HIGH (ref 70–99)
Glucose-Capillary: 121 mg/dL — ABNORMAL HIGH (ref 70–99)

## 2023-10-05 LAB — BASIC METABOLIC PANEL WITH GFR
Anion gap: 7 (ref 5–15)
BUN: 23 mg/dL (ref 8–23)
CO2: 43 mmol/L — ABNORMAL HIGH (ref 22–32)
Calcium: 8.8 mg/dL — ABNORMAL LOW (ref 8.9–10.3)
Chloride: 86 mmol/L — ABNORMAL LOW (ref 98–111)
Creatinine, Ser: 0.5 mg/dL (ref 0.44–1.00)
GFR, Estimated: >60 mL/min (ref >60)
Glucose, Bld: 106 mg/dL — ABNORMAL HIGH (ref 70–99)
Potassium: 3.5 mmol/L (ref 3.5–5.1)
Sodium: 136 mmol/L (ref 135–145)

## 2023-10-05 MED ORDER — FUROSEMIDE 20 MG PO TABS
40.0000 mg | ORAL_TABLET | Freq: Every day | ORAL | 1 refills | Status: AC
  Filled 2023-10-05: qty 180, 90d supply, fill #0
  Filled 2024-03-07 (×2): qty 180, 90d supply, fill #1

## 2023-10-05 MED ORDER — AMOXICILLIN-POT CLAVULANATE 875-125 MG PO TABS
1.0000 | ORAL_TABLET | Freq: Two times a day (BID) | ORAL | 0 refills | Status: AC
  Filled 2023-10-05: qty 28, 14d supply, fill #0

## 2023-10-05 NOTE — Progress Notes (Signed)
 [facility] CLINIC CARDIOLOGY PROGRESS NOTE       Patient ID: Michele James MRN: [id number] DOB/AGE: 1956-03-21 68 y.o.  Admit date: 09/25/2023 Referring Physician Dr. Malka Primary Physician Glover Lenis, MD Primary Cardiologist None Reason for Consultation New onset CHF with rEF  HPI: Michele James is a 68 y.o. female  with a past medical history of type 2 diabetes mellitus, GERD, hypothyroidism, seizures, cerebral palsy, PVD who presented to the ED on 09/25/2023 for SOB, productive cough and bilateral lower extremity swelling. Patient admitted due to new onset CHF and CAP. On 06/29 rapid response called for acute hypoxic respiratory failure and AMS. Patient transferred to ICU and required intubation for airway protection. Cardiology was consulted for further evaluation of new onset HFrEF.  Interval History: -Patient seen and examined this AM. Patient states she feels okay and states SOB is better. Patient denies chest pain. -BP remains borderline, HR stable. Overnight Tele showed no significant events.  -Electrolytes and renal function remains stable this AM.  -Patient is excited to go home.   Review of systems complete and found to be negative unless listed above    Past Medical History:  Diagnosis Date   Anemia    Arthritis    knees   Cerebral palsy (HCC)    Depression    Diabetes mellitus without complication (HCC)    Diverticulosis    Dysphagia    Eczema    GERD (gastroesophageal reflux disease)    Glaucoma    Hemorrhoids    Hyperlipidemia    Hypothyroidism    Seizures (HCC)    none in over 10 yrs    Past Surgical History:  Procedure Laterality Date   BREAST BIOPSY Right 03/22/2020   stereo bx, ribbon clip,  FAT NECROSIS AND FIBROSIS   CATARACT EXTRACTION W/PHACO Right 02/06/2016   Procedure: CATARACT EXTRACTION PHACO AND INTRAOCULAR LENS PLACEMENT (IOC);  Surgeon: Dene Etienne, MD;  Location: [facility];  Service: Ophthalmology;   Laterality: Right;   CATARACT EXTRACTION W/PHACO Left 03/05/2016   Procedure: CATARACT EXTRACTION PHACO AND INTRAOCULAR LENS PLACEMENT (IOC);  Surgeon: Dene Etienne, MD;  Location: [facility];  Service: Ophthalmology;  Laterality: Left;   CHOLECYSTECTOMY N/A 11/16/2015   Procedure: LAPAROSCOPIC CHOLECYSTECTOMY;  Surgeon: Elgin Laurence III, MD;  Location: [facility] ORS;  Service: General;  Laterality: N/A;  attempted cholangiogram   COLONOSCOPY WITH PROPOFOL  N/A 12/25/2014   Procedure: COLONOSCOPY WITH PROPOFOL ;  Surgeon: Deward CINDERELLA Piedmont, MD;  Location: [facility] ENDOSCOPY;  Service: Gastroenterology;  Laterality: N/A;   COLONOSCOPY WITH PROPOFOL  N/A 04/14/2019   Procedure: COLONOSCOPY WITH PROPOFOL ;  Surgeon: Toledo, Ladell POUR, MD;  Location: [facility] ENDOSCOPY;  Service: Gastroenterology;  Laterality: N/A;   COLONOSCOPY WITH PROPOFOL  N/A 04/20/2022   Procedure: COLONOSCOPY WITH PROPOFOL ;  Surgeon: Therisa Bi, MD;  Location: [facility] END[facility]COPY;  Service: Gastroenterology;  Laterality: N/A;   ENDOSCOPIC RETROGRADE CHOLANGIOPANCREATOGRAPHY (ERCP) WITH PROPOFOL  N/A 11/18/2015   Procedure: ENDOSCOPIC RETROGRADE CHOLANGIOPANCREATOGRAPHY (ERCP) WITH PROPOFOL ;  Surgeon: Rogelia Copping, MD;  Location: [facility] ENDOSCOPY;  Service: Endoscopy;  Laterality: N/A;   ESOPHAGOGASTRODUODENOSCOPY (EGD) WITH PROPOFOL  N/A 04/14/2019   Procedure: ESOPHAGOGASTRODUODENOSCOPY (EGD) WITH PROPOFOL ;  Surgeon: Toledo, Ladell POUR, MD;  Location: [facility] ENDOSCOPY;  Service: Gastroenterology;  Laterality: N/A;   ESOPHAGOGASTRODUODENOSCOPY (EGD) WITH PROPOFOL  N/A 04/20/2022   Procedure: ESOPHAGOGASTRODUODENOSCOPY (EGD) WITH PROPOFOL ;  Surgeon: Therisa Bi, MD;  Location: [facility] ENDOSCOPY;  Service: Gastroenterology;  Laterality: N/A;   EYE SURGERY     Cataract Surgery    HERNIA REPAIR  Medications Prior to Admission  Medication Sig Dispense Refill Last Dose/Taking   acetaminophen  (TYLENOL ) 325 MG tablet Take 325 mg by mouth every 6 (six)  hours as needed.   Unknown   alendronate  (FOSAMAX ) 70 MG tablet Take 70 mg by mouth once a week. Take with a full glass of water on an empty stomach.   Past Week   ALPRAZolam  (XANAX ) 0.25 MG tablet Take 0.25 mg by mouth as needed for anxiety. Before pap smears   Unknown   atorvastatin  (LIPITOR) 40 MG tablet Take 40 mg by mouth daily.   09/24/2023   calcium  citrate-vitamin D  (CITRACAL+D) 315-200 MG-UNIT per tablet Take 1 tablet by mouth 2 (two) times daily.   09/24/2023   carbamazepine  (TEGRETOL ) 200 MG tablet Take 200 mg by mouth 3 (three) times daily.   09/24/2023   Cholecalciferol  100 MCG (4000 UT) TABS Take 1,000 Units by mouth.   09/24/2023   esomeprazole (NEXIUM) 40 MG capsule Take 40 mg by mouth daily at 12 noon.   09/24/2023   famotidine  (PEPCID ) 20 MG tablet Take 20 mg by mouth 2 (two) times daily.   09/24/2023   hydroxypropyl methylcellulose / hypromellose (ISOPTO TEARS / GONIOVISC) 2.5 % ophthalmic solution 1 drop as needed for dry eyes.   Unknown   iron  polysaccharides (NIFEREX) 150 MG capsule Take 1 capsule (150 mg total) by mouth 2 (two) times daily for 30 days, THEN 1 capsule (150 mg total) daily. 150 capsule 0 09/24/2023   levothyroxine  (SYNTHROID ) 100 MCG tablet Take 100 mcg by mouth every morning.   09/24/2023   magnesium  oxide (MAG-OX) 400 (240 Mg) MG tablet Take 1 tablet by mouth 3 (three) times daily.   09/24/2023   naproxen (NAPROSYN) 375 MG tablet Take 375 mg by mouth 2 (two) times daily with a meal.   09/24/2023   OLANZapine  (ZYPREXA ) 10 MG tablet Take 1 tablet (10 mg total) by mouth at bedtime. (Patient taking differently: Take 10 mg by mouth at bedtime. Take 10 mg in addition to 15 mg tablet for total 25 mg at bedtime) 90 tablet 0 09/24/2023   OLANZapine  (ZYPREXA ) 15 MG tablet Take 1 tablet (15 mg total) by mouth at bedtime. (Patient taking differently: Take 15 mg by mouth at bedtime. Take 15 mg in addition to one 10 mg tablet for total 25 mg at bedtime) 90 tablet 0 09/24/2023    OLANZapine  (ZYPREXA ) 5 MG tablet Take 1 tablet (5 mg total) by mouth daily. 90 tablet 0 09/24/2023 Morning   PARoxetine  (PAXIL ) 20 MG tablet TAKE 1 TABLET BY MOUTH EVERY DAY 90 tablet 1 09/24/2023   potassium chloride  20 MEQ/15ML (10%) SOLN Take 10 mEq by mouth daily.   09/24/2023   vitamin E 400 UNIT capsule Take 400 Units by mouth daily.   09/24/2023   Social History   Socioeconomic History   Marital status: Single    Spouse name: Not on file   Number of children: Not on file   Years of education: Not on file   Highest education level: Not on file  Occupational History   Not on file  Tobacco Use   Smoking status: Former    Current packs/day: 0.00    Types: Cigarettes    Quit date: 06/20/1980    Years since quitting: 43.3   Smokeless tobacco: Never   Tobacco comments:    smoked socially in early 1980s (reports for 4 months)  Vaping Use   Vaping status: Never Used  Substance and Sexual  Activity   Alcohol  use: No   Drug use: No   Sexual activity: Not Currently  Other Topics Concern   Not on file  Social History Narrative   Not on file   Social Drivers of Health   Financial Resource Strain: Low Risk  (09/28/2023)   Overall Financial Resource Strain (CARDIA)    Difficulty of Paying Living Expenses: Not hard at all  Food Insecurity: No Food Insecurity (09/28/2023)   Hunger Vital Sign    Worried About Running Out of Food in the Last Year: Never true    Ran Out of Food in the Last Year: Never true  Transportation Needs: No Transportation Needs (09/28/2023)   PRAPARE - Administrator, Civil Service (Medical): No    Lack of Transportation (Non-Medical): No  Physical Activity: Not on file  Stress: Not on file  Social Connections: Unknown (09/26/2023)   Social Connection and Isolation Panel    Frequency of Communication with Friends and Family: Patient unable to answer    Frequency of Social Gatherings with Friends and Family: Patient unable to answer    Attends  Religious Services: Patient unable to answer    Active Member of Clubs or Organizations: Patient unable to answer    Attends Banker Meetings: Patient unable to answer    Marital Status: Never married  Intimate Partner Violence: Patient Unable To Answer (09/26/2023)   Humiliation, Afraid, Rape, and Kick questionnaire    Fear of Current or Ex-Partner: Patient unable to answer    Emotionally Abused: Patient unable to answer    Physically Abused: Patient unable to answer    Sexually Abused: Patient unable to answer    Family History  Problem Relation Age of Onset   High blood pressure Mother    Hyperlipidemia Mother    Diabetes Mellitus II Mother    Stroke Mother    Osteoporosis Mother    High blood pressure Father    Breast cancer Maternal Aunt    Stroke Maternal Grandfather    Diabetes Mellitus II Maternal Grandfather    Stroke Maternal Grandmother    Diabetes Mellitus II Maternal Grandmother      Vitals:   10/05/23 0349 10/05/23 0418 10/05/23 0851 10/05/23 1151  BP:  (!) 112/58 105/76 (!) 99/51  Pulse:  89 87 (!) 104  Resp:  20    Temp:  98.4 F (36.9 C) 98.7 F (37.1 C) 98.4 F (36.9 C)  TempSrc:      SpO2:  92% 92% 92%  Weight: (!) 145.4 kg     Height:        PHYSICAL EXAM General: Chronically ill-appearing elderly female, well nourished, in no acute distress. HEENT: Normocephalic and atraumatic. Neck: No JVD.   Lungs: Clear breath sounds bilaterally, normal effort. Diminished breath sounds (improving).  Heart: HRRR. Normal S1 and S2 without gallops or murmurs.  Abdomen: Non-distended appearing.  Msk: Normal strength and tone for age. Extremities: Warm and well perfused. No clubbing, cyanosis, edema.  Labs: Basic Metabolic Panel: Recent Labs    10/03/23 0524 10/05/23 0450  NA 137 136  K 3.6 3.5  CL 88* 86*  CO2 36* 43*  GLUCOSE 122* 106*  BUN 16 23  CREATININE 0.44 0.50  CALCIUM  8.5* 8.8*  MG 2.1  --   PHOS 3.1  --    Liver Function  Tests: No results for input(s): AST, ALT, ALKPHOS, BILITOT, PROT, ALBUMIN in the last 72 hours.  No results for input(s): LIPASE,  AMYLASE in the last 72 hours. CBC: No results for input(s): WBC, NEUTROABS, HGB, HCT, MCV, PLT in the last 72 hours.  Cardiac Enzymes: No results for input(s): CKTOTAL, CKMB, CKMBINDEX, TROPONINIHS in the last 72 hours.  BNP: No results for input(s): BNP in the last 72 hours.  D-Dimer: No results for input(s): DDIMER in the last 72 hours. Hemoglobin A1C: No results for input(s): HGBA1C in the last 72 hours.  Fasting Lipid Panel: No results for input(s): CHOL, HDL, LDLCALC, TRIG, CHOLHDL, LDLDIRECT in the last 72 hours.  Thyroid  Function Tests: No results for input(s): TSH, T4TOTAL, T3FREE, THYROIDAB in the last 72 hours.  Invalid input(s): FREET3 Anemia Panel: No results for input(s): VITAMINB12, FOLATE, FERRITIN, TIBC, IRON , RETICCTPCT in the last 72 hours.   Radiology: EEG adult Result Date: 10/05/2023 Shelton Arlin KIDD, MD     10/05/2023  9:42 AM Patient Name: JEMMA RASP MRN: [id number] Epilepsy Attending: Arlin KIDD Shelton Referring Physician/Provider: Kandis Devaughn Sayres, MD Date: 10/01/2023 Duration: 28.31 mins Patient history: 68yo F with ams. EEG to evaluate for seizure Level of alertness: Awake AEDs during EEG study: None Technical aspects: This EEG study was done with scalp electrodes positioned according to the 10-20 International system of electrode placement. Electrical activity was reviewed with band pass filter of 1-70Hz , sensitivity of 7 uV/mm, display speed of 39mm/sec with a 60Hz  notched filter applied as appropriate. EEG data were recorded continuously and digitally stored.  Video monitoring was available and reviewed as appropriate. Description: EEG showed primary generalized predominantly 6 to 9 Hz theta-alpha activity admixed with 13 to 15 Hz beta activity.   Hyperventilation and photic stimulation were not performed.   ABNORMALITY - Continuous slow, generalized IMPRESSION: This study is suggestive of mild diffuse encephalopathy.  No seizures or epileptiform discharges were seen throughout the recording. Arlin KIDD Shelton   CT ABDOMEN PELVIS W CONTRAST Result Date: 10/02/2023 CLINICAL DATA:  Fluid collection in pelvis seen on prior CT EXAM: CT ABDOMEN AND PELVIS WITH CONTRAST TECHNIQUE: Multidetector CT imaging of the abdomen and pelvis was performed using the standard protocol following bolus administration of intravenous contrast. RADIATION DOSE REDUCTION: This exam was performed according to the departmental dose-optimization program which includes automated exposure control, adjustment of the mA and/or kV according to patient size and/or use of iterative reconstruction technique. CONTRAST:  OMNIPAQUE  IOHEXOL  300 MG/ML  SOLN COMPARISON:  09/22/2023 FINDINGS: Lower chest: Right lower lobe pulmonary nodule measures 9 mm. Previously seen right pleural effusion has resolved. No current pleural effusions. Moderate-sized hiatal hernia with compressive atelectasis in the adjacent left lower lobe. Dependent atelectasis in the right lower lobe. Hepatobiliary: No focal liver abnormality is seen. Status post cholecystectomy. No biliary dilatation. Pancreas: No focal abnormality or ductal dilatation. Spleen: No focal abnormality.  Normal size. Adrenals/Urinary Tract: Foley catheter present in the bladder. No renal or adrenal mass. No hydronephrosis. Stomach/Bowel: Large stool burden throughout the colon. Decreasing stool burden in the rectum since prior study. Stomach and small bowel decompressed. No bowel obstruction or inflammatory process. Vascular/Lymphatic: No evidence of aneurysm or adenopathy. Reproductive: Prior hysterectomy Other: Previously seen air and fluid collection in the pelvis has decreased in size. Air is no longer visualized. Soft tissue density area in  the pelvis now measures 6.4 x 3.9 cm. Musculoskeletal: No acute bony abnormality. IMPRESSION: Previously seen air and fluid collection in the pelvis has slightly decreased in size and air is no longer visualized. Large stool burden throughout the colon. Resolved right pleural effusion.  Moderate hiatal hernia. 9 mm right lower lobe pulmonary nodule. Recommend attention on follow-up imaging. Electronically Signed   By: Franky Crease M.D.   On: 10/02/2023 23:21   DG Chest Port 1 View Result Date: 10/01/2023 CLINICAL DATA:  Hypoxia EXAM: PORTABLE CHEST 1 VIEW COMPARISON:  X-ray 09/30/2023 FINDINGS: Hyperinflation. Slightly elevated right hemidiaphragm. Decreasing basilar atelectasis. Tiny pleural effusion. Kyphotic x-ray. Limited evaluation of the apices. No consolidation or edema. Previous left IJ catheter no longer identified. No definite pneumothorax. Known hiatal hernia. IMPRESSION: Decreasing basilar atelectasis. Removal of left IJ catheter. No obvious pneumothorax. Electronically Signed   By: Ranell Bring M.D.   On: 10/01/2023 11:12   DG Chest Port 1 View Result Date: 09/30/2023 CLINICAL DATA:  Shortness of breath. EXAM: PORTABLE CHEST 1 VIEW COMPARISON:  09/27/2023. FINDINGS: Stable left IJ CVC catheter tip at the superior cavoatrial junction. The heart size and mediastinal contours are within normal limits. Small bilateral layering pleural effusions with decreased patchy bibasilar opacities and improved aeration at the lung bases. No pneumothorax. The visualized skeletal structures are unchanged. IMPRESSION: Small bilateral pleural effusions with decreased patchy bibasilar opacities and improved aeration at the lung bases. Electronically Signed   By: Harrietta Sherry M.D.   On: 09/30/2023 08:36   ECHOCARDIOGRAM COMPLETE Result Date: 09/27/2023    ECHOCARDIOGRAM REPORT   Patient Name:   DARLEEN MOFFITT Date of Exam: 09/27/2023 Medical Rec #:  [id number]      Height:       63.0 in Accession #:    [id number]      Weight:       181.2 lb Date of Birth:  [birth year]      BSA:          1.854 m Patient Age:    68 years       BP:           104/56 mmHg Patient Gender: F              HR:           62 bpm. Exam Location:  [facility] Procedure: 2D Echo, Color Doppler, Cardiac Doppler and Strain Analysis (Both            Spectral and Color Flow Doppler were utilized during procedure). Indications:     I50.31 Acute Diastolic CHF  History:         Patient has prior history of Echocardiogram examinations. Risk                  Factors:Hypertension and Dyslipidemia.  Sonographer:     L. Thornton-Maynard Referring Phys:  [id number] CAMILA A THOMAS Diagnosing Phys: Redell Cave MD  Sonographer Comments: Echo performed with patient supine and on artificial respirator. Global longitudinal strain was attempted. IMPRESSIONS  1. Left ventricular ejection fraction, by estimation, is 35 to 40%. The left ventricle has moderately decreased function. The left ventricle demonstrates global hypokinesis. Left ventricular diastolic parameters are consistent with Grade I diastolic dysfunction (impaired relaxation). There is the interventricular septum is flattened in systole and diastole, consistent with right ventricular pressure and volume overload.  2. Right ventricular systolic function is moderately reduced. The right ventricular size is mildly enlarged. There is severely elevated pulmonary artery systolic pressure. The estimated right ventricular systolic pressure is 61.1 mmHg.  3. Right atrial size was severely dilated.  4. The mitral valve is normal in structure. No evidence of mitral valve regurgitation.  5. The aortic valve is tricuspid. Aortic valve regurgitation  is mild. Aortic valve sclerosis is present, with no evidence of aortic valve stenosis. FINDINGS  Left Ventricle: Left ventricular ejection fraction, by estimation, is 35 to 40%. The left ventricle has moderately decreased function. The left ventricle demonstrates global hypokinesis. The  left ventricular internal cavity size was normal in size. There is no left ventricular hypertrophy. The interventricular septum is flattened in systole and diastole, consistent with right ventricular pressure and volume overload. Left ventricular diastolic parameters are consistent with Grade I diastolic dysfunction (impaired relaxation). Right Ventricle: The right ventricular size is mildly enlarged. No increase in right ventricular wall thickness. Right ventricular systolic function is moderately reduced. There is severely elevated pulmonary artery systolic pressure. The tricuspid regurgitant velocity is 3.81 m/s, and with an assumed right atrial pressure of 3 mmHg, the estimated right ventricular systolic pressure is 61.1 mmHg. Left Atrium: Left atrial size was normal in size. Right Atrium: Right atrial size was severely dilated. Pericardium: There is no evidence of pericardial effusion. Mitral Valve: The mitral valve is normal in structure. No evidence of mitral valve regurgitation. MV peak gradient, 2.9 mmHg. The mean mitral valve gradient is 1.0 mmHg. Tricuspid Valve: The tricuspid valve is normal in structure. Tricuspid valve regurgitation is mild. Aortic Valve: The aortic valve is tricuspid. Aortic valve regurgitation is mild. Aortic valve sclerosis is present, with no evidence of aortic valve stenosis. Aortic valve mean gradient measures 2.0 mmHg. Aortic valve peak gradient measures 3.3 mmHg. Aortic valve area, by VTI measures 2.43 cm. Pulmonic Valve: The pulmonic valve was grossly normal. Pulmonic valve regurgitation is not visualized. Aorta: The aortic root and ascending aorta are structurally normal, with no evidence of dilitation. Venous: IVC assessment for right atrial pressure unable to be performed due to mechanical ventilation. IAS/Shunts: No atrial level shunt detected by color flow Doppler.  LEFT VENTRICLE PLAX 2D LVIDd:         3.70 cm     Diastology LVIDs:         2.90 cm     LV e' medial:     5.70 cm/s LV PW:         0.90 cm     LV E/e' medial:  7.6 LV IVS:        0.90 cm     LV e' lateral:   6.83 cm/s LVOT diam:     1.90 cm     LV E/e' lateral: 6.4 LV SV:         45 LV SV Index:   24 LVOT Area:     2.84 cm  LV Volumes (MOD) LV vol d, MOD A2C: 33.5 ml LV vol d, MOD A4C: 35.8 ml LV vol s, MOD A2C: 17.4 ml LV vol s, MOD A4C: 11.8 ml LV SV MOD A2C:     16.1 ml LV SV MOD A4C:     35.8 ml LV SV MOD BP:      20.5 ml RIGHT VENTRICLE            IVC RV Basal diam:  4.40 cm    IVC diam: 2.10 cm RV Mid diam:    2.90 cm RV S prime:     8.15 cm/s TAPSE (M-mode): 0.7 cm LEFT ATRIUM             Index        RIGHT ATRIUM           Index LA diam:        3.30 cm 1.78 cm/m  RA Area:     24.80 cm LA Vol (A2C):   32.5 ml 17.53 ml/m  RA Volume:   97.20 ml  52.42 ml/m LA Vol (A4C):   26.3 ml 14.18 ml/m LA Biplane Vol: 30.2 ml 16.29 ml/m  AORTIC VALVE                    PULMONIC VALVE AV Area (Vmax):    2.58 cm     PV Vmax:       0.62 m/s AV Area (Vmean):   2.21 cm     PV Peak grad:  1.6 mmHg AV Area (VTI):     2.43 cm AV Vmax:           90.85 cm/s AV Vmean:          63.650 cm/s AV VTI:            0.184 m AV Peak Grad:      3.3 mmHg AV Mean Grad:      2.0 mmHg LVOT Vmax:         82.70 cm/s LVOT Vmean:        49.600 cm/s LVOT VTI:          0.158 m LVOT/AV VTI ratio: 0.86  AORTA Ao Root diam: 3.30 cm Ao Asc diam:  3.20 cm MITRAL VALVE               TRICUSPID VALVE MV Area (PHT): 2.87 cm    TR Peak grad:   58.1 mmHg MV Area VTI:   2.02 cm    TR Vmax:        381.00 cm/s MV Peak grad:  2.9 mmHg MV Mean grad:  1.0 mmHg    SHUNTS MV Vmax:       0.85 m/s    Systemic VTI:  0.16 m MV Vmean:      42.8 cm/s   Systemic Diam: 1.90 cm MV Decel Time: 264 msec MV E velocity: 43.50 cm/s MV A velocity: 75.50 cm/s MV E/A ratio:  0.58 Redell Cave MD Electronically signed by Redell Cave MD Signature Date/Time: 09/27/2023/7:23:05 PM    Final    DG Abd 1 View Result Date: 09/27/2023 CLINICAL DATA:  [id number] Encounter for  imaging study to confirm orogastric (OG) tube placement [id number] EXAM: ABDOMEN - 1 VIEW COMPARISON:  Earlier film of the same day, CT 09/22/2023 FINDINGS: Gastric tube overlies the medial left infrahilar chest and left upper abdomen corresponding to hiatal hernia seen on prior CT. The stomach is nondistended. visualized small bowel and colon normal. Lumbar levoscoliosis with multilevel spondylitic change. IMPRESSION: Gastric tube extends to the level of hiatal hernia. Electronically Signed   By: JONETTA Faes M.D.   On: 09/27/2023 10:30   DG Abd 1 View Result Date: 09/27/2023 CLINICAL DATA:  [id number]. Encounter for study to confirm nasogastric tube placement. EXAM: ABDOMEN - 1 VIEW COMPARISON:  PET-CT 07/02/2020, abdomen x-rays 10/21/2022 FINDINGS: There is A moderate sized hiatal hernia. NGT passes into the stomach below the diaphragm and is probably in the distal body of stomach. The bowel pattern is nonobstructive with moderate retained stool. The pelvis was excluded from the exam. There is no semi supine evidence of free air. No other significant radiographic findings. Advanced degenerative change lumbar spine. IMPRESSION: 1. NGT passes into the stomach below the diaphragm and is probably in the distal body of stomach. 2. Moderate sized hiatal hernia. 3. Nonobstructive bowel pattern with moderate retained stool. Electronically Signed  By: Francis Quam M.D.   On: 09/27/2023 05:11   Portable Chest x-ray Result Date: 09/27/2023 CLINICAL DATA:  Central line placement EXAM: PORTABLE CHEST 1 VIEW COMPARISON:  Same day chest radiograph FINDINGS: Endotracheal tube tip in the mid intrathoracic trachea. Left IJ CVC tip in the right atrium. No pneumothorax. Similar patchy bilateral airspace opacities and layering pleural effusions. Stable cardiomediastinal silhouette. Aortic atherosclerotic calcification. IMPRESSION: 1. Left IJ CVC tip in the right atrium. No pneumothorax. 2. Similar patchy bilateral airspace opacities  and layering pleural effusions. Electronically Signed   By: Norman Gatlin M.D.   On: 09/27/2023 02:10   DG Chest Port 1 View Result Date: 09/27/2023 CLINICAL DATA:  Endotracheal tube placement EXAM: PORTABLE CHEST 1 VIEW COMPARISON:  09/25/2023 FINDINGS: Endotracheal tube tip in the mid intrathoracic trachea. Low lung volumes. Stable cardiomediastinal silhouette. Aortic atherosclerotic calcification. Diffuse bronchial wall thickening compatible with bronchitis. Small bilateral pleural effusions and basilar airspace opacities. No pneumothorax. No displaced rib fracture. Remote left rib fractures. IMPRESSION: 1. Endotracheal tube tip in the mid intrathoracic trachea. 2. Small bilateral pleural effusions and basilar airspace opacities, atelectasis versus pneumonia. 3. Bronchitis. Electronically Signed   By: Norman Gatlin M.D.   On: 09/27/2023 00:51   CT Angio Chest PE W/Cm &/Or Wo Cm Result Date: 09/25/2023 CLINICAL DATA:  Pulmonary embolism (PE) suspected, high prob, dyspnea EXAM: CT ANGIOGRAPHY CHEST WITH CONTRAST TECHNIQUE: Multidetector CT imaging of the chest was performed using the standard protocol during bolus administration of intravenous contrast. Multiplanar CT image reconstructions and MIPs were obtained to evaluate the vascular anatomy. RADIATION DOSE REDUCTION: This exam was performed according to the departmental dose-optimization program which includes automated exposure control, adjustment of the mA and/or kV according to patient size and/or use of iterative reconstruction technique. CONTRAST:  75mL OMNIPAQUE  IOHEXOL  350 MG/ML SOLN COMPARISON:  Chest radiograph from earlier today. 10/18/2022 chest CT angiogram. FINDINGS: Cardiovascular: Streaky indistinct areas of low-attenuation throughout the main, right and left pulmonary arteries, favor mixing artifact. No convincing filling defects in the pulmonary arteries to suggest pulmonary embolism. Atherosclerotic nonaneurysmal thoracic aorta.  Normal caliber pulmonary arteries. Top-normal heart size. No significant pericardial fluid/thickening. Mediastinum/Nodes: No significant thyroid  nodules. Unremarkable esophagus. No pathologically enlarged axillary, mediastinal or hilar lymph nodes. Lungs/Pleura: No pneumothorax. Small to moderate right and small left layering pleural effusions. Moderate dependent bibasilar atelectasis. Mosaic attenuation throughout both lungs. Indistinct tiny 0.3 cm posterior right upper lobe solid pulmonary nodule on series 5/image 69, stable. No new significant pulmonary nodules in the aerated portions of the lungs. No acute consolidative airspace disease. Upper abdomen: No acute abnormality. Musculoskeletal: No aggressive appearing focal osseous lesions. Marked thoracic spondylosis. Review of the MIP images confirms the above findings. IMPRESSION: 1. No convincing evidence of pulmonary embolism. Streaky indistinct areas of low-attenuation throughout the main, right and left pulmonary arteries, favor mixing artifact. 2. Small to moderate right and small left layering pleural effusions. Moderate dependent bibasilar atelectasis. 3. Mosaic attenuation throughout both lungs, as can be seen with air trapping from small airways disease versus mosaic perfusion from pulmonary vascular disease. 4.  Aortic Atherosclerosis (ICD10-I70.0). Electronically Signed   By: Selinda DELENA Blue M.D.   On: 09/25/2023 10:54   DG Chest 1 View Result Date: 09/25/2023 CLINICAL DATA:  Shortness of breath and edema. EXAM: CHEST  1 VIEW COMPARISON:  AP Lat chest 02/06/2023 FINDINGS: Low inspiration on exam compared to the prior study. There are small pleural effusions. There is patchy consolidation or atelectasis in both lower  lung fields. Superimposed chronic elevation right hemidiaphragm. There is a large hiatal hernia with stable mediastinum. Mild cardiomegaly without vascular prominence. There is aortic atherosclerosis. Degenerative change thoracic spine. No  new osseous findings. IMPRESSION: 1. Low inspiration. 2. Small pleural effusions. 3. Patchy consolidation or atelectasis in both lower lung fields. 4. Large hiatal hernia. 5. Aortic atherosclerosis. Electronically Signed   By: Francis Quam M.D.   On: 09/25/2023 07:53   CT ANGIO AO+BIFEM W & OR WO CONTRAST Result Date: 09/23/2023 CLINICAL DATA:  Lower extremity claudication with soft tissue ulceration EXAM: CT ANGIOGRAPHY OF ABDOMINAL AORTA WITH ILIOFEMORAL RUNOFF TECHNIQUE: Multidetector CT imaging of the abdomen, pelvis and lower extremities was performed using the standard protocol during bolus administration of intravenous contrast. Multiplanar CT image reconstructions and MIPs were obtained to evaluate the vascular anatomy. RADIATION DOSE REDUCTION: This exam was performed according to the departmental dose-optimization program which includes automated exposure control, adjustment of the mA and/or kV according to patient size and/or use of iterative reconstruction technique. CONTRAST:  OMNIPAQUE  IOHEXOL  350 MG/ML SOLN COMPARISON:  Prior PET-CT 07/02/2020 FINDINGS: VASCULAR Aorta: Normal caliber aorta without aneurysm, dissection, vasculitis or significant stenosis. Celiac: Patent without evidence of aneurysm, dissection, vasculitis or significant stenosis. SMA: Patent without evidence of aneurysm, dissection, vasculitis or significant stenosis. Renals: Both main renal arteries are patent without evidence of aneurysm, dissection, vasculitis, fibromuscular dysplasia or significant stenosis. Small accessory artery to the right kidney. IMA: Patent without evidence of aneurysm, dissection, vasculitis or significant stenosis. RIGHT Lower Extremity Inflow: Common, internal and external iliac arteries are patent without evidence of aneurysm, dissection, vasculitis or significant stenosis. Outflow: Common, superficial and profunda femoral arteries and the popliteal artery are patent without evidence of  aneurysm, dissection, vasculitis or significant stenosis. Runoff: Patent three vessel runoff to the ankle. LEFT Lower Extremity Inflow: Common, internal and external iliac arteries are patent without evidence of aneurysm, dissection, vasculitis or significant stenosis. Outflow: Common, superficial and profunda femoral arteries and the popliteal artery are patent without evidence of aneurysm, dissection, vasculitis or significant stenosis. Runoff: Patent three vessel runoff to the ankle. Veins: No focal venous abnormality. Review of the MIP images confirms the above findings. NON-VASCULAR Lower chest: Marked enlargement of the main pulmonary artery at 3.2 cm suggesting pulmonary arterial hypertension. Cardiomegaly with right heart enlargement. No pericardial effusion. Moderate right and trace left pleural effusions. Large sliding hiatal hernia. Hepatobiliary: No focal liver abnormality is seen. Status post cholecystectomy. No biliary dilatation. Pancreas: Unremarkable. No pancreatic ductal dilatation or surrounding inflammatory changes. Spleen: Normal in size without focal abnormality. Adrenals/Urinary Tract: Adrenal glands are unremarkable. Kidneys are normal, without renal calculi, focal lesion, or hydronephrosis. Bladder is unremarkable. Stomach/Bowel: Large volume of formed stool in the rectum with a rectal stool ball measuring at least 9 x 7 x 6 cm. There is mild rectal wall thickening. No evidence of bowel obstruction. Normal appendix. Abnormal thickening of a distal loop of ileum in the low anatomic pelvis immediately adjacent to the large abscess collection. Lymphatic: No suspicious lymphadenopathy. Reproductive: Apparent postsurgical changes of hysterectomy. The uterus is no longer visualized although it was present on prior PET imaging from April of 2022. Other: Thick walled fluid and gas collection in the recto vesicular recess measures approximately 6.8 x 4.4 x 4.4 cm. Diffuse diastasis recti. Small fat  containing ventral abdominal hernia. 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 Inward rotation of the left lower extremity with severe degenerative arthritis. Marked reticulation of the  subcutaneous fat affecting the body wall and bilateral lower extremities consistent with edema. IMPRESSION: VASCULAR 1. No evidence of hemodynamically significant arterial stenosis or occlusion. No evidence of significant peripheral arterial disease. 2. Trace aortic atherosclerotic vascular calcifications. Aortic Atherosclerosis (ICD10-I70.0). NON-VASCULAR 1. Thick walled fluid and gas collection in the recto vesicular recess within the pelvic cul-de-sac consistent with an abscess measuring 6.8 x 4.4 x 4.4 cm. There is an associated adjacent thick walled loop of small bowel which may represent chronic secondary inflammatory reaction if the source of the abscess is related to a prior hysterectomy, a source of chronic small bowel perforation, or potentially even lymphomatous involvement of the small bowel with contained perforation. 2. Extensive anasarca and bilateral lower extremity edema. Findings are favored to be related to elevated right heart pressures in the setting of pulmonary arterial hypertension. 3. Moderate right and trace left pleural effusions. 4. Moderate to large sliding hiatal hernia. 5. 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 6. Inward rotation of the left lower extremity with advanced knee joint degenerative osteoarthritis. These results will be called to the ordering clinician or representative by the Radiologist Assistant, and communication documented in the PACS or [employer]. Electronically Signed   By: Wilkie Lent M.D.   On: 09/23/2023 09:04   VAS US  LOWER EXTREMITY ARTERIAL DUPLEX Result Date: 09/07/2023 LOWER EXTREMITY ARTERIAL DUPLEX STUDY Patient Name:  MEKIAH WAHLER  Date of Exam:   09/07/2023 Medical Rec #: [id number]        Accession #:    [id number] Date of Birth: 05-24-1955       Patient Gender: F Patient Age:   47 years Exam Location:  [facility] Procedure:      VAS US  LOWER EXTREMITY ARTERIAL DUPLEX Referring Phys: CORDELLA SHAWL --------------------------------------------------------------------------------  Indications: Ulceration.  Current ABI: not done Limitations: Patient is unable to stand; exam was performed with patient upright              in a wheelchair Comparison Study: ABI on 06/29/23: Right=1.27(TBI=0.97) & Left=1.28(TBI=0.93) Performing Technologist: [name] Churn RT, RDMS, RVT  Examination Guidelines: A complete evaluation includes B-mode imaging, spectral Doppler, color Doppler, and power Doppler as needed of all accessible portions of each vessel. Bilateral testing is considered an integral part of a complete examination. Limited examinations for reoccurring indications may be performed as noted.  +-----------+--------+-----+--------+---------+-------------+ RIGHT      PSV cm/sRatioStenosisWaveform Comments      +-----------+--------+-----+--------+---------+-------------+ CFA Mid                                  not evaluated +-----------+--------+-----+--------+---------+-------------+ SFA Prox                                 not evaluated +-----------+--------+-----+--------+---------+-------------+ SFA Mid    77                   biphasic               +-----------+--------+-----+--------+---------+-------------+ SFA Distal 73                   biphasic               +-----------+--------+-----+--------+---------+-------------+ POP Prox   70                   biphasic               +-----------+--------+-----+--------+---------+-------------+  POP Distal 64                   triphasic              +-----------+--------+-----+--------+---------+-------------+ ATA Distal 36                   biphasic                +-----------+--------+-----+--------+---------+-------------+ PTA Distal 62                   triphasic              +-----------+--------+-----+--------+---------+-------------+ PERO Distal30                   biphasic               +-----------+--------+-----+--------+---------+-------------+ Limited evaluation of the right lower extremity due to limited patient mobility.  +--------------+-------+-----------+--------+--------+-----+--------+ Left PoplitealAP (cm)Transv (cm)WaveformStenosisShapeComments +--------------+-------+-----------+--------+--------+-----+--------+ Distal                                                        +--------------+-------+-----------+--------+--------+-----+--------+  Summary: Right: Patent SFA, popliteal and tibial arteries without evidence of significant stenosis. No evidence of significant arterial occlusive disease in the proximal vessels, based on Doppler waveforms and previous ABI.  See table(s) above for measurements and observations. Electronically signed by Cordella Shawl MD on 09/07/2023 at 5:25:09 PM.   Final     ECHO as above  TELEMETRY reviewed by me 10/05/2023: Sinus rhythm, rate 80s  EKG reviewed by me: sinus tachycardia, rate 122 bpm with nonspecific ST-T wave changes.  Data reviewed by me 10/05/2023: last 24h vitals tele labs imaging I/O hospitalist progress notes, critical care notes.   Principal Problem:   Acute hypoxic respiratory failure (HCC) Active Problems:   Anxiety and depression   Chronic GERD   Hypothyroidism   Seizures (HCC)   Anemia   Diabetes mellitus without complication (HCC)   Aphasia following other cerebrovascular disease   Pneumonia of both lungs due to infectious organism   Schizophreniform disorder (HCC)   UTI (urinary tract infection)   CHF exacerbation (HCC)   PVD (peripheral vascular disease) (HCC)   History of cerebral palsy    ASSESSMENT AND PLAN:  Michele James is a 68 y.o. female   with a past medical history of type 2 diabetes mellitus, GERD, hypothyroidism, seizures, cerebral palsy, PVD who presented to the ED on 09/25/2023 for SOB, productive cough and bilateral lower extremity swelling. Patient admitted due to new onset CHF and CAP. On 06/29 rapid response called for acute hypoxic respiratory failure and AMS. Patient transferred to ICU and required intubation for airway protection. Cardiology was consulted for further evaluation of new onset HFrEF.  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 Patient intubated and sedated. Trops minimally elevated and flat 12 > 13 > 27 > 34. BNP elevated but improving 1400 > 960. EKG on 06/29 with sinus tachycardia, RVH,  rate 122 bpm, without acute ischemic changes.  CTA with no evidence of acute pulmonary embolism.  Echo this admission with new findings of reduced EF(35-40%), moderate  to severe RV systolic dysfunction, severely elevated PASP.  Prior echo on 09/2022 with preserved EF, no evidence of RV dysfunction or pulmonary hypertension. Patient extubated ands weaned off levo on 07/01. BP improving, still borderline.   -Transition IV to PO lasix  to 40 mg daily. Closely monitor renal function and UOP.  -Recommend goal MAP > 65. -Continue home atorvastatin  40 mg daily per tube.  -Continue to hold home losartan  due to low BP. -Plan to optimize GDMT if BP allows at outpatient follow-up. -Minimally elevated flat troponins in the setting of acute hypoxic respiratory failure, new onset moderate to severe RV dysfunction and acute HFrEF is most consistent with demand/supply ischemia and not ACS. -Plan to repeat echo after clinical status improves at outpatient follow-up, likely do outpatient RHC for further evaluation of new echo  findings this admission. -Consider outpatient ischemic evaluation. -Acute hypoxic respiratory failure, sepsis with suspected aspiration pneumonia management per critical care team. -Agree with palliative consult.   Appointment scheduled with Caralyn Hudson, PA-C for [facility] on 10/07/23 at 10 AM   This patient's plan of care was discussed and created with Dr. Florencio and he is in agreement.  Signed: Dorene Comfort, PA-C  10/05/2023, 11:56 AM [facility]

## 2023-10-05 NOTE — TOC Transition Note (Signed)
 Transition of Care [facility]) - Discharge Note   Patient Details  Name: Michele James MRN: [id number] Date of Birth: 08-12-1955  [facility] CM/SW Contact:  Cylus Douville C Natalyn Szymanowski, RN Phone Number: 10/05/2023, 1:12 PM   Clinical Narrative:    Spoke with patient's father, [place] regarding discharge plans for home hospice. He has been advised EMS will be arranged and a home hospice representative will visit around 6pm per [place] [facility] arranged for home, She's third. Face sheet and medical necessity forms printed to the floor to  be added to the EMS pack Nurse notified of details.   TOC signing off.       Barriers to Discharge: Continued Medical Work up   Patient Goals and CMS Choice            Discharge Placement                       Discharge Plan and Services Additional resources added to the After Visit Summary for                                       Social Drivers of Health (SDOH) Interventions SDOH Screenings   Food Insecurity: No Food Insecurity (09/28/2023)  Housing: Unknown (09/28/2023)  Transportation Needs: No Transportation Needs (09/28/2023)  Utilities: Patient Unable To Answer (02/06/2023)  Depression (PHQ2-9): Low Risk  (02/03/2022)  Financial Resource Strain: Low Risk  (09/28/2023)  Social Connections: Unknown (09/26/2023)  Tobacco Use: Medium Risk (09/28/2023)     Readmission Risk Interventions     No data to display

## 2023-10-05 NOTE — Progress Notes (Signed)
 Patient to be discharged home.  Patient to be transported by EMS.  IV removed with the catheter intact.  Discharge instructions and prescription information placed in the packet for discharge.

## 2023-10-05 NOTE — Plan of Care (Signed)
  Problem: Education: Goal: Ability to describe self-care measures that may prevent or decrease complications (Diabetes Survival Skills Education) will improve Outcome: Progressing   Problem: Coping: Goal: Ability to adjust to condition or change in health will improve Outcome: Progressing   Problem: Fluid Volume: Goal: Ability to maintain a balanced intake and output will improve Outcome: Progressing   Problem: Health Behavior/Discharge Planning: Goal: Ability to identify and utilize available resources and services will improve Outcome: Progressing   Problem: Health Behavior/Discharge Planning: Goal: Ability to manage health-related needs will improve Outcome: Progressing   Problem: Skin Integrity: Goal: Risk for impaired skin integrity will decrease Outcome: Progressing   Problem: Clinical Measurements: Goal: Will remain free from infection Outcome: Progressing   Problem: Clinical Measurements: Goal: Diagnostic test results will improve Outcome: Progressing   Plan of care, assessment, treatment, monitoring, and intervention (s) ongoing, see MAR see flowsheet

## 2023-10-05 NOTE — Care Management Important Message (Signed)
 Important Message  Patient Details  Name: Michele James MRN: [id number] Date of Birth: February 21, 1956   Important Message Given:  Yes - Medicare IM     Rojelio [name] 10/05/2023, 2:50 PM

## 2023-10-05 NOTE — Discharge Summary (Signed)
 Michele James FMW:[id number] DOB: 1956/01/09 DOA: 09/25/2023  PCP: Glover Lenis, MD  Admit date: 09/25/2023 Discharge date: 10/05/2023  Time spent: 35 minutes  Recommendations for Outpatient Follow-up:  Cardiology f/u ([facility] clinic one week, cardiologist 3 weeks)  General surgery (Dr. Jordis) f/u 2 weeks Pcp f/u 1 week    Discharge Diagnoses:  Principal Problem:   Acute hypoxic respiratory failure (HCC) Active Problems:   CHF exacerbation (HCC)   UTI (urinary tract infection)   Pneumonia of both lungs due to infectious organism   Diabetes mellitus without complication (HCC)   Anemia   Hypothyroidism   PVD (peripheral vascular disease) (HCC)   Seizures (HCC)   Chronic GERD   Anxiety and depression   History of cerebral palsy   Aphasia following other cerebrovascular disease   Schizophreniform disorder (HCC)   Discharge Condition: stable  Diet recommendation: regular  Filed Weights   10/03/23 0500 10/04/23 0517 10/05/23 0349  Weight: 68.8 kg 68 kg (!) 145.4 kg    History of present illness:  From admission h and p Michele James is a 68 y.o. female with medical history significant of  IDA, GERD, DMII, Depression, Glaucoma, HLD, hypothyrodism , Seizures, cerebral palsy,PVD,schizophreniform disorder who presents to ED with sob/productive cough and b/l lower extremity swelling. In the field EMS noted sat 90% on ra she was placed on 2L [place] and transported to ED. Patient states she has been ill x 1 days.  She notes no fever no chills.  She is not a reliable historian.    Hospital Course:   # Goals of care Palliative and hospice engaged, will discharge with home hospice, for now treating the treatable but father is considering transition to full comfort care. Discharge plan reviewed with father who is in agreement.   # Acute hypoxic hypercarbic respiratory failure 2/2 pna, chf. Intubated initially, then weaned to hfnc, now weaned to nasal cannula - [place] O2 started   #  Acute encephalopathy Resolved, likely 2/2 hypercarbia   # HFrEF with acute exacerbation # Demand ischemia Cardiology following. EF 35-40 with moderate RV dysfunction and pulmonary hypertension. Diuresing well, out 2.3 liters yesterday. Trops elevated, cardiology thinks demand - lasix  40 daily at discharge - f/u as above   # Sepsis  Resolved   # Aspiration pneumonia resolved. CXR improving. - s/p unasyn  for 5 day course   # Klebsiella and enterococcus uti - s/p unasyn  as above   # Fluid collection Incidental on 6/24 CT angio ordered by fascular, in the recto vesicular recess consistent with abscess, stable if slightly improved on CT 7/5. Gen surg advising continuing abx, thinks possibly 2/2 diverticulitis. IR consulted but no window for drain. - continue augmentin , per gen surg for 2 weeks - outpt gen surg f/u if consistent w/ goals of care, father is aware   # T2DM  Eugolycemic   # Cerebral palsy With contractures, bedbound at baseline   # Schizophrenia - cont home zyprexa , xanax , tegretol , paroxetine    # Hypothyroid - home levothyroxine     Procedures: none   Consultations: Pccm, cardiology  Discharge Exam: Vitals:   10/05/23 0851 10/05/23 1151  BP: 105/76 (!) 99/51  Pulse: 87 (!) 104  Resp:    Temp: 98.7 F (37.1 C) 98.4 F (36.9 C)  SpO2: 92% 92%    General exam: Appears calm and comfortable  Respiratory system: Clear to auscultation save for rales at bases Cardiovascular system: S1 & S2 heard, RRR.   Gastrointestinal system: Abdomen is nondistended,  soft and nontender.  . Central nervous system: Alert and oriented to self, place Extremities: contractures Skin: No visible rashes, lesions or ulcers Psychiatry: calm  Discharge Instructions   Discharge Instructions     Diet general   Complete by: As directed    Discharge wound care:   Complete by: As directed    Continue prior wound care   Increase activity slowly   Complete by: As directed        Allergies as of 10/05/2023       Reactions   Aminophylline Other (See Comments)   Headache   Cinnamon Other (See Comments)   Stomachache    Rice    Pt states it causes infection   Amoxicillin  Rash   Tolerated Unasyn  July 2025   Latex Rash   Nsaids Rash   Penicillins Nausea And Vomiting   Potassium Nausea And Vomiting, Nausea Only   Potassium-containing Compounds Nausea And Vomiting   Septra [sulfamethoxazole-trimethoprim] Rash   Sulfa Antibiotics Rash   Sulfasalazine Rash   Tape Rash   Paper tape ok   Theophyllines Other (See Comments)   Headache        Medication List     STOP taking these medications    naproxen 375 MG tablet Commonly known as: NAPROSYN       TAKE these medications    acetaminophen  325 MG tablet Commonly known as: TYLENOL  Take 325 mg by mouth every 6 (six) hours as needed.   alendronate  70 MG tablet Commonly known as: FOSAMAX  Take 70 mg by mouth once a week. Take with a full glass of water on an empty stomach.   ALPRAZolam  0.25 MG tablet Commonly known as: XANAX  Take 0.25 mg by mouth as needed for anxiety. Before pap smears   amoxicillin -clavulanate 875-125 MG tablet Commonly known as: AUGMENTIN  Take 1 tablet by mouth every 12 (twelve) hours for 14 days.   atorvastatin  40 MG tablet Commonly known as: LIPITOR Take 40 mg by mouth daily.   calcium  citrate-vitamin D  315-200 MG-UNIT tablet Commonly known as: CITRACAL+D Take 1 tablet by mouth 2 (two) times daily.   carbamazepine  200 MG tablet Commonly known as: TEGRETOL  Take 200 mg by mouth 3 (three) times daily.   Cholecalciferol  100 MCG (4000 UT) Tabs Take 1,000 Units by mouth.   esomeprazole 40 MG capsule Commonly known as: NEXIUM Take 40 mg by mouth daily at 12 noon.   famotidine  20 MG tablet Commonly known as: PEPCID  Take 20 mg by mouth 2 (two) times daily.   furosemide  20 MG tablet Commonly known as: Lasix  Take 2 tablets (40 mg total) by mouth daily.    hydroxypropyl methylcellulose / hypromellose 2.5 % ophthalmic solution Commonly known as: ISOPTO TEARS / GONIOVISC 1 drop as needed for dry eyes.   iron  polysaccharides 150 MG capsule Commonly known as: NIFEREX Take 1 capsule (150 mg total) by mouth 2 (two) times daily for 30 days, THEN 1 capsule (150 mg total) daily. Start taking on: April 21, 2022   levothyroxine  100 MCG tablet Commonly known as: SYNTHROID  Take 100 mcg by mouth every morning.   magnesium  oxide 400 (240 Mg) MG tablet Commonly known as: MAG-OX Take 1 tablet by mouth 3 (three) times daily.   OLANZapine  5 MG tablet Commonly known as: ZYPREXA  Take 1 tablet (5 mg total) by mouth daily. What changed: Another medication with the same name was changed. Make sure you understand how and when to take each.   OLANZapine  10 MG tablet Commonly  known as: ZYPREXA  Take 1 tablet (10 mg total) by mouth at bedtime. What changed: additional instructions   OLANZapine  15 MG tablet Commonly known as: ZYPREXA  Take 1 tablet (15 mg total) by mouth at bedtime. What changed: additional instructions   PARoxetine  20 MG tablet Commonly known as: PAXIL  TAKE 1 TABLET BY MOUTH EVERY DAY   potassium chloride  20 MEQ/15ML (10%) Soln Take 10 mEq by mouth daily.   vitamin E 180 MG (400 UNITS) capsule Take 400 Units by mouth daily.               Discharge Care Instructions  (From admission, onward)           Start     Ordered   10/05/23 0000  Discharge wound care:       Comments: Continue prior wound care   10/05/23 1218           Allergies  Allergen Reactions   Aminophylline Other (See Comments)    Headache   Cinnamon Other (See Comments)    Stomachache    Rice     Pt states it causes infection    Amoxicillin  Rash    Tolerated Unasyn  July 2025   Latex Rash   Nsaids Rash   Penicillins Nausea And Vomiting   Potassium Nausea And Vomiting and Nausea Only   Potassium-Containing Compounds Nausea And Vomiting    Septra [Sulfamethoxazole-Trimethoprim] Rash   Sulfa Antibiotics Rash   Sulfasalazine Rash   Tape Rash    Paper tape ok   Theophyllines Other (See Comments)    Headache     Follow-up Information     Hudson, Caralyn, PA-C. Go in 1 week(s).   Specialty: Cardiology Why: Appointment scheduled with [facility] on 10/07/23 at 10 AM Contact information: [address] [place] [address] [phone]         Wilburn Keller BROCKS, MD. Go in 3 week(s).   Specialty: Cardiology Contact information: [address] [place] [address] [phone]         Jordis Laneta FALCON, MD Follow up.   Specialty: General Surgery Why: call to schedule an appointment in about 2 weeks Contact information: [address] [place] [address] [phone]         Glover Lenis, MD Follow up.   Specialty: Family Medicine Contact information: [address] [place] [address] [phone]                  The results of significant diagnostics from this hospitalization (including imaging, microbiology, ancillary and laboratory) are listed below for reference.    Significant Diagnostic Studies: EEG adult Result Date: 10/05/2023 Shelton Arlin KIDD, MD     10/05/2023  9:42 AM Patient Name: Michele James MRN: [id number] Epilepsy Attending: Arlin KIDD Shelton Referring Physician/Provider: Kandis Devaughn Sayres, MD Date: 10/01/2023 Duration: 28.31 mins Patient history: 68yo F with ams. EEG to evaluate for seizure Level of alertness: Awake AEDs during EEG study: None Technical aspects: This EEG study was done with scalp electrodes positioned according to the 10-20 International system of electrode placement. Electrical activity was reviewed with band pass filter of 1-70Hz , sensitivity of 7 uV/mm, display speed of 46mm/sec with a 60Hz  notched filter applied as appropriate. EEG data were recorded continuously and digitally stored.  Video  monitoring was available and reviewed as appropriate. Description: EEG showed primary generalized predominantly 6 to 9 Hz theta-alpha activity admixed with 13 to 15 Hz beta activity.  Hyperventilation and photic stimulation were not performed.   ABNORMALITY - Continuous slow, generalized IMPRESSION: This study is suggestive of mild diffuse encephalopathy.  No seizures or epileptiform discharges were seen throughout the recording. Arlin MALVA Krebs   CT ABDOMEN PELVIS W CONTRAST Result Date: 10/02/2023 CLINICAL DATA:  Fluid collection in pelvis seen on prior CT EXAM: CT ABDOMEN AND PELVIS WITH CONTRAST TECHNIQUE: Multidetector CT imaging of the abdomen and pelvis was performed using the standard protocol following bolus administration of intravenous contrast. RADIATION DOSE REDUCTION: This exam was performed according to the departmental dose-optimization program which includes automated exposure control, adjustment of the mA and/or kV according to patient size and/or use of iterative reconstruction technique. CONTRAST:  OMNIPAQUE  IOHEXOL  300 MG/ML  SOLN COMPARISON:  09/22/2023 FINDINGS: Lower chest: Right lower lobe pulmonary nodule measures 9 mm. Previously seen right pleural effusion has resolved. No current pleural effusions. Moderate-sized hiatal hernia with compressive atelectasis in the adjacent left lower lobe. Dependent atelectasis in the right lower lobe. Hepatobiliary: No focal liver abnormality is seen. Status post cholecystectomy. No biliary dilatation. Pancreas: No focal abnormality or ductal dilatation. Spleen: No focal abnormality.  Normal size. Adrenals/Urinary Tract: Foley catheter present in the bladder. No renal or adrenal mass. No hydronephrosis. Stomach/Bowel: Large stool burden throughout the colon. Decreasing stool burden in the rectum since prior study. Stomach and small bowel decompressed. No bowel obstruction or inflammatory process. Vascular/Lymphatic: No evidence of aneurysm or  adenopathy. Reproductive: Prior hysterectomy Other: Previously seen air and fluid collection in the pelvis has decreased in size. Air is no longer visualized. Soft tissue density area in the pelvis now measures 6.4 x 3.9 cm. Musculoskeletal: No acute bony abnormality. IMPRESSION: Previously seen air and fluid collection in the pelvis has slightly decreased in size and air is no longer visualized. Large stool burden throughout the colon. Resolved right pleural effusion. Moderate hiatal hernia. 9 mm right lower lobe pulmonary nodule. Recommend attention on follow-up imaging. Electronically Signed   By: Franky Crease M.D.   On: 10/02/2023 23:21   DG Chest Port 1 View Result Date: 10/01/2023 CLINICAL DATA:  Hypoxia EXAM: PORTABLE CHEST 1 VIEW COMPARISON:  X-ray 09/30/2023 FINDINGS: Hyperinflation. Slightly elevated right hemidiaphragm. Decreasing basilar atelectasis. Tiny pleural effusion. Kyphotic x-ray. Limited evaluation of the apices. No consolidation or edema. Previous left IJ catheter no longer identified. No definite pneumothorax. Known hiatal hernia. IMPRESSION: Decreasing basilar atelectasis. Removal of left IJ catheter. No obvious pneumothorax. Electronically Signed   By: Ranell Bring M.D.   On: 10/01/2023 11:12   DG Chest Port 1 View Result Date: 09/30/2023 CLINICAL DATA:  Shortness of breath. EXAM: PORTABLE CHEST 1 VIEW COMPARISON:  09/27/2023. FINDINGS: Stable left IJ CVC catheter tip at the superior cavoatrial junction. The heart size and mediastinal contours are within normal limits. Small bilateral layering pleural effusions with decreased patchy bibasilar opacities and improved aeration at the lung bases. No pneumothorax. The visualized skeletal structures are unchanged. IMPRESSION: Small bilateral pleural effusions with decreased patchy bibasilar opacities and improved aeration at the lung bases. Electronically Signed   By: Harrietta Sherry M.D.   On: 09/30/2023 08:36   ECHOCARDIOGRAM  COMPLETE Result Date: 09/27/2023    ECHOCARDIOGRAM REPORT   Patient Name:   Michele James Date of Exam: 09/27/2023 Medical Rec #:  [id number]      Height:       63.0 in Accession #:    [id number]     Weight:       181.2  lb Date of Birth:  September 26, 1955      BSA:          1.854 m Patient Age:    68 years       BP:           104/56 mmHg Patient Gender: F              HR:           62 bpm. Exam Location:  [facility] Procedure: 2D Echo, Color Doppler, Cardiac Doppler and Strain Analysis (Both            Spectral and Color Flow Doppler were utilized during procedure). Indications:     I50.31 Acute Diastolic CHF  History:         Patient has prior history of Echocardiogram examinations. Risk                  Factors:Hypertension and Dyslipidemia.  Sonographer:     L. Thornton-Maynard Referring Phys:  [id number] CAMILA A THOMAS Diagnosing Phys: Redell Cave MD  Sonographer Comments: Echo performed with patient supine and on artificial respirator. Global longitudinal strain was attempted. IMPRESSIONS  1. Left ventricular ejection fraction, by estimation, is 35 to 40%. The left ventricle has moderately decreased function. The left ventricle demonstrates global hypokinesis. Left ventricular diastolic parameters are consistent with Grade I diastolic dysfunction (impaired relaxation). There is the interventricular septum is flattened in systole and diastole, consistent with right ventricular pressure and volume overload.  2. Right ventricular systolic function is moderately reduced. The right ventricular size is mildly enlarged. There is severely elevated pulmonary artery systolic pressure. The estimated right ventricular systolic pressure is 61.1 mmHg.  3. Right atrial size was severely dilated.  4. The mitral valve is normal in structure. No evidence of mitral valve regurgitation.  5. The aortic valve is tricuspid. Aortic valve regurgitation is mild. Aortic valve sclerosis is present, with no evidence of aortic valve stenosis.  FINDINGS  Left Ventricle: Left ventricular ejection fraction, by estimation, is 35 to 40%. The left ventricle has moderately decreased function. The left ventricle demonstrates global hypokinesis. The left ventricular internal cavity size was normal in size. There is no left ventricular hypertrophy. The interventricular septum is flattened in systole and diastole, consistent with right ventricular pressure and volume overload. Left ventricular diastolic parameters are consistent with Grade I diastolic dysfunction (impaired relaxation). Right Ventricle: The right ventricular size is mildly enlarged. No increase in right ventricular wall thickness. Right ventricular systolic function is moderately reduced. There is severely elevated pulmonary artery systolic pressure. The tricuspid regurgitant velocity is 3.81 m/s, and with an assumed right atrial pressure of 3 mmHg, the estimated right ventricular systolic pressure is 61.1 mmHg. Left Atrium: Left atrial size was normal in size. Right Atrium: Right atrial size was severely dilated. Pericardium: There is no evidence of pericardial effusion. Mitral Valve: The mitral valve is normal in structure. No evidence of mitral valve regurgitation. MV peak gradient, 2.9 mmHg. The mean mitral valve gradient is 1.0 mmHg. Tricuspid Valve: The tricuspid valve is normal in structure. Tricuspid valve regurgitation is mild. Aortic Valve: The aortic valve is tricuspid. Aortic valve regurgitation is mild. Aortic valve sclerosis is present, with no evidence of aortic valve stenosis. Aortic valve mean gradient measures 2.0 mmHg. Aortic valve peak gradient measures 3.3 mmHg. Aortic valve area, by VTI measures 2.43 cm. Pulmonic Valve: The pulmonic valve was grossly normal. Pulmonic valve regurgitation is not visualized. Aorta: The aortic root and ascending aorta  are structurally normal, with no evidence of dilitation. Venous: IVC assessment for right atrial pressure unable to be performed due  to mechanical ventilation. IAS/Shunts: No atrial level shunt detected by color flow Doppler.  LEFT VENTRICLE PLAX 2D LVIDd:         3.70 cm     Diastology LVIDs:         2.90 cm     LV e' medial:    5.70 cm/s LV PW:         0.90 cm     LV E/e' medial:  7.6 LV IVS:        0.90 cm     LV e' lateral:   6.83 cm/s LVOT diam:     1.90 cm     LV E/e' lateral: 6.4 LV SV:         45 LV SV Index:   24 LVOT Area:     2.84 cm  LV Volumes (MOD) LV vol d, MOD A2C: 33.5 ml LV vol d, MOD A4C: 35.8 ml LV vol s, MOD A2C: 17.4 ml LV vol s, MOD A4C: 11.8 ml LV SV MOD A2C:     16.1 ml LV SV MOD A4C:     35.8 ml LV SV MOD BP:      20.5 ml RIGHT VENTRICLE            IVC RV Basal diam:  4.40 cm    IVC diam: 2.10 cm RV Mid diam:    2.90 cm RV S prime:     8.15 cm/s TAPSE (M-mode): 0.7 cm LEFT ATRIUM             Index        RIGHT ATRIUM           Index LA diam:        3.30 cm 1.78 cm/m   RA Area:     24.80 cm LA Vol (A2C):   32.5 ml 17.53 ml/m  RA Volume:   97.20 ml  52.42 ml/m LA Vol (A4C):   26.3 ml 14.18 ml/m LA Biplane Vol: 30.2 ml 16.29 ml/m  AORTIC VALVE                    PULMONIC VALVE AV Area (Vmax):    2.58 cm     PV Vmax:       0.62 m/s AV Area (Vmean):   2.21 cm     PV Peak grad:  1.6 mmHg AV Area (VTI):     2.43 cm AV Vmax:           90.85 cm/s AV Vmean:          63.650 cm/s AV VTI:            0.184 m AV Peak Grad:      3.3 mmHg AV Mean Grad:      2.0 mmHg LVOT Vmax:         82.70 cm/s LVOT Vmean:        49.600 cm/s LVOT VTI:          0.158 m LVOT/AV VTI ratio: 0.86  AORTA Ao Root diam: 3.30 cm Ao Asc diam:  3.20 cm MITRAL VALVE               TRICUSPID VALVE MV Area (PHT): 2.87 cm    TR Peak grad:   58.1 mmHg MV Area VTI:   2.02 cm    TR Vmax:  381.00 cm/s MV Peak grad:  2.9 mmHg MV Mean grad:  1.0 mmHg    SHUNTS MV Vmax:       0.85 m/s    Systemic VTI:  0.16 m MV Vmean:      42.8 cm/s   Systemic Diam: 1.90 cm MV Decel Time: 264 msec MV E velocity: 43.50 cm/s MV A velocity: 75.50 cm/s MV E/A ratio:  0.58 Redell Cave MD Electronically signed by Redell Cave MD Signature Date/Time: 09/27/2023/7:23:05 PM    Final    DG Abd 1 View Result Date: 09/27/2023 CLINICAL DATA:  [id number] Encounter for imaging study to confirm orogastric (OG) tube placement [id number] EXAM: ABDOMEN - 1 VIEW COMPARISON:  Earlier film of the same day, CT 09/22/2023 FINDINGS: Gastric tube overlies the medial left infrahilar chest and left upper abdomen corresponding to hiatal hernia seen on prior CT. The stomach is nondistended. visualized small bowel and colon normal. Lumbar levoscoliosis with multilevel spondylitic change. IMPRESSION: Gastric tube extends to the level of hiatal hernia. Electronically Signed   By: JONETTA Faes M.D.   On: 09/27/2023 10:30   DG Abd 1 View Result Date: 09/27/2023 CLINICAL DATA:  [id number]. Encounter for study to confirm nasogastric tube placement. EXAM: ABDOMEN - 1 VIEW COMPARISON:  PET-CT 07/02/2020, abdomen x-rays 10/21/2022 FINDINGS: There is A moderate sized hiatal hernia. NGT passes into the stomach below the diaphragm and is probably in the distal body of stomach. The bowel pattern is nonobstructive with moderate retained stool. The pelvis was excluded from the exam. There is no semi supine evidence of free air. No other significant radiographic findings. Advanced degenerative change lumbar spine. IMPRESSION: 1. NGT passes into the stomach below the diaphragm and is probably in the distal body of stomach. 2. Moderate sized hiatal hernia. 3. Nonobstructive bowel pattern with moderate retained stool. Electronically Signed   By: Francis Quam M.D.   On: 09/27/2023 05:11   Portable Chest x-ray Result Date: 09/27/2023 CLINICAL DATA:  Central line placement EXAM: PORTABLE CHEST 1 VIEW COMPARISON:  Same day chest radiograph FINDINGS: Endotracheal tube tip in the mid intrathoracic trachea. Left IJ CVC tip in the right atrium. No pneumothorax. Similar patchy bilateral airspace opacities and layering pleural effusions.  Stable cardiomediastinal silhouette. Aortic atherosclerotic calcification. IMPRESSION: 1. Left IJ CVC tip in the right atrium. No pneumothorax. 2. Similar patchy bilateral airspace opacities and layering pleural effusions. Electronically Signed   By: Norman Gatlin M.D.   On: 09/27/2023 02:10   DG Chest Port 1 View Result Date: 09/27/2023 CLINICAL DATA:  Endotracheal tube placement EXAM: PORTABLE CHEST 1 VIEW COMPARISON:  09/25/2023 FINDINGS: Endotracheal tube tip in the mid intrathoracic trachea. Low lung volumes. Stable cardiomediastinal silhouette. Aortic atherosclerotic calcification. Diffuse bronchial wall thickening compatible with bronchitis. Small bilateral pleural effusions and basilar airspace opacities. No pneumothorax. No displaced rib fracture. Remote left rib fractures. IMPRESSION: 1. Endotracheal tube tip in the mid intrathoracic trachea. 2. Small bilateral pleural effusions and basilar airspace opacities, atelectasis versus pneumonia. 3. Bronchitis. Electronically Signed   By: Norman Gatlin M.D.   On: 09/27/2023 00:51   CT Angio Chest PE W/Cm &/Or Wo Cm Result Date: 09/25/2023 CLINICAL DATA:  Pulmonary embolism (PE) suspected, high prob, dyspnea EXAM: CT ANGIOGRAPHY CHEST WITH CONTRAST TECHNIQUE: Multidetector CT imaging of the chest was performed using the standard protocol during bolus administration of intravenous contrast. Multiplanar CT image reconstructions and MIPs were obtained to evaluate the vascular anatomy. RADIATION DOSE REDUCTION: This exam was performed according to  the departmental dose-optimization program which includes automated exposure control, adjustment of the mA and/or kV according to patient size and/or use of iterative reconstruction technique. CONTRAST:  75mL OMNIPAQUE  IOHEXOL  350 MG/ML SOLN COMPARISON:  Chest radiograph from earlier today. 10/18/2022 chest CT angiogram. FINDINGS: Cardiovascular: Streaky indistinct areas of low-attenuation throughout the main,  right and left pulmonary arteries, favor mixing artifact. No convincing filling defects in the pulmonary arteries to suggest pulmonary embolism. Atherosclerotic nonaneurysmal thoracic aorta. Normal caliber pulmonary arteries. Top-normal heart size. No significant pericardial fluid/thickening. Mediastinum/Nodes: No significant thyroid  nodules. Unremarkable esophagus. No pathologically enlarged axillary, mediastinal or hilar lymph nodes. Lungs/Pleura: No pneumothorax. Small to moderate right and small left layering pleural effusions. Moderate dependent bibasilar atelectasis. Mosaic attenuation throughout both lungs. Indistinct tiny 0.3 cm posterior right upper lobe solid pulmonary nodule on series 5/image 69, stable. No new significant pulmonary nodules in the aerated portions of the lungs. No acute consolidative airspace disease. Upper abdomen: No acute abnormality. Musculoskeletal: No aggressive appearing focal osseous lesions. Marked thoracic spondylosis. Review of the MIP images confirms the above findings. IMPRESSION: 1. No convincing evidence of pulmonary embolism. Streaky indistinct areas of low-attenuation throughout the main, right and left pulmonary arteries, favor mixing artifact. 2. Small to moderate right and small left layering pleural effusions. Moderate dependent bibasilar atelectasis. 3. Mosaic attenuation throughout both lungs, as can be seen with air trapping from small airways disease versus mosaic perfusion from pulmonary vascular disease. 4.  Aortic Atherosclerosis (ICD10-I70.0). Electronically Signed   By: Selinda DELENA Blue M.D.   On: 09/25/2023 10:54   DG Chest 1 View Result Date: 09/25/2023 CLINICAL DATA:  Shortness of breath and edema. EXAM: CHEST  1 VIEW COMPARISON:  AP Lat chest 02/06/2023 FINDINGS: Low inspiration on exam compared to the prior study. There are small pleural effusions. There is patchy consolidation or atelectasis in both lower lung fields. Superimposed chronic elevation right  hemidiaphragm. There is a large hiatal hernia with stable mediastinum. Mild cardiomegaly without vascular prominence. There is aortic atherosclerosis. Degenerative change thoracic spine. No new osseous findings. IMPRESSION: 1. Low inspiration. 2. Small pleural effusions. 3. Patchy consolidation or atelectasis in both lower lung fields. 4. Large hiatal hernia. 5. Aortic atherosclerosis. Electronically Signed   By: Francis Quam M.D.   On: 09/25/2023 07:53   CT ANGIO AO+BIFEM W & OR WO CONTRAST Result Date: 09/23/2023 CLINICAL DATA:  Lower extremity claudication with soft tissue ulceration EXAM: CT ANGIOGRAPHY OF ABDOMINAL AORTA WITH ILIOFEMORAL RUNOFF TECHNIQUE: Multidetector CT imaging of the abdomen, pelvis and lower extremities was performed using the standard protocol during bolus administration of intravenous contrast. Multiplanar CT image reconstructions and MIPs were obtained to evaluate the vascular anatomy. RADIATION DOSE REDUCTION: This exam was performed according to the departmental dose-optimization program which includes automated exposure control, adjustment of the mA and/or kV according to patient size and/or use of iterative reconstruction technique. CONTRAST:  OMNIPAQUE  IOHEXOL  350 MG/ML SOLN COMPARISON:  Prior PET-CT 07/02/2020 FINDINGS: VASCULAR Aorta: Normal caliber aorta without aneurysm, dissection, vasculitis or significant stenosis. Celiac: Patent without evidence of aneurysm, dissection, vasculitis or significant stenosis. SMA: Patent without evidence of aneurysm, dissection, vasculitis or significant stenosis. Renals: Both main renal arteries are patent without evidence of aneurysm, dissection, vasculitis, fibromuscular dysplasia or significant stenosis. Small accessory artery to the right kidney. IMA: Patent without evidence of aneurysm, dissection, vasculitis or significant stenosis. RIGHT Lower Extremity Inflow: Common, internal and external iliac arteries are patent without  evidence of aneurysm, dissection, vasculitis or significant stenosis.  Outflow: Common, superficial and profunda femoral arteries and the popliteal artery are patent without evidence of aneurysm, dissection, vasculitis or significant stenosis. Runoff: Patent three vessel runoff to the ankle. LEFT Lower Extremity Inflow: Common, internal and external iliac arteries are patent without evidence of aneurysm, dissection, vasculitis or significant stenosis. Outflow: Common, superficial and profunda femoral arteries and the popliteal artery are patent without evidence of aneurysm, dissection, vasculitis or significant stenosis. Runoff: Patent three vessel runoff to the ankle. Veins: No focal venous abnormality. Review of the MIP images confirms the above findings. NON-VASCULAR Lower chest: Marked enlargement of the main pulmonary artery at 3.2 cm suggesting pulmonary arterial hypertension. Cardiomegaly with right heart enlargement. No pericardial effusion. Moderate right and trace left pleural effusions. Large sliding hiatal hernia. Hepatobiliary: No focal liver abnormality is seen. Status post cholecystectomy. No biliary dilatation. Pancreas: Unremarkable. No pancreatic ductal dilatation or surrounding inflammatory changes. Spleen: Normal in size without focal abnormality. Adrenals/Urinary Tract: Adrenal glands are unremarkable. Kidneys are normal, without renal calculi, focal lesion, or hydronephrosis. Bladder is unremarkable. Stomach/Bowel: Large volume of formed stool in the rectum with a rectal stool ball measuring at least 9 x 7 x 6 cm. There is mild rectal wall thickening. No evidence of bowel obstruction. Normal appendix. Abnormal thickening of a distal loop of ileum in the low anatomic pelvis immediately adjacent to the large abscess collection. Lymphatic: No suspicious lymphadenopathy. Reproductive: Apparent postsurgical changes of hysterectomy. The uterus is no longer visualized although it was present on prior  PET imaging from April of 2022. Other: Thick walled fluid and gas collection in the recto vesicular recess measures approximately 6.8 x 4.4 x 4.4 cm. Diffuse diastasis recti. Small fat containing ventral abdominal hernia. 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 Inward rotation of the left lower extremity with severe degenerative arthritis. Marked reticulation of the subcutaneous fat affecting the body wall and bilateral lower extremities consistent with edema. IMPRESSION: VASCULAR 1. No evidence of hemodynamically significant arterial stenosis or occlusion. No evidence of significant peripheral arterial disease. 2. Trace aortic atherosclerotic vascular calcifications. Aortic Atherosclerosis (ICD10-I70.0). NON-VASCULAR 1. Thick walled fluid and gas collection in the recto vesicular recess within the pelvic cul-de-sac consistent with an abscess measuring 6.8 x 4.4 x 4.4 cm. There is an associated adjacent thick walled loop of small bowel which may represent chronic secondary inflammatory reaction if the source of the abscess is related to a prior hysterectomy, a source of chronic small bowel perforation, or potentially even lymphomatous involvement of the small bowel with contained perforation. 2. Extensive anasarca and bilateral lower extremity edema. Findings are favored to be related to elevated right heart pressures in the setting of pulmonary arterial hypertension. 3. Moderate right and trace left pleural effusions. 4. Moderate to large sliding hiatal hernia. 5. 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 6. Inward rotation of the left lower extremity with advanced knee joint degenerative osteoarthritis. These results will be called to the ordering clinician or representative by the Radiologist Assistant, and communication documented in the PACS or [employer]. Electronically Signed   By: Wilkie Lent M.D.   On: 09/23/2023 09:04    VAS US  LOWER EXTREMITY ARTERIAL DUPLEX Result Date: 09/07/2023 LOWER EXTREMITY ARTERIAL DUPLEX STUDY Patient Name:  Michele James  Date of Exam:   09/07/2023 Medical Rec #: [id number]       Accession #:    [id number] Date of Birth: June 26, 1955       Patient Gender: F Patient  Age:   72 years Exam Location:  [facility] Procedure:      VAS US  LOWER EXTREMITY ARTERIAL DUPLEX Referring Phys: [facility] --------------------------------------------------------------------------------  Indications: Ulceration.  Current ABI: not done Limitations: Patient is unable to stand; exam was performed with patient upright              in a wheelchair Comparison Study: ABI on 06/29/23: Right=1.27(TBI=0.97) & Left=1.28(TBI=0.93) Performing Technologist: [name] Churn RT, RDMS, RVT  Examination Guidelines: A complete evaluation includes B-mode imaging, spectral Doppler, color Doppler, and power Doppler as needed of all accessible portions of each vessel. Bilateral testing is considered an integral part of a complete examination. Limited examinations for reoccurring indications may be performed as noted.  +-----------+--------+-----+--------+---------+-------------+ RIGHT      PSV cm/sRatioStenosisWaveform Comments      +-----------+--------+-----+--------+---------+-------------+ CFA Mid                                  not evaluated +-----------+--------+-----+--------+---------+-------------+ SFA Prox                                 not evaluated +-----------+--------+-----+--------+---------+-------------+ SFA Mid    77                   biphasic               +-----------+--------+-----+--------+---------+-------------+ SFA Distal 73                   biphasic               +-----------+--------+-----+--------+---------+-------------+ POP Prox   70                   biphasic               +-----------+--------+-----+--------+---------+-------------+ POP Distal 64                    triphasic              +-----------+--------+-----+--------+---------+-------------+ ATA Distal 36                   biphasic               +-----------+--------+-----+--------+---------+-------------+ PTA Distal 62                   triphasic              +-----------+--------+-----+--------+---------+-------------+ PERO Distal30                   biphasic               +-----------+--------+-----+--------+---------+-------------+ Limited evaluation of the right lower extremity due to limited patient mobility.  +--------------+-------+-----------+--------+--------+-----+--------+ Left PoplitealAP (cm)Transv (cm)WaveformStenosisShapeComments +--------------+-------+-----------+--------+--------+-----+--------+ Distal                                                        +--------------+-------+-----------+--------+--------+-----+--------+  Summary: Right: Patent SFA, popliteal and tibial arteries without evidence of significant stenosis. No evidence of significant arterial occlusive disease in the proximal vessels, based on Doppler waveforms and previous ABI.  See table(s) above for measurements and observations. Electronically signed by Cordella  Schnier MD on 09/07/2023 at 5:25:09 PM.   Final     Microbiology: Recent Results (from the past 240 hours)  Culture, blood (routine x 2) Call MD if unable to obtain prior to antibiotics being given     Status: None   Collection Time: 09/25/23  3:26 PM   Specimen: BLOOD  Result Value Ref Range Status   Specimen Description BLOOD BLOOD RIGHT HAND  Final   Special Requests   Final    BOTTLES DRAWN AEROBIC ONLY Blood Culture results may not be optimal due to an inadequate volume of blood received in culture bottles   Culture   Final    NO GROWTH 5 DAYS Performed at [facility], [address] [place], [place] [address]    Report Status 09/30/2023 FINAL  Final  Culture, blood (routine x 2) Call MD if  unable to obtain prior to antibiotics being given     Status: None   Collection Time: 09/26/23  3:36 AM   Specimen: BLOOD  Result Value Ref Range Status   Specimen Description BLOOD RIGHT ARM  Final   Special Requests   Final    BOTTLES DRAWN AEROBIC AND ANAEROBIC Blood Culture adequate volume   Culture   Final    NO GROWTH 5 DAYS Performed at [facility], [address], [place], [place] [address]    Report Status 10/01/2023 FINAL  Final  MRSA Next Gen by PCR, Nasal     Status: None   Collection Time: 09/27/23  2:06 AM   Specimen: Nasal Mucosa; Nasal Swab  Result Value Ref Range Status   MRSA by PCR Next Gen NOT DETECTED NOT DETECTED Final    Comment: (NOTE) The GeneXpert MRSA Assay (FDA approved for NASAL specimens only), is one component of a comprehensive MRSA colonization surveillance program. It is not intended to diagnose MRSA infection nor to guide or monitor treatment for MRSA infections. Test performance is not FDA approved in patients less than 23 years old. Performed at [facility], [address] [place], [place] [address]   Respiratory (~20 pathogens) panel by PCR     Status: None   Collection Time: 09/27/23  4:12 AM   Specimen: Nasopharyngeal Swab; Respiratory  Result Value Ref Range Status   Adenovirus NOT DETECTED NOT DETECTED Final   Coronavirus 229E NOT DETECTED NOT DETECTED Final    Comment: (NOTE) The Coronavirus on the Respiratory Panel, DOES NOT test for the novel  Coronavirus (2019 nCoV)    Coronavirus HKU1 NOT DETECTED NOT DETECTED Final   Coronavirus NL63 NOT DETECTED NOT DETECTED Final   Coronavirus OC43 NOT DETECTED NOT DETECTED Final   Metapneumovirus NOT DETECTED NOT DETECTED Final   Rhinovirus / Enterovirus NOT DETECTED NOT DETECTED Final   Influenza A NOT DETECTED NOT DETECTED Final   Influenza B NOT DETECTED NOT DETECTED Final   Parainfluenza Virus 1 NOT DETECTED NOT DETECTED Final   Parainfluenza Virus 2 NOT DETECTED  NOT DETECTED Final   Parainfluenza Virus 3 NOT DETECTED NOT DETECTED Final   Parainfluenza Virus 4 NOT DETECTED NOT DETECTED Final   Respiratory Syncytial Virus NOT DETECTED NOT DETECTED Final   Bordetella pertussis NOT DETECTED NOT DETECTED Final   Bordetella Parapertussis NOT DETECTED NOT DETECTED Final   Chlamydophila pneumoniae NOT DETECTED NOT DETECTED Final   Mycoplasma pneumoniae NOT DETECTED NOT DETECTED Final    Comment: Performed at [facility], [address], [place], [place] [address]  Culture, Respiratory w  Gram Stain     Status: None   Collection Time: 09/28/23  3:04 PM   Specimen: Tracheal Aspirate; Respiratory  Result Value Ref Range Status   Specimen Description   Final    TRACHEAL ASPIRATE Performed at [facility], [address] [place], [place] [address]    Special Requests   Final    NONE Performed at [facility], [address] [place], [place] [address]    Gram Stain   Final    RARE WBC PRESENT, PREDOMINANTLY PMN NO ORGANISMS SEEN    Culture   Final    NO GROWTH 2 DAYS Performed at [facility], [address] [place], [place] [address]    Report Status 10/01/2023 FINAL  Final     Labs: Basic Metabolic Panel: Recent Labs  Lab 09/29/23 0447 09/29/23 1337 09/30/23 0429 09/30/23 2005 10/01/23 0550 10/02/23 0340 10/03/23 0524 10/05/23 0450  NA 139   < > 137  --  141 138 137 136  K 3.8   < > 3.8 3.6 3.6 3.8 3.6 3.5  CL 99   < > 100  --  99 93* 88* 86*  CO2 30   < > 32  --  32 36* 36* 43*  GLUCOSE 108*   < > 89  --  120* 126* 122* 106*  BUN 11   < > 11  --  15 16 16 23   CREATININE 0.39*   < > 0.33*  --  0.47 0.41* 0.44 0.50  CALCIUM  7.8*   < > 7.9*  --  8.5* 8.7* 8.5* 8.8*  MG 2.1  --  1.8 1.8 2.0 1.9 2.1  --   PHOS 2.8  --  2.8  --  3.0 4.0 3.1  --    < > = values in this interval not displayed.   Liver Function Tests: No results for input(s): AST, ALT, ALKPHOS, BILITOT, PROT, ALBUMIN in the  last 168 hours. No results for input(s): LIPASE, AMYLASE in the last 168 hours. No results for input(s): AMMONIA in the last 168 hours. CBC: Recent Labs  Lab 09/29/23 0447 09/30/23 0429 10/01/23 0550 10/02/23 0340  WBC 7.2 6.5 8.1 9.2  HGB 11.3* 10.6* 11.5* 11.8*  HCT 34.7* 33.6* 36.8 39.1  MCV 97.5 99.7 100.8* 102.6*  PLT 251 217 236 239   Cardiac Enzymes: No results for input(s): CKTOTAL, CKMB, CKMBINDEX, TROPONINI in the last 168 hours. BNP: BNP (last 3 results) Recent Labs    10/21/22 0317 09/25/23 0823 09/27/23 0213  BNP 439.9* 1,406.2* 962.0*    ProBNP (last 3 results) No results for input(s): PROBNP in the last 8760 hours.  CBG: Recent Labs  Lab 10/04/23 1241 10/04/23 1602 10/04/23 2130 10/05/23 0854 10/05/23 1153  GLUCAP 126* 135* 202* 107* 171*       Signed:  Devaughn KATHEE Ban MD.  [facility] 10/05/2023, 12:29 PM

## 2023-10-05 NOTE — Procedures (Signed)
 Patient Name: DORIAN RENFRO  MRN: [id number]  Epilepsy Attending: Arlin MALVA Krebs  Referring Physician/Provider: Kandis Devaughn Sayres, MD  Date: 10/01/2023 Duration: 28.31 mins  Patient history: 68yo F with ams. EEG to evaluate for seizure  Level of alertness: Awake  AEDs during EEG study: None  Technical aspects: This EEG study was done with scalp electrodes positioned according to the 10-20 International system of electrode placement. Electrical activity was reviewed with band pass filter of 1-70Hz , sensitivity of 7 uV/mm, display speed of 73mm/sec with a 60Hz  notched filter applied as appropriate. EEG data were recorded continuously and digitally stored.  Video monitoring was available and reviewed as appropriate.  Description: EEG showed primary generalized predominantly 6 to 9 Hz theta-alpha activity admixed with 13 to 15 Hz beta activity.  Hyperventilation and photic stimulation were not performed.     ABNORMALITY - Continuous slow, generalized  IMPRESSION: This study is suggestive of mild diffuse encephalopathy.  No seizures or epileptiform discharges were seen throughout the recording.  Leeloo Silverthorne O Cale Bethard

## 2023-10-05 NOTE — Plan of Care (Signed)
  Problem: Fluid Volume: Goal: Ability to maintain a balanced intake and output will improve Outcome: Progressing   Problem: Nutritional: Goal: Maintenance of adequate nutrition will improve Outcome: Progressing   Problem: Inadequate Intake (NI-2.1) Goal: Food and/or nutrient delivery Description: Individualized approach for food/nutrient provision. Outcome: Adequate for Discharge   Problem: Acute Rehab OT Goals (only OT should resolve) Goal: Pt. Will Perform Eating Outcome: Adequate for Discharge Goal: Pt. Will Perform Grooming Outcome: Adequate for Discharge Goal: Pt. Will Transfer To Toilet Outcome: Adequate for Discharge   Problem: Acute Rehab PT Goals(only PT should resolve) Goal: Pt Will Go Supine/Side To Sit Outcome: Adequate for Discharge Goal: Patient Will Transfer Sit To/From Stand Outcome: Adequate for Discharge Goal: Pt Will Transfer Bed To Chair/Chair To Bed Outcome: Adequate for Discharge

## 2023-10-05 NOTE — Plan of Care (Signed)
   Problem: Fluid Volume: Goal: Ability to maintain a balanced intake and output will improve Outcome: Progressing   Problem: Nutritional: Goal: Maintenance of adequate nutrition will improve Outcome: Progressing

## 2023-10-15 ENCOUNTER — Ambulatory Visit (INDEPENDENT_AMBULATORY_CARE_PROVIDER_SITE_OTHER): Admitting: Vascular Surgery

## 2023-10-15 ENCOUNTER — Encounter: Payer: Self-pay | Admitting: Oncology

## 2023-10-16 ENCOUNTER — Telehealth: Payer: Self-pay | Admitting: Psychiatry

## 2023-10-16 NOTE — Telephone Encounter (Signed)
 If she's not feeling well physically or has any concerns about her health, please encourage her to reach out to her primary care provider.   In terms of her mental health care, I'm not able to offer phone visits. That said, it sounds like she may now be connected with a palliative care team. These teams often include professionals who can help with emotional and mental health support, so it may be helpful for her to speak with them. They're usually well-equipped to provide care in this area.

## 2023-10-16 NOTE — Telephone Encounter (Signed)
 Patient mother states Sanayah is on not doing well. She is on oxygen full time and has difficulty getting out of the house. Originally wanted to cancel the appointment due to the difficulty for parent to get her here. Can the appointment be done over the phone?

## 2023-10-19 ENCOUNTER — Other Ambulatory Visit: Payer: Self-pay | Admitting: Psychiatry

## 2023-10-20 ENCOUNTER — Other Ambulatory Visit: Payer: Self-pay | Admitting: Psychiatry

## 2023-10-22 ENCOUNTER — Ambulatory Visit: Admitting: Psychiatry

## 2023-11-22 ENCOUNTER — Other Ambulatory Visit (HOSPITAL_COMMUNITY): Payer: Self-pay | Admitting: Psychiatry

## 2023-12-14 ENCOUNTER — Telehealth: Payer: Self-pay | Admitting: Psychiatry

## 2023-12-14 NOTE — Telephone Encounter (Signed)
 Patient's father stopped by the office to inquire about her next appointment. Je still is taking care of medication for her. Reviewed what was discussed in July of 2025. He states Hospice comes in 1x a week but is not giving her the mental health medication. Stating she will be running out. Advised that she will need to be seen for refills but he is not able to bring her due to her condition. Advised him to speak to Hospice about the medication if they will give to her since they wanted not to give her any medication. Tearfully, he wanted me to thank you for all you have done for Russell and will talk to Hospice care.

## 2023-12-28 ENCOUNTER — Other Ambulatory Visit: Payer: Self-pay | Admitting: Psychiatry

## 2023-12-28 ENCOUNTER — Other Ambulatory Visit (HOSPITAL_COMMUNITY): Payer: Self-pay | Admitting: Psychiatry

## 2024-01-27 ENCOUNTER — Other Ambulatory Visit: Payer: Self-pay | Admitting: Psychiatry

## 2024-01-27 NOTE — Telephone Encounter (Signed)
 Please notify the pharmacy that she is no longer under my care. Please advise not to send refill request.

## 2024-02-18 ENCOUNTER — Other Ambulatory Visit: Payer: Self-pay | Admitting: Student

## 2024-02-18 DIAGNOSIS — I5020 Unspecified systolic (congestive) heart failure: Secondary | ICD-10-CM

## 2024-03-07 ENCOUNTER — Other Ambulatory Visit: Payer: Self-pay

## 2024-03-08 ENCOUNTER — Other Ambulatory Visit: Payer: Self-pay

## 2024-03-29 ENCOUNTER — Inpatient Hospital Stay: Attending: Oncology

## 2024-03-29 DIAGNOSIS — D500 Iron deficiency anemia secondary to blood loss (chronic): Secondary | ICD-10-CM

## 2024-03-29 DIAGNOSIS — D509 Iron deficiency anemia, unspecified: Secondary | ICD-10-CM | POA: Insufficient documentation

## 2024-03-29 LAB — CBC WITH DIFFERENTIAL (CANCER CENTER ONLY)
Abs Immature Granulocytes: 0.03 K/uL (ref 0.00–0.07)
Basophils Absolute: 0 K/uL (ref 0.0–0.1)
Basophils Relative: 1 %
Eosinophils Absolute: 0.2 K/uL (ref 0.0–0.5)
Eosinophils Relative: 2 %
HCT: 42 % (ref 36.0–46.0)
Hemoglobin: 13.2 g/dL (ref 12.0–15.0)
Immature Granulocytes: 1 %
Lymphocytes Relative: 11 %
Lymphs Abs: 0.7 K/uL (ref 0.7–4.0)
MCH: 33.2 pg (ref 26.0–34.0)
MCHC: 31.4 g/dL (ref 30.0–36.0)
MCV: 105.5 fL — ABNORMAL HIGH (ref 80.0–100.0)
Monocytes Absolute: 0.6 K/uL (ref 0.1–1.0)
Monocytes Relative: 9 %
Neutro Abs: 4.8 K/uL (ref 1.7–7.7)
Neutrophils Relative %: 76 %
Platelet Count: 215 K/uL (ref 150–400)
RBC: 3.98 MIL/uL (ref 3.87–5.11)
RDW: 13.9 % (ref 11.5–15.5)
WBC Count: 6.3 K/uL (ref 4.0–10.5)
nRBC: 0 % (ref 0.0–0.2)

## 2024-03-29 LAB — IRON AND TIBC
Iron: 87 ug/dL (ref 28–170)
Saturation Ratios: 27 % (ref 10.4–31.8)
TIBC: 326 ug/dL (ref 250–450)
UIBC: 239 ug/dL

## 2024-03-29 LAB — RETIC PANEL
Immature Retic Fract: 13.7 % (ref 2.3–15.9)
RBC.: 3.96 MIL/uL (ref 3.87–5.11)
Retic Count, Absolute: 91.1 K/uL (ref 19.0–186.0)
Retic Ct Pct: 2.3 % (ref 0.4–3.1)
Reticulocyte Hemoglobin: 33.4 pg

## 2024-03-29 LAB — FERRITIN: Ferritin: 52 ng/mL (ref 11–307)

## 2024-04-05 ENCOUNTER — Encounter: Payer: Self-pay | Admitting: Oncology

## 2024-04-05 ENCOUNTER — Inpatient Hospital Stay: Attending: Oncology | Admitting: Oncology

## 2024-04-05 ENCOUNTER — Inpatient Hospital Stay

## 2024-04-05 VITALS — BP 112/79 | HR 84 | Temp 98.8°F | Resp 18

## 2024-04-05 DIAGNOSIS — J439 Emphysema, unspecified: Secondary | ICD-10-CM | POA: Diagnosis not present

## 2024-04-05 DIAGNOSIS — E119 Type 2 diabetes mellitus without complications: Secondary | ICD-10-CM | POA: Diagnosis not present

## 2024-04-05 DIAGNOSIS — F32A Depression, unspecified: Secondary | ICD-10-CM | POA: Diagnosis not present

## 2024-04-05 DIAGNOSIS — G809 Cerebral palsy, unspecified: Secondary | ICD-10-CM | POA: Diagnosis not present

## 2024-04-05 DIAGNOSIS — Z79890 Hormone replacement therapy (postmenopausal): Secondary | ICD-10-CM | POA: Diagnosis not present

## 2024-04-05 DIAGNOSIS — Z87891 Personal history of nicotine dependence: Secondary | ICD-10-CM | POA: Insufficient documentation

## 2024-04-05 DIAGNOSIS — D508 Other iron deficiency anemias: Secondary | ICD-10-CM | POA: Insufficient documentation

## 2024-04-05 DIAGNOSIS — H409 Unspecified glaucoma: Secondary | ICD-10-CM | POA: Insufficient documentation

## 2024-04-05 DIAGNOSIS — Z803 Family history of malignant neoplasm of breast: Secondary | ICD-10-CM | POA: Insufficient documentation

## 2024-04-05 DIAGNOSIS — D500 Iron deficiency anemia secondary to blood loss (chronic): Secondary | ICD-10-CM | POA: Diagnosis not present

## 2024-04-05 DIAGNOSIS — K449 Diaphragmatic hernia without obstruction or gangrene: Secondary | ICD-10-CM | POA: Diagnosis not present

## 2024-04-05 DIAGNOSIS — E039 Hypothyroidism, unspecified: Secondary | ICD-10-CM | POA: Insufficient documentation

## 2024-04-05 DIAGNOSIS — Z7984 Long term (current) use of oral hypoglycemic drugs: Secondary | ICD-10-CM | POA: Diagnosis not present

## 2024-04-05 DIAGNOSIS — E785 Hyperlipidemia, unspecified: Secondary | ICD-10-CM | POA: Diagnosis not present

## 2024-04-05 DIAGNOSIS — Z79899 Other long term (current) drug therapy: Secondary | ICD-10-CM | POA: Insufficient documentation

## 2024-04-05 DIAGNOSIS — G40909 Epilepsy, unspecified, not intractable, without status epilepticus: Secondary | ICD-10-CM | POA: Insufficient documentation

## 2024-04-05 DIAGNOSIS — Z7983 Long term (current) use of bisphosphonates: Secondary | ICD-10-CM | POA: Diagnosis not present

## 2024-04-05 NOTE — Assessment & Plan Note (Addendum)
 Lab Results  Component Value Date   HGB 13.2 03/29/2024   TIBC 326 03/29/2024   IRONPCTSAT 27 03/29/2024   FERRITIN 52 03/29/2024   Stable and normal hemoglobin and iron  panel. Hold Venofer .

## 2024-04-05 NOTE — Progress Notes (Signed)
 " Hematology/Oncology Progress note [phone] [phone]        CHIEF COMPLAINTS/REASON FOR VISIT:  IDA  ASSESSMENT & PLAN:   Iron  deficiency anemia due to chronic blood loss Lab Results  Component Value Date   HGB 13.2 03/29/2024   TIBC 326 03/29/2024   IRONPCTSAT 27 03/29/2024   FERRITIN 52 03/29/2024   Stable and normal hemoglobin and iron  panel. Hold Venofer .     Orders Placed This Encounter  Procedures   CBC with Differential (Cancer Center Only)    Standing Status:   Future    Expected Date:   10/03/2024    Expiration Date:   01/01/2025   Iron  and TIBC    Standing Status:   Future    Expected Date:   10/03/2024    Expiration Date:   01/01/2025   Ferritin    Standing Status:   Future    Expected Date:   10/03/2024    Expiration Date:   01/01/2025   Retic Panel    Standing Status:   Future    Expected Date:   10/03/2024    Expiration Date:   01/01/2025   Vitamin B12    Standing Status:   Future    Expected Date:   10/03/2024    Expiration Date:   01/01/2025   Folate    Standing Status:   Future    Expected Date:   10/03/2024    Expiration Date:   01/01/2025   Follow-up 6 months.  All questions were answered. The patient knows to call the clinic with any problems, questions or concerns.  Michele Cap, MD, PhD [facility] H[facility] Oncology 04/05/2024   HISTORY OF PRESENTING ILLNESS:   Patient has a history of seizure disorder/history of cerebral palsy Patient is a poor historian.  She was accompanied by her mother who has hearing deficiency and not able to provide much history.[name]   History was mainly obtained from reviewing medical records.  06/14/2020, CT chest with contrast showed a lobulated nodule with mildly spiculated margin in the posterior right chest persist and is suspicious for bronchogenic neoplasm based on morphologic repeat years Nodule along the minor fissure in the right chest measures 6 mm, nonspecific.  Moderately large hiatal  hernia 11 mm distal common bile duct, previously 8 to 9 mm.  No pancreatic duct dilatation is visualized.  Prominence of the common bile duct on the prior study in this patient's post cholecystectomy.  Aortic atherosclerosis.  Patient was referred to establish care with oncology for further evaluation of the lung nodule.  She denies any cough, shortness of breath, hemoptysis, patient is a former smoker, and quit in 1982.  She reports only smoked for 4 months and mostly on weekends.  Patient has contractures and physical deformities which severely limits her  mobility.  She lives at home with parents.   Lung nodule  07/02/2020 no activity on PET scan. 01/04/2021, CT chest without contrast showed stable size of the lung nodule.  Likely benign. 01/07/22 CT chest without contrast showed showed stable/slightly smaller 1.4 cm posterior right lower lobe pulmonary nodule.  Likely benign.  New right upper lobe groundglass nodule with patchy groundglass opacity.   07/17/2022 CT chest wo contrast showed  1. No suspicious pulmonary nodules. Previously demonstrated nodules are stable to decreased in size, consistent with benign findings. 2. Patchy ground-glass opacities in both lungs have mildly progressed posteriorly in the right upper lobe, but are non focal  and likely inflammatory/postinflammatory. 3. Moderate  to large hiatal hernia, mildly increased in size from previous imaging. 4. No adenopathy or pleural effusion. 5. Aortic Atherosclerosis (ICD10-I70.0) and Emphysema   04/13/22 EGD showed gastritis, 04/21/22 colonoscopy negative. Small bowel enteroscopy negative.   INTERVAL HISTORY [facility] is a 69 y.o. female who has above history reviewed by me today presents for acute visit for anemia   Today she was accompanied by her father. She is a poor historian.  Per father, patient was not able to complete colonoscopy. Denies blood in stool, abdominal pain, weight loss.  She has been off on iron   supplementation since July 2025.  Review of Systems  Unable to perform ROS: Other (Poor historian, noncoherent)  Constitutional:  Positive for fatigue. Negative for chills and fever.  Respiratory:  Negative for cough.   Gastrointestinal:  Negative for abdominal distention.  Skin:  Negative for itching and rash.    MEDICAL HISTORY:  Past Medical History:  Diagnosis Date   Anemia    Arthritis    knees   Cerebral palsy (HCC)    Depression    Diabetes mellitus without complication (HCC)    Diverticulosis    Dysphagia    Eczema    GERD (gastroesophageal reflux disease)    Glaucoma    Hemorrhoids    Hyperlipidemia    Hypothyroidism    Seizures (HCC)    none in over 10 yrs    SURGICAL HISTORY: Past Surgical History:  Procedure Laterality Date   BREAST BIOPSY Right 03/22/2020   stereo bx, ribbon clip,  FAT NECROSIS AND FIBROSIS   CATARACT EXTRACTION W/PHACO Right 02/06/2016   Procedure: CATARACT EXTRACTION PHACO AND INTRAOCULAR LENS PLACEMENT (IOC);  Surgeon: Dene Etienne, MD;  Location: [facility];  Service: Ophthalmology;  Laterality: Right;   CATARACT EXTRACTION W/PHACO Left 03/05/2016   Procedure: CATARACT EXTRACTION PHACO AND INTRAOCULAR LENS PLACEMENT (IOC);  Surgeon: Dene Etienne, MD;  Location: [facility];  Service: Ophthalmology;  Laterality: Left;   CHOLECYSTECTOMY N/A 11/16/2015   Procedure: LAPAROSCOPIC CHOLECYSTECTOMY;  Surgeon: Elgin Laurence III, MD;  Location: [facility] ORS;  Service: General;  Laterality: N/A;  attempted cholangiogram   COLONOSCOPY WITH PROPOFOL  N/A 12/25/2014   Procedure: COLONOSCOPY WITH PROPOFOL ;  Surgeon: Deward CINDERELLA Piedmont, MD;  Location: [facility] ENDOSCOPY;  Service: Gastroenterology;  Laterality: N/A;   COLONOSCOPY WITH PROPOFOL  N/A 04/14/2019   Procedure: COLONOSCOPY WITH PROPOFOL ;  Surgeon: Toledo, Ladell POUR, MD;  Location: [facility] ENDOSCOPY;  Service: Gastroenterology;  Laterality: N/A;   COLONOSCOPY WITH PROPOFOL  N/A 04/20/2022    Procedure: COLONOSCOPY WITH PROPOFOL ;  Surgeon: Therisa Bi, MD;  Location: [facility] ENDOSCOPY;  Service: Gastroenterology;  Laterality: N/A;   ENDOSCOPIC RETROGRADE CHOLANGIOPANCREATOGRAPHY (ERCP) WITH PROPOFOL  N/A 11/18/2015   Procedure: ENDOSCOPIC RETROGRADE CHOLANGIOPANCREATOGRAPHY (ERCP) WITH PROPOFOL ;  Surgeon: Rogelia Copping, MD;  Location: [facility] ENDOSCOPY;  Service: Endoscopy;  Laterality: N/A;   ESOPHAGOGASTRODUODENOSCOPY (EGD) WITH PROPOFOL  N/A 04/14/2019   Procedure: ESOPHAGOGASTRODUODENOSCOPY (EGD) WITH PROPOFOL ;  Surgeon: Toledo, Ladell POUR, MD;  Location: [facility] ENDOSCOPY;  Service: Gastroenterology;  Laterality: N/A;   ESOPHAGOGASTRODUODENOSCOPY (EGD) WITH PROPOFOL  N/A 04/20/2022   Procedure: ESOPHAGOGASTRODUODENOSCOPY (EGD) WITH PROPOFOL ;  Surgeon: Therisa Bi, MD;  Location: [facility] ENDOSCOPY;  Service: Gastroenterology;  Laterality: N/A;   EYE SURGERY     Cataract Surgery    HERNIA REPAIR      SOCIAL HISTORY: Social History   Socioeconomic History   Marital status: Single    Spouse name: Not on file   Number of children: Not on file   Years  of education: Not on file   Highest education level: Not on file  Occupational History   Not on file  Tobacco Use   Smoking status: Former    Current packs/day: 0.00    Types: Cigarettes    Quit date: 06/20/1980    Years since quitting: 43.8   Smokeless tobacco: Never   Tobacco comments:    smoked socially in early 1980s (reports for 4 months)  Vaping Use   Vaping status: Never Used  Substance and Sexual Activity   Alcohol  use: No   Drug use: No   Sexual activity: Not Currently  Other Topics Concern   Not on file  Social History Narrative   Not on file   Social Drivers of Health   Tobacco Use: Medium Risk (04/05/2024)   Patient History    Smoking Tobacco Use: Former    Smokeless Tobacco Use: Never    Passive Exposure: Not on Actuary Strain: Low Risk (09/28/2023)   Overall Financial Resource Strain (CARDIA)     Difficulty of Paying Living Expenses: Not hard at all  Food Insecurity: No Food Insecurity (09/28/2023)   Epic    Worried About Radiation Protection Practitioner of Food in the Last Year: Never true    Ran Out of Food in the Last Year: Never true  Transportation Needs: No Transportation Needs (09/28/2023)   Epic    Lack of Transportation (Medical): No    Lack of Transportation (Non-Medical): No  Physical Activity: Not on file  Stress: Not on file  Social Connections: Unknown (09/26/2023)   Social Connection and Isolation Panel    Frequency of Communication with Friends and Family: Patient unable to answer    Frequency of Social Gatherings with Friends and Family: Patient unable to answer    Attends Religious Services: Patient unable to answer    Active Member of Clubs or Organizations: Patient unable to answer    Attends Banker Meetings: Patient unable to answer    Marital Status: Never married  Intimate Partner Violence: Patient Unable To Answer (09/26/2023)   Epic    Fear of Current or Ex-Partner: Patient unable to answer    Emotionally Abused: Patient unable to answer    Physically Abused: Patient unable to answer    Sexually Abused: Patient unable to answer  Depression (PHQ2-9): Low Risk (02/03/2022)   Depression (PHQ2-9)    PHQ-2 Score: 0  Alcohol  Screen: Not on file  Housing: Unknown (09/28/2023)   Epic    Unable to Pay for Housing in the Last Year: No    Number of Times Moved in the Last Year: Not on file    Homeless in the Last Year: No  Utilities: Patient Unable To Answer (02/06/2023)   [employer] Utilities    Threatened with loss of utilities: Patient unable to answer  Health Literacy: Not on file    FAMILY HISTORY: Family History  Problem Relation Age of Onset   High blood pressure Mother    Hyperlipidemia Mother    Diabetes Mellitus II Mother    Stroke Mother    Osteoporosis Mother    High blood pressure Father    Breast cancer Maternal Aunt    Stroke Maternal Grandfather     Diabetes Mellitus II Maternal Grandfather    Stroke Maternal Grandmother    Diabetes Mellitus II Maternal Grandmother     ALLERGIES:  is allergic to aminophylline, cinnamon, rice, amoxicillin , latex, nsaids, penicillins, potassium, potassium-containing compounds, septra [sulfamethoxazole-trimethoprim], sulfa antibiotics, sulfasalazine, tape,  and theophyllines.  MEDICATIONS:  Current Outpatient Medications  Medication Sig Dispense Refill   acetaminophen  (TYLENOL ) 325 MG tablet Take 325 mg by mouth every 6 (six) hours as needed.     alendronate  (FOSAMAX ) 70 MG tablet Take 70 mg by mouth once a week. Take with a full glass of water on an empty stomach.     ALPRAZolam  (XANAX ) 0.25 MG tablet Take 0.25 mg by mouth as needed for anxiety. Before pap smears     atorvastatin  (LIPITOR) 40 MG tablet Take 40 mg by mouth daily.     calcium  citrate-vitamin D  (CITRACAL+D) 315-200 MG-UNIT per tablet Take 1 tablet by mouth 2 (two) times daily.     carbamazepine  (TEGRETOL ) 200 MG tablet Take 200 mg by mouth 3 (three) times daily.     Cholecalciferol  100 MCG (4000 UT) TABS Take 1,000 Units by mouth.     empagliflozin (JARDIANCE) 10 MG TABS tablet Take 10 mg by mouth daily.     esomeprazole (NEXIUM) 40 MG capsule Take 40 mg by mouth daily at 12 noon.     famotidine  (PEPCID ) 20 MG tablet Take 20 mg by mouth 2 (two) times daily.     furosemide  (LASIX ) 20 MG tablet Take 2 tablets (40 mg total) by mouth daily. 180 tablet 1   hydroxypropyl methylcellulose / hypromellose (ISOPTO TEARS / GONIOVISC) 2.5 % ophthalmic solution 1 drop as needed for dry eyes.     levothyroxine  (SYNTHROID ) 100 MCG tablet Take 100 mcg by mouth every morning.     losartan  (COZAAR ) 25 MG tablet Take by mouth.     magnesium  oxide (MAG-OX) 400 (240 Mg) MG tablet Take 1 tablet by mouth 3 (three) times daily.     metoprolol  succinate (TOPROL -XL) 25 MG 24 hr tablet Take 25 mg by mouth daily.     OLANZapine  (ZYPREXA ) 10 MG tablet Take 1 tablet (10  mg total) by mouth at bedtime. (Patient taking differently: Take 10 mg by mouth at bedtime. Take 10 mg in addition to 15 mg tablet for total 25 mg at bedtime) 90 tablet 0   OLANZapine  (ZYPREXA ) 15 MG tablet Take 1 tablet (15 mg total) by mouth at bedtime. (Patient taking differently: Take 15 mg by mouth at bedtime. Take 15 mg in addition to one 10 mg tablet for total 25 mg at bedtime) 90 tablet 0   OLANZapine  (ZYPREXA ) 5 MG tablet Take 1 tablet (5 mg total) by mouth daily. 90 tablet 0   PARoxetine  (PAXIL ) 20 MG tablet TAKE 1 TABLET BY MOUTH EVERY DAY 90 tablet 1   potassium chloride  20 MEQ/15ML (10%) SOLN Take 10 mEq by mouth daily.     vitamin E 400 UNIT capsule Take 400 Units by mouth daily.     iron  polysaccharides (NIFEREX) 150 MG capsule Take 1 capsule (150 mg total) by mouth 2 (two) times daily for 30 days, THEN 1 capsule (150 mg total) daily. (Patient not taking: No sig reported) 150 capsule 0   No current facility-administered medications for this visit.     PHYSICAL EXAMINATION: ECOG PERFORMANCE STATUS: 2 - Symptomatic, <50% confined to bed Vitals:   04/05/24 1057  BP: 112/79  Pulse: 84  Resp: 18  Temp: 98.8 F (37.1 C)  SpO2: (!) 89%   There were no vitals filed for this visit.    Physical Exam Constitutional:      General: She is not in acute distress.    Comments: Patient sits in the wheelchair  HENT:  Head: Normocephalic and atraumatic.  Eyes:     General: No scleral icterus. Cardiovascular:     Rate and Rhythm: Normal rate and regular rhythm.     Heart sounds: Normal heart sounds.  Pulmonary:     Effort: Pulmonary effort is normal. No respiratory distress.     Breath sounds: No wheezing.  Abdominal:     General: Bowel sounds are normal. There is no distension.     Palpations: Abdomen is soft.  Musculoskeletal:        General: Deformity present.     Cervical back: Normal range of motion and neck supple.     Comments: Contracture of left upper extremity  in the left lower extremity.  Skin:    General: Skin is warm and dry.     Coloration: Skin is pale.     Findings: No erythema or rash.  Neurological:     Mental Status: She is alert. Mental status is at baseline.     LABORATORY DATA:  I have reviewed the data as listed    Latest Ref Rng & Units 03/29/2024   10:09 AM 10/02/2023    3:40 AM 10/01/2023    5:50 AM  CBC  WBC 4.0 - 10.5 K/uL 6.3  9.2  8.1   Hemoglobin 12.0 - 15.0 g/dL 86.7  88.1  88.4   Hematocrit 36.0 - 46.0 % 42.0  39.1  36.8   Platelets 150 - 400 K/uL 215  239  236       Latest Ref Rng & Units 10/05/2023    4:50 AM 10/03/2023    5:24 AM 10/02/2023    3:40 AM  CMP  Glucose 70 - 99 mg/dL 893  877  873   BUN 8 - 23 mg/dL 23  16  16    Creatinine 0.44 - 1.00 mg/dL 9.49  9.55  9.58   Sodium 135 - 145 mmol/L 136  137  138   Potassium 3.5 - 5.1 mmol/L 3.5  3.6  3.8   Chloride 98 - 111 mmol/L 86  88  93   CO2 22 - 32 mmol/L 43  36  36   Calcium  8.9 - 10.3 mg/dL 8.8  8.5  8.7       RADIOGRAPHIC STUDIES: I have personally reviewed the radiological images as listed and agreed with the findings in the report. No results found.     "

## 2024-04-20 ENCOUNTER — Encounter: Payer: Self-pay | Admitting: Oncology

## 2024-10-04 ENCOUNTER — Inpatient Hospital Stay

## 2024-10-11 ENCOUNTER — Inpatient Hospital Stay: Admitting: Oncology

## 2024-10-11 ENCOUNTER — Inpatient Hospital Stay
